# Patient Record
Sex: Female | Born: 1970 | Race: Black or African American | Hispanic: No | Marital: Single | Smoking: Never smoker
Health system: Southern US, Community
[De-identification: ages and names within clinical notes are randomized; demographics above are authoritative.]

## PROBLEM LIST (undated history)

## (undated) DIAGNOSIS — N1831 Chronic kidney disease, stage 3a: Secondary | ICD-10-CM

## (undated) DIAGNOSIS — Z973 Presence of spectacles and contact lenses: Secondary | ICD-10-CM

## (undated) DIAGNOSIS — I499 Cardiac arrhythmia, unspecified: Secondary | ICD-10-CM

## (undated) DIAGNOSIS — D573 Sickle-cell trait: Secondary | ICD-10-CM

## (undated) DIAGNOSIS — Z8489 Family history of other specified conditions: Secondary | ICD-10-CM

## (undated) DIAGNOSIS — R7303 Prediabetes: Secondary | ICD-10-CM

## (undated) DIAGNOSIS — E782 Mixed hyperlipidemia: Secondary | ICD-10-CM

## (undated) DIAGNOSIS — E669 Obesity, unspecified: Secondary | ICD-10-CM

## (undated) DIAGNOSIS — D649 Anemia, unspecified: Secondary | ICD-10-CM

## (undated) DIAGNOSIS — G4730 Sleep apnea, unspecified: Secondary | ICD-10-CM

## (undated) DIAGNOSIS — M7989 Other specified soft tissue disorders: Secondary | ICD-10-CM

## (undated) DIAGNOSIS — E559 Vitamin D deficiency, unspecified: Secondary | ICD-10-CM

## (undated) DIAGNOSIS — J309 Allergic rhinitis, unspecified: Secondary | ICD-10-CM

## (undated) DIAGNOSIS — M2550 Pain in unspecified joint: Secondary | ICD-10-CM

## (undated) DIAGNOSIS — L719 Rosacea, unspecified: Secondary | ICD-10-CM

## (undated) DIAGNOSIS — Z91018 Allergy to other foods: Secondary | ICD-10-CM

## (undated) DIAGNOSIS — L501 Idiopathic urticaria: Secondary | ICD-10-CM

## (undated) DIAGNOSIS — T7840XA Allergy, unspecified, initial encounter: Secondary | ICD-10-CM

## (undated) DIAGNOSIS — K760 Fatty (change of) liver, not elsewhere classified: Secondary | ICD-10-CM

## (undated) DIAGNOSIS — I4892 Unspecified atrial flutter: Secondary | ICD-10-CM

## (undated) DIAGNOSIS — B20 Human immunodeficiency virus [HIV] disease: Secondary | ICD-10-CM

## (undated) DIAGNOSIS — D571 Sickle-cell disease without crisis: Secondary | ICD-10-CM

## (undated) DIAGNOSIS — I471 Supraventricular tachycardia, unspecified: Secondary | ICD-10-CM

## (undated) DIAGNOSIS — M899 Disorder of bone, unspecified: Secondary | ICD-10-CM

## (undated) HISTORY — DX: Prediabetes: R73.03

## (undated) HISTORY — DX: Disorder of bone, unspecified: M89.9

## (undated) HISTORY — DX: Other specified soft tissue disorders: M79.89

## (undated) HISTORY — DX: Supraventricular tachycardia, unspecified: I47.10

## (undated) HISTORY — DX: Rosacea, unspecified: L71.9

## (undated) HISTORY — DX: Pain in unspecified joint: M25.50

## (undated) HISTORY — DX: Obesity, unspecified: E66.9

## (undated) HISTORY — DX: Allergy, unspecified, initial encounter: T78.40XA

## (undated) HISTORY — DX: Allergic rhinitis, unspecified: J30.9

## (undated) HISTORY — DX: Allergy to other foods: Z91.018

## (undated) HISTORY — DX: Sickle-cell disease without crisis: D57.1

## (undated) HISTORY — DX: Anemia, unspecified: D64.9

## (undated) HISTORY — DX: Vitamin D deficiency, unspecified: E55.9

## (undated) HISTORY — DX: Idiopathic urticaria: L50.1

## (undated) HISTORY — DX: Human immunodeficiency virus (HIV) disease: B20

## (undated) HISTORY — PX: APPENDECTOMY: SHX54

---

## 1999-06-29 ENCOUNTER — Other Ambulatory Visit: Admission: RE | Admit: 1999-06-29 | Discharge: 1999-06-29 | Payer: Self-pay | Admitting: *Deleted

## 2000-09-18 ENCOUNTER — Other Ambulatory Visit: Admission: RE | Admit: 2000-09-18 | Discharge: 2000-09-18 | Payer: Self-pay | Admitting: *Deleted

## 2002-03-09 ENCOUNTER — Other Ambulatory Visit: Admission: RE | Admit: 2002-03-09 | Discharge: 2002-03-09 | Payer: Self-pay | Admitting: *Deleted

## 2003-10-13 ENCOUNTER — Other Ambulatory Visit: Admission: RE | Admit: 2003-10-13 | Discharge: 2003-10-13 | Payer: Self-pay | Admitting: *Deleted

## 2004-08-08 ENCOUNTER — Other Ambulatory Visit: Admission: RE | Admit: 2004-08-08 | Discharge: 2004-08-08 | Payer: Self-pay | Admitting: Family Medicine

## 2004-08-14 ENCOUNTER — Encounter: Admission: RE | Admit: 2004-08-14 | Discharge: 2004-08-14 | Payer: Self-pay | Admitting: Family Medicine

## 2004-08-14 ENCOUNTER — Encounter (INDEPENDENT_AMBULATORY_CARE_PROVIDER_SITE_OTHER): Payer: Self-pay | Admitting: *Deleted

## 2004-08-14 LAB — CONVERTED CEMR LAB: CD4 T Cell Abs: 19

## 2004-08-23 ENCOUNTER — Encounter: Admission: RE | Admit: 2004-08-23 | Discharge: 2004-08-23 | Payer: Self-pay | Admitting: Internal Medicine

## 2004-08-31 ENCOUNTER — Ambulatory Visit: Payer: Self-pay | Admitting: Internal Medicine

## 2004-10-03 ENCOUNTER — Ambulatory Visit: Payer: Self-pay | Admitting: Internal Medicine

## 2004-10-16 ENCOUNTER — Ambulatory Visit: Payer: Self-pay | Admitting: Internal Medicine

## 2004-10-24 ENCOUNTER — Ambulatory Visit: Payer: Self-pay | Admitting: Internal Medicine

## 2004-10-30 ENCOUNTER — Ambulatory Visit: Payer: Self-pay | Admitting: Internal Medicine

## 2004-11-13 ENCOUNTER — Ambulatory Visit: Payer: Self-pay | Admitting: Internal Medicine

## 2004-12-05 ENCOUNTER — Ambulatory Visit: Payer: Self-pay | Admitting: Internal Medicine

## 2004-12-11 ENCOUNTER — Ambulatory Visit: Payer: Self-pay | Admitting: Internal Medicine

## 2004-12-31 DIAGNOSIS — B20 Human immunodeficiency virus [HIV] disease: Secondary | ICD-10-CM

## 2004-12-31 DIAGNOSIS — Z21 Asymptomatic human immunodeficiency virus [HIV] infection status: Secondary | ICD-10-CM

## 2004-12-31 HISTORY — DX: Human immunodeficiency virus (HIV) disease: B20

## 2004-12-31 HISTORY — DX: Asymptomatic human immunodeficiency virus (hiv) infection status: Z21

## 2005-02-05 ENCOUNTER — Ambulatory Visit: Payer: Self-pay | Admitting: Internal Medicine

## 2005-04-02 ENCOUNTER — Ambulatory Visit: Payer: Self-pay | Admitting: Internal Medicine

## 2005-05-08 ENCOUNTER — Ambulatory Visit: Payer: Self-pay | Admitting: Internal Medicine

## 2005-06-11 ENCOUNTER — Ambulatory Visit: Payer: Self-pay | Admitting: Internal Medicine

## 2005-09-11 ENCOUNTER — Ambulatory Visit: Payer: Self-pay | Admitting: Internal Medicine

## 2005-11-13 ENCOUNTER — Ambulatory Visit: Payer: Self-pay | Admitting: Internal Medicine

## 2005-11-21 ENCOUNTER — Other Ambulatory Visit: Admission: RE | Admit: 2005-11-21 | Discharge: 2005-11-21 | Payer: Self-pay | Admitting: Family Medicine

## 2005-12-10 ENCOUNTER — Ambulatory Visit: Payer: Self-pay | Admitting: Infectious Diseases

## 2006-03-04 ENCOUNTER — Ambulatory Visit: Payer: Self-pay | Admitting: Internal Medicine

## 2006-06-04 ENCOUNTER — Ambulatory Visit: Payer: Self-pay | Admitting: Internal Medicine

## 2006-07-08 ENCOUNTER — Ambulatory Visit: Payer: Self-pay | Admitting: Internal Medicine

## 2006-08-15 ENCOUNTER — Ambulatory Visit: Payer: Self-pay | Admitting: Internal Medicine

## 2006-08-15 ENCOUNTER — Encounter (INDEPENDENT_AMBULATORY_CARE_PROVIDER_SITE_OTHER): Payer: Self-pay | Admitting: *Deleted

## 2006-11-11 ENCOUNTER — Encounter (INDEPENDENT_AMBULATORY_CARE_PROVIDER_SITE_OTHER): Payer: Self-pay | Admitting: *Deleted

## 2006-11-11 ENCOUNTER — Ambulatory Visit: Payer: Self-pay | Admitting: Internal Medicine

## 2006-11-11 LAB — CONVERTED CEMR LAB
AST: 14 units/L (ref 0–37)
Alkaline Phosphatase: 95 units/L (ref 39–117)
Bilirubin, Direct: <0.1 mg/dL (ref 0.0–0.3)
Calcium: 8.7 mg/dL (ref 8.4–10.5)
Chloride: 108 meq/L (ref 96–112)
Creatinine, Ser: 1.01 mg/dL (ref 0.40–1.20)
HIV 1 RNA Quant: 49 copies/mL
LDL Cholesterol: 109 mg/dL — ABNORMAL HIGH (ref 0–99)
Potassium: 4.6 meq/L (ref 3.5–5.3)
Total Bilirubin: 0.3 mg/dL (ref 0.3–1.2)
Total CHOL/HDL Ratio: 5.6
Triglycerides: 235 mg/dL — ABNORMAL HIGH (ref <150)

## 2007-01-24 ENCOUNTER — Encounter (INDEPENDENT_AMBULATORY_CARE_PROVIDER_SITE_OTHER): Payer: Self-pay | Admitting: *Deleted

## 2007-01-24 ENCOUNTER — Ambulatory Visit: Payer: Self-pay | Admitting: Internal Medicine

## 2007-01-24 DIAGNOSIS — B20 Human immunodeficiency virus [HIV] disease: Secondary | ICD-10-CM | POA: Insufficient documentation

## 2007-01-24 DIAGNOSIS — B370 Candidal stomatitis: Secondary | ICD-10-CM | POA: Insufficient documentation

## 2007-01-24 LAB — CONVERTED CEMR LAB
ALT: 10 units/L (ref 0–35)
AST: 14 units/L (ref 0–37)
Bilirubin, Direct: 0.1 mg/dL (ref 0.0–0.3)
CD4 Count: 342 microliters
Chloride: 104 meq/L (ref 96–112)
Hemoglobin, Urine: NEGATIVE
Indirect Bilirubin: 0.3 mg/dL (ref 0.0–0.9)
Ketones, ur: NEGATIVE mg/dL
Leukocytes, UA: NEGATIVE
Nitrite: NEGATIVE
Phosphorus: 2.9 mg/dL (ref 2.3–4.6)
Sodium: 137 meq/L (ref 135–145)
Specific Gravity, Urine: 1.021 (ref 1.005–1.03)
Total CHOL/HDL Ratio: 5.1
Urine Glucose: NEGATIVE mg/dL
Urobilinogen, UA: 0.2 (ref 0.0–1.0)

## 2007-02-11 ENCOUNTER — Encounter: Payer: Self-pay | Admitting: Internal Medicine

## 2007-02-24 ENCOUNTER — Encounter (INDEPENDENT_AMBULATORY_CARE_PROVIDER_SITE_OTHER): Payer: Self-pay | Admitting: *Deleted

## 2007-02-24 LAB — CONVERTED CEMR LAB

## 2007-03-06 ENCOUNTER — Ambulatory Visit: Payer: Self-pay | Admitting: Internal Medicine

## 2007-03-06 DIAGNOSIS — L501 Idiopathic urticaria: Secondary | ICD-10-CM | POA: Insufficient documentation

## 2007-03-09 ENCOUNTER — Encounter (INDEPENDENT_AMBULATORY_CARE_PROVIDER_SITE_OTHER): Payer: Self-pay | Admitting: *Deleted

## 2007-04-28 ENCOUNTER — Ambulatory Visit: Payer: Self-pay | Admitting: Internal Medicine

## 2007-05-07 ENCOUNTER — Other Ambulatory Visit: Admission: RE | Admit: 2007-05-07 | Discharge: 2007-05-07 | Payer: Self-pay | Admitting: Family Medicine

## 2007-06-11 ENCOUNTER — Encounter: Payer: Self-pay | Admitting: Internal Medicine

## 2007-07-15 ENCOUNTER — Ambulatory Visit: Payer: Self-pay | Admitting: Internal Medicine

## 2007-09-29 ENCOUNTER — Telehealth (INDEPENDENT_AMBULATORY_CARE_PROVIDER_SITE_OTHER): Payer: Self-pay | Admitting: *Deleted

## 2007-10-02 ENCOUNTER — Ambulatory Visit: Payer: Self-pay | Admitting: Internal Medicine

## 2007-10-02 LAB — CONVERTED CEMR LAB

## 2007-10-03 ENCOUNTER — Encounter: Payer: Self-pay | Admitting: Internal Medicine

## 2007-10-15 ENCOUNTER — Encounter: Payer: Self-pay | Admitting: Internal Medicine

## 2007-10-15 LAB — CONVERTED CEMR LAB
CD4 Count: 380 microliters
HIV 1 RNA Quant: 49 copies/mL

## 2007-11-06 ENCOUNTER — Encounter (INDEPENDENT_AMBULATORY_CARE_PROVIDER_SITE_OTHER): Payer: Self-pay | Admitting: *Deleted

## 2007-11-06 ENCOUNTER — Ambulatory Visit: Payer: Self-pay | Admitting: Internal Medicine

## 2007-11-06 DIAGNOSIS — Z6841 Body Mass Index (BMI) 40.0 and over, adult: Secondary | ICD-10-CM | POA: Insufficient documentation

## 2007-11-06 DIAGNOSIS — E7849 Other hyperlipidemia: Secondary | ICD-10-CM | POA: Insufficient documentation

## 2007-11-06 DIAGNOSIS — E669 Obesity, unspecified: Secondary | ICD-10-CM

## 2007-11-06 DIAGNOSIS — E785 Hyperlipidemia, unspecified: Secondary | ICD-10-CM

## 2007-12-30 ENCOUNTER — Ambulatory Visit: Payer: Self-pay | Admitting: Internal Medicine

## 2007-12-30 ENCOUNTER — Encounter (INDEPENDENT_AMBULATORY_CARE_PROVIDER_SITE_OTHER): Payer: Self-pay | Admitting: *Deleted

## 2008-03-30 ENCOUNTER — Ambulatory Visit: Payer: Self-pay | Admitting: Internal Medicine

## 2008-05-04 ENCOUNTER — Ambulatory Visit: Payer: Self-pay | Admitting: Internal Medicine

## 2008-05-04 ENCOUNTER — Ambulatory Visit (HOSPITAL_COMMUNITY): Admission: RE | Admit: 2008-05-04 | Discharge: 2008-05-04 | Payer: Self-pay | Admitting: Internal Medicine

## 2008-05-04 DIAGNOSIS — M25559 Pain in unspecified hip: Secondary | ICD-10-CM | POA: Insufficient documentation

## 2008-05-10 ENCOUNTER — Telehealth: Payer: Self-pay | Admitting: Internal Medicine

## 2008-05-25 ENCOUNTER — Encounter: Payer: Self-pay | Admitting: Internal Medicine

## 2008-05-27 ENCOUNTER — Ambulatory Visit: Payer: Self-pay | Admitting: Internal Medicine

## 2008-05-31 ENCOUNTER — Ambulatory Visit: Payer: Self-pay | Admitting: Internal Medicine

## 2008-05-31 ENCOUNTER — Encounter: Payer: Self-pay | Admitting: Internal Medicine

## 2008-05-31 LAB — CONVERTED CEMR LAB: Pap Smear: NORMAL

## 2008-06-07 ENCOUNTER — Encounter: Payer: Self-pay | Admitting: Internal Medicine

## 2008-06-22 ENCOUNTER — Ambulatory Visit: Payer: Self-pay | Admitting: Internal Medicine

## 2008-09-08 ENCOUNTER — Ambulatory Visit: Payer: Self-pay | Admitting: Internal Medicine

## 2008-09-09 ENCOUNTER — Encounter: Payer: Self-pay | Admitting: Internal Medicine

## 2008-09-10 ENCOUNTER — Encounter: Payer: Self-pay | Admitting: Internal Medicine

## 2008-09-22 ENCOUNTER — Ambulatory Visit: Payer: Self-pay | Admitting: Internal Medicine

## 2008-09-22 LAB — CONVERTED CEMR LAB

## 2008-10-19 ENCOUNTER — Ambulatory Visit: Payer: Self-pay | Admitting: Internal Medicine

## 2008-12-16 ENCOUNTER — Telehealth: Payer: Self-pay | Admitting: Internal Medicine

## 2008-12-16 ENCOUNTER — Encounter: Payer: Self-pay | Admitting: Internal Medicine

## 2009-01-17 ENCOUNTER — Ambulatory Visit: Payer: Self-pay | Admitting: Internal Medicine

## 2009-01-17 LAB — CONVERTED CEMR LAB
Albumin: 4.2 g/dL (ref 3.5–5.2)
Alkaline Phosphatase: 141 units/L — ABNORMAL HIGH (ref 39–117)
BUN: 9 mg/dL (ref 6–23)
Basophils Absolute: 0 10*3/uL (ref 0.0–0.1)
Calcium: 8.8 mg/dL (ref 8.4–10.5)
Cholesterol: 171 mg/dL (ref 0–200)
Eosinophils Absolute: 0.1 10*3/uL (ref 0.0–0.7)
Eosinophils Relative: 1 % (ref 0–5)
HCT: 44.9 % (ref 36.0–46.0)
HDL: 45 mg/dL (ref >39)
Lymphocytes Relative: 29 % (ref 12–46)
Lymphs Abs: 2 10*3/uL (ref 0.7–4.0)
Neutro Abs: 4.3 10*3/uL (ref 1.7–7.7)
Sodium: 140 meq/L (ref 135–145)
Total Bilirubin: 0.2 mg/dL — ABNORMAL LOW (ref 0.3–1.2)
Total CHOL/HDL Ratio: 3.8
Total Protein: 7 g/dL (ref 6.0–8.3)

## 2009-04-12 ENCOUNTER — Ambulatory Visit: Payer: Self-pay | Admitting: Internal Medicine

## 2009-04-12 LAB — CONVERTED CEMR LAB
ALT: 33 units/L (ref 0–35)
BUN: 10 mg/dL (ref 6–23)
Cholesterol: 169 mg/dL (ref 0–200)
Creatinine, Ser: 0.97 mg/dL (ref 0.40–1.20)
Eosinophils Absolute: 0.1 10*3/uL (ref 0.0–0.7)
HCT: 39.4 % (ref 36.0–46.0)
HIV 1 RNA Quant: <48 copies/mL (ref <48)
HIV-1 RNA Quant, Log: <1.68 (ref <1.68)
Hemoglobin: 12.8 g/dL (ref 12.0–15.0)
Monocytes Relative: 11 % (ref 3–12)
Neutrophils Relative %: 63 % (ref 43–77)
Platelets: 279 10*3/uL (ref 150–400)
RBC: 4.16 M/uL (ref 3.87–5.11)
RDW: 13.5 % (ref 11.5–15.5)
Sodium: 139 meq/L (ref 135–145)
Total CHOL/HDL Ratio: 4.2
Triglycerides: 149 mg/dL (ref <150)

## 2009-06-20 ENCOUNTER — Ambulatory Visit: Payer: Self-pay | Admitting: Internal Medicine

## 2009-07-18 ENCOUNTER — Encounter: Payer: Self-pay | Admitting: Internal Medicine

## 2009-07-18 ENCOUNTER — Ambulatory Visit: Payer: Self-pay | Admitting: Infectious Diseases

## 2009-07-28 ENCOUNTER — Encounter: Payer: Self-pay | Admitting: Internal Medicine

## 2009-08-04 ENCOUNTER — Encounter (INDEPENDENT_AMBULATORY_CARE_PROVIDER_SITE_OTHER): Payer: Self-pay | Admitting: *Deleted

## 2009-08-04 ENCOUNTER — Ambulatory Visit: Payer: Self-pay | Admitting: Internal Medicine

## 2009-08-15 ENCOUNTER — Encounter: Payer: Self-pay | Admitting: Internal Medicine

## 2009-10-11 ENCOUNTER — Ambulatory Visit: Payer: Self-pay | Admitting: Internal Medicine

## 2009-10-17 ENCOUNTER — Telehealth: Payer: Self-pay | Admitting: Internal Medicine

## 2009-12-06 ENCOUNTER — Ambulatory Visit: Payer: Self-pay | Admitting: Internal Medicine

## 2009-12-06 LAB — CONVERTED CEMR LAB
ALT: 24 units/L (ref 0–35)
Basophils Absolute: 0 10*3/uL (ref 0.0–0.1)
Calcium: 8.5 mg/dL (ref 8.4–10.5)
Cholesterol: 183 mg/dL (ref 0–200)
Creatinine, Ser: 0.77 mg/dL (ref 0.40–1.20)
Eosinophils Relative: 1 % (ref 0–5)
Lymphs Abs: 1.7 10*3/uL (ref 0.7–4.0)
MCHC: 32.8 g/dL (ref 30.0–36.0)
MCV: 96.2 fL (ref >=78.0)
Monocytes Relative: 9 % (ref 3–12)
Neutrophils Relative %: 65 % (ref 43–77)
Platelets: 306 10*3/uL (ref 150–400)
RDW: 13.8 % (ref 11.5–15.5)
Total CHOL/HDL Ratio: 4.6
Total Protein: 6.9 g/dL (ref 6.0–8.3)
WBC: 6.8 10*3/uL (ref 4.0–10.5)

## 2010-01-06 ENCOUNTER — Encounter: Payer: Self-pay | Admitting: Internal Medicine

## 2010-03-01 ENCOUNTER — Encounter: Payer: Self-pay | Admitting: Internal Medicine

## 2010-03-01 ENCOUNTER — Ambulatory Visit: Payer: Self-pay | Admitting: Internal Medicine

## 2010-03-01 LAB — CONVERTED CEMR LAB
ALT: 21 units/L (ref 0–35)
AST: 27 units/L (ref 0–37)
Albumin: 3.8 g/dL (ref 3.5–5.2)
BUN: 7 mg/dL (ref 6–23)
CD4 Count: 406 microliters
CO2: 24 meq/L (ref 19–32)
Calcium: 8.4 mg/dL (ref 8.4–10.5)
Creatinine, Urine: 230.2 mg/dL
Glucose, Bld: 92 mg/dL (ref 70–99)
HDL: 43 mg/dL (ref >39)
HIV 1 RNA Quant: 39 copies/mL
Hep B Core Total Ab: NEGATIVE
Hepatitis B Surface Ag: NEGATIVE
Sodium: 137 meq/L (ref 135–145)
Total Bilirubin: 0.3 mg/dL (ref 0.3–1.2)
VLDL: 25 mg/dL (ref 0–40)

## 2010-04-10 ENCOUNTER — Ambulatory Visit: Payer: Self-pay | Admitting: Internal Medicine

## 2010-07-24 ENCOUNTER — Ambulatory Visit: Payer: Self-pay | Admitting: Internal Medicine

## 2010-07-24 LAB — CONVERTED CEMR LAB
BUN: 9 mg/dL (ref 6–23)
CD4 Count: 468 microliters
Calcium: 8.7 mg/dL (ref 8.4–10.5)
Cholesterol: 177 mg/dL (ref 0–200)
HDL: 54 mg/dL (ref >39)
HIV 1 RNA Quant: 39 copies/mL
Total Bilirubin: 0.2 mg/dL — ABNORMAL LOW (ref 0.3–1.2)
Total CHOL/HDL Ratio: 3.3
Triglycerides: 100 mg/dL (ref <150)
VLDL: 20 mg/dL (ref 0–40)

## 2010-10-24 ENCOUNTER — Ambulatory Visit: Payer: Self-pay | Admitting: Internal Medicine

## 2010-10-24 DIAGNOSIS — N3941 Urge incontinence: Secondary | ICD-10-CM | POA: Insufficient documentation

## 2010-11-03 ENCOUNTER — Ambulatory Visit: Payer: Self-pay | Admitting: Internal Medicine

## 2010-11-03 LAB — CONVERTED CEMR LAB: Pap Smear: NEGATIVE

## 2010-11-10 ENCOUNTER — Encounter: Payer: Self-pay | Admitting: Internal Medicine

## 2010-12-21 ENCOUNTER — Encounter: Payer: Self-pay | Admitting: Internal Medicine

## 2010-12-21 ENCOUNTER — Ambulatory Visit: Payer: Self-pay | Admitting: Internal Medicine

## 2010-12-21 LAB — CONVERTED CEMR LAB
AST: 25 units/L (ref 0–37)
BUN: 7 mg/dL (ref 6–23)
Calcium: 8.2 mg/dL — ABNORMAL LOW (ref 8.4–10.5)
Chloride: 106 meq/L (ref 96–112)
Creatinine, Ser: 0.69 mg/dL (ref 0.40–1.20)
Eosinophils Relative: 1 % (ref 0–5)
HCT: 38 % (ref 36.0–46.0)
Hemoglobin: 12.2 g/dL (ref 12.0–15.0)
Lymphocytes Relative: 28 % (ref 12–46)
Lymphs Abs: 1.5 10*3/uL (ref 0.7–4.0)
Monocytes Absolute: 0.6 10*3/uL (ref 0.1–1.0)
RDW: 14.2 % (ref 11.5–15.5)
Total CHOL/HDL Ratio: 3.4
VLDL: 14 mg/dL (ref 0–40)
WBC: 5.5 10*3/uL (ref 4.0–10.5)

## 2011-01-28 LAB — CONVERTED CEMR LAB
ALT: 9 units/L (ref 0–35)
ALT: 9 units/L (ref 0–35)
AST: 13 units/L (ref 0–37)
AST: 13 units/L (ref 0–37)
AST: 16 units/L (ref 0–37)
Albumin: 4 g/dL (ref 3.5–5.2)
Albumin: 4 g/dL (ref 3.5–5.2)
Albumin: 4.1 g/dL (ref 3.5–5.2)
Alkaline Phosphatase: 152 units/L — ABNORMAL HIGH (ref 39–117)
Alkaline Phosphatase: 93 units/L (ref 39–117)
BUN: 7 mg/dL (ref 6–23)
BUN: 7 mg/dL (ref 6–23)
BUN: 7 mg/dL (ref 6–23)
Bilirubin Urine: NEGATIVE
Bilirubin, Direct: 0.1 mg/dL (ref 0.0–0.3)
Bilirubin, Direct: 0.1 mg/dL (ref 0.0–0.3)
Bilirubin, Direct: <0.1 mg/dL (ref 0.0–0.3)
CD4 Count: 335 microliters
CD4 Count: 346 microliters
CD4 Count: 370 microliters
CD4 Count: 435 microliters
CO2: 21 meq/L (ref 19–32)
CO2: 21 meq/L (ref 19–32)
CO2: 22 meq/L (ref 19–32)
Calcium: 8.8 mg/dL (ref 8.4–10.5)
Calcium: 8.8 mg/dL (ref 8.4–10.5)
Calcium: 8.9 mg/dL (ref 8.4–10.5)
Calcium: 9 mg/dL (ref 8.4–10.5)
Chloride: 106 meq/L (ref 96–112)
Chloride: 106 meq/L (ref 96–112)
Chloride: 107 meq/L (ref 96–112)
Chloride: 108 meq/L (ref 96–112)
Cholesterol: 167 mg/dL (ref 0–200)
Cholesterol: 194 mg/dL (ref 0–200)
Cholesterol: 196 mg/dL (ref 0–200)
Cholesterol: 226 mg/dL — ABNORMAL HIGH (ref 0–200)
Creatinine, Ser: 0.89 mg/dL (ref 0.40–1.20)
Creatinine, Ser: 0.89 mg/dL (ref 0.40–1.20)
Creatinine, Urine: 151 mg/dL
GFR calc Af Amer: >60 mL/min (ref >60)
Glucose, Bld: 103 mg/dL — ABNORMAL HIGH (ref 70–99)
Glucose, Bld: 89 mg/dL (ref 70–99)
Glucose, Bld: 93 mg/dL (ref 70–99)
Glucose, Bld: 95 mg/dL (ref 70–99)
Glucose, Bld: 97 mg/dL (ref 70–99)
HDL: 31 mg/dL — ABNORMAL LOW (ref >39)
HDL: 35 mg/dL — ABNORMAL LOW (ref >39)
HDL: 37 mg/dL — ABNORMAL LOW (ref >39)
HDL: 38 mg/dL — ABNORMAL LOW (ref >39)
HDL: 40 mg/dL (ref >39)
HDL: 51 mg/dL (ref >39)
HIV 1 RNA Quant: 49 copies/mL
HIV 1 RNA Quant: 49 copies/mL
HIV 1 RNA Quant: 49 copies/mL
HIV 1 RNA Quant: 49 copies/mL
Hep A Total Ab: NEGATIVE
Hep B Core Total Ab: NEGATIVE
Indirect Bilirubin: 0.2 mg/dL (ref 0.0–0.9)
Indirect Bilirubin: 0.3 mg/dL (ref 0.0–0.9)
Ketones, ur: NEGATIVE mg/dL
Ketones, ur: NEGATIVE mg/dL
LDL Cholesterol: 114 mg/dL — ABNORMAL HIGH (ref 0–99)
LDL Cholesterol: 140 mg/dL — ABNORMAL HIGH (ref 0–99)
LDL Cholesterol: 142 mg/dL — ABNORMAL HIGH (ref 0–99)
Leukocytes, UA: NEGATIVE
Leukocytes, UA: NEGATIVE
Nitrite: NEGATIVE
Nitrite: NEGATIVE
Potassium: 4.2 meq/L (ref 3.5–5.3)
Potassium: 4.2 meq/L (ref 3.5–5.3)
Potassium: 4.6 meq/L (ref 3.5–5.3)
Protein, ur: 30 mg/dL — AB
Protein, ur: NEGATIVE mg/dL
Protein, ur: NEGATIVE mg/dL
RBC / HPF: NONE SEEN (ref <3)
Sodium: 139 meq/L (ref 135–145)
Specific Gravity, Urine: 1.025 (ref 1.005–1.03)
Total Bilirubin: 0.2 mg/dL — ABNORMAL LOW (ref 0.3–1.2)
Total Bilirubin: 0.3 mg/dL (ref 0.3–1.2)
Total Bilirubin: 0.3 mg/dL (ref 0.3–1.2)
Total Bilirubin: 0.3 mg/dL (ref 0.3–1.2)
Total Bilirubin: 0.4 mg/dL (ref 0.3–1.2)
Total Bilirubin: 0.4 mg/dL (ref 0.3–1.2)
Total CHOL/HDL Ratio: 5.2
Total CHOL/HDL Ratio: 6.1
Total Protein, Urine: 9
Total Protein: 6.4 g/dL (ref 6.0–8.3)
Total Protein: 6.8 g/dL (ref 6.0–8.3)
Total Protein: 7.1 g/dL (ref 6.0–8.3)
Triglycerides: 275 mg/dL — ABNORMAL HIGH (ref <150)
Triglycerides: 327 mg/dL — ABNORMAL HIGH (ref <150)
Urine Glucose: NEGATIVE mg/dL
Urine Glucose: NEGATIVE mg/dL
Urobilinogen, UA: 0.2 (ref 0.0–1.0)
VLDL: 24 mg/dL (ref 0–40)
VLDL: 31 mg/dL (ref 0–40)
pH: 6 (ref 5.0–8.0)

## 2011-02-01 NOTE — Miscellaneous (Signed)
Summary: Appointment Canceled  Appointment status changed to canceled by [employer] on 04/03/2010 8:57 AM.  Cancellation Comments --------------------- F/U APPT/VS  Appointment Information ----------------------- Appt Type:  ID OFFICE VISIT      Date:  Tuesday, April 04, 2010      Time:  3:15 PM for 15 min   Urgency:  Routine   Made By:  Hoy Finlay Scheduler  To Visit:  Margaret Porter    Reason:  F/U APPT/VS  Appt Comments ------------- -- 04/03/10 8:57: ([facility]) CANCELED -- F/U APPT/VS -- 03/24/10 10:58: ([facility]) BOOKED -- Routine ID OFFICE VISIT at 04/04/2010 3:15 PM for 15 min F/U APPT/VS

## 2011-02-01 NOTE — Letter (Signed)
Summary: Results Follow-up Letter  [facility]  [address]   [place], [place] [address]   Phone: [phone]  Fax: [phone]          November 10, 2010  [address] [place], [place]  [address]  Dear Margaret Porter,   The following are the results of your recent test(s):  Test     Result     Pap Smear    Normal_XXX___  Not Normal_____  Comments:  Everything was normal.  I will see you in one year for your next PAP smear.  Thank you for coming to the Center for your care.  Happy Holidays!  Sincerely,    Jennet Maduro [facility]

## 2011-02-01 NOTE — Miscellaneous (Signed)
Summary: HIV-1 RNA, CD4 (RESEARCH)  Clinical Lists Changes  Observations: Added new observation of CD4 COUNT: 406 microliters (03/01/2010 14:18) Added new observation of HIV1RNA QA: 39 copies/mL (03/01/2010 14:18)

## 2011-02-01 NOTE — Assessment & Plan Note (Signed)
   Process Orders Check Orders Results:     [facility]: ABN not required for this insurance Tests Sent for requisitioning (March 01, 2010 9:35 AM):     03/01/2010: [facility] -- T-RPR (Syphilis) [phone] (signed)

## 2011-02-01 NOTE — Miscellaneous (Signed)
Summary: HIV-1 RNA, CD4 (RESEARCH)  Clinical Lists Changes  Observations: Added new observation of CD4 COUNT: 468 microliters (07/24/2010 15:37) Added new observation of HIV1RNA QA: 39 copies/mL (07/24/2010 15:37)

## 2011-02-01 NOTE — Assessment & Plan Note (Signed)
Summary: STUDY APPT/ LH    Current Allergies: ! BACTRIM DS (SULFAMETHOXAZOLE-TRIMETHOPRIM) ! AMOXICILLIN (AMOXICILLIN) ! PEPPERMINT (PEPPERMINT OIL) ! * HONEY ! * GRASS ! * DUST Vital Signs:  Patient profile:   41 year old female Menstrual status:  regular Height:      63.5 inches (161.29 cm) Weight:      228.3 pounds (103.77 kg) BMI:     39.95 Temp:     96.5 degrees F oral Pulse rate:   74 / minute BP sitting:   126 / 82  (right arm) Is Patient Diabetic? No Pain Assessment Patient in pain? no      Nutritional Status BMI of > 30 = obese  Does patient need assistance? Functional Status Self care Ambulation Normal   Patient here for week 288 ALLRT study visit. She is getting over a "cold" that started a week ago, but otherwise hasn't had any other problems. She is to schedule an appt with Dr. Orvan Falconer for next month.Margaret Evener RN  March 01, 2010 9:35 AM    Other Orders: Est. Patient Research Study [id number]) T-Comprehensive Metabolic Panel [id number]) T-Lipid Profile [id number]) T-Urine Protein [id number]) T-Urine Creatinine [id number]) T-Hepatitis A Antibody ([id number]) T-Hepatitis C Antibody ([id number]) T-Hepatitis B Core Antibody ([id number]) T-Hepatitis B Surface Antigen ([id number]) Process Orders Check Orders Results:     [facility]: ABN not required for this insurance Tests Sent for requisitioning (March 01, 2010 9:32 AM):     03/01/2010: [facility] -- T-Comprehensive Metabolic Panel [[id number]] (signed)     03/01/2010: [facility] -- T-Lipid Profile [phone] (signed)     03/01/2010: [facility] -- T-Urine Protein [phone] (signed)     03/01/2010: [facility] -- T-Urine Creatinine [[id number]] (signed)     03/01/2010: [facility] -- T-Hepatitis A Antibody [[id number]] (signed)     03/01/2010: [facility] -- T-Hepatitis  C Antibody [[id number]] (signed)     03/01/2010: [facility] -- T-Hepatitis B Core Antibody [[id number]] (signed)     03/01/2010: [facility] -- T-Hepatitis B Surface Antigen [[id number]] (signed)

## 2011-02-01 NOTE — Miscellaneous (Signed)
Summary:  CD4 (RESEARCH)  Clinical Lists Changes  Observations: Added new observation of CD4 COUNT: 695 microliters (01/06/2010 10:12)

## 2011-02-01 NOTE — Assessment & Plan Note (Signed)
Summary: F/U APPT/VS   CC:  follow-up visit.  History of Present Illness: Margaret Porter is in for routine visit.  She has not missed any doses of her Atripla  hand is feeling well without any new problems or concerns.  Preventive Screening-Counseling & Management  Alcohol-Tobacco     Alcohol drinks/day: <1     Alcohol type: all     Smoking Status: never     Passive Smoke Exposure: no  Caffeine-Diet-Exercise     Caffeine use/day: n     Does Patient Exercise: yes     Type of exercise: walking     Exercise (avg: min/session): >60     Times/week: 5  Hep-HIV-STD-Contraception     HIV Risk: no risk noted  Safety-Violence-Falls     Seat Belt Use: yes  Comments: given condoms      Sexual History:  currently monogamous.        Drug Use:  never.     Prior Medication List:  ATRIPLA 600-200-300 MG TABS (EFAVIRENZ-EMTRICITAB-TENOFOVIR) Take 1 tablet by mouth once a day ZYRTEC  TABS (CETIRIZINE HCL TABS) Take 1 tablet by mouth once a day as needed for itching   Current Allergies: ! BACTRIM DS (SULFAMETHOXAZOLE-TRIMETHOPRIM) ! AMOXICILLIN (AMOXICILLIN) ! PEPPERMINT (PEPPERMINT OIL) ! * HONEY ! * GRASS ! * DUST Vital Signs:  Patient profile:   41 year old female Menstrual status:  regular LMP:     04/09/2010 Height:      63.5 inches (161.29 cm) Weight:      230.5 pounds (104.77 kg) BMI:     40.34 Temp:     97.7 degrees F (36.50 degrees C) oral Pulse rate:   78 / minute BP sitting:   127 / 89  (left arm) Cuff size:   large  Vitals Entered By: Jennet Maduro RN (April 10, 2010 3:25 PM) CC: follow-up visit Is Patient Diabetic? No Pain Assessment Patient in pain? 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 Functional Status Self care Ambulation Normal Comments no missed doses of rxes LMP (date): 04/09/2010 LMP - Character: normal     Enter LMP: 04/09/2010 Last PAP Result NEGATIVE FOR INTRAEPITHELIAL LESIONS OR MALIGNANCY.   Physical Exam  General:  alert and overweight-appearing.   Mouth:  good dentition, pharynx pink and moist, no erythema, and no exudates.   Lungs:  normal breath sounds.  no crackles and no wheezes.   Heart:  normal rate, regular rhythm, and no murmur.   Psych:  normally interactive, good eye contact, not anxious appearing, and not depressed appearing.          Medication Adherence: 04/10/2010   Adherence to medications reviewed with patient. Counseling to provide adequate adherence provided   Prevention For Positives: 04/10/2010   Safe sex practices discussed with patient. Condoms offered.                             Impression & Recommendations:  Problem # 1:  HIV DISEASE (ICD-042) Her HIV infection remains under excellent control due to her adherence.  I will not make any changes today. Diagnostics Reviewed:  HIV: CDC-defined AIDS (10/19/2008)   CD4:  406 (03/01/2010)   WBC: 6.8 (12/06/2009)   Hgb: 13.3 (12/06/2009)   HCT: 40.5 (12/06/2009)   Platelets: 306 (12/06/2009) HIV-1 RNA: 39 (03/01/2010)   HBSAg: NEG (03/01/2010)  Other Orders: Est. Patient Level III (04540) Future Orders: T-CD4SP ([facility]) (CD4SP) ... 10/07/2010 T-HIV Viral Load [id number]) ... 10/07/2010  Patient Instructions: 1)  Please schedule a follow-up appointment in 6 months.         Medication Adherence: 04/10/2010   Adherence to medications reviewed with patient. Counseling to provide adequate adherence provided    Prevention For Positives: 04/10/2010   Safe sex practices discussed with patient. Condoms offered.

## 2011-02-01 NOTE — Assessment & Plan Note (Signed)
Summary: STUDY APPT/ LH    Current Allergies: ! BACTRIM DS (SULFAMETHOXAZOLE-TRIMETHOPRIM) ! AMOXICILLIN (AMOXICILLIN) ! PEPPERMINT (PEPPERMINT OIL) ! * HONEY ! * GRASS ! * DUST Vital Signs:  Patient profile:   41 year old female Menstrual status:  regular Weight:      234.75 pounds (106.70 kg) BMI:     41.08 Temp:     98.5 degrees F oral Pulse rate:   81 / minute BP sitting:   116 / 79  (left arm) Is Patient Diabetic? No Pain Assessment Patient in pain? no      Nutritional Status BMI of > 30 = obese  Does patient need assistance? Functional Status Self care Ambulation Normal   Patient here for week 304 ALLRT study visit. Denies any new problems or complaints. Continues to do well with adherence. Will return for study in December.Deirdre Evener RN  July 24, 2010 10:45 AM    Other Orders: Est. Patient Research Study [id number]) T-Comprehensive Metabolic Panel [id number]) T-Lipid Profile ([id number])  Process Orders Check Orders Results:     [facility]: ABN not required for this insurance Order queued for requisitioning for [facility]: July 24, 2010 10:20 AM  Tests Sent for requisitioning (July 24, 2010 10:20 AM):     07/24/2010: [facility] -- T-Comprehensive Metabolic Panel [[id number]] (signed)     07/24/2010: [facility] -- T-Lipid Profile [phone] (signed)

## 2011-02-01 NOTE — Assessment & Plan Note (Signed)
Summary: STUDY APPT/ LH    Current Allergies: ! BACTRIM DS (SULFAMETHOXAZOLE-TRIMETHOPRIM) ! AMOXICILLIN (AMOXICILLIN) ! PEPPERMINT (PEPPERMINT OIL) ! * HONEY ! * GRASS ! * DUST Vital Signs:  Patient profile:   41 year old female Menstrual status:  regular Weight:      230.5 pounds (104.77 kg) BMI:     40.34 Temp:     98.5 degrees F oral Pulse rate:   63 / minute BP sitting:   120 / 75  (right arm) Is Patient Diabetic? No Pain Assessment Patient in pain? no      Nutritional Status BMI of > 30 = obese  Does patient need assistance? Functional Status Self care Ambulation Normal   Patient here for week 320 ALLRT study visit. She denies any new problems or concerns. She enrolled in the A5276 substudy looking at residual viremia. She qualified for the study because she has been consistently suppressed on therapy ever since she started meds and had a VL <1 at week 192. She will return for the next visit in April.Margaret Evener RN  December 21, 2010 10:38 AM    Other Orders: Est. Patient Research Study [id number]) T-CBC w/Diff [id number]) T-Lipid Profile [id number]) T-Comprehensive Metabolic Panel [id number])

## 2011-02-01 NOTE — Assessment & Plan Note (Signed)
Summary: PAP SMEAR visit   Vital Signs:  Patient profile:   41 year old female Menstrual status:  regular LMP:     10/28/2010  Vitals Entered By: Jennet Maduro RN (November 03, 2010 3:55 PM) CC: PAP smear visit.  Pt. given condoms.  Pt. given educational materials re:  HIV and women, diet , exercise, nutrition, self-esteem, BSE and mammograms and Depression.  Pt. reminded to call for appt. for Mammogram.  Strong family hx of Breast CA.   LMP (date): 10/28/2010 LMP - Character: normal     Menstrual Status regular Enter LMP: 10/28/2010 Last PAP Result NEGATIVE FOR INTRAEPITHELIAL LESIONS OR MALIGNANCY.   Preventive Screening-Counseling & Management  Alcohol-Tobacco     Alcohol drinks/day: <1     Alcohol type: all     Smoking Status: never     Passive Smoke Exposure: no Passive cigarette smoke exposure: no  Behavioral Health Assessment Frequency: <1  Evaluation and Follow-Up  Prevention For Positives: 11/03/2010   Safe sex practices discussed with patient. Condoms offered. Prior Medications: ATRIPLA 600-200-300 MG TABS (EFAVIRENZ-EMTRICITAB-TENOFOVIR) Take 1 tablet by mouth once a day ZYRTEC  TABS (CETIRIZINE HCL TABS) Take 1 tablet by mouth once a day as needed for itching Current Allergies: ! BACTRIM DS (SULFAMETHOXAZOLE-TRIMETHOPRIM) ! AMOXICILLIN (AMOXICILLIN) ! PEPPERMINT (PEPPERMINT OIL) ! * HONEY ! * GRASS ! * DUST Orders Added: 1)  T-PAP [facility]) [88142] 2)  Est. Patient Level I [40981]           Prevention For Positives: 11/03/2010   Safe sex practices discussed with patient. Condoms offered.        11/03/2010   Patient was screened for substance abuse and depression. Referal was made as indicated.                       Depression History:      The patient denies a depressed mood most of the day and a diminished interest in her usual daily activities.

## 2011-02-01 NOTE — Assessment & Plan Note (Signed)
Summary: F/U [MKJ]   CC:  follow-up visit.  History of Present Illness: Margaret Porter  is in for her routine visit. She recalls missing only one dose of her Atripla since her last visit.  This occurred when she was one day late getting to the pharmacy for refills.  Her only recent problem has been urinary urgency and occasional incontinence.  She will notice this approximately once daily.  There is no particular time of day.  She does not have any dysuria or nocturia she does not describe any stress urinary incontinence.  This has been going on for several months.  Recently she has tried to go to the bathroom more frequently and to avoid problems with incontinence.  She is sexual active with one monogamous female partner always uses condoms.  Preventive Screening-Counseling & Management  Alcohol-Tobacco     Alcohol drinks/day: <1     Alcohol type: all     Smoking Status: never     Passive Smoke Exposure: no  Caffeine-Diet-Exercise     Caffeine use/day: no     Does Patient Exercise: yes     Type of exercise: walking     Exercise (avg: min/session): >60     Times/week: 5  Hep-HIV-STD-Contraception     HIV Risk: no risk noted  Safety-Violence-Falls     Seat Belt Use: yes  Comments: condoms decined      Sexual History:  n/a.        Drug Use:  never.     Prior Medication List:  ATRIPLA 600-200-300 MG TABS (EFAVIRENZ-EMTRICITAB-TENOFOVIR) Take 1 tablet by mouth once a day ZYRTEC  TABS (CETIRIZINE HCL TABS) Take 1 tablet by mouth once a day as needed for itching   Current Allergies (reviewed today): ! BACTRIM DS (SULFAMETHOXAZOLE-TRIMETHOPRIM) ! AMOXICILLIN (AMOXICILLIN) ! PEPPERMINT (PEPPERMINT OIL) ! * HONEY ! * GRASS ! * DUST Social History: Sexual History:  n/a  Vital Signs:  Patient profile:   41 year old female Menstrual status:  regular LMP:     09/25/2010 Height:      63.5 inches (161.29 cm) Weight:      233.8 pounds (106.27 kg) BMI:     40.91 Temp:      97.8 degrees F (36.56 degrees C) oral Pulse rate:   76 / minute BP sitting:   127 / 85  (left arm) Cuff size:   large  Vitals Entered By: Jennet Maduro RN (October 24, 2010 9:38 AM) CC: follow-up visit Is Patient Diabetic? No Pain Assessment Patient in pain? no      Nutritional Status BMI of > 30 = obese Nutritional Status Detail appetite "good"  Have you ever been in a relationship where you felt threatened, hurt or afraid?No   Does patient need assistance? Functional Status Self care Ambulation Normal Comments no missed doses LMP (date): 09/25/2010 LMP - Character: normal     Menstrual Status regular Enter LMP: 09/25/2010 Last PAP Result NEGATIVE FOR INTRAEPITHELIAL LESIONS OR MALIGNANCY.   Physical Exam  General:  alert and overweight-appearing.   Mouth:  good dentition, pharynx pink and moist, no erythema, and no exudates.   Lungs:  normal breath sounds.  no crackles and no wheezes.   Heart:  normal rate, regular rhythm, and no murmur.   Abdomen:  soft, non-tender, and normal bowel sounds.  no suprapubic fullness or tenderness.    Impression & Recommendations:  Problem # 1:  HIV DISEASE (ICD-042) Her HIV infection remains under excellent control.  I will not  make any changes today. Diagnostics Reviewed:  HIV: CDC-defined AIDS (10/19/2008)   CD4: 468 (07/24/2010)   WBC: 6.8 (12/06/2009)   Hgb: 13.3 (12/06/2009)   HCT: 40.5 (12/06/2009)   Platelets: 306 (12/06/2009) HIV-1 RNA: 39 (07/24/2010)   HBSAg: NEG (03/01/2010)  Problem # 2:  URINARY INCONTINENCE, URGE (ICD-788.31) I will obtain a urine specimen for urinalysis today and consider urology referral. Orders: Est. Patient Level IV (91478) T-Urinalysis Dipstick only ([id number])  Other Orders: Future Orders: T-CD4SP ([facility]) (CD4SP) ... 04/22/2011 T-HIV Viral Load [id number]) ... 04/22/2011 T-Comprehensive Metabolic Panel [id number]) ... 04/22/2011 T-CBC w/Diff ([id number]) ... 04/22/2011 T-RPR  (Syphilis) [id number]) ... 04/22/2011 T-Lipid Profile [id number]) ... 04/22/2011  Patient Instructions: 1)  Please schedule a follow-up appointment in 6 months. 2)  Please schedule a visit for the Pap smear clinic.  Appended Document: flu vaccine   Influenza Vaccine    Vaccine Type: Fluvax Non-MCR    Site: left deltoid    Mfr: novartis    Dose: 0.5 ml    Route: IM    Given by: Jennet Maduro RN    Exp. Date: 04/01/2011    Lot #: [id number]  Flu Vaccine Consent Questions    Do you have a history of severe allergic reactions to this vaccine? no    Any prior history of allergic reactions to egg and/or gelatin? no    Do you have a sensitivity to the preservative Thimersol? no    Do you have a past history of Guillan-Barre Syndrome? no    Do you currently have an acute febrile illness? no    Have you ever had a severe reaction to latex? no    Vaccine information given and explained to patient? yes    Are you currently pregnant? no   Appended Document: F/U [MKJ]  Laboratory Results   Urine Tests  Date/Time Received: Mariea Clonts  October 24, 2010 10:48 AM  Date/Time Reported: Mariea Clonts  October 24, 2010 10:48 AM   Routine Urinalysis   Color: yellow Appearance: Hazy Glucose: negative   (Normal Range: Negative) Bilirubin: negative   (Normal Range: Negative) Ketone: negative   (Normal Range: Negative) Spec. Gravity: >=1.030   (Normal Range: 1.003-1.035) Blood: negative   (Normal Range: Negative) pH: 5.5   (Normal Range: 5.0-8.0) Protein: negative   (Normal Range: Negative) Urobilinogen: 0.2   (Normal Range: 0-1) Nitrite: negative   (Normal Range: Negative) Leukocyte Esterace: negative   (Normal Range: Negative)

## 2011-04-09 ENCOUNTER — Ambulatory Visit (INDEPENDENT_AMBULATORY_CARE_PROVIDER_SITE_OTHER): Payer: Self-pay | Admitting: *Deleted

## 2011-04-09 ENCOUNTER — Ambulatory Visit: Payer: Self-pay | Admitting: Internal Medicine

## 2011-04-09 VITALS — BP 124/87 | HR 79 | Temp 98.5°F | Wt 236.5 lb

## 2011-04-09 DIAGNOSIS — B20 Human immunodeficiency virus [HIV] disease: Secondary | ICD-10-CM

## 2011-04-09 LAB — COMPREHENSIVE METABOLIC PANEL
ALT: 24 U/L (ref 0–35)
AST: 31 U/L (ref 0–37)
Albumin: 4 g/dL (ref 3.5–5.2)
Alkaline Phosphatase: 186 U/L — ABNORMAL HIGH (ref 39–117)
Calcium: 8.8 mg/dL (ref 8.4–10.5)
Chloride: 105 mEq/L (ref 96–112)
Potassium: 5.2 mEq/L (ref 3.5–5.3)
Sodium: 137 mEq/L (ref 135–145)
Total Protein: 6.7 g/dL (ref 6.0–8.3)

## 2011-04-09 LAB — LIPID PANEL
Cholesterol: 161 mg/dL (ref 0–200)
LDL Cholesterol: 91 mg/dL (ref 0–99)
VLDL: 13 mg/dL (ref 0–40)

## 2011-04-09 NOTE — Progress Notes (Signed)
04/09/2011 @ 1000: Pt here for ACTG Study A5001/Week 336: Pt denies any new findings. Assessment unchanged since last study visit. Stated she has had a PAP and Mammogram since last study visit. Pt to bring in copy of Mammogram visit. Labs were drawn with no problems. Pt received $20 gift card for study visit. Next appointment scheduled for Wednesday, June 27, 2011 at 9:00 am. Tacey Heap RN

## 2011-04-24 ENCOUNTER — Ambulatory Visit (INDEPENDENT_AMBULATORY_CARE_PROVIDER_SITE_OTHER): Payer: BC Managed Care – PPO | Admitting: Internal Medicine

## 2011-04-24 ENCOUNTER — Encounter: Payer: Self-pay | Admitting: Internal Medicine

## 2011-04-24 ENCOUNTER — Ambulatory Visit
Admission: RE | Admit: 2011-04-24 | Discharge: 2011-04-24 | Disposition: A | Discharge status: Home / Self Care | Payer: BC Managed Care – PPO | Source: Ambulatory Visit | Attending: Internal Medicine | Admitting: Internal Medicine

## 2011-04-24 VITALS — BP 155/104 | HR 94 | Temp 97.4°F | Ht 63.5 in | Wt 233.0 lb

## 2011-04-24 DIAGNOSIS — J209 Acute bronchitis, unspecified: Secondary | ICD-10-CM | POA: Insufficient documentation

## 2011-04-24 DIAGNOSIS — J189 Pneumonia, unspecified organism: Secondary | ICD-10-CM

## 2011-04-24 DIAGNOSIS — B20 Human immunodeficiency virus [HIV] disease: Secondary | ICD-10-CM

## 2011-04-24 DIAGNOSIS — E785 Hyperlipidemia, unspecified: Secondary | ICD-10-CM

## 2011-04-24 NOTE — Assessment & Plan Note (Signed)
Margaret Porter is infection remains under excellent control. Her latest CD4 count is 404 and her viral load is undetectable at less than 40.

## 2011-04-24 NOTE — Assessment & Plan Note (Addendum)
I am concerned that she may have community-acquired pneumonia. I will check a PA and lateral chest x-ray and then make a decision about whether she needs antibiotic therapy.  Addendum: her CXR was clear and she is feeling better today (4/25). She will continue sx therapy.

## 2011-04-24 NOTE — Assessment & Plan Note (Signed)
Her lipid panel was normal today.

## 2011-04-24 NOTE — Progress Notes (Signed)
  Subjective:    Patient ID: Margaret Porter, female    DOB: [birth year], 41 y.o.   MRN: [id number]  HPI Margaret Porter is in for her routine visit. She's only missed one dose of her Atripla since her last visit and that occurred when the pharmacy was late getting her refill. 5 days ago she began to develop some cough and mild shortness of breath. She does not think she's had any fever but she has had some mild morning sweats and had 1 chills 3 days ago. Her cough was initially productive of white sputum but now it has turned slightly red. She's not had any sinus congestion or sore throat.   Review of Systems     Objective:   Physical Exam  Constitutional: She appears well-developed.  HENT:  Mouth/Throat: Oropharynx is clear and moist. No oropharyngeal exudate.  Eyes: Conjunctivae are normal.  Cardiovascular: Normal rate, regular rhythm and normal heart sounds.   No murmur heard. Pulmonary/Chest: Effort normal. No respiratory distress.       She has some crackles in the right lower lobe posteriorly.  Skin: No rash noted. She is not diaphoretic.  Psychiatric: She has a normal mood and affect.          Assessment & Plan:

## 2011-04-25 ENCOUNTER — Encounter: Payer: Self-pay | Admitting: Internal Medicine

## 2011-04-25 LAB — CD4/CD8 (T-HELPER/T-SUPPRESSOR CELL)
CD4%: 26.9
CD8 % Suppressor T Cell: 47.2
CD8: 708

## 2011-05-30 ENCOUNTER — Encounter: Payer: Self-pay | Admitting: Internal Medicine

## 2011-06-29 ENCOUNTER — Ambulatory Visit (INDEPENDENT_AMBULATORY_CARE_PROVIDER_SITE_OTHER): Payer: BC Managed Care – PPO | Admitting: *Deleted

## 2011-06-29 VITALS — BP 118/79 | HR 76 | Temp 98.1°F | Wt 234.0 lb

## 2011-06-29 DIAGNOSIS — B20 Human immunodeficiency virus [HIV] disease: Secondary | ICD-10-CM

## 2011-06-29 LAB — LIPID PANEL
Total CHOL/HDL Ratio: 3.7 Ratio
VLDL: 26 mg/dL (ref 0–40)

## 2011-06-29 LAB — COMPREHENSIVE METABOLIC PANEL
ALT: 19 U/L (ref 0–35)
AST: 25 U/L (ref 0–37)
Chloride: 107 mEq/L (ref 96–112)
Creat: 0.76 mg/dL (ref 0.50–1.10)
Sodium: 136 mEq/L (ref 135–145)
Total Bilirubin: 0.2 mg/dL — ABNORMAL LOW (ref 0.3–1.2)
Total Protein: 6.8 g/dL (ref 6.0–8.3)

## 2011-06-29 NOTE — Progress Notes (Signed)
Patient here for week 352 ALLRT study visit. She has noticed a red rash under her breasts past few weeks that she has used cortisone cream on and it seems to be clearing. She denies any other problems Has been very adherent and her viral load is always suppressed. She will return in October for the next study visit.

## 2011-08-07 ENCOUNTER — Encounter: Payer: Self-pay | Admitting: Internal Medicine

## 2011-08-07 LAB — CD4/CD8 (T-HELPER/T-SUPPRESSOR CELL)
CD4%: 30
CD4: 480
CD8: 803

## 2011-11-21 ENCOUNTER — Ambulatory Visit (INDEPENDENT_AMBULATORY_CARE_PROVIDER_SITE_OTHER): Payer: BC Managed Care – PPO | Admitting: *Deleted

## 2011-11-21 VITALS — BP 126/86 | HR 91 | Temp 97.7°F | Wt 241.5 lb

## 2011-11-21 DIAGNOSIS — B20 Human immunodeficiency virus [HIV] disease: Secondary | ICD-10-CM

## 2011-11-21 DIAGNOSIS — Z23 Encounter for immunization: Secondary | ICD-10-CM

## 2011-11-21 DIAGNOSIS — Z0000 Encounter for general adult medical examination without abnormal findings: Secondary | ICD-10-CM

## 2011-11-21 LAB — LIPID PANEL
LDL Cholesterol: 103 mg/dL — ABNORMAL HIGH (ref 0–99)
Total CHOL/HDL Ratio: 3.5 Ratio
VLDL: 14 mg/dL (ref 0–40)

## 2011-11-21 LAB — CBC WITH DIFFERENTIAL/PLATELET
Basophils Relative: 0 % (ref 0–1)
Eosinophils Absolute: 0 10*3/uL (ref 0.0–0.7)
Hemoglobin: 12 g/dL (ref 12.0–15.0)
MCH: 30.3 pg (ref 26.0–34.0)
MCHC: 32.5 g/dL (ref 30.0–36.0)
Monocytes Relative: 9 % (ref 3–12)
Neutrophils Relative %: 66 % (ref 43–77)
Platelets: 302 10*3/uL (ref 150–400)
RDW: 13.7 % (ref 11.5–15.5)

## 2011-11-21 LAB — COMPREHENSIVE METABOLIC PANEL
ALT: 19 U/L (ref 0–35)
AST: 24 U/L (ref 0–37)
Albumin: 3.7 g/dL (ref 3.5–5.2)
Alkaline Phosphatase: 200 U/L — ABNORMAL HIGH (ref 39–117)
Potassium: 4.5 mEq/L (ref 3.5–5.3)
Sodium: 138 mEq/L (ref 135–145)
Total Protein: 6.1 g/dL (ref 6.0–8.3)

## 2011-11-21 NOTE — Progress Notes (Signed)
Patient here for her week 368 ALLRT study visit. She reports not feeling like herself this past month or so. She has been irritable, not wanting to work, groggy feeling, tired. She has also been having an increase in vivid dreams and wonders if her Atripla is causing her to have more side effects. Last week she had an episode of nausea and vomiting she thinks may have been related to something she ate. She has also noticed increase in allergic skin reactions, breaking out now and then. She plans to see Dr. Orvan Falconer in January after her next study appt, but will come in sooner if sx persist.

## 2012-01-21 ENCOUNTER — Encounter: Payer: Self-pay | Admitting: *Deleted

## 2012-01-21 ENCOUNTER — Ambulatory Visit (INDEPENDENT_AMBULATORY_CARE_PROVIDER_SITE_OTHER): Payer: Self-pay | Admitting: *Deleted

## 2012-01-21 VITALS — BP 126/84 | HR 72 | Temp 97.8°F | Ht 64.0 in | Wt 236.2 lb

## 2012-01-21 DIAGNOSIS — B20 Human immunodeficiency virus [HIV] disease: Secondary | ICD-10-CM

## 2012-01-21 LAB — COMPREHENSIVE METABOLIC PANEL
AST: 31 U/L (ref 0–37)
Albumin: 4.2 g/dL (ref 3.5–5.2)
BUN: 10 mg/dL (ref 6–23)
Calcium: 8.9 mg/dL (ref 8.4–10.5)
Chloride: 100 mEq/L (ref 96–112)
Glucose, Bld: 94 mg/dL (ref 70–99)
Potassium: 4.6 mEq/L (ref 3.5–5.3)

## 2012-01-21 LAB — CD4/CD8 (T-HELPER/T-SUPPRESSOR CELL): CD4%: 30.3

## 2012-01-21 LAB — CREATININE, URINE, RANDOM: Creatinine, Urine: 169.1 mg/dL

## 2012-01-21 LAB — HEPATITIS B SURFACE ANTIGEN: Hepatitis B Surface Ag: NEGATIVE

## 2012-01-21 LAB — HEPATITIS B CORE ANTIBODY, TOTAL: Hep B Core Total Ab: NEGATIVE

## 2012-01-21 LAB — LIPID PANEL: HDL: 48 mg/dL (ref >39)

## 2012-01-21 NOTE — Progress Notes (Signed)
Patient here for week 384 ALLRT study visit. She denies any new problems or concerns. She has scheduled an appt. with Dr. Orvan Falconer in February for followup and will probably need to schedule a PAP and mamogram at that time. She will have 2 more visits on this study before it closes in September, but we will probably be able to get her on another observational study for the decay of long term reservoirs.

## 2012-01-30 ENCOUNTER — Telehealth: Payer: Self-pay | Admitting: *Deleted

## 2012-01-30 NOTE — Telephone Encounter (Signed)
Message left to call [facility] for PAP smear appt. 

## 2012-02-07 ENCOUNTER — Other Ambulatory Visit: Payer: Self-pay | Admitting: *Deleted

## 2012-02-07 DIAGNOSIS — B20 Human immunodeficiency virus [HIV] disease: Secondary | ICD-10-CM

## 2012-02-07 MED ORDER — EFAVIRENZ-EMTRICITAB-TENOFOVIR 600-200-300 MG PO TABS: 1.0000 | ORAL_TABLET | Freq: Every day | ORAL | Status: DC

## 2012-02-07 NOTE — Telephone Encounter (Signed)
Patient called and said she was out of refills for her atripla and needed it filled at the [facility] on [address].

## 2012-02-08 ENCOUNTER — Encounter: Payer: Self-pay | Admitting: Internal Medicine

## 2012-02-11 ENCOUNTER — Encounter: Payer: Self-pay | Admitting: Internal Medicine

## 2012-02-11 ENCOUNTER — Ambulatory Visit (INDEPENDENT_AMBULATORY_CARE_PROVIDER_SITE_OTHER): Payer: BC Managed Care – PPO | Admitting: Internal Medicine

## 2012-02-11 NOTE — Progress Notes (Signed)
Patient ID: Margaret Porter, female   DOB: February 09, 1970, 42 y.o.   MRN: [id number]  INFECTIOUS DISEASE PROGRESS NOTE    Subjective: Margaret Porter is in for her routine visit. She recalls missing only one dose of her Atripla since her last visit. This occurred when she was in [place] overnight with her medication. She is doing well with the exception of having developed a cold with sinus congestion and scratchy throat 3 days ago. She has not had any fever or shortness of breath.  She used to run and get regular exercise but has gotten out of the habit. She does not follow any particular diet and describes herself as a picky eater.  Objective: Temp: 98 F (36.7 C) (02/11 1624) Temp src: Oral (02/11 1624) BP: 139/90 mmHg (02/11 1624) Pulse Rate: 89  (02/11 1624)  General: Her body mass index is 43 Oral: She has some pharyngeal erythema without exudates Skin: No rash Lungs: Clear Cor: Distant heart sounds with a regular S1 and S2 and no murmurs Abdomen: Obese, soft and nontender   Lab Results HIV 1 RNA Quant (copies/mL)  Date Value  07/24/2010 39   03/01/2010 39   08/04/2009 39      HIV-1 RNA Viral Load (no units)  Date Value  01/21/2012 <40   06/29/2011 <40   04/09/2011 <40      CD4 T Cell Abs (cmm)  Date Value  04/12/2009 390*  08/14/2004 19      Assessment: Her HIV infection remains under excellent control. I will continue Atripla.  Her #1 medical problem is her morbid obesity. I talked to her at length today about lifestyle modification. Will make a referral for the lifestyle modification class. I've encouraged her to try to resume several types of regular exercise.  She has a mild upper respiratory infection probably viral in nature. She can continue treatment with over-the-counter therapies.  Her blood pressure is slightly elevated today but that is probably related to her weight and her cold medications. Her LDL cholesterol is also just slightly elevated. I've recommended that  she address both of these to lifestyle modification.  Plan: 1. Continue Atripla 2. Lifestyle modification 3. Return to clinic in 6 months   Cliffton Asters, MD [facility] [phone] pager   [phone] cell 02/11/2012, 4:59 PM

## 2012-02-13 ENCOUNTER — Telehealth: Payer: Self-pay | Admitting: Dietician

## 2012-02-15 NOTE — Telephone Encounter (Signed)
Left 2nd message for patient with numbers to call to schedule an appointment for Medical Nutrition Therapy.[name] Kitchen

## 2012-03-20 ENCOUNTER — Encounter: Payer: Self-pay | Admitting: Dietician

## 2012-03-28 ENCOUNTER — Ambulatory Visit: Payer: BC Managed Care – PPO

## 2012-04-18 ENCOUNTER — Telehealth: Payer: Self-pay | Admitting: *Deleted

## 2012-04-18 NOTE — Telephone Encounter (Signed)
Patient called and said her rosacea has flared up on her face. Her face is red all over. She was treated for it back in 2006 and given metrogel and clindamax lotion for it by Dr. Yetta Barre. She is not able to get into see him until sometime in May and is asking what she should do. I told her I would check with Dr. Orvan Falconer on Monday when he is here in the clinic about a prescription for the metronidazole gel.  She will call back on Monday.

## 2012-04-21 ENCOUNTER — Telehealth: Payer: Self-pay | Admitting: *Deleted

## 2012-04-21 DIAGNOSIS — L719 Rosacea, unspecified: Secondary | ICD-10-CM

## 2012-04-21 NOTE — Telephone Encounter (Signed)
Patient had called last week and wanted something for her rosacea that has flared up recently. She had it once before in 2006 and Dr. Yetta Barre had prescribed metrogel BID and clindamax lotion. She is not able to get another dermatology appt until the end of May and wants to see if Dr. Orvan Falconer can prescribe the medication. I let Dr. Orvan Falconer know about here reuest today.

## 2012-04-22 DIAGNOSIS — L719 Rosacea, unspecified: Secondary | ICD-10-CM | POA: Insufficient documentation

## 2012-04-22 MED ORDER — METRONIDAZOLE 1 % EX GEL: Freq: Every day | CUTANEOUS | Status: AC

## 2012-04-22 MED ORDER — CLINDAMYCIN PHOSPHATE 1 % EX SOLN: CUTANEOUS | Status: AC

## 2012-04-22 NOTE — Telephone Encounter (Signed)
Addended by: Cliffton Asters on: 04/22/2012 12:23 PM   Modules accepted: Orders

## 2012-04-29 ENCOUNTER — Ambulatory Visit (INDEPENDENT_AMBULATORY_CARE_PROVIDER_SITE_OTHER): Payer: BC Managed Care – PPO | Admitting: *Deleted

## 2012-04-29 VITALS — BP 119/86 | HR 81 | Temp 98.7°F | Wt 243.8 lb

## 2012-04-29 DIAGNOSIS — B20 Human immunodeficiency virus [HIV] disease: Secondary | ICD-10-CM

## 2012-04-29 LAB — COMPREHENSIVE METABOLIC PANEL
AST: 21 U/L (ref 0–37)
Albumin: 3.9 g/dL (ref 3.5–5.2)
Alkaline Phosphatase: 185 U/L — ABNORMAL HIGH (ref 39–117)
BUN: 10 mg/dL (ref 6–23)
Creat: 0.7 mg/dL (ref 0.50–1.10)
Glucose, Bld: 100 mg/dL — ABNORMAL HIGH (ref 70–99)

## 2012-04-29 LAB — LIPID PANEL
HDL: 41 mg/dL (ref >39)
LDL Cholesterol: 106 mg/dL — ABNORMAL HIGH (ref 0–99)
Total CHOL/HDL Ratio: 4 Ratio
Triglycerides: 93 mg/dL (ref <150)

## 2012-04-29 NOTE — Progress Notes (Signed)
Patient here for week 400 study visit. Her face doesn't look red anymore. She says it cleared up using the metrogel. She will have one more visit on the ALLRT study and then be enrolled in 2 other observational studies in the Fall that she is eligible for.

## 2012-05-16 ENCOUNTER — Encounter: Payer: Self-pay | Admitting: Internal Medicine

## 2012-05-16 LAB — HIV-1 RNA QUANT-NO REFLEX-BLD: HIV-1 RNA Viral Load: <40

## 2012-05-16 LAB — CD4/CD8 (T-HELPER/T-SUPPRESSOR CELL)
CD4: 427
CD8 % Suppressor T Cell: 44.9

## 2012-05-23 ENCOUNTER — Telehealth: Payer: Self-pay | Admitting: *Deleted

## 2012-05-23 NOTE — Telephone Encounter (Signed)
Message left to call Center for PAP smear appt.

## 2012-06-03 ENCOUNTER — Encounter: Payer: Self-pay | Admitting: Internal Medicine

## 2012-07-17 ENCOUNTER — Encounter: Payer: Self-pay | Admitting: *Deleted

## 2012-08-14 ENCOUNTER — Ambulatory Visit (INDEPENDENT_AMBULATORY_CARE_PROVIDER_SITE_OTHER): Payer: BC Managed Care – PPO | Admitting: *Deleted

## 2012-08-14 ENCOUNTER — Encounter: Payer: Self-pay | Admitting: *Deleted

## 2012-08-14 ENCOUNTER — Other Ambulatory Visit: Payer: Self-pay | Admitting: Internal Medicine

## 2012-08-14 VITALS — BP 121/85 | HR 74 | Temp 98.0°F | Wt 246.5 lb

## 2012-08-14 DIAGNOSIS — Z113 Encounter for screening for infections with a predominantly sexual mode of transmission: Secondary | ICD-10-CM

## 2012-08-14 DIAGNOSIS — B20 Human immunodeficiency virus [HIV] disease: Secondary | ICD-10-CM

## 2012-08-14 DIAGNOSIS — Z124 Encounter for screening for malignant neoplasm of cervix: Secondary | ICD-10-CM | POA: Diagnosis not present

## 2012-08-14 LAB — LIPID PANEL
Cholesterol: 185 mg/dL (ref 0–200)
LDL Cholesterol: 114 mg/dL — ABNORMAL HIGH (ref 0–99)
Total CHOL/HDL Ratio: 3.9 Ratio
VLDL: 24 mg/dL (ref 0–40)

## 2012-08-14 LAB — COMPREHENSIVE METABOLIC PANEL
ALT: 22 U/L (ref 0–35)
AST: 25 U/L (ref 0–37)
Albumin: 3.8 g/dL (ref 3.5–5.2)
BUN: 9 mg/dL (ref 6–23)
CO2: 22 mEq/L (ref 19–32)
Calcium: 8.4 mg/dL (ref 8.4–10.5)
Chloride: 108 mEq/L (ref 96–112)
Creat: 0.68 mg/dL (ref 0.50–1.10)
Potassium: 4.4 mEq/L (ref 3.5–5.3)

## 2012-08-14 LAB — CREATININE, URINE, RANDOM: Creatinine, Urine: 137.7 mg/dL

## 2012-08-14 NOTE — Progress Notes (Signed)
  Subjective:     Margaret Porter is a 42 y.o. woman who comes in today for a  pap smear only. Previous abnormal Pap smears: no. Contraception: condoms  Objective:    LMP 07/22/2012 Pelvic Exam:Pap smear obtained.   Assessment:    Screening pap smear.   Plan:    Follow up in one year, or as indicated by Pap results.  Pt given educational materials re: HIV and women, BSE, PAP smears,, heart disease and HIV, self-esteem and partner safety. Pt given condoms.

## 2012-08-14 NOTE — Patient Instructions (Addendum)
Your results will be ready in about a week.  I will mail them to you.  Thank you for coming to the Center for your Care.  Rene Sizelove, RN 

## 2012-08-14 NOTE — Progress Notes (Signed)
Margaret Porter is here for her last ALLRT study visit. She is eligible for 2 other observational studies which will start later this Fall. She denies any new problems or concerns. She plans on making an appt to see Dr. Orvan Falconer soon.

## 2012-08-20 ENCOUNTER — Encounter: Payer: Self-pay | Admitting: *Deleted

## 2012-09-02 ENCOUNTER — Ambulatory Visit: Payer: BC Managed Care – PPO | Admitting: Internal Medicine

## 2012-10-14 ENCOUNTER — Ambulatory Visit (INDEPENDENT_AMBULATORY_CARE_PROVIDER_SITE_OTHER): Payer: BC Managed Care – PPO | Admitting: Internal Medicine

## 2012-10-14 ENCOUNTER — Telehealth: Payer: Self-pay | Admitting: *Deleted

## 2012-10-14 ENCOUNTER — Encounter: Payer: Self-pay | Admitting: Internal Medicine

## 2012-10-14 VITALS — BP 127/84 | HR 71 | Temp 97.7°F | Ht 63.0 in | Wt 236.2 lb

## 2012-10-14 DIAGNOSIS — J069 Acute upper respiratory infection, unspecified: Secondary | ICD-10-CM

## 2012-10-14 DIAGNOSIS — Z23 Encounter for immunization: Secondary | ICD-10-CM

## 2012-10-14 NOTE — Telephone Encounter (Signed)
Able to schedule pt for this afternoon w/ Dr. Orvan Falconer.

## 2012-10-14 NOTE — Progress Notes (Signed)
Patient ID: Margaret Porter, female   DOB: Oct 29, 1970, 42 y.o.   MRN: [id number]     [facility] Disease  Patient Active Problem List  Diagnosis  . HIV DISEASE  . HYPERLIPIDEMIA  . OBESITY  . URTICARIA, IDIOPATHIC  . HIP PAIN, LEFT  . URINARY INCONTINENCE, URGE  . Rosacea    Patient's Medications  New Prescriptions   No medications on file  Previous Medications   CETIRIZINE HCL (ZYRTEC PO)    Take 1 tablet by mouth once a day as needed for itching.    CLINDAMYCIN (CLEOCIN-T) 1 % EXTERNAL SOLUTION    Apply to affected area 2 times daily   EFAVIRENZ-EMTRICTABINE-TENOFOVIR (ATRIPLA) 600-200-300 MG PER TABLET    Take 1 tablet by mouth daily.   IBUPROFEN (ADVIL,MOTRIN) 200 MG TABLET    Take 200 mg by mouth every 6 (six) hours as needed.   METRONIDAZOLE (METROGEL) 1 % GEL    Apply topically daily.  Modified Medications   No medications on file  Discontinued Medications   No medications on file    Subjective: Margaret Porter is seeing a work in basis. She has not been feeling well for the past week. She began having some intermittent abdominal cramps last week. Initially she felt it was do to her menstrual cycle, she has had this before. However it continued and she also had some mild intermittent nausea. She has been quite exhausted and has found that several times she would come on from work and go right to sleep. As a result she has not taken her Atripla before bedtime several times in the past week. When this occurred she would take it before going to work in the morning. She feels like when she takes Atripla in the morning it may contribute to the nausea. The abdominal cramps have improved recently but today she has a sore throat. She has not had any fever, chills or sweats.she has not had any vomiting or diarrhea. She has not had any headache, rhinorrhea, nasal congestion, cough or shortness of breath. She has not had any dysuria.  Objective: Temp: 97.7 F (36.5 C)  (10/15 1521) Temp src: Oral (10/15 1521) BP: 127/84 mmHg (10/15 1521) Pulse Rate: 71  (10/15 1521)  General: she looks tired and sounds congested Skin: slightly pale Lungs: clear Cor: regular S1 and S2 no murmur Abdomen: obese, soft and nontender  Lab Results HIV 1 RNA Quant (copies/mL)  Date Value  07/24/2010 39   03/01/2010 39   08/04/2009 39      HIV-1 RNA Viral Load (no units)  Date Value  04/29/2012 <40   01/21/2012 <40   06/29/2011 <40      CD4 T Cell Abs (cmm)  Date Value  04/12/2009 390*  08/14/2004 19      Assessment: I suspect that her recent illness is multifactorial. She may have had some mild gastroenteritis causing the abdominal cramps and nausea. The onset of sore throat today suggests that she could also have a mild upper respiratory infection. However, I think that some of her fatigue over the past week is probably related to switching to taking her Atripla in the morning before going to work. I've asked her to go back to taking it before bedtime. I told her she can continue to take ibuprofen if it helps with the sore throat. She does not want to take any time off of work because there is no one to replace her.  Plan: 1. Take Atripla only  before bedtime 2. Ibuprofen as needed   Cliffton Asters, MD [facility] for Infectious Disease [facility] [phone] pager   [phone] cell 10/14/2012, 3:56 PM

## 2012-10-23 ENCOUNTER — Encounter: Payer: Self-pay | Admitting: Infectious Diseases

## 2012-12-12 ENCOUNTER — Encounter: Payer: Self-pay | Admitting: *Deleted

## 2012-12-12 ENCOUNTER — Ambulatory Visit (INDEPENDENT_AMBULATORY_CARE_PROVIDER_SITE_OTHER): Payer: BC Managed Care – PPO | Admitting: *Deleted

## 2012-12-12 VITALS — BP 114/80 | HR 72 | Temp 98.1°F | Ht 63.5 in | Wt 238.0 lb

## 2012-12-12 DIAGNOSIS — B20 Human immunodeficiency virus [HIV] disease: Secondary | ICD-10-CM

## 2012-12-12 LAB — HIV-1 RNA QUANT-NO REFLEX-BLD: HIV-1 RNA Viral Load: <40

## 2012-12-12 NOTE — Progress Notes (Signed)
  Subjective:    Patient ID: Margaret Porter, female    DOB: 1970-07-15, 42 y.o.   MRN: [id number]  HPI    Review of Systems  Constitutional: Negative.   HENT: Negative.   Eyes: Negative.   Respiratory: Negative.   Cardiovascular: Negative.   Gastrointestinal: Negative.   Genitourinary: Positive for urgency.  Musculoskeletal: Positive for arthralgias.  Skin: Negative.   Neurological: Negative.   Hematological: Negative.   Psychiatric/Behavioral: Negative.        Objective:   Physical Exam  Constitutional: She is oriented to person, place, and time. She appears well-developed.  HENT:  Head: Normocephalic.  Mouth/Throat: Posterior oropharyngeal erythema present.  Eyes: Lids are normal.  Neck: Normal range of motion. No mass present.  Cardiovascular: Normal rate, regular rhythm, intact distal pulses and normal pulses.   Pulmonary/Chest: Breath sounds normal. No respiratory distress.  Abdominal: Soft. Normal appearance and bowel sounds are normal. There is no hepatomegaly.  Musculoskeletal: Normal range of motion.  Lymphadenopathy:       Head (right side): No submental, no submandibular, no preauricular, no posterior auricular and no occipital adenopathy present.       Head (left side): No submental, no submandibular, no preauricular, no posterior auricular and no occipital adenopathy present.    She has no cervical adenopathy.       Right: No supraclavicular adenopathy present.       Left: No supraclavicular adenopathy present.  Neurological: She is alert and oriented to person, place, and time.  Skin: Skin is warm, dry and intact.  Psychiatric: She has a normal mood and affect. Her behavior is normal.          Assessment & Plan:

## 2012-12-12 NOTE — Progress Notes (Signed)
Patient screened today for the [facility] study, looking at viral reservoirs. Informed consent was obtained using SOP guidelines. She is also eligible for the [facility] study and will enter that study in a few months. She will return in a few weeks for the entry visit. She has problems with aching feet and a sore big toe on the left foot. She has several allergies to meds and foods, but has not had any flareups lately.

## 2012-12-16 ENCOUNTER — Encounter: Payer: Self-pay | Admitting: Infectious Disease

## 2013-01-05 ENCOUNTER — Encounter: Payer: Self-pay | Admitting: Internal Medicine

## 2013-02-24 ENCOUNTER — Other Ambulatory Visit: Payer: Self-pay | Admitting: Internal Medicine

## 2013-02-26 ENCOUNTER — Ambulatory Visit (INDEPENDENT_AMBULATORY_CARE_PROVIDER_SITE_OTHER): Payer: BC Managed Care – PPO | Admitting: *Deleted

## 2013-02-26 VITALS — BP 120/85 | HR 72 | Temp 98.4°F | Resp 16 | Wt 245.5 lb

## 2013-02-26 DIAGNOSIS — Z21 Asymptomatic human immunodeficiency virus [HIV] infection status: Secondary | ICD-10-CM

## 2013-02-26 DIAGNOSIS — B20 Human immunodeficiency virus [HIV] disease: Secondary | ICD-10-CM

## 2013-02-26 LAB — COMPREHENSIVE METABOLIC PANEL
Albumin: 3.7 g/dL (ref 3.5–5.2)
BUN: 10 mg/dL (ref 6–23)
CO2: 22 mEq/L (ref 19–32)
Calcium: 8.5 mg/dL (ref 8.4–10.5)
Glucose, Bld: 97 mg/dL (ref 70–99)
Potassium: 4.1 mEq/L (ref 3.5–5.3)
Sodium: 138 mEq/L (ref 135–145)
Total Protein: 6.3 g/dL (ref 6.0–8.3)

## 2013-02-26 NOTE — Progress Notes (Signed)
Patient here today to enroll in the Viral reservoirs Study. She signed the consent at the last visit in December. She will enroll in the [facility] study looking at HIV and Aging in April and will see Dr. Orvan Falconer again that month. She denies any new problems except for a lesion/bump that keeps coming up on her mid back. It is a small scarred area now that looks like there had been a large pimple. She says it has been coming and going and does not drain anything, but when it is present it bothers her. I told her the next time it came back to have her medical provider look at it.

## 2013-03-25 ENCOUNTER — Encounter: Payer: Self-pay | Admitting: Internal Medicine

## 2013-03-25 LAB — CD4/CD8 (T-HELPER/T-SUPPRESSOR CELL)
CD4%: 32.9
CD4: 494

## 2013-04-13 ENCOUNTER — Other Ambulatory Visit: Payer: Self-pay | Admitting: *Deleted

## 2013-04-13 DIAGNOSIS — B20 Human immunodeficiency virus [HIV] disease: Secondary | ICD-10-CM

## 2013-04-16 ENCOUNTER — Other Ambulatory Visit: Payer: BC Managed Care – PPO

## 2013-05-22 ENCOUNTER — Ambulatory Visit (INDEPENDENT_AMBULATORY_CARE_PROVIDER_SITE_OTHER): Payer: BC Managed Care – PPO | Admitting: *Deleted

## 2013-05-22 ENCOUNTER — Encounter: Payer: Self-pay | Admitting: *Deleted

## 2013-05-22 VITALS — BP 112/78 | HR 67 | Temp 97.6°F | Resp 16 | Ht 63.5 in | Wt 246.5 lb

## 2013-05-22 DIAGNOSIS — B20 Human immunodeficiency virus [HIV] disease: Secondary | ICD-10-CM

## 2013-05-22 DIAGNOSIS — Z21 Asymptomatic human immunodeficiency virus [HIV] infection status: Secondary | ICD-10-CM

## 2013-05-22 LAB — LIPID PANEL
Cholesterol: 166 mg/dL (ref 0–200)
HDL: 41 mg/dL (ref >39)
Total CHOL/HDL Ratio: 4 Ratio
Triglycerides: 102 mg/dL (ref <150)
VLDL: 20 mg/dL (ref 0–40)

## 2013-05-22 LAB — CBC WITH DIFFERENTIAL/PLATELET
Basophils Relative: 0 % (ref 0–1)
Eosinophils Absolute: 0.1 10*3/uL (ref 0.0–0.7)
Hemoglobin: 11.1 g/dL — ABNORMAL LOW (ref 12.0–15.0)
MCH: 29.2 pg (ref 26.0–34.0)
MCHC: 33 g/dL (ref 30.0–36.0)
Monocytes Relative: 14 % — ABNORMAL HIGH (ref 3–12)
Neutrophils Relative %: 58 % (ref 43–77)
RDW: 15.8 % — ABNORMAL HIGH (ref 11.5–15.5)

## 2013-05-22 LAB — COMPREHENSIVE METABOLIC PANEL
BUN: 7 mg/dL (ref 6–23)
CO2: 23 mEq/L (ref 19–32)
Creat: 0.71 mg/dL (ref 0.50–1.10)
Glucose, Bld: 93 mg/dL (ref 70–99)
Total Bilirubin: 0.3 mg/dL (ref 0.3–1.2)
Total Protein: 6.2 g/dL (ref 6.0–8.3)

## 2013-05-22 LAB — HEPATITIS B SURFACE ANTIGEN: Hepatitis B Surface Ag: NEGATIVE

## 2013-05-22 LAB — HEMOGLOBIN A1C: Hgb A1c MFr Bld: 5.5 % (ref <5.7)

## 2013-05-22 NOTE — Progress Notes (Signed)
Patient here for entry into the [facility] study about HIV and Aging. Informed consent obtained per SOP guidelines. She is currently having allergy sx of sneezing and nasal drainage. There is a eraser size scaly lesion on her midback that has been there for awhile that she says is tender. She says she will get it assessed by a dermatologist. She has an appt with Dr. Orvan Falconer in June.

## 2013-06-17 ENCOUNTER — Encounter: Payer: Self-pay | Admitting: Internal Medicine

## 2013-06-24 ENCOUNTER — Encounter: Payer: Self-pay | Admitting: Internal Medicine

## 2013-06-24 ENCOUNTER — Ambulatory Visit (INDEPENDENT_AMBULATORY_CARE_PROVIDER_SITE_OTHER): Payer: BC Managed Care – PPO | Admitting: Internal Medicine

## 2013-06-24 VITALS — BP 135/83 | HR 74 | Temp 98.1°F | Wt 246.0 lb

## 2013-06-24 DIAGNOSIS — B20 Human immunodeficiency virus [HIV] disease: Secondary | ICD-10-CM

## 2013-06-24 NOTE — Progress Notes (Signed)
Patient ID: Margaret Porter, female   DOB: Feb 14, 1970, 43 y.o.   MRN: [id number]          [facility] for Infectious Disease  Patient Active Problem List   Diagnosis Date Noted  . HIV DISEASE 01/24/2007    Priority: High  . Acute URI 10/14/2012  . Rosacea 04/22/2012  . URINARY INCONTINENCE, URGE 10/24/2010  . HIP PAIN, LEFT 05/04/2008  . HYPERLIPIDEMIA 11/06/2007  . OBESITY 11/06/2007  . URTICARIA, IDIOPATHIC 03/06/2007    Patient's Medications  New Prescriptions   No medications on file  Previous Medications   ATRIPLA 600-200-300 MG PER TABLET    TAKE 1 TABLET BY MOUTH ONCE DAILY   CETIRIZINE HCL (ZYRTEC PO)    Take 1 tablet by mouth once a day as needed for itching.    IBUPROFEN (ADVIL,MOTRIN) 200 MG TABLET    Take 200 mg by mouth every 6 (six) hours as needed.  Modified Medications   No medications on file  Discontinued Medications   No medications on file    Subjective: Margaret Porter is in for her routine visit. She is not as fatigued as she was at the time of her last visit but she still notes that she will often fall asleep on the couch at night before taking her Atripla. She estimates his missing about 2 doses each month.  Review of Systems: Pertinent items are noted in HPI.  No past medical history on file.  History  Substance Use Topics  . Smoking status: Never Smoker   . Smokeless tobacco: Never Used  . Alcohol Use: No     Comment: occassional    No family history on file.  Allergies  Allergen Reactions  . Amoxicillin     REACTION: rash  . Sulfamethoxazole W-Trimethoprim     REACTION: rash  . Aspirin Anxiety    Objective: Temp: 98.1 F (36.7 C) (06/25 1504) Temp src: Oral (06/25 1504) BP: 135/83 mmHg (06/25 1504) Pulse Rate: 74 (06/25 1504)  General: She is in good spirits. Her BMI remains quite high at 42 Oral: No oropharyngeal lesions Skin: No rash Lungs: Clear Cor: Regular S1 and S2 no murmurs Abdomen: Soft nontender Joints and  extremities: Normal Neuro: Alert and oriented with normal speech Mood and affect: Normal  Lab Results HIV 1 RNA Quant (copies/mL)  Date Value  07/24/2010 39   03/01/2010 39   08/04/2009 39      HIV-1 RNA Viral Load (no units)  Date Value  02/26/2013 <40   12/12/2012 <40   08/14/2012 <40      CD4 T Cell Abs (cmm)  Date Value  04/12/2009 390*  08/14/2004 19      Assessment: Her HIV infection remains suppressed as of her most recent blood work 4 months ago. I encouraged her to set her cell phone alarm and try not to miss a single dose of Atripla. I also talked to her about other treatment options that could be taken during the day the might be easier for her to remember.  Plan: 1. Continue Atripla for now 2. Set her cell phone alarm as a reminder to take her medication 3. I asked her to call me if she continues to miss doses between now and her next visit in 6 months   Cliffton Asters, MD [facility] for Infectious Disease [facility] [phone] pager   [phone] cell 06/24/2013, 3:22 PM

## 2013-07-31 ENCOUNTER — Ambulatory Visit: Payer: BC Managed Care – PPO | Admitting: *Deleted

## 2013-07-31 ENCOUNTER — Ambulatory Visit (INDEPENDENT_AMBULATORY_CARE_PROVIDER_SITE_OTHER): Payer: BC Managed Care – PPO | Admitting: *Deleted

## 2013-07-31 VITALS — BP 111/82 | HR 78 | Temp 98.1°F | Wt 252.2 lb

## 2013-07-31 DIAGNOSIS — Z21 Asymptomatic human immunodeficiency virus [HIV] infection status: Secondary | ICD-10-CM

## 2013-07-31 DIAGNOSIS — B20 Human immunodeficiency virus [HIV] disease: Secondary | ICD-10-CM

## 2013-07-31 DIAGNOSIS — Z124 Encounter for screening for malignant neoplasm of cervix: Secondary | ICD-10-CM

## 2013-07-31 LAB — COMPREHENSIVE METABOLIC PANEL
ALT: 20 U/L (ref 0–35)
Albumin: 3.8 g/dL (ref 3.5–5.2)
CO2: 24 mEq/L (ref 19–32)
Glucose, Bld: 110 mg/dL — ABNORMAL HIGH (ref 70–99)
Potassium: 4.2 mEq/L (ref 3.5–5.3)
Sodium: 137 mEq/L (ref 135–145)
Total Protein: 6.2 g/dL (ref 6.0–8.3)

## 2013-07-31 NOTE — Patient Instructions (Signed)
  Your results will be ready in about a week.  You may look them up on MyChart.  Thank you for coming to the Center for your care.  Denise, RN 

## 2013-07-31 NOTE — Progress Notes (Signed)
Patient here for week 25 A5321 study visit. She denies any new problems or concerns. She will be back in October for the next study visit.

## 2013-07-31 NOTE — Progress Notes (Signed)
  Subjective:     Margaret Porter is a 43 y.o. woman who comes in today for a  pap smear only.  Previous abnormal Pap smears: no. Contraception: condoms  Objective:    There were no vitals taken for this visit. Pelvic Exam: Pap smear obtained.   Assessment:    Screening pap smear.   Plan:    Follow up in one year, or as indicated by Pap results.  Pt given educational materials re: HIV and diet, nutrition, exercise, BSE, health promotion, self-esteem, partner protection and PAP smears.   Declined condoms.  Requested appointment with Nutritionist.  Made for 08/03/13.

## 2013-08-03 ENCOUNTER — Ambulatory Visit: Payer: BC Managed Care – PPO

## 2013-08-03 VITALS — Ht 63.5 in | Wt 251.5 lb

## 2013-08-03 DIAGNOSIS — E785 Hyperlipidemia, unspecified: Secondary | ICD-10-CM

## 2013-08-03 DIAGNOSIS — E669 Obesity, unspecified: Secondary | ICD-10-CM

## 2013-08-03 NOTE — Progress Notes (Signed)
Medical Nutrition Therapy:  Appt start time: 1500 end time:  1600.  Assessment:  Primary concerns today: obesity. LDL105, Alk Phos 174. Pt has some significant FHx for DM and CAD, CHF.   MEDICATIONS: see list.   DIETARY INTAKE: Everyday foods include fruit punch, chips.  Avoided foods include many vegetables.    24-hr recall: Pt claims she has a very inconsistent eating pattern, and often eats only 1 or 2 meals each day. She says she will simply forget to eat when she is busy and then relies on snack food like chips and high kcal foods like fast food, burgers, fries, chix tenders at home.  B ( AM): rarely eats.  When she does, it would be a chix biscuit and tater tots with OJ Snk ( AM): sometimes will eat a snack- usually this will be something like chips  L ( PM): steak and cheese sandwich, burger, chix tenders, fries if she is cooking at home with fruit punch. If eating out, fast food like a burger with fries and a soda, or chix nuggets or chix sandwich.  Snk ( PM): chips, may drink punch or water D ( PM): will cook meals to eat for about 2-3 days in a row. Spaghetti (red sauce) with salad (if she has salad greens) and/or bread (if she has some). Salmon with green beans and corn (eating less often), cauliflower, broccoli, may have potatoes. Meatloaf (cheapest ground round available) made with some onion and red sauce, sides of potatoes or mac and cheese, green beans. Fruit punch. Snk ( PM): will have cake if she has baked. Sometimes ice cream. Rarely candy or chocolate.  Beverages: OJ, fruit punch, water, soda, about 1 EtOH drink per week (usually a wine cooler) usually only during summer  Usual physical activity: used to be avid runner, but has had problems with her feet and has since quit. 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 None otherwise.  Progress Towards Goal(s):  In progress.   Nutritional Diagnosis:  [place]-3.3 Overweight/obesity As related to erratic eating  pattern which contributes to desire for sweet drinks, very salty and fatty foods.  As evidenced by pt diet recall containing frequent sweet drink consumption, large quantity of fast food, chips, etc.    Intervention:  Nutrition counseling provided to reduce kcal intake by limiting high fat, high sugar foods, eating a more regular meal pattern (at least 3 times daily within 7 hours of last meal) including a high protein and high fiber food at each meal. 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 Switching to diet or light versions of sweetened drinks and bringing a water bottle to drink during the day were the primary emphasis for kcal reduction. Also, choosing leaner proteins, more vegetables and whole grains was discussed. Quick and enjoyable options for breakfast foods were discussed, as well as microwaveable options for quick cooking dinners. RD also recommended the pt explore any of a variety of exercise options that have low impact- elliptical, cycling, swimming, water aerobics, calisthenics- for the pt to do, with minimum 150 minutes per week in addition to work as the goal.   Handouts given during visit include:  Best Protein, Fat, and CHO choices for heart health  High Fat, Sugar, Protein, and Fiber  foods  Home Workout  Monitoring/Evaluation:  Dietary intake, exercise, portion control, and body weight prn.

## 2013-08-04 ENCOUNTER — Encounter: Payer: Self-pay | Admitting: *Deleted

## 2013-08-13 ENCOUNTER — Encounter: Payer: Self-pay | Admitting: Internal Medicine

## 2013-08-20 ENCOUNTER — Encounter: Payer: Self-pay | Admitting: Internal Medicine

## 2013-08-20 LAB — CD4/CD8 (T-HELPER/T-SUPPRESSOR CELL)
CD4%: 33.2
CD4: 531

## 2013-09-04 ENCOUNTER — Encounter: Payer: Self-pay | Admitting: Internal Medicine

## 2013-10-20 ENCOUNTER — Ambulatory Visit (INDEPENDENT_AMBULATORY_CARE_PROVIDER_SITE_OTHER): Payer: Self-pay | Admitting: *Deleted

## 2013-10-20 ENCOUNTER — Ambulatory Visit (INDEPENDENT_AMBULATORY_CARE_PROVIDER_SITE_OTHER): Payer: BC Managed Care – PPO | Admitting: *Deleted

## 2013-10-20 VITALS — BP 117/79 | HR 76 | Temp 98.0°F | Resp 16 | Ht 63.0 in | Wt 243.5 lb

## 2013-10-20 DIAGNOSIS — B20 Human immunodeficiency virus [HIV] disease: Secondary | ICD-10-CM

## 2013-10-20 DIAGNOSIS — Z0000 Encounter for general adult medical examination without abnormal findings: Secondary | ICD-10-CM

## 2013-10-20 DIAGNOSIS — Z21 Asymptomatic human immunodeficiency virus [HIV] infection status: Secondary | ICD-10-CM

## 2013-10-20 LAB — RPR

## 2013-10-20 NOTE — Progress Notes (Signed)
Patient here for week 24 A5322 study. She recently dropped a can on her left foot and it has been bruised and swollen. She had it checked out and there were no broken bones, but she had to wear a boot for awhile because it was so swollen. She received a flushot today as well as had an RPR checked. She is to see Dr. Orvan Falconer in December.

## 2013-12-21 ENCOUNTER — Ambulatory Visit (INDEPENDENT_AMBULATORY_CARE_PROVIDER_SITE_OTHER): Payer: Self-pay | Admitting: *Deleted

## 2013-12-21 VITALS — BP 121/84 | HR 79 | Temp 98.3°F | Wt 240.5 lb

## 2013-12-21 DIAGNOSIS — Z21 Asymptomatic human immunodeficiency virus [HIV] infection status: Secondary | ICD-10-CM

## 2013-12-21 DIAGNOSIS — B20 Human immunodeficiency virus [HIV] disease: Secondary | ICD-10-CM

## 2013-12-21 LAB — COMPREHENSIVE METABOLIC PANEL
Alkaline Phosphatase: 173 U/L — ABNORMAL HIGH (ref 39–117)
BUN: 9 mg/dL (ref 6–23)
CO2: 21 mEq/L (ref 19–32)
Calcium: 8.5 mg/dL (ref 8.4–10.5)
Glucose, Bld: 93 mg/dL (ref 70–99)
Sodium: 138 mEq/L (ref 135–145)
Total Bilirubin: 0.3 mg/dL (ref 0.3–1.2)
Total Protein: 6.1 g/dL (ref 6.0–8.3)

## 2013-12-21 LAB — HEPATITIS C ANTIBODY: HCV Ab: NEGATIVE

## 2013-12-21 NOTE — Progress Notes (Signed)
Patient here for week 48 A5322 study visit. Says she has been having occassional left hip pain and stiffness, particularly when she first gets up. No other problems noted. She is to see Dr. Orvan Falconer in a few weeks.

## 2013-12-29 ENCOUNTER — Ambulatory Visit: Payer: BC Managed Care – PPO | Admitting: Internal Medicine

## 2014-01-06 ENCOUNTER — Ambulatory Visit (INDEPENDENT_AMBULATORY_CARE_PROVIDER_SITE_OTHER): Payer: BC Managed Care – PPO | Admitting: Internal Medicine

## 2014-01-06 ENCOUNTER — Encounter: Payer: Self-pay | Admitting: Internal Medicine

## 2014-01-06 VITALS — BP 138/89 | HR 84 | Temp 97.7°F | Ht 63.0 in | Wt 240.0 lb

## 2014-01-06 DIAGNOSIS — M25559 Pain in unspecified hip: Secondary | ICD-10-CM

## 2014-01-06 DIAGNOSIS — F518 Other sleep disorders not due to a substance or known physiological condition: Secondary | ICD-10-CM

## 2014-01-06 DIAGNOSIS — IMO0002 Reserved for concepts with insufficient information to code with codable children: Secondary | ICD-10-CM

## 2014-01-06 DIAGNOSIS — M25552 Pain in left hip: Secondary | ICD-10-CM

## 2014-01-06 NOTE — Progress Notes (Signed)
Patient ID: Margaret Porter, female   DOB: February 14, 1970, 44 y.o.   MRN: [id number]          [facility] for Infectious Disease  Patient Active Problem List   Diagnosis Date Noted  . HIV DISEASE 01/24/2007    Priority: High  . Abnormal dreams 01/06/2014    Priority: Medium  . HIP PAIN, LEFT 05/04/2008    Priority: Medium  . Acute URI 10/14/2012  . Rosacea 04/22/2012  . URINARY INCONTINENCE, URGE 10/24/2010  . HYPERLIPIDEMIA 11/06/2007  . OBESITY 11/06/2007  . URTICARIA, IDIOPATHIC 03/06/2007    Patient's Medications  New Prescriptions   No medications on file  Previous Medications   ATRIPLA 600-200-300 MG PER TABLET    TAKE 1 TABLET BY MOUTH ONCE DAILY   CETIRIZINE HCL (ZYRTEC PO)    Take 1 tablet by mouth once a day as needed for itching.    IBUPROFEN (ADVIL,MOTRIN) 200 MG TABLET    Take 200 mg by mouth every 6 (six) hours as needed.  Modified Medications   No medications on file  Discontinued Medications   No medications on file    Subjective: Margaret Porter is in for her routine visit. She continues to take Atripla and still estimates that she misses about 2 doses of months when she falls asleep before taking it. 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 She has not set herself on alarm to remind her to take it. She also notes that she is still having very vivid dreams. These are more common on weekends when she tends to stay up later. For the last month she has been noting pain in her left hip. She initially thought it might be related to sleeping on her left hip. The pain is most noticeable when she is walking, standing and climbing stairs. She rates the pain as 4/10. She does not recall any injury prior to onset of pain. Review of Systems: Constitutional: positive for malaise, negative for anorexia, chills, fevers, sweats and weight loss Eyes: negative Ears, nose, mouth, throat, and face:  negative Respiratory: negative Cardiovascular: negative Gastrointestinal: negative Genitourinary:negative Musculoskeletal:positive for recent left hip pain  Past Medical History  Diagnosis Date  . HIV infection     History  Substance Use Topics  . Smoking status: Never Smoker   . Smokeless tobacco: Never Used  . Alcohol Use: No     Comment: occassional    Family History  Problem Relation Age of Onset  . Diabetes Mother   . Diabetes Brother   . Heart disease Paternal Uncle   . Heart disease Maternal Grandmother   . Cancer Maternal Grandmother   . Heart disease Paternal Grandmother     Allergies  Allergen Reactions  . Amoxicillin     REACTION: rash  . Sulfamethoxazole-Trimethoprim     REACTION: rash  . Aspirin Anxiety    Objective: Temp: 97.7 F (36.5 C) (01/07 1640) Temp src: Oral (01/07 1640) BP: 138/89 mmHg (01/07 1640) Pulse Rate: 84 (01/07 1640)  General: Her weight is 240 pounds Oral: No oropharyngeal lesions Skin: No rash Lungs: Clear Cor: Regular S1-S2 no murmurs Abdomen: Obese, soft and nontender Joints and extremities: Good range of motion of her left hip with no obvious crepitus or other signs of acute inflammation Neuro: Alert with normal speech and conversation Mood and affect: Normal  Lab Results Lab Results  Component Value Date   WBC 3.8*  05/22/2013   HGB 11.1* 05/22/2013   HCT 33.6* 05/22/2013   MCV 88.4 05/22/2013   PLT 255 05/22/2013    Lab Results  Component Value Date   CREATININE 0.70 12/21/2013   BUN 9 12/21/2013   NA 138 12/21/2013   K 4.1 12/21/2013   CL 108 12/21/2013   CO2 21 12/21/2013    Lab Results  Component Value Date   ALT 21 12/21/2013   AST 26 12/21/2013   ALKPHOS 173* 12/21/2013   BILITOT 0.3 12/21/2013    Lab Results  Component Value Date   CHOL 166 05/22/2013   HDL 41 05/22/2013   LDLCALC 105* 05/22/2013   TRIG 102 05/22/2013   CHOLHDL 4.0 05/22/2013    Lab Results CD4 count 531 and HIV viral load  less than 40 August 2014   Assessment: Her HIV infection remains under very good control. However she is still missing occasional doses. She is still having vivid dreams that are sometimes bothersome for her related to the efavarenz component of Atripla. I talked her about switching to another one pill a day regimen that can be taken in the morning but she is reluctant to make a change. I asked her to set herself on alarm to help them on her to take her evening dose. I also asked her to call me if she would like to make a change.  She has new left hip pain but I note that she had a period of pain back in 2009 of unknown cause.  I will check a plain x-ray today to look for any evidence of degenerative disease or avascular necrosis.  Talk to her about the importance of weight control and weight loss.  Plan: 1.  Continue Atripla for now 2. adherence counseling provided 3. Check left hip x-ray 4.  Lifestyle modification counseling provided for weight loss 5. Followup in 6 months sooner if x-ray is abnormal   Michel Bickers, MD [facility] for [place] [phone] pager   [phone] cell 01/06/2014, 5:10 PM

## 2014-01-08 ENCOUNTER — Encounter: Payer: Self-pay | Admitting: Internal Medicine

## 2014-01-08 LAB — HIV-1 RNA QUANT-NO REFLEX-BLD: HIV-1 RNA Viral Load: <40

## 2014-01-13 ENCOUNTER — Encounter: Payer: Self-pay | Admitting: Internal Medicine

## 2014-01-13 LAB — CD4/CD8 (T-HELPER/T-SUPPRESSOR CELL)
CD4%: 33.9
CD4: 475
CD8 % Suppressor T Cell: 41.1
CD8: 575

## 2014-01-13 LAB — HIV-1 RNA QUANT-NO REFLEX-BLD: HIV-1 RNA Viral Load: <40

## 2014-03-27 ENCOUNTER — Other Ambulatory Visit: Payer: Self-pay | Admitting: Internal Medicine

## 2014-04-06 ENCOUNTER — Encounter: Payer: Self-pay | Admitting: *Deleted

## 2014-04-06 ENCOUNTER — Ambulatory Visit (INDEPENDENT_AMBULATORY_CARE_PROVIDER_SITE_OTHER): Payer: Self-pay | Admitting: *Deleted

## 2014-04-06 ENCOUNTER — Other Ambulatory Visit: Payer: Self-pay | Admitting: Infectious Disease

## 2014-04-06 VITALS — BP 127/85 | HR 80 | Temp 98.1°F | Resp 16 | Wt 242.5 lb

## 2014-04-06 DIAGNOSIS — Z21 Asymptomatic human immunodeficiency virus [HIV] infection status: Secondary | ICD-10-CM

## 2014-04-06 DIAGNOSIS — B20 Human immunodeficiency virus [HIV] disease: Secondary | ICD-10-CM

## 2014-04-06 LAB — COMPREHENSIVE METABOLIC PANEL
ALT: 21 U/L (ref 0–35)
AST: 26 U/L (ref 0–37)
Albumin: 3.9 g/dL (ref 3.5–5.2)
Alkaline Phosphatase: 206 U/L — ABNORMAL HIGH (ref 39–117)
BUN: 7 mg/dL (ref 6–23)
CALCIUM: 8.6 mg/dL (ref 8.4–10.5)
CHLORIDE: 105 meq/L (ref 96–112)
CO2: 25 mEq/L (ref 19–32)
Creat: 0.73 mg/dL (ref 0.50–1.10)
Glucose, Bld: 96 mg/dL (ref 70–99)
POTASSIUM: 4.3 meq/L (ref 3.5–5.3)
Sodium: 136 mEq/L (ref 135–145)
TOTAL PROTEIN: 6.5 g/dL (ref 6.0–8.3)
Total Bilirubin: 0.3 mg/dL (ref 0.2–1.2)

## 2014-04-06 LAB — CBC WITH DIFFERENTIAL/PLATELET
Basophils Absolute: 0 10*3/uL (ref 0.0–0.1)
Basophils Relative: 0 % (ref 0–1)
Eosinophils Absolute: 0.1 10*3/uL (ref 0.0–0.7)
Eosinophils Relative: 1 % (ref 0–5)
HEMATOCRIT: 35.9 % — AB (ref 36.0–46.0)
Hemoglobin: 11.8 g/dL — ABNORMAL LOW (ref 12.0–15.0)
Lymphocytes Relative: 27 % (ref 12–46)
Lymphs Abs: 1.4 10*3/uL (ref 0.7–4.0)
MCH: 27.6 pg (ref 26.0–34.0)
MCHC: 32.9 g/dL (ref 30.0–36.0)
MCV: 83.9 fL (ref 78.0–100.0)
MONO ABS: 0.5 10*3/uL (ref 0.1–1.0)
Monocytes Relative: 10 % (ref 3–12)
Neutro Abs: 3.2 10*3/uL (ref 1.7–7.7)
Neutrophils Relative %: 62 % (ref 43–77)
Platelets: 302 10*3/uL (ref 150–400)
RBC: 4.28 MIL/uL (ref 3.87–5.11)
RDW: 16.4 % — AB (ref 11.5–15.5)
WBC: 5.1 10*3/uL (ref 4.0–10.5)

## 2014-04-06 LAB — LIPID PANEL
Cholesterol: 183 mg/dL (ref 0–200)
HDL: 51 mg/dL (ref >39)
LDL Cholesterol: 104 mg/dL — ABNORMAL HIGH (ref 0–99)
Total CHOL/HDL Ratio: 3.6 Ratio
Triglycerides: 142 mg/dL (ref <150)
VLDL: 28 mg/dL (ref 0–40)

## 2014-04-06 NOTE — Progress Notes (Signed)
Margaret Porter is here for A5322, week 47. I reviewed and discussed amended protocol and revised ICF. I answered her questions and she verbalized understanding and signed consent. A copy was made of signed ICF and given to her. She states that her left hip pain has resolved and she did not get image done because it was feeling better. She states she has occasional stiffness in hands bilat when she is doing things like cutting paper. This has been going on for a couple years. Fasting labs obtained, questionnaires completed, and vital signs are stable. She received $50 gift card for study visit. Next appointment scheduled for Tuesday, July 13, 2014 @ 9am. Eliezer Champagne RN

## 2014-04-07 LAB — PROTEIN, URINE, RANDOM: TOTAL PROTEIN, URINE: 14 mg/dL

## 2014-04-07 LAB — HEMOGLOBIN A1C
Hgb A1c MFr Bld: 5.1 % (ref <5.7)
MEAN PLASMA GLUCOSE: 100 mg/dL (ref <117)

## 2014-04-07 LAB — CREATININE, URINE, RANDOM: CREATININE, URINE: 240.2 mg/dL

## 2014-04-07 NOTE — Addendum Note (Signed)
Addended by: Araceli Bouche on: 04/07/2014 10:49 AM   Modules accepted: Orders

## 2014-07-13 ENCOUNTER — Ambulatory Visit (INDEPENDENT_AMBULATORY_CARE_PROVIDER_SITE_OTHER): Payer: Self-pay | Admitting: *Deleted

## 2014-07-13 VITALS — BP 120/78 | HR 72 | Temp 97.9°F | Resp 16 | Wt 246.5 lb

## 2014-07-13 DIAGNOSIS — Z006 Encounter for examination for normal comparison and control in clinical research program: Secondary | ICD-10-CM

## 2014-07-13 DIAGNOSIS — B20 Human immunodeficiency virus [HIV] disease: Secondary | ICD-10-CM

## 2014-07-13 LAB — COMPREHENSIVE METABOLIC PANEL
ALK PHOS: 182 U/L — AB (ref 39–117)
ALT: 24 U/L (ref 0–35)
AST: 27 U/L (ref 0–37)
Albumin: 3.8 g/dL (ref 3.5–5.2)
BUN: 10 mg/dL (ref 6–23)
CO2: 24 mEq/L (ref 19–32)
Calcium: 8.3 mg/dL — ABNORMAL LOW (ref 8.4–10.5)
Chloride: 108 mEq/L (ref 96–112)
Creat: 0.71 mg/dL (ref 0.50–1.10)
GLUCOSE: 93 mg/dL (ref 70–99)
Potassium: 4.2 mEq/L (ref 3.5–5.3)
SODIUM: 141 meq/L (ref 135–145)
TOTAL PROTEIN: 6.2 g/dL (ref 6.0–8.3)
Total Bilirubin: 0.3 mg/dL (ref 0.2–1.2)

## 2014-07-13 NOTE — Progress Notes (Signed)
Margaret Porter is here for A50 [facility] study, week 66. We reviewed vs2.2 informed consent together. I fully explained the consent, risks, benefits, responsibilities, and answered questions. Participant verbalized understanding and signed the consent witnessed by me. I gave a copy of the signed consent to participant. She denies any new problems or symptoms but mentioned her left hip felt better. Questionnaire completed; hair sample obtained; Fasting labs obtained and vital signs stable. She had mentioned discussing ARV changes with Dr. Megan Salon in August. She received $50 gift card and we will call her in [place] when she knows more about what her work schedule will look like. Eliezer Champagne RN

## 2014-08-16 ENCOUNTER — Encounter: Payer: Self-pay | Admitting: Internal Medicine

## 2014-08-16 ENCOUNTER — Ambulatory Visit (INDEPENDENT_AMBULATORY_CARE_PROVIDER_SITE_OTHER): Payer: BC Managed Care – PPO | Admitting: Internal Medicine

## 2014-08-16 VITALS — BP 118/81 | HR 88 | Temp 97.2°F | Wt 246.0 lb

## 2014-08-16 DIAGNOSIS — Z23 Encounter for immunization: Secondary | ICD-10-CM

## 2014-08-16 DIAGNOSIS — B20 Human immunodeficiency virus [HIV] disease: Secondary | ICD-10-CM

## 2014-08-16 MED ORDER — ELVITEG-COBIC-EMTRICIT-TENOFDF 150-150-200-300 MG PO TABS: 1.0000 | ORAL_TABLET | Freq: Every day | ORAL | Status: DC

## 2014-08-16 NOTE — Progress Notes (Signed)
Patient ID: Margaret Porter, female   DOB: 03/09/1970, 44 y.o.   MRN: [id number]          Patient Active Problem List   Diagnosis Date Noted  . HIV DISEASE 01/24/2007    Priority: High  . Abnormal dreams 01/06/2014    Priority: Medium  . HIP PAIN, LEFT 05/04/2008    Priority: Medium  . Acute URI 10/14/2012  . Rosacea 04/22/2012  . URINARY INCONTINENCE, URGE 10/24/2010  . HYPERLIPIDEMIA 11/06/2007  . OBESITY 11/06/2007  . 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  Previous Medications   CETIRIZINE HCL (ZYRTEC PO)    Take 1 tablet by mouth once a day as needed for itching.    IBUPROFEN (ADVIL,MOTRIN) 200 MG TABLET    Take 200 mg by mouth every 6 (six) hours as needed.  Modified Medications   No medications on file  Discontinued Medications   ATRIPLA 600-200-300 MG PER TABLET    TAKE 1 TABLET BY MOUTH ONCE DAILY    Subjective: Margaret Porter is in for her routine visit. She has had problems falling asleep and staying asleep recently and wonders if it may be related to Atripla. She wants to try an alternative regimen. She has not missed any doses. She has tried taking Benadryl and other over-the-counter sleep aids without much success. She does not drink any caffeinated drinks. She denies feeling stressed, anxious or depressed. She is not getting regular exercise. She also continues to have very vivid and frequent dreams. She no longer has a primary care provider.  Review of Systems: Pertinent items are noted in HPI.  Past Medical History  Diagnosis Date  . HIV infection     History  Substance Use Topics  . Smoking status: Never Smoker   . Smokeless tobacco: Never Used  . Alcohol Use: No     Comment: occassional    Family History  Problem Relation Age of Onset  . Diabetes Mother   . Diabetes  Brother   . Heart disease Paternal Uncle   . Heart disease Maternal Grandmother   . Cancer Maternal Grandmother   . Heart disease Paternal Grandmother     Allergies  Allergen Reactions  . Amoxicillin     REACTION: rash  . Sulfamethoxazole-Trimethoprim     REACTION: rash  . Aspirin Anxiety    Objective: Temp: 97.2 F (36.2 C) (08/17 1612) Temp src: Oral (08/17 1612) BP: 118/81 mmHg (08/17 1612) Pulse Rate: 88 (08/17 1612) Body mass index is 43.59 kg/(m^2).  General: She is smiling and in good spirits Oral: No oropharyngeal lesions Skin: No rash Lungs: Clear Cor: Regular S1 and S2 with no murmurs  Lab Results Lab Results  Component Value Date   WBC 5.1 04/06/2014   HGB 11.8* 04/06/2014   HCT 35.9* 04/06/2014   MCV 83.9 04/06/2014   PLT 302 04/06/2014    Lab Results  Component Value Date   CREATININE 0.71 07/13/2014   BUN 10 07/13/2014   NA 141 07/13/2014   K 4.2 07/13/2014   CL 108 07/13/2014   CO2 24 07/13/2014    Lab Results  Component Value Date   ALT 24 07/13/2014   AST 27 07/13/2014   ALKPHOS 182* 07/13/2014   BILITOT 0.3 07/13/2014    Lab Results  Component Value Date   CHOL 183 04/06/2014   HDL 51 04/06/2014  LDLCALC 104* 04/06/2014   TRIG 142 04/06/2014   CHOLHDL 3.6 04/06/2014    Lab Results HIV 1 RNA Quant (copies/mL)  Date Value  07/24/2010 39   03/01/2010 39   08/04/2009 39      HIV-1 RNA Viral Load (no units)  Date Value  12/21/2013 <40   07/31/2013 <40   02/26/2013 <40      CD4 (no units)  Date Value  12/21/2013 475   07/31/2013 531   02/26/2013 494      CD4 T Cell Abs (cmm)  Date Value  04/12/2009 390*  08/14/2004 19      Assessment: It is possible that her sleep disturbance is due to Atripla. Certainly her vivid dreams are a likely side effect. I will switch her to Stribild.  Plan: 1. Change Atripla to Stribild 2. Followup in 6 months 3. She will reestablish primary care   Michel Bickers, MD [facility] for [place] [phone] pager   [phone] cell 08/16/2014, 5:01 PM

## 2014-08-19 ENCOUNTER — Encounter: Payer: Self-pay | Admitting: Infectious Disease

## 2014-08-19 LAB — HIV-1 RNA QUANT-NO REFLEX-BLD: HIV-1 RNA Viral Load: <40

## 2014-08-30 ENCOUNTER — Encounter: Payer: Self-pay | Admitting: Infectious Disease

## 2014-10-13 ENCOUNTER — Encounter: Payer: Self-pay | Admitting: Internal Medicine

## 2014-11-11 ENCOUNTER — Telehealth: Payer: Self-pay | Admitting: *Deleted

## 2014-11-11 NOTE — Telephone Encounter (Signed)
Name of PCP recommendation, Dr. Rita Ohara, telephone number given to pt.  Started having headaches after starting Stribild.  Requesting appt to discuss.  Pt scheduled for appt w/ Dr. Megan Salon.

## 2014-11-15 ENCOUNTER — Ambulatory Visit (INDEPENDENT_AMBULATORY_CARE_PROVIDER_SITE_OTHER): Payer: BC Managed Care – PPO | Admitting: Medical

## 2014-11-15 ENCOUNTER — Encounter: Payer: Self-pay | Admitting: Medical

## 2014-11-15 VITALS — BP 118/78 | HR 68 | Temp 98.5°F | Resp 18 | Wt 245.0 lb

## 2014-11-15 DIAGNOSIS — R748 Abnormal levels of other serum enzymes: Secondary | ICD-10-CM

## 2014-11-15 DIAGNOSIS — B20 Human immunodeficiency virus [HIV] disease: Secondary | ICD-10-CM

## 2014-11-15 DIAGNOSIS — R519 Headache, unspecified: Secondary | ICD-10-CM

## 2014-11-15 DIAGNOSIS — E669 Obesity, unspecified: Secondary | ICD-10-CM

## 2014-11-15 DIAGNOSIS — M722 Plantar fascial fibromatosis: Secondary | ICD-10-CM

## 2014-11-15 DIAGNOSIS — R51 Headache: Secondary | ICD-10-CM

## 2014-11-15 DIAGNOSIS — M79671 Pain in right foot: Secondary | ICD-10-CM

## 2014-11-15 DIAGNOSIS — M546 Pain in thoracic spine: Secondary | ICD-10-CM

## 2014-11-15 DIAGNOSIS — M79672 Pain in left foot: Secondary | ICD-10-CM

## 2014-11-15 MED ORDER — CYCLOBENZAPRINE HCL 10 MG PO TABS: 10.0000 mg | ORAL_TABLET | Freq: Every evening | ORAL | Status: DC | PRN

## 2014-11-15 NOTE — Progress Notes (Signed)
Subjective: Here as a new patient today.  She has complaints of back pain, heel pain, and headache.  Her history is significant for HIV disease, sees Dr. Megan Salon with infectious disease.  Back pain-Started last week. She notes a history of intermittent back pain in the past.  She woke up with pain in the midthoracic region over week ago, couldn't get in a comfortable position, went on for 2 days then.   Movement makes it worse but can't get in a comfortable position. massage did help.  Used some ibuprofen over the counter.denies trauma, injury, fall. Denies radiation to arms or legs, no paresthesias weakness numbness of the arms or legs, no loss of bowel or bladder should. Denies fever.  Works as a Stage manager.  No prior xrays.    Heel pain-has had heel pain issues for 20 years, was diagnosed with heel spurs in the remote past, had shots in the heels. Recently worse pain last month, wakes up with pain takes 15 minutes to be able to walk due to pain. Mostly in the heels. movement improves the pain.no recent evaluation with podiatry orthopedics. No other treatment.  Headache-started 3 weeks ago, consistent for 2 weeks, still having headaches intermittent. She wakes up with headaches. Located over the front and temples, pressure feeling.  Thought she was getting a head cold at the same time but no history of chronic sinus problems.the headache pain worsened throughout the day, use ibuprofen over-the-counter for this.  She has been under more stress, her brother just moved in with her recently. She doesn't eat consistently, sometimes skips meals, sometimes thinks her sugar runs low.  His recent change to new medication August for HIV from Atripla to [place].  Had an episode of nausea and vomiting last Friday.  At one point used Ibuprofen for a whole week.    ROS as in subjective   Objective: Filed Vitals:   11/15/14 0844  BP: 118/78  Pulse: 68  Temp: 98.5 F (36.9 C)  Resp: 18     General appearance: alert, no distress, WD/WN  HEENT: normocephalic, sclerae anicteric, PERRLA, EOMi, nares patent, no discharge or erythema, pharynx normal Oral cavity: MMM, no lesions Neck: supple, no lymphadenopathy, no thyromegaly, no masses Heart: RRR, normal S1, S2, no murmurs Lungs: CTA bilaterally, no wheezes, rhonchi, or rales Abdomen: +bs, soft, non tender, non distended, no masses, no hepatomegaly, no splenomegaly Back: non tender, no scoliosis, normal range of motion without pain Musculoskeletal: arms and legs nontender, no swelling, no obvious deformity Extremities: no edema, no cyanosis, no clubbing Pulses: 2+ symmetric, upper and lower extremities, normal cap refill Neurological: bilateral ptosis, alert, oriented x 3, CN2-12 intact, strength normal upper extremities and lower extremities, sensation normal throughout, DTRs 2+ throughout, no cerebellar signs, gait normal Psychiatric: normal affect, behavior normal, pleasant    Assessment: Encounter Diagnoses  Name Primary?  . Left-sided thoracic back pain Yes  . Generalized headaches   . Heel pain, bilateral   . Plantar fasciitis, bilateral   . HIV disease   . Obesity   . Elevated alkaline phosphatase level      Plan: thoracic back pain - declines x-ray at this time, begin heat, massage, stretching, and start routine exercising. She can use short-term Flexeril when necessary at bedtime, discussed risk and benefits of medication.  If not much improved within the next 2 weeks then get x-ray  headaches-likely stress related, possibly side effect of medication, possibly related to decreased sleep. Discussed stress reduction, working on  sleep hygiene, and we'll see if the Flexeril helps at all with this  Heel pain, plantar fasciitis-discussed diagnosis, treatment, begin daily tennis ball massage and towel stretches first thing in the morning, begin foot splints at bedtime, prescription given for this. Recheck in a  month  HIV disease-recently changed to Stribild, reviewed last infectious disease office note and labs  obesity-may also be in part related to her back pain  elevated alkaline phosphatase - noted in prior records.   Return soon to further evaluate.  Follow-up in 2 weeks

## 2014-11-22 ENCOUNTER — Telehealth: Payer: Self-pay | Admitting: Medical

## 2014-11-22 NOTE — Telephone Encounter (Signed)
Patient is aware of Dorothea Ogle PAc message and She has an appointment for 11/29/14. CLS

## 2014-11-22 NOTE — Telephone Encounter (Signed)
Have her f/u in 2 wk as planned on issues we discussed and to further evaluate prior labs showing elevated alkaline phosphatase.   I reviewed her chart and spoke to Dr. Megan Salon about this.

## 2014-11-24 ENCOUNTER — Ambulatory Visit: Payer: BC Managed Care – PPO | Admitting: Internal Medicine

## 2014-11-29 ENCOUNTER — Encounter: Payer: Self-pay | Admitting: Medical

## 2014-11-29 ENCOUNTER — Ambulatory Visit (INDEPENDENT_AMBULATORY_CARE_PROVIDER_SITE_OTHER): Payer: BC Managed Care – PPO | Admitting: Medical

## 2014-11-29 VITALS — BP 110/70 | HR 76 | Temp 98.1°F | Resp 14 | Wt 247.0 lb

## 2014-11-29 DIAGNOSIS — R748 Abnormal levels of other serum enzymes: Secondary | ICD-10-CM

## 2014-11-29 DIAGNOSIS — M722 Plantar fascial fibromatosis: Secondary | ICD-10-CM

## 2014-11-29 DIAGNOSIS — R51 Headache: Secondary | ICD-10-CM

## 2014-11-29 DIAGNOSIS — Z23 Encounter for immunization: Secondary | ICD-10-CM

## 2014-11-29 DIAGNOSIS — J309 Allergic rhinitis, unspecified: Secondary | ICD-10-CM

## 2014-11-29 DIAGNOSIS — M546 Pain in thoracic spine: Secondary | ICD-10-CM

## 2014-11-29 DIAGNOSIS — Q159 Congenital malformation of eye, unspecified: Secondary | ICD-10-CM

## 2014-11-29 DIAGNOSIS — B20 Human immunodeficiency virus [HIV] disease: Secondary | ICD-10-CM

## 2014-11-29 DIAGNOSIS — R519 Headache, unspecified: Secondary | ICD-10-CM

## 2014-11-29 LAB — HEPATIC FUNCTION PANEL
ALT: 20 U/L (ref 0–35)
AST: 23 U/L (ref 0–37)
Albumin: 3.9 g/dL (ref 3.5–5.2)
Alkaline Phosphatase: 170 U/L — ABNORMAL HIGH (ref 39–117)
Bilirubin, Direct: <0.1 mg/dL (ref 0.0–0.3)
TOTAL PROTEIN: 6.6 g/dL (ref 6.0–8.3)
Total Bilirubin: 0.2 mg/dL (ref 0.2–1.2)

## 2014-11-29 LAB — CBC
HEMATOCRIT: 37.4 % (ref 36.0–46.0)
Hemoglobin: 11.6 g/dL — ABNORMAL LOW (ref 12.0–15.0)
MCH: 26.5 pg (ref 26.0–34.0)
MCHC: 31 g/dL (ref 30.0–36.0)
MCV: 85.6 fL (ref 78.0–100.0)
MPV: 10.2 fL (ref 9.4–12.4)
Platelets: 301 10*3/uL (ref 150–400)
RBC: 4.37 MIL/uL (ref 3.87–5.11)
RDW: 16.4 % — AB (ref 11.5–15.5)
WBC: 5.3 10*3/uL (ref 4.0–10.5)

## 2014-11-29 LAB — BASIC METABOLIC PANEL
BUN: 9 mg/dL (ref 6–23)
CHLORIDE: 105 meq/L (ref 96–112)
CO2: 25 meq/L (ref 19–32)
CREATININE: 0.8 mg/dL (ref 0.50–1.10)
Calcium: 8.8 mg/dL (ref 8.4–10.5)
GLUCOSE: 109 mg/dL — AB (ref 70–99)
Potassium: 4.7 mEq/L (ref 3.5–5.3)
Sodium: 138 mEq/L (ref 135–145)

## 2014-11-29 LAB — GAMMA GT: GGT: 27 U/L (ref 7–51)

## 2014-11-29 NOTE — Progress Notes (Signed)
Subjective: Here for f/u on multiple issues and some new issues.  Here to discuss elevated ALP lab that we noticed reviewing her chart.  Hasn't had evaluation for this. She denies abdominal pain, prior liver problems, no specific bone pain, no fever, night sweats, recent unexplained weight changes.   She wasn't aware of this lab being abnormal  She reports left eye with "cyst" x several months, causes irritation of the eye, eye watering and blurred vision.  One prior eye doctor tried to lance it, but she was scared of the needle and they weren't able to do anything with it.  No worse or better, the same as prior.    She gets allergies,  watery eyes, blurred vision, some sneezing.  Brother smokes cigars. She asked him to smoke outside.   Takes either Benadryl or Zyrtec OTC.  On rare occasion gets hives, but this hasn't happened in a while.  Headaches - discussed last visit at her new patient visit.  Headaches are mostly resolved.  Thinks it was getting use to new HIV medication.   Once she started taking the medication with food, headaches have improved.  Gets some headaches end of the day, worse if she hasn't eaten.    HIV disease - sees infectious disease tomorrow.  Plantar fascitis - waiting for splints to come in, she ordered these online.  Using stretches we discussed daily, getting some relief.  Seeing improvement.    Back pain - less severe than last visit. Now only gets it with certain positions in sleep.  No night sweats, no fever.     Objective: BP 110/70 mmHg  Pulse 76  Temp(Src) 98.1 F (36.7 C) (Oral)  Resp 14  Wt 247 lb (112.038 kg)  Gen: wd, wn, nad  HEENT: normocephalic, sclerae anicteric, PERRLA, EOMi, left eye with 69mm raised clear papule adjoining sclera and iris inferiorly, otherwise nares patent, no discharge or erythema, pharynx normal Oral cavity: MMM, no lesions Neck: supple, no lymphadenopathy, no thyromegaly, no masses Heart: RRR, normal S1, S2, no  murmurs Lungs: CTA bilaterally, no wheezes, rhonchi, or rales Abdomen: +bs, soft, non tender, non distended, no masses, no hepatomegaly, no splenomegaly Back: non tender, no scoliosis, normal range of motion without pain Musculoskeletal: arms and legs nontender, no swelling, no obvious deformity Extremities: no edema, no cyanosis, no clubbing Pulses: 2+ symmetric, upper and lower extremities, normal cap refill Neurological: bilateral ptosis, alert, oriented x 3, CN2-12 intact, strength normal upper extremities and lower extremities, sensation normal throughout, DTRs 2+ throughout, no cerebellar signs, gait normal Psychiatric: normal affect, behavior normal, pleasant     Assessment:  Encounter Diagnoses  Name Primary?  . Elevated alkaline phosphatase level Yes  . Flu vaccine need   . Pain in thoracic spine   . Generalized headaches   . Bilateral plantar fasciitis   . HIV disease   . Eye anomaly   . Allergic rhinitis, unspecified allergic rhinitis type     ALP  Elevated - discussed diagnosis, possible causes, additional lab work up today.   Consider US abdomen or bone scan going further pending labs.   Counseled on the influenza virus vaccine.  Vaccine information sheet given.  Influenza vaccine given after consent obtained.  Thoracic back pain - didn't tolerate flexeril at 10mg  dose.  Overall improved.     Headaches - mostly improved since taking her HIV medication with food instead of fasting.    discussed sleep hygiene, avoid skipping meals, will work on better allergy control.  bilat plantar fascitis - c/t daily stretches as discussed, use night time splints when they arrive from mail order  HIV disease - f/u with infectious disease as planned  Eye anomaly - see eye doctor soon  Allergic rhinitis - c/t Zyretc or other OTC antihistamine daily, f/u if not improving

## 2014-11-30 ENCOUNTER — Other Ambulatory Visit: Payer: Self-pay | Admitting: Medical

## 2014-11-30 ENCOUNTER — Ambulatory Visit: Payer: BC Managed Care – PPO | Admitting: Internal Medicine

## 2014-11-30 DIAGNOSIS — R748 Abnormal levels of other serum enzymes: Secondary | ICD-10-CM

## 2014-11-30 LAB — PTH, INTACT AND CALCIUM
CALCIUM: 8.8 mg/dL (ref 8.4–10.5)
PTH: 139 pg/mL — ABNORMAL HIGH (ref 14–64)

## 2014-11-30 LAB — VITAMIN D 25 HYDROXY (VIT D DEFICIENCY, FRACTURES): Vit D, 25-Hydroxy: 12 ng/mL — ABNORMAL LOW (ref 30–100)

## 2014-11-30 LAB — TSH: TSH: 1.985 u[IU]/mL (ref 0.350–4.500)

## 2014-11-30 MED ORDER — VITAMIN D (ERGOCALCIFEROL) 1.25 MG (50000 UNIT) PO CAPS: 50000.0000 [IU] | ORAL_CAPSULE | ORAL | Status: DC

## 2014-12-01 ENCOUNTER — Telehealth: Payer: Self-pay

## 2014-12-01 NOTE — Telephone Encounter (Signed)
Bone scan [facility] 12/07/14 @ 12 noon

## 2014-12-01 NOTE — Telephone Encounter (Signed)
Bone Scan Whole Body scheduled at [place] [facility] 12/07/14 @ 11:45 for 12 noon appointment, patient will receive injection and then need to return at 2:45. Pt aware, pre cert has not been done.

## 2014-12-07 ENCOUNTER — Ambulatory Visit (HOSPITAL_COMMUNITY)
Admission: RE | Admit: 2014-12-07 | Discharge: 2014-12-07 | Disposition: A | Discharge status: Home / Self Care | Payer: BC Managed Care – PPO | Source: Ambulatory Visit | Attending: Medical | Admitting: Medical

## 2014-12-07 ENCOUNTER — Encounter (HOSPITAL_COMMUNITY)
Admission: RE | Admit: 2014-12-07 | Discharge: 2014-12-07 | Disposition: A | Discharge status: Home / Self Care | Payer: BC Managed Care – PPO | Source: Ambulatory Visit | Attending: Medical | Admitting: Medical

## 2014-12-07 DIAGNOSIS — R748 Abnormal levels of other serum enzymes: Secondary | ICD-10-CM | POA: Diagnosis not present

## 2014-12-07 MED ORDER — TECHNETIUM TC 99M MEDRONATE IV KIT
25.0000 | PACK | Freq: Once | INTRAVENOUS | Status: AC | PRN
Start: 2014-12-07 — End: 2014-12-07
  Administered 2014-12-07: 25 via INTRAVENOUS

## 2015-01-20 ENCOUNTER — Ambulatory Visit (INDEPENDENT_AMBULATORY_CARE_PROVIDER_SITE_OTHER): Payer: BC Managed Care – PPO | Admitting: Family Medicine

## 2015-01-20 ENCOUNTER — Encounter: Payer: Self-pay | Admitting: Family Medicine

## 2015-01-20 VITALS — BP 120/64 | HR 72 | Temp 98.7°F | Ht 63.0 in | Wt 253.0 lb

## 2015-01-20 DIAGNOSIS — R05 Cough: Secondary | ICD-10-CM

## 2015-01-20 DIAGNOSIS — J069 Acute upper respiratory infection, unspecified: Secondary | ICD-10-CM

## 2015-01-20 DIAGNOSIS — J309 Allergic rhinitis, unspecified: Secondary | ICD-10-CM

## 2015-01-20 DIAGNOSIS — R059 Cough, unspecified: Secondary | ICD-10-CM

## 2015-01-20 MED ORDER — BENZONATATE 200 MG PO CAPS: 200.0000 mg | ORAL_CAPSULE | Freq: Three times a day (TID) | ORAL | Status: DC | PRN

## 2015-01-20 NOTE — Progress Notes (Signed)
Chief Complaint  Patient presents with  . Cough    since mid December. Chest and nasal congestion.   In mid-December she started with a cough. 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  Over the holiday, while on break, her cough got worse.  It finally improved, never completely resolved, but recently got worse again, since being back at school.  Last week started having runny nose, head congestion, sore throat, postnasal drainage and recurrent cough.  Nasal mucus is clear.  Sinus pain is only intermittent.  Cough sometimes is productive of phlegm--initially it was green, but now it is white.  She has some chest tightness, but no shortness of breath.  She feels a little chilled when she first gets home, then gets hot.  Coughs more when she gets hot. Hasn't checked her temperature.  She restarted taking Mucinex DM (4 hour kind)  this week (as well as some robitussin for cough and flu)--this helps control the cough.  She is very sensitive to the kids at school--their perfumes, lotions, etc causes her to have allergies.  Uses zyrtec or benadryl only prn, not daily.  Hasn't taken any today (since not at school today).  Used it three times last week, helps some.  Past Medical History  Diagnosis Date  . HIV infection    History reviewed. No pertinent past surgical history.  History   Social History  . Marital Status: Single    Spouse Name: N/A    Number of Children: N/A  . Years of Education: N/A   Occupational History  . Not on file.   Social History Main Topics  . Smoking status: Never Smoker   . Smokeless tobacco: Never Used  . Alcohol Use: 0.0 oz/week    0 Not specified per week     Comment: occassional (special occasions)  . Drug Use: No  . Sexual Activity: Yes     Comment: given condoms   Other Topics Concern  . 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   Brother is temporarily living with her.   Single, no children   Outpatient Encounter Prescriptions as of 01/20/2015  Medication Sig Note  . Cetirizine HCl (ZYRTEC PO) Take 1 tablet by mouth once a day as needed for itching.    [name] Kitchen dextromethorphan-guaiFENesin (MUCINEX DM) 30-600 MG per 12 hr tablet Take 1 tablet by mouth 2 (two) times daily.   . DiphenhydrAMINE HCl (BENADRYL ALLERGY PO) Take by mouth.   . elvitegravir-cobicistat-emtricitabine-tenofovir (STRIBILD) 150-150-200-300 MG TABS tablet Take 1 tablet by mouth daily with breakfast.   . Vitamin D, Ergocalciferol, (DRISDOL) 50000 UNITS CAPS capsule Take 1 capsule (50,000 Units total) by mouth every 7 (seven) days.   [name] Kitchen ibuprofen (ADVIL,MOTRIN) 200 MG tablet Take 200 mg by mouth every 6 (six) hours as needed. 11/29/2014: Prn    Allergies  Allergen Reactions  . Amoxicillin     REACTION: rash  . Sulfamethoxazole-Trimethoprim     REACTION: rash  . Aspirin Anxiety   ROS:  +chills, no known fevers.  Headaches related to scents/allergies. No dizziness.  No shortness of breath.  No exertional chest pain, palpitations, nausea (just heat-related, early in December), vomiting, diarrhea.  She has had some post-tussive emesis. Denies bleeding, bruising, rash.  PHYSICAL EXAM: BP 120/64 mmHg  Pulse 72  Temp(Src) 98.7 F (37.1 C) (Tympanic)  Ht 5\' 3"  (1.6 m)  Wt 253 lb (114.76  kg)  BMI 44.83 kg/m2  LMP 12/31/2014 Well-appearing female with occasional dry cough, in no distress.  Speaking easily in full sentences. HEENT: PERRL, EOMI, conjunctiva clear. TM's and EAC;'s normal.  Nasal mucosa is moderately edematous, pale, with clear/white and a little bit of light yellow mucus. No erythema.  Sinuses are nontender.  OP--there is a small petechial area on the right posterior soft palate.  Otherwise completely normal, no erythema or exudates, no cobblestoning. Neck: no lymphadenopathy or mass Heart: regular rate and rhythm without murmur Lungs: clear bilaterally.  Good air movement. No wheezes,  rales or ronchi  ASSESSMENT/PLAN:  Acute upper respiratory infection  Allergic rhinitis, unspecified allergic rhinitis type  Cough - Plan: benzonatate (TESSALON) 200 MG capsule   Likely you had a recent cold, with some ongoing allergy symptoms, with postnasal drainage contributing to your cough. Continue to drink plenty of fluids. Take the zyrtec once daily every day. Take guaifenesin (in Mucinex or robitussin--don't overlap medications, as you might duplicate ingredients).  This acts as an expectorant to keep the mucus thin, which help with the cough. You may use the DM version (containing dextromethorphan)--this is a cough suppressant, or get separate Delsym syrup, which has this ingredient by itself. Use the [employer] as needed for cough.  This is simply a cough medication; this doesn't usually cause drowsiness, so it okay to take during the day.  Return if you develop fever, pain with breathing, discolored mucus/phlegm, or any other new or worsening symptoms.

## 2015-01-20 NOTE — Patient Instructions (Signed)
  Likely you had a recent cold, with some ongoing allergy symptoms, with postnasal drainage contributing to your cough. Continue to drink plenty of fluids. Take the zyrtec once daily every day. Take guaifenesin (in Mucinex or robitussin--don't overlap medications, as you might duplicate ingredients).  This acts as an expectorant to keep the mucus thin, which help with the cough. You may use the DM version (containing dextromethorphan)--this is a cough suppressant, or get separate Delsym syrup, which has this ingredient by itself. Use the [employer] as needed for cough.  This is simply a cough medication; this doesn't usually cause drowsiness, so it okay to take during the day.  Return if you develop fever, pain with breathing, discolored mucus/phlegm, or any other new or worsening symptoms.

## 2015-02-08 ENCOUNTER — Ambulatory Visit (INDEPENDENT_AMBULATORY_CARE_PROVIDER_SITE_OTHER): Payer: BC Managed Care – PPO | Admitting: *Deleted

## 2015-02-08 VITALS — BP 116/78 | HR 75 | Temp 98.7°F | Resp 16 | Wt 255.8 lb

## 2015-02-08 DIAGNOSIS — Z006 Encounter for examination for normal comparison and control in clinical research program: Secondary | ICD-10-CM

## 2015-02-08 LAB — COMPREHENSIVE METABOLIC PANEL
ALBUMIN: 3.6 g/dL (ref 3.5–5.2)
ALK PHOS: 129 U/L — AB (ref 39–117)
ALT: 17 U/L (ref 0–35)
AST: 22 U/L (ref 0–37)
BUN: 9 mg/dL (ref 6–23)
CALCIUM: 8.8 mg/dL (ref 8.4–10.5)
CHLORIDE: 108 meq/L (ref 96–112)
CO2: 24 mEq/L (ref 19–32)
Creat: 0.78 mg/dL (ref 0.50–1.10)
Glucose, Bld: 103 mg/dL — ABNORMAL HIGH (ref 70–99)
POTASSIUM: 4.4 meq/L (ref 3.5–5.3)
SODIUM: 140 meq/L (ref 135–145)
TOTAL PROTEIN: 6.1 g/dL (ref 6.0–8.3)
Total Bilirubin: 0.3 mg/dL (ref 0.2–1.2)

## 2015-02-08 LAB — HEPATITIS C ANTIBODY: HCV Ab: NEGATIVE

## 2015-02-08 NOTE — Progress Notes (Signed)
Margaret Porter is here for her week 54 A5321 study visit. It has been awhile since she came in for a study visit, she had missed the last A5322 week 72  Visit. She has been seeing a primary care physician who has evaluated her for elevated alkaline phosphate, bone scan was normal, PTH level is elevated. She c/o bilateral foot/heel pain, chronic cough. She will return in March for the next visit for A5322 week 96.

## 2015-03-14 ENCOUNTER — Other Ambulatory Visit: Payer: Self-pay | Admitting: *Deleted

## 2015-03-14 ENCOUNTER — Ambulatory Visit (INDEPENDENT_AMBULATORY_CARE_PROVIDER_SITE_OTHER): Payer: BC Managed Care – PPO | Admitting: Family Medicine

## 2015-03-14 ENCOUNTER — Encounter: Payer: Self-pay | Admitting: Family Medicine

## 2015-03-14 VITALS — BP 130/80 | HR 77 | Wt 257.0 lb

## 2015-03-14 DIAGNOSIS — Z113 Encounter for screening for infections with a predominantly sexual mode of transmission: Secondary | ICD-10-CM

## 2015-03-14 DIAGNOSIS — R079 Chest pain, unspecified: Secondary | ICD-10-CM

## 2015-03-14 NOTE — Progress Notes (Signed)
   Subjective:    Patient ID: Margaret Porter, female    DOB: February 01, 1970, 45 y.o.   MRN: [id number]  HPI She is here for consult concerning the sudden onset of left upper chest pain with radiation into her back and slight sweating and shortness of breath. It lasted for only 15 minutes. Prior to this she was exposed to a strong perfume. She has a previous history of difficulty with perfumes causing coughing and occasional wheezing. She usually treats this with Benadryl and gets results within 15 minutes. She has been dealing with a chronic cough since late December that has been slowly getting better. No fever, chills, sore throat or productive cough.   Review of Systems     Objective:   Physical Exam Alert and in no distress. Cardiac exam shows regular rhythm without murmurs or gallops. Lungs are clear to auscultation. There is no chest wall tenderness.       Assessment & Plan:  Chest pain, unspecified chest pain type I reassured her that since the pain when away that quickly, it is very unlikely that this is cardiac in origin. Most likely related to exposure to a noxious odor causing her to cough and subsequently having some slight chest discomfort. She was comfortable with this. She is to return here if any symptoms change.

## 2015-03-16 ENCOUNTER — Telehealth: Payer: Self-pay | Admitting: Family Medicine

## 2015-03-16 NOTE — Telephone Encounter (Signed)
Call up and tell her she needs to be seen. She probably needs an x-ray

## 2015-03-16 NOTE — Telephone Encounter (Signed)
Patient is coming in the morning

## 2015-03-16 NOTE — Telephone Encounter (Signed)
Pt called and fever is now 102.2 and cough is worse.  Wants referral.  Pt ph [phone]

## 2015-03-17 ENCOUNTER — Ambulatory Visit (INDEPENDENT_AMBULATORY_CARE_PROVIDER_SITE_OTHER): Payer: BC Managed Care – PPO | Admitting: Family Medicine

## 2015-03-17 ENCOUNTER — Ambulatory Visit
Admission: RE | Admit: 2015-03-17 | Discharge: 2015-03-17 | Disposition: A | Discharge status: Home / Self Care | Payer: BC Managed Care – PPO | Source: Ambulatory Visit | Attending: Family Medicine | Admitting: Family Medicine

## 2015-03-17 ENCOUNTER — Encounter: Payer: Self-pay | Admitting: Family Medicine

## 2015-03-17 VITALS — BP 120/70 | HR 110 | Temp 101.5°F | Wt 255.0 lb

## 2015-03-17 DIAGNOSIS — J069 Acute upper respiratory infection, unspecified: Secondary | ICD-10-CM

## 2015-03-17 DIAGNOSIS — R05 Cough: Secondary | ICD-10-CM | POA: Diagnosis not present

## 2015-03-17 DIAGNOSIS — R059 Cough, unspecified: Secondary | ICD-10-CM

## 2015-03-17 LAB — CBC WITH DIFFERENTIAL/PLATELET
BASOS ABS: 0 10*3/uL (ref 0.0–0.1)
Basophils Relative: 0 % (ref 0–1)
EOS ABS: 0 10*3/uL (ref 0.0–0.7)
EOS PCT: 1 % (ref 0–5)
HEMATOCRIT: 37.4 % (ref 36.0–46.0)
Hemoglobin: 11.9 g/dL — ABNORMAL LOW (ref 12.0–15.0)
Lymphocytes Relative: 28 % (ref 12–46)
Lymphs Abs: 1 10*3/uL (ref 0.7–4.0)
MCH: 27.9 pg (ref 26.0–34.0)
MCHC: 31.8 g/dL (ref 30.0–36.0)
MCV: 87.6 fL (ref 78.0–100.0)
MONO ABS: 0.7 10*3/uL (ref 0.1–1.0)
MPV: 10.9 fL (ref 8.6–12.4)
Monocytes Relative: 19 % — ABNORMAL HIGH (ref 3–12)
NEUTROS PCT: 52 % (ref 43–77)
Neutro Abs: 1.9 10*3/uL (ref 1.7–7.7)
Platelets: 245 10*3/uL (ref 150–400)
RBC: 4.27 MIL/uL (ref 3.87–5.11)
RDW: 16.3 % — ABNORMAL HIGH (ref 11.5–15.5)
WBC: 3.6 10*3/uL — ABNORMAL LOW (ref 4.0–10.5)

## 2015-03-17 MED ORDER — CLARITHROMYCIN 500 MG PO TABS: 500.0000 mg | ORAL_TABLET | Freq: Two times a day (BID) | ORAL | Status: DC

## 2015-03-17 NOTE — Progress Notes (Signed)
Gave patient Neb. Treatment with one Albuterol Sulfate inh.sol.0.083% 2.5mg /21ml NDC: 2811-8867-73 LOT; [id number]  EXP; DEC 2016

## 2015-03-17 NOTE — Progress Notes (Signed)
   Subjective:    Patient ID: Margaret Porter, female    DOB: 10/26/1970, 45 y.o.   MRN: [id number]  HPI She is here for a recheck. She has a previous history of difficulty with URI/allergy symptoms started in December. She was seen in late January for this. She apparently has done fairly well until recently when she had the onset of chest discomfort and was seen 2 days ago. One day after being seen she developed shortness of breath, fever, coughing and fatigue. This has continued. No sore throat, earache   Review of Systems     Objective:   Physical Exam Alert and in no distress. Tympanic membranes and canals are normal. Pharyngeal area is normal. Neck is supple without adenopathy or thyromegaly. Cardiac exam shows a regular sinus rhythm without murmurs or gallops. Lungs Are clear on inspiration however expiration causes her to cough. The chest x-ray shows no acute changes but some hypoventilation and atelectatic basis.     Assessment & Plan:  Acute upper respiratory infection - Plan: CBC with Differential/Platelet, DG Chest 2 View, PR INHAL RX, AIRWAY OBST/DX SPUTUM INDUCT, PR ALBUTEROL IPRATROP NON-COMP, PR ALBUTEROL IPRATROP NON-COMP, clarithromycin (BIAXIN) 500 MG tablet  Cough - Plan: CBC with Differential/Platelet, DG Chest 2 View, clarithromycin (BIAXIN) 500 MG tablet  I will call her after the blood work. Recheck here in 10 days if no improvement.

## 2015-03-22 ENCOUNTER — Encounter: Payer: Self-pay | Admitting: Internal Medicine

## 2015-03-22 LAB — CD4/CD8 (T-HELPER/T-SUPPRESSOR CELL)
CD4%: 36.9
CD4: 443
CD8 % Suppressor T Cell: 42.3
CD8: 508

## 2015-03-22 LAB — HIV-1 RNA QUANT-NO REFLEX-BLD: HIV-1 RNA Viral Load: <40

## 2015-03-28 ENCOUNTER — Ambulatory Visit (INDEPENDENT_AMBULATORY_CARE_PROVIDER_SITE_OTHER): Payer: BC Managed Care – PPO | Admitting: *Deleted

## 2015-03-28 VITALS — BP 115/82 | HR 77 | Temp 98.6°F | Resp 18 | Ht 63.5 in | Wt 254.2 lb

## 2015-03-28 DIAGNOSIS — Z006 Encounter for examination for normal comparison and control in clinical research program: Secondary | ICD-10-CM

## 2015-03-28 LAB — CBC WITH DIFFERENTIAL/PLATELET
Basophils Absolute: 0 10*3/uL (ref 0.0–0.1)
Basophils Relative: 0 % (ref 0–1)
EOS PCT: 0 % (ref 0–5)
Eosinophils Absolute: 0 10*3/uL (ref 0.0–0.7)
HCT: 38.7 % (ref 36.0–46.0)
HEMOGLOBIN: 12.1 g/dL (ref 12.0–15.0)
LYMPHS ABS: 1.8 10*3/uL (ref 0.7–4.0)
Lymphocytes Relative: 24 % (ref 12–46)
MCH: 27.3 pg (ref 26.0–34.0)
MCHC: 31.3 g/dL (ref 30.0–36.0)
MCV: 87.2 fL (ref 78.0–100.0)
MONO ABS: 0.7 10*3/uL (ref 0.1–1.0)
MONOS PCT: 10 % (ref 3–12)
MPV: 10.1 fL (ref 8.6–12.4)
Neutro Abs: 4.8 10*3/uL (ref 1.7–7.7)
Neutrophils Relative %: 66 % (ref 43–77)
Platelets: 314 10*3/uL (ref 150–400)
RBC: 4.44 MIL/uL (ref 3.87–5.11)
RDW: 16.1 % — ABNORMAL HIGH (ref 11.5–15.5)
WBC: 7.3 10*3/uL (ref 4.0–10.5)

## 2015-03-28 LAB — LIPID PANEL
CHOLESTEROL: 142 mg/dL (ref 0–200)
HDL: 39 mg/dL — ABNORMAL LOW (ref >=46)
LDL Cholesterol: 80 mg/dL (ref 0–99)
TRIGLYCERIDES: 113 mg/dL (ref <150)
Total CHOL/HDL Ratio: 3.6 Ratio
VLDL: 23 mg/dL (ref 0–40)

## 2015-03-28 LAB — COMPREHENSIVE METABOLIC PANEL
ALBUMIN: 3.8 g/dL (ref 3.5–5.2)
ALT: 15 U/L (ref 0–35)
AST: 18 U/L (ref 0–37)
Alkaline Phosphatase: 121 U/L — ABNORMAL HIGH (ref 39–117)
BUN: 10 mg/dL (ref 6–23)
CALCIUM: 8.9 mg/dL (ref 8.4–10.5)
CHLORIDE: 105 meq/L (ref 96–112)
CO2: 24 mEq/L (ref 19–32)
CREATININE: 0.93 mg/dL (ref 0.50–1.10)
Glucose, Bld: 97 mg/dL (ref 70–99)
POTASSIUM: 4.4 meq/L (ref 3.5–5.3)
Sodium: 137 mEq/L (ref 135–145)
Total Bilirubin: 0.3 mg/dL (ref 0.2–1.2)
Total Protein: 6.3 g/dL (ref 6.0–8.3)

## 2015-03-28 NOTE — Progress Notes (Signed)
Margaret Porter is here for her week 86 a5322 study visit. She continues to have prod. Cough and she says that heat makes her feel like she can't get here breath. She also has a rt heel spur that is causing her quite a bit of discomfort now. She is overdue for a PAP and knows she needs to schedule an appt with Langley Gauss. She will return in June for the next study visit.

## 2015-03-29 LAB — HEPATITIS C ANTIBODY: HCV Ab: NEGATIVE

## 2015-03-29 LAB — CREATININE, URINE, RANDOM: Creatinine, Urine: 197 mg/dL

## 2015-03-29 LAB — HEMOGLOBIN A1C
Hgb A1c MFr Bld: 5.8 % — ABNORMAL HIGH (ref <5.7)
MEAN PLASMA GLUCOSE: 120 mg/dL — AB (ref <117)

## 2015-03-29 LAB — HEPATITIS B SURFACE ANTIGEN: Hepatitis B Surface Ag: NEGATIVE

## 2015-03-29 LAB — HEPATITIS B SURFACE ANTIBODY,QUALITATIVE: HEP B S AB: NEGATIVE

## 2015-03-29 LAB — PROTEIN, URINE, RANDOM: Total Protein, Urine: 36 mg/dL — ABNORMAL HIGH (ref 5–24)

## 2015-04-14 ENCOUNTER — Ambulatory Visit (INDEPENDENT_AMBULATORY_CARE_PROVIDER_SITE_OTHER): Payer: BC Managed Care – PPO | Admitting: Internal Medicine

## 2015-04-14 ENCOUNTER — Encounter: Payer: Self-pay | Admitting: Internal Medicine

## 2015-04-14 DIAGNOSIS — B20 Human immunodeficiency virus [HIV] disease: Secondary | ICD-10-CM

## 2015-04-14 MED ORDER — ELVITEG-COBIC-EMTRICIT-TENOFAF 150-150-200-10 MG PO TABS: 1.0000 | ORAL_TABLET | Freq: Every day | ORAL | Status: DC

## 2015-04-14 NOTE — Progress Notes (Signed)
Patient ID: Margaret Porter, female   DOB: Jun 11, 1970, 45 y.o.   MRN: [id number]          Patient Active Problem List   Diagnosis Date Noted  . Human immunodeficiency virus (HIV) disease 01/24/2007    Priority: High  . Abnormal dreams 01/06/2014    Priority: Medium  . HIP PAIN, LEFT 05/04/2008    Priority: Medium  . Rosacea 04/22/2012  . URINARY INCONTINENCE, URGE 10/24/2010  . HYPERLIPIDEMIA 11/06/2007  . OBESITY 11/06/2007  . URTICARIA, IDIOPATHIC 03/06/2007    Patient's Medications  New Prescriptions   ELVITEGRAVIR-COBICISTAT-EMTRICITABINE-TENOFOVIR (GENVOYA) 150-150-200-10 MG TABS TABLET    Take 1 tablet by mouth daily with breakfast.  Previous Medications   BENZONATATE (TESSALON) 200 MG CAPSULE    Take 1 capsule (200 mg total) by mouth 3 (three) times daily as needed.   CETIRIZINE HCL (ZYRTEC PO)    Take 1 tablet by mouth once a day as needed for itching.    CLARITHROMYCIN (BIAXIN) 500 MG TABLET    Take 1 tablet (500 mg total) by mouth 2 (two) times daily.   DEXTROMETHORPHAN-GUAIFENESIN (MUCINEX DM) 30-600 MG PER 12 HR TABLET    Take 1 tablet by mouth 2 (two) times daily.   DIPHENHYDRAMINE HCL (BENADRYL ALLERGY PO)    Take by mouth.   IBUPROFEN (ADVIL,MOTRIN) 200 MG TABLET    Take 200 mg by mouth every 6 (six) hours as needed.   VITAMIN D, ERGOCALCIFEROL, (DRISDOL) 50000 UNITS CAPS CAPSULE    Take 1 capsule (50,000 Units total) by mouth every 7 (seven) days.  Modified Medications   No medications on file  Discontinued Medications   ELVITEGRAVIR-COBICISTAT-EMTRICITABINE-TENOFOVIR (STRIBILD) 150-150-200-300 MG TABS TABLET    Take 1 tablet by mouth daily with breakfast.    Subjective: Margaret Porter is in for her routine HIV follow-up visit. 6 months ago I switched her from Atripla to [place] to see if her crazy dreams and insomnia would improve. Her dreams resolved promptly and she is sleeping better. She is also made some other changes such as turning the lights off in her  bedroom and turning the TV off. She takes the Stribild each day with lunch and has not missed any doses. She is tolerating it well. Review of Systems: Pertinent items are noted in HPI.  Past Medical History  Diagnosis Date  . HIV infection     History  Substance Use Topics  . Smoking status: Never Smoker   . Smokeless tobacco: Never Used  . Alcohol Use: 0.0 oz/week    0 Standard drinks or equivalent per week     Comment: occassional (special occasions)    Family History  Problem Relation Age of Onset  . Diabetes Mother   . Diabetes Brother   . Heart disease Paternal Uncle   . Heart disease Maternal Grandmother   . Cancer Maternal Grandmother   . Heart disease Paternal Grandmother     Allergies  Allergen Reactions  . Amoxicillin     REACTION: rash  . Sulfamethoxazole-Trimethoprim     REACTION: rash  . Aspirin Anxiety    Objective: Temp: 97.8 F (36.6 C) (04/14 0847) Temp Source: Oral (04/14 0847) BP: 131/91 mmHg (04/14 0847) Pulse Rate: 80 (04/14 0847) Body mass index is 44.98 kg/(m^2).  General: She is in good spirits Lungs: Clear Cor: Reg S1 and S2  Lab Results Lab Results  Component Value Date   WBC 7.3 03/28/2015   HGB 12.1 03/28/2015   HCT 38.7 03/28/2015  MCV 87.2 03/28/2015   PLT 314 03/28/2015    Lab Results  Component Value Date   CREATININE 0.93 03/28/2015   BUN 10 03/28/2015   NA 137 03/28/2015   K 4.4 03/28/2015   CL 105 03/28/2015   CO2 24 03/28/2015    Lab Results  Component Value Date   ALT 15 03/28/2015   AST 18 03/28/2015   ALKPHOS 121* 03/28/2015   BILITOT 0.3 03/28/2015    Lab Results  Component Value Date   CHOL 142 03/28/2015   HDL 39* 03/28/2015   LDLCALC 80 03/28/2015   TRIG 113 03/28/2015   CHOLHDL 3.6 03/28/2015    Lab Results HIV 1 RNA QUANT (copies/mL)  Date Value  07/24/2010 39  03/01/2010 39  08/04/2009 39   HIV-1 RNA VIRAL LOAD (no units)  Date Value  02/08/2015 <40  07/13/2014 <40  12/21/2013  <40   CD4 (no units)  Date Value  02/08/2015 443  12/21/2013 475  07/31/2013 531   CD4 T CELL ABS  Date Value  04/12/2009 390 cmm*  08/14/2004 19     Assessment: Her HIV infection remains under excellent control. I will change her the new preparation of Stribild called Genvoya.  Plan: 1. Start Genvoya 2. Follow-up in 6 months   Michel Bickers, MD [facility] [place] [phone] pager   [phone] cell 04/14/2015, 8:57 AM

## 2015-04-15 ENCOUNTER — Encounter: Payer: Self-pay | Admitting: Internal Medicine

## 2015-04-28 ENCOUNTER — Other Ambulatory Visit: Payer: Self-pay | Admitting: Medical

## 2015-06-14 ENCOUNTER — Encounter (HOSPITAL_COMMUNITY): Payer: Self-pay | Admitting: *Deleted

## 2015-06-16 ENCOUNTER — Telehealth: Payer: Self-pay | Admitting: Internal Medicine

## 2015-06-16 ENCOUNTER — Other Ambulatory Visit: Payer: Self-pay | Admitting: Obstetrics and Gynecology

## 2015-06-16 ENCOUNTER — Encounter: Payer: Self-pay | Admitting: Internal Medicine

## 2015-06-16 NOTE — Telephone Encounter (Signed)
Margaret Porter called and said that she is going to have a hysteroscopy for a "polyp" on the 27th and wanted to make sure you knew about it. Dr. Garwin Brothers will be doing the procedure.

## 2015-06-26 NOTE — Anesthesia Preprocedure Evaluation (Addendum)
Anesthesia Evaluation  Patient identified by MRN, date of birth, ID band Patient awake    Reviewed: Allergy & Precautions, NPO status , Patient's Chart, lab work & pertinent test results  History of Anesthesia Complications Negative for: history of anesthetic complications  Airway Mallampati: III  TM Distance: >3 FB Neck ROM: Full    Dental no notable dental hx. (+) Dental Advisory Given   Pulmonary neg pulmonary ROS,  breath sounds clear to auscultation  Pulmonary exam normal       Cardiovascular negative cardio ROS Normal cardiovascular examRhythm:Regular Rate:Normal     Neuro/Psych negative neurological ROS  negative psych ROS   GI/Hepatic negative GI ROS, Neg liver ROS,   Endo/Other  Morbid obesity  Renal/GU negative Renal ROS  negative genitourinary   Musculoskeletal negative musculoskeletal ROS (+)   Abdominal (+) + obese,   Peds negative pediatric ROS (+)  Hematology  (+) HIV, Family is unaware of HIV status   Anesthesia Other Findings   Reproductive/Obstetrics negative OB ROS Endometrial mass                            Anesthesia Physical Anesthesia Plan  ASA: III  Anesthesia Plan: General   Post-op Pain Management:    Induction: Intravenous  Airway Management Planned: LMA  Additional Equipment:   Intra-op Plan:   Post-operative Plan: Extubation in OR  Informed Consent: I have reviewed the patients History and Physical, chart, labs and discussed the procedure including the risks, benefits and alternatives for the proposed anesthesia with the patient or authorized representative who has indicated his/her understanding and acceptance.   Dental advisory given  Plan Discussed with: CRNA, Anesthesiologist and Surgeon  Anesthesia Plan Comments:        Anesthesia Quick Evaluation

## 2015-06-27 ENCOUNTER — Ambulatory Visit (HOSPITAL_COMMUNITY)
Admission: RE | Admit: 2015-06-27 | Discharge: 2015-06-27 | Disposition: A | Discharge status: Home / Self Care | Payer: BC Managed Care – PPO | Source: Ambulatory Visit | Attending: Obstetrics and Gynecology | Admitting: Obstetrics and Gynecology

## 2015-06-27 ENCOUNTER — Encounter (HOSPITAL_COMMUNITY)
Admission: RE | Disposition: A | Discharge status: Home / Self Care | Payer: Self-pay | Source: Ambulatory Visit | Attending: Obstetrics and Gynecology

## 2015-06-27 ENCOUNTER — Ambulatory Visit (HOSPITAL_COMMUNITY): Payer: BC Managed Care – PPO | Admitting: Anesthesiology

## 2015-06-27 ENCOUNTER — Encounter (HOSPITAL_COMMUNITY): Payer: Self-pay | Admitting: *Deleted

## 2015-06-27 DIAGNOSIS — Z6841 Body Mass Index (BMI) 40.0 and over, adult: Secondary | ICD-10-CM | POA: Diagnosis not present

## 2015-06-27 DIAGNOSIS — Z21 Asymptomatic human immunodeficiency virus [HIV] infection status: Secondary | ICD-10-CM | POA: Insufficient documentation

## 2015-06-27 DIAGNOSIS — N858 Other specified noninflammatory disorders of uterus: Secondary | ICD-10-CM | POA: Diagnosis present

## 2015-06-27 DIAGNOSIS — B20 Human immunodeficiency virus [HIV] disease: Secondary | ICD-10-CM

## 2015-06-27 DIAGNOSIS — N840 Polyp of corpus uteri: Secondary | ICD-10-CM | POA: Diagnosis not present

## 2015-06-27 HISTORY — PX: DILATATION & CURRETTAGE/HYSTEROSCOPY WITH RESECTOCOPE: SHX5572

## 2015-06-27 LAB — CBC
HCT: 36 % (ref 36.0–46.0)
Hemoglobin: 11.6 g/dL — ABNORMAL LOW (ref 12.0–15.0)
MCH: 28.6 pg (ref 26.0–34.0)
MCHC: 32.2 g/dL (ref 30.0–36.0)
MCV: 88.9 fL (ref 78.0–100.0)
Platelets: 247 10*3/uL (ref 150–400)
RBC: 4.05 MIL/uL (ref 3.87–5.11)
RDW: 15.4 % (ref 11.5–15.5)
WBC: 9.1 10*3/uL (ref 4.0–10.5)

## 2015-06-27 SURGERY — DILATATION & CURETTAGE/HYSTEROSCOPY WITH RESECTOCOPE
Anesthesia: General | Site: Vagina

## 2015-06-27 MED ORDER — GLYCINE 1.5 % IR SOLN
Status: DC | PRN
  Administered 2015-06-27: 3000 mL

## 2015-06-27 MED ORDER — ONDANSETRON HCL 4 MG/2ML IJ SOLN
INTRAMUSCULAR | Status: DC | PRN
  Administered 2015-06-27: 4 mg via INTRAVENOUS

## 2015-06-27 MED ORDER — IBUPROFEN 800 MG PO TABS: 400.0000 mg | ORAL_TABLET | Freq: Three times a day (TID) | ORAL | Status: DC | PRN

## 2015-06-27 MED ORDER — SCOPOLAMINE 1 MG/3DAYS TD PT72
MEDICATED_PATCH | TRANSDERMAL | Status: AC
  Administered 2015-06-27: 1.5 mg via TRANSDERMAL
  Filled 2015-06-27: qty 1

## 2015-06-27 MED ORDER — LIDOCAINE HCL (CARDIAC) 20 MG/ML IV SOLN
INTRAVENOUS | Status: AC
  Filled 2015-06-27: qty 5

## 2015-06-27 MED ORDER — FENTANYL CITRATE (PF) 100 MCG/2ML IJ SOLN
25.0000 ug | INTRAMUSCULAR | Status: DC | PRN
  Administered 2015-06-27 (×2): 50 ug via INTRAVENOUS

## 2015-06-27 MED ORDER — ACETAMINOPHEN 10 MG/ML IV SOLN
1000.0000 mg | Freq: Once | INTRAVENOUS | Status: AC
  Administered 2015-06-27: 1000 mg via INTRAVENOUS
  Filled 2015-06-27: qty 100

## 2015-06-27 MED ORDER — STERILE WATER FOR IRRIGATION IR SOLN
Status: DC | PRN
  Administered 2015-06-27: 1000 mL

## 2015-06-27 MED ORDER — ONDANSETRON HCL 4 MG/2ML IJ SOLN: 4.0000 mg | Freq: Once | INTRAMUSCULAR | Status: DC | PRN

## 2015-06-27 MED ORDER — FENTANYL CITRATE (PF) 100 MCG/2ML IJ SOLN
INTRAMUSCULAR | Status: AC
  Administered 2015-06-27: 50 ug via INTRAVENOUS
  Filled 2015-06-27: qty 2

## 2015-06-27 MED ORDER — DEXAMETHASONE SODIUM PHOSPHATE 10 MG/ML IJ SOLN
INTRAMUSCULAR | Status: AC
  Filled 2015-06-27: qty 1

## 2015-06-27 MED ORDER — CHLOROPROCAINE HCL 1 % IJ SOLN
INTRAMUSCULAR | Status: AC
  Filled 2015-06-27: qty 30

## 2015-06-27 MED ORDER — FENTANYL CITRATE (PF) 100 MCG/2ML IJ SOLN
INTRAMUSCULAR | Status: AC
  Filled 2015-06-27: qty 2

## 2015-06-27 MED ORDER — ONDANSETRON HCL 4 MG/2ML IJ SOLN
INTRAMUSCULAR | Status: AC
  Filled 2015-06-27: qty 2

## 2015-06-27 MED ORDER — MIDAZOLAM HCL 2 MG/2ML IJ SOLN
INTRAMUSCULAR | Status: AC
  Filled 2015-06-27: qty 2

## 2015-06-27 MED ORDER — FENTANYL CITRATE (PF) 100 MCG/2ML IJ SOLN
100.0000 ug | INTRAMUSCULAR | Status: DC | PRN
  Administered 2015-06-27: 50 ug via INTRAVENOUS

## 2015-06-27 MED ORDER — GLYCOPYRROLATE 0.2 MG/ML IJ SOLN
INTRAMUSCULAR | Status: DC | PRN
  Administered 2015-06-27: 0.2 mg via INTRAVENOUS

## 2015-06-27 MED ORDER — KETOROLAC TROMETHAMINE 30 MG/ML IJ SOLN
INTRAMUSCULAR | Status: DC | PRN
  Administered 2015-06-27: 30 mg via INTRAMUSCULAR
  Administered 2015-06-27: 30 mg via INTRAVENOUS

## 2015-06-27 MED ORDER — PROPOFOL 10 MG/ML IV BOLUS
INTRAVENOUS | Status: DC | PRN
  Administered 2015-06-27: 200 mg via INTRAVENOUS

## 2015-06-27 MED ORDER — PROPOFOL 10 MG/ML IV BOLUS
INTRAVENOUS | Status: AC
  Filled 2015-06-27: qty 20

## 2015-06-27 MED ORDER — GLYCOPYRROLATE 0.2 MG/ML IJ SOLN
INTRAMUSCULAR | Status: AC
  Filled 2015-06-27: qty 1

## 2015-06-27 MED ORDER — KETOROLAC TROMETHAMINE 30 MG/ML IJ SOLN
INTRAMUSCULAR | Status: AC
  Filled 2015-06-27: qty 1

## 2015-06-27 MED ORDER — LACTATED RINGERS IV SOLN
INTRAVENOUS | Status: DC
  Administered 2015-06-27 (×2): via INTRAVENOUS

## 2015-06-27 MED ORDER — LIDOCAINE HCL (CARDIAC) 20 MG/ML IV SOLN
INTRAVENOUS | Status: DC | PRN
  Administered 2015-06-27: 80 mg via INTRAVENOUS

## 2015-06-27 MED ORDER — SCOPOLAMINE 1 MG/3DAYS TD PT72
1.0000 | MEDICATED_PATCH | Freq: Once | TRANSDERMAL | Status: DC
Start: 2015-06-27 — End: 2015-06-27
  Administered 2015-06-27: 1.5 mg via TRANSDERMAL

## 2015-06-27 SURGICAL SUPPLY — 17 items
CANISTER SUCT 3000ML (MISCELLANEOUS) ×2 IMPLANT
CATH ROBINSON RED A/P 16FR (CATHETERS) ×2 IMPLANT
CLOTH BEACON ORANGE TIMEOUT ST (SAFETY) ×2 IMPLANT
CONTAINER PREFILL 10% NBF 60ML (FORM) ×4 IMPLANT
ELECT REM PT RETURN 9FT ADLT (ELECTROSURGICAL) ×2
ELECTRODE REM PT RTRN 9FT ADLT (ELECTROSURGICAL) ×1 IMPLANT
GLOVE BIOGEL PI IND STRL 7.0 (GLOVE) ×2 IMPLANT
GLOVE BIOGEL PI INDICATOR 7.0 (GLOVE) ×2
GLOVE ECLIPSE 6.5 STRL STRAW (GLOVE) ×2 IMPLANT
GOWN STRL REUS W/TWL LRG LVL3 (GOWN DISPOSABLE) ×4 IMPLANT
LOOP ANGLED CUTTING 22FR (CUTTING LOOP) ×2 IMPLANT
PACK VAGINAL MINOR WOMEN LF (CUSTOM PROCEDURE TRAY) ×2 IMPLANT
PAD OB MATERNITY 4.3X12.25 (PERSONAL CARE ITEMS) ×2 IMPLANT
TOWEL OR 17X24 6PK STRL BLUE (TOWEL DISPOSABLE) ×4 IMPLANT
TUBING AQUILEX INFLOW (TUBING) ×2 IMPLANT
TUBING AQUILEX OUTFLOW (TUBING) ×2 IMPLANT
WATER STERILE IRR 1000ML POUR (IV SOLUTION) ×2 IMPLANT

## 2015-06-27 NOTE — Brief Op Note (Signed)
06/27/2015  10:43 AM  PATIENT:  Margaret Porter  45 y.o. female  PRE-OPERATIVE DIAGNOSIS:  Endometrial Mass  POST-OPERATIVE DIAGNOSIS:  Endometrial polyps, ? Arcuate uterus  PROCEDURE:  Diagnostic hysteroscopy, hysteroscopic resection of endometrial polyps, D&C  SURGEON:  Surgeon(s) and Role:    * Servando Salina, MD - Primary  PHYSICIAN ASSISTANT:   ASSISTANTS: none   ANESTHESIA:   general FINDINGS: MULTIPLE UTERINE POLYPS, ? ARCUATE UTERUS NL ENDOCERVICAL CANAL EBL:  Total I/O In: -  Out: 75 [Urine:75]  BLOOD ADMINISTERED:none  DRAINS: none   LOCAL MEDICATIONS USED:  NONE  SPECIMEN:  Source of Specimen:  emc with polyps  DISPOSITION OF SPECIMEN:  PATHOLOGY  COUNTS:  YES  TOURNIQUET:  * No tourniquets in log *  DICTATION: .Other Dictation: Dictation Number [id number]  PLAN OF CARE: Discharge to home after PACU  PATIENT DISPOSITION:  PACU - hemodynamically stable.   Delay start of Pharmacological VTE agent (>24hrs) due to surgical blood loss or risk of bleeding: no

## 2015-06-27 NOTE — Transfer of Care (Signed)
Immediate Anesthesia Transfer of Care Note  Patient: Margaret Porter  Procedure(s) Performed: Procedure(s): DILATATION & CURETTAGE/HYSTEROSCOPY WITH RESECTOCOPE (N/A)  Patient Location: PACU  Anesthesia Type:General  Level of Consciousness: awake, alert , oriented and sedated  Airway & Oxygen Therapy: Patient Spontanous Breathing and Patient connected to nasal cannula oxygen  Post-op Assessment: Report given to RN, Post -op Vital signs reviewed and stable and Patient moving all extremities X 4  Post vital signs: Reviewed and stable  Last Vitals:  Filed Vitals:   06/27/15 0926  BP:   Pulse: 61  Resp: 18    Complications: No apparent anesthesia complications

## 2015-06-27 NOTE — Anesthesia Procedure Notes (Signed)
Procedure Name: LMA Insertion Date/Time: 06/27/2015 9:46 AM Performed by: Mayer Camel, Harleigh Civello A Pre-anesthesia Checklist: Patient identified, Emergency Drugs available, Suction available, Patient being monitored and Timeout performed Patient Re-evaluated:Patient Re-evaluated prior to inductionOxygen Delivery Method: Circle system utilized Preoxygenation: Pre-oxygenation with 100% oxygen Intubation Type: IV induction Ventilation: Mask ventilation without difficulty LMA: LMA inserted and LMA with gastric port inserted LMA Size: 4.0 Number of attempts: 1 Tube secured with: Tape Dental Injury: Teeth and Oropharynx as per pre-operative assessment

## 2015-06-27 NOTE — H&P (Signed)
Margaret Porter is an 45 y.o. female. G1P0010  Margaret Porter presents for surgical management of endometrial mass noted on sonohysterogram done for endometrial thickening on sonogram. Hx notable for (+) HIV since 2003 with low viral count. Pt is followed by Dr Megan Salon  Pertinent Gynecological History: Menses: regular Bleeding: intermenstrual bleeding Contraception: condoms DES exposure: denies Blood transfusions: none Sexually transmitted diseases: HIV Previous GYN Procedures: DNC  Last mammogram: normal Date: 06/2014 Last pap: normal Date: 05/18/2015 OB History: G1, P0010   Menstrual History: Menarche age: n/a LMP 06/02/2015    Past Medical History  Diagnosis Date  . HIV infection    OB: TAB D&E  Family History  Problem Relation Age of Onset  . Diabetes Mother   . Diabetes Brother   . Heart disease Paternal Uncle   . Heart disease Maternal Grandmother   . Cancer Maternal Grandmother   . Heart disease Paternal Grandmother   breast cancer: MGM, PGM Negative fhx: colon/ovarian cancer  Social History:  reports that she has never smoked. She has never used smokeless tobacco. She reports that she drinks alcohol. 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 Amoxicillin Rash  . Aspirin Anxiety  . Sulfamethoxazole-Trimethoprim Rash    Meds: Genvoya one po daily Vit D 50,000o units po weekly  Review of Systems  All other systems reviewed and are negative.   There were no vitals taken for this visit. Physical Exam  Constitutional: She is oriented to person, place, and time. She appears well-developed and well-nourished.  HENT:  Head: Atraumatic.  Eyes: EOM are normal.  Neck: Normal range of motion. Neck supple.  Cardiovascular: Normal rate and regular rhythm.   Respiratory: Breath sounds normal.  GI: Soft.  Genitourinary: Vagina normal and uterus normal.  Musculoskeletal: She exhibits no edema.  Neurological: She  is alert and oriented to person, place, and time.  Skin: Skin is warm and dry.  Psychiatric: She has a normal mood and affect.  cervix nl Uterus AV nl adnexa nonpalp  No results found for this or any previous visit (from the past 24 hour(s)).  Assessment/Plan: Endometrial mass HIV positive P) dx hysteroscopy, resection of endometrial mass, D&C. Procedure explained. Risk of surgery reviewed including infection, bleeding, uterine perforation( 12/998), and its risk, fluid overload, thermal injury  Taino Maertens A 06/27/2015, 5:48 AM

## 2015-06-27 NOTE — Anesthesia Postprocedure Evaluation (Signed)
  Anesthesia Post-op Note  Patient: Margaret Porter  Procedure(s) Performed: Procedure(s) (LRB): DILATATION & CURETTAGE/HYSTEROSCOPY WITH RESECTOCOPE (N/A)  Patient Location: PACU  Anesthesia Type: General  Level of Consciousness: awake and alert   Airway and Oxygen Therapy: Patient Spontanous Breathing  Post-op Pain: mild  Post-op Assessment: Post-op Vital signs reviewed, Patient's Cardiovascular Status Stable, Respiratory Function Stable, Patent Airway and No signs of Nausea or vomiting  Last Vitals:  Filed Vitals:   06/27/15 1145  BP: 131/69  Pulse:   Temp:   Resp:     Post-op Vital Signs: stable   Complications: No apparent anesthesia complications

## 2015-06-28 ENCOUNTER — Encounter (HOSPITAL_COMMUNITY): Payer: Self-pay | Admitting: Obstetrics and Gynecology

## 2015-06-28 NOTE — Op Note (Signed)
NAME:  Margaret Porter, Margaret Porter            ACCOUNT NO.:  [id number]  MEDICAL RECORD NO.:  [id number]  LOCATION:  [facility]                          FACILITY:  [facility]  PHYSICIAN:  Servando Salina, M.D.DATE OF BIRTH:  06/17/1970  DATE OF PROCEDURE:  06/27/2015 DATE OF DISCHARGE:  06/27/2015                              OPERATIVE REPORT   PREOPERATIVE DIAGNOSIS:  Endometrial mass.  PROCEDURE:  Diagnostic hysteroscopy, hysteroscopic resection, endometrial polyps, and dilation and curettage.  POSTOPERATIVE DIAGNOSES:  Endometrial polyps ,question of arcuate uterus.  ANESTHESIA:  General.  SURGEON:  Servando Salina, M.D.  ASSISTANT:  None.  DESCRIPTION OF PROCEDURE:  Under adequate general anesthesia, the patient was placed in dorsal lithotomy position.  She was sterilely prepped and draped in usual fashion.  The bladder was catheterized with moderate amount of urine.  Examination under anesthesia revealed an anteflexed uterus, irregular, no adnexal masses could be appreciated.  A bivalve speculum was placed in vagina.  A single-tooth tenaculum was placed on anterior lip of the cervix.  The cervix was easily dilated up to a #29 Pratt dilator.  A resectoscope with a single loop was inserted into the uterine cavity.  At that point, it was noted that there were multiple polypoid lesions, one of which had infarcted.  After using the resectoscope and resecting those products, the resectoscope was then removed and cavity was curetted for a moderate amount of tissue.  The resectoscope was then inserted.  In the right horn of the uterus, there were polypoid lesions which were also carefully resected.  When all polypoid lesions were resected, then endocervical canal was inspected, no lesions noted.  The resectoscope was removed.  The cavity was then curetted.  All instruments were then subsequently removed from the vagina.  SPECIMEN:  Labeled endometrial curetting with polyps was sent  to Pathology.  ESTIMATED BLOOD LOSS:  15 mL.  FLUID DEFICIT:  360 ml.  COMPLICATION:  None.  The patient tolerated the procedure well and was transferred to recovery in stable condition.     Servando Salina, M.D.     [place]/MEDQ  D:  06/27/2015  T:  06/28/2015  Job:  [id number]

## 2015-07-21 ENCOUNTER — Encounter (INDEPENDENT_AMBULATORY_CARE_PROVIDER_SITE_OTHER): Payer: BC Managed Care – PPO | Admitting: *Deleted

## 2015-07-21 VITALS — BP 126/77 | HR 67 | Temp 98.2°F | Resp 16 | Wt 266.5 lb

## 2015-07-21 DIAGNOSIS — Z006 Encounter for examination for normal comparison and control in clinical research program: Secondary | ICD-10-CM

## 2015-07-21 LAB — COMPREHENSIVE METABOLIC PANEL WITH GFR
ALT: 19 U/L (ref 0–35)
AST: 22 U/L (ref 0–37)
Albumin: 3.5 g/dL (ref 3.5–5.2)
Alkaline Phosphatase: 107 U/L (ref 39–117)
BUN: 9 mg/dL (ref 6–23)
CO2: 21 meq/L (ref 19–32)
Calcium: 8.7 mg/dL (ref 8.4–10.5)
Chloride: 107 meq/L (ref 96–112)
Creat: 0.8 mg/dL (ref 0.50–1.10)
Glucose, Bld: 94 mg/dL (ref 70–99)
Potassium: 4.1 meq/L (ref 3.5–5.3)
Sodium: 140 meq/L (ref 135–145)
Total Bilirubin: 0.3 mg/dL (ref 0.2–1.2)
Total Protein: 6 g/dL (ref 6.0–8.3)

## 2015-07-21 LAB — HIV-1 RNA QUANT-NO REFLEX-BLD

## 2015-07-21 NOTE — Progress Notes (Signed)
Margaret Porter is here for A5321, week 120. We reviewed LOA #3 and letter to participants of data from study. I fully explained this information and answered questions. Participant verbalized understanding and signed the LOA and initialed letter that was witnessed by me. I gave a copy of both to participant. Since her last visit her ARV regimen was switched from [facility] to [facility]. She is having no problems with her current regimen and has been adherent to this. She has also had some endometrial polyps removed which were set for patho and results were benign. BP is with in normal limits. She is continuing to gain weight. I asked her about her eating habits and activities. 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 She has also been going to bed around 1:30am and snacking up until that time. She states she has cut out the snacking late at night and also wants to cut her 1/day soda. I agreed that the choices she is making should help to bring her weight back down as well as some low-intensity walking. She agreed and shows motivation. She denied feeling tired or a change in her appetite. Fastin labs were drawn with no problems. She received $50 gift card for visit. Next appointment scheduled for September 2nd at 9am. This will be for A5322, week 120. Eliezer Champagne RN

## 2015-08-15 ENCOUNTER — Encounter: Payer: Self-pay | Admitting: Internal Medicine

## 2015-09-02 ENCOUNTER — Encounter (INDEPENDENT_AMBULATORY_CARE_PROVIDER_SITE_OTHER): Payer: BC Managed Care – PPO | Admitting: *Deleted

## 2015-09-02 VITALS — BP 124/82 | HR 69 | Temp 98.2°F | Resp 16 | Wt 267.5 lb

## 2015-09-02 DIAGNOSIS — Z006 Encounter for examination for normal comparison and control in clinical research program: Secondary | ICD-10-CM

## 2015-09-02 NOTE — Progress Notes (Signed)
Margaret Porter is here for her week 120 visit for The HAILO Study: A Long Term follow-up of Older HIV-Infected Adults in the ACTG, an observational study addressing the issues of aging, HIV infection and Inflammation. She just had a viral load drawn in July so we are not repeating it at this visit. She is due back for the other study in October where we will get clinic labs and then she is to see Dr. Megan Salon a few weeks later. She denies any new problems or concerns. She had endometrial polyps removed in June.

## 2015-11-08 ENCOUNTER — Encounter (INDEPENDENT_AMBULATORY_CARE_PROVIDER_SITE_OTHER): Payer: BC Managed Care – PPO | Admitting: *Deleted

## 2015-11-08 VITALS — BP 129/85 | HR 65 | Temp 98.3°F | Resp 16 | Wt 261.5 lb

## 2015-11-08 DIAGNOSIS — Z006 Encounter for examination for normal comparison and control in clinical research program: Secondary | ICD-10-CM

## 2015-11-08 DIAGNOSIS — Z299 Encounter for prophylactic measures, unspecified: Secondary | ICD-10-CM

## 2015-11-08 LAB — COMPREHENSIVE METABOLIC PANEL
ALBUMIN: 4 g/dL (ref 3.6–5.1)
ALK PHOS: 129 U/L — AB (ref 33–115)
ALT: 15 U/L (ref 6–29)
AST: 17 U/L (ref 10–35)
BILIRUBIN TOTAL: 0.4 mg/dL (ref 0.2–1.2)
BUN: 13 mg/dL (ref 7–25)
CALCIUM: 9.3 mg/dL (ref 8.6–10.2)
CO2: 24 mmol/L (ref 20–31)
Chloride: 105 mmol/L (ref 98–110)
Creat: 0.81 mg/dL (ref 0.50–1.10)
Glucose, Bld: 94 mg/dL (ref 65–99)
Potassium: 4.5 mmol/L (ref 3.5–5.3)
Sodium: 137 mmol/L (ref 135–146)
Total Protein: 6.3 g/dL (ref 6.1–8.1)

## 2015-11-08 LAB — CD4/CD8 (T-HELPER/T-SUPPRESSOR CELL)
CD4%: 34.4
CD4: 550
CD8 T CELL SUPPRESSOR: 44.3
CD8: 709

## 2015-11-08 LAB — HIV-1 RNA QUANT-NO REFLEX-BLD

## 2015-11-08 NOTE — Progress Notes (Signed)
Margaret Porter is here for A5321: Decay of HIV-1 Reservoirs in Patients on Long-Term Antiretroviral Therapy: the ACTG HIV Reserviors Cohort [facility]) study wk 144 visit. No new complaints verbalized. Has an appointment for follow up with Dr Megan Salon scheduled for 11/23/15 at 1:45pm. [name], [name]

## 2015-11-09 LAB — RPR

## 2015-11-09 LAB — HEPATITIS C ANTIBODY: HCV AB: NEGATIVE

## 2015-11-22 ENCOUNTER — Encounter: Payer: Self-pay | Admitting: Internal Medicine

## 2015-11-22 ENCOUNTER — Ambulatory Visit: Payer: BC Managed Care – PPO | Admitting: Internal Medicine

## 2015-11-23 ENCOUNTER — Encounter: Payer: Self-pay | Admitting: Internal Medicine

## 2015-11-23 ENCOUNTER — Ambulatory Visit (INDEPENDENT_AMBULATORY_CARE_PROVIDER_SITE_OTHER): Payer: BC Managed Care – PPO | Admitting: Internal Medicine

## 2015-11-23 VITALS — BP 124/84 | HR 73 | Temp 98.0°F | Wt 263.0 lb

## 2015-11-23 DIAGNOSIS — E669 Obesity, unspecified: Secondary | ICD-10-CM | POA: Diagnosis not present

## 2015-11-23 DIAGNOSIS — Z23 Encounter for immunization: Secondary | ICD-10-CM

## 2015-11-23 DIAGNOSIS — M25562 Pain in left knee: Secondary | ICD-10-CM

## 2015-11-23 DIAGNOSIS — B20 Human immunodeficiency virus [HIV] disease: Secondary | ICD-10-CM

## 2015-11-23 NOTE — Assessment & Plan Note (Signed)
I suspect that she may be developing osteoarthritis. I instructed her to try acetaminophen as needed. If it continues to bother her instructed her to follow-up with her primary care provider.

## 2015-11-23 NOTE — Progress Notes (Signed)
Patient Active Problem List   Diagnosis Date Noted  . Human immunodeficiency virus (HIV) disease ([place]) 01/24/2007    Priority: High  . Left knee pain 11/23/2015    Priority: Medium  . Abnormal dreams 01/06/2014    Priority: Medium  . HIP PAIN, LEFT 05/04/2008    Priority: Medium  . OBESITY 11/06/2007    Priority: Medium  . Rosacea 04/22/2012  . URINARY INCONTINENCE, URGE 10/24/2010  . HYPERLIPIDEMIA 11/06/2007  . URTICARIA, IDIOPATHIC 03/06/2007    Patient's Medications  New Prescriptions   No medications on file  Previous Medications   CETIRIZINE (ZYRTEC) 10 MG TABLET    Take 10 mg by mouth daily as needed for allergies (For itching.).    DIPHENHYDRAMINE (BENADRYL) 25 MG TABLET    Take 25 mg by mouth every 6 (six) hours as needed for itching or allergies.   ELVITEGRAVIR-COBICISTAT-EMTRICITABINE-TENOFOVIR (GENVOYA) 150-150-200-10 MG TABS TABLET    Take 1 tablet by mouth daily with breakfast.   ERGOCALCIFEROL (VITAMIN D2) 50000 UNITS CAPSULE    Take 50,000 Units by mouth once a week.   IBUPROFEN (ADVIL,MOTRIN) 800 MG TABLET    Take 0.5-1 tablets (400-800 mg total) by mouth every 8 (eight) hours as needed for mild pain, moderate pain or cramping.  Modified Medications   No medications on file  Discontinued Medications   No medications on file    Subjective: Margaret Porter is in for her routine HIV follow-up visit. She has not missed any doses of her Genvoya since her last visit but often forgets to take it during the day with meals and then takes it before bedtime on an empty stomach. She has no side effects. Her only current complaint is increasing left knee pain. This has been gradual in onset. She does not recall any injury. It bothers her when she is standing or walking and sometimes when she rolls over in bed at night.   Review of Systems: Review of Systems  Constitutional: Negative for fever, chills, weight loss, malaise/fatigue and diaphoresis.  HENT: Negative  for sore throat.   Respiratory: Negative for cough, sputum production and shortness of breath.   Cardiovascular: Negative for chest pain.  Gastrointestinal: Negative for nausea, vomiting and diarrhea.  Genitourinary: Negative for dysuria and frequency.  Musculoskeletal: Positive for joint pain. Negative for myalgias.  Skin: Negative for rash.  Neurological: Negative for focal weakness.  Psychiatric/Behavioral: Negative for depression and substance abuse. The patient is not nervous/anxious.     Past Medical History  Diagnosis Date  . HIV infection [facility])     Social History  Substance Use Topics  . Smoking status: Never Smoker   . Smokeless tobacco: Never Used  . Alcohol Use: 0.0 oz/week    0 Standard drinks or equivalent per week     Comment: occassional (special occasions)    Family History  Problem Relation Age of Onset  . Diabetes Mother   . Diabetes Brother   . Heart disease Paternal Uncle   . Heart disease Maternal Grandmother   . Cancer Maternal Grandmother   . Heart disease Paternal Grandmother     Allergies  Allergen Reactions  . Amoxicillin Rash  . Aspirin Anxiety  . Sulfamethoxazole-Trimethoprim Rash    Objective:  Filed Vitals:   11/23/15 1344  BP: 124/84  Pulse: 73  Temp: 98 F (36.7 C)  TempSrc: Oral  Weight: 263 lb (119.296 kg)   Body mass index is 46.6 kg/(m^2).  Physical Exam  Constitutional: She is oriented to person, place, and time. No distress.  Eyes: Conjunctivae are normal.  Cardiovascular: Normal rate and regular rhythm.   No murmur heard. Pulmonary/Chest: Breath sounds normal.  Abdominal: Soft. She exhibits no mass. There is no tenderness.  Musculoskeletal: Normal range of motion.  There is no swelling, redness or warmth in her left knee. However there is audible crepitus with full extension.  Neurological: She is alert and oriented to person, place, and time.  Skin: No rash noted.  Psychiatric: Mood and affect normal.    Lab  Results Lab Results  Component Value Date   WBC 9.1 06/27/2015   HGB 11.6* 06/27/2015   HCT 36.0 06/27/2015   MCV 88.9 06/27/2015   PLT 247 06/27/2015    Lab Results  Component Value Date   CREATININE 0.81 11/08/2015   BUN 13 11/08/2015   NA 137 11/08/2015   K 4.5 11/08/2015   CL 105 11/08/2015   CO2 24 11/08/2015    Lab Results  Component Value Date   ALT 15 11/08/2015   AST 17 11/08/2015   ALKPHOS 129* 11/08/2015   BILITOT 0.4 11/08/2015    Lab Results  Component Value Date   CHOL 142 03/28/2015   HDL 39* 03/28/2015   LDLCALC 80 03/28/2015   TRIG 113 03/28/2015   CHOLHDL 3.6 03/28/2015    Lab Results HIV 1 RNA QUANT (copies/mL)  Date Value  07/24/2010 39  03/01/2010 39  08/04/2009 39   HIV-1 RNA VIRAL LOAD (no units)  Date Value  07/21/2015 <40  02/08/2015 <40  07/13/2014 <40   CD4 (no units)  Date Value  02/08/2015 443  12/21/2013 475  07/31/2013 531   CD4 T CELL ABS  Date Value  04/12/2009 390 cmm*  08/14/2004 19      Problem List Items Addressed This Visit      High   Human immunodeficiency virus (HIV) disease ([place])    Her HIV infection remains under excellent control. I informed her again that Genvoya needs to be taken with a meal. She states that she will start taking it to work and have it with her lunch. I asked her to call me if she continues to forget. I will check an HLA B5701 today. If that is negative he may be worth switching her over to Triumeq which she can take any time of day on an empty stomach. She will follow-up after lab work in 6 months.      Relevant Orders   HLA B*5701   T-helper cell (CD4)- ([facility] clinic only)   HIV 1 RNA quant-no reflex-bld   CBC   Comprehensive metabolic panel   Lipid panel   RPR     Medium   Left knee pain - Primary    I suspect that she may be developing osteoarthritis. I instructed her to try acetaminophen as needed. If it continues to bother her instructed her to follow-up with her primary  care provider.      OBESITY    She has morbid obesity and continues to gain weight. This is probably contributing to her left knee pain but conversely her knee pain makes it more difficult for her to exercise and lose weight. I talked to her again about sustainable lifestyle modification.           Michel Bickers, MD [facility] [phone] pager   [phone] cell 11/23/2015, 2:15 PM

## 2015-11-23 NOTE — Assessment & Plan Note (Signed)
She has morbid obesity and continues to gain weight. This is probably contributing to her left knee pain but conversely her knee pain makes it more difficult for her to exercise and lose weight. I talked to her again about sustainable lifestyle modification.

## 2015-11-23 NOTE — Assessment & Plan Note (Signed)
Her HIV infection remains under excellent control. I informed her again that Genvoya needs to be taken with a meal. She states that she will start taking it to work and have it with her lunch. I asked her to call me if she continues to forget. I will check an HLA B5701 today. If that is negative he may be worth switching her over to Triumeq which she can take any time of day on an empty stomach. She will follow-up after lab work in 6 months.

## 2015-11-27 ENCOUNTER — Telehealth: Payer: Self-pay | Admitting: Medical

## 2015-11-27 NOTE — Telephone Encounter (Signed)
pls have them come in for routine physical

## 2015-11-30 ENCOUNTER — Encounter: Payer: Self-pay | Admitting: Infectious Disease

## 2015-11-30 LAB — HLA B*5701: HLA-B*5701 w/rflx HLA-B High: NEGATIVE

## 2015-12-09 ENCOUNTER — Telehealth: Payer: Self-pay | Admitting: Medical

## 2015-12-09 NOTE — Telephone Encounter (Signed)
Left message for pt to call. Needs a CPE appt per shane.

## 2015-12-09 NOTE — Telephone Encounter (Signed)
Left message for pt to call.

## 2015-12-20 ENCOUNTER — Encounter: Payer: Self-pay | Admitting: Internal Medicine

## 2015-12-22 ENCOUNTER — Ambulatory Visit (INDEPENDENT_AMBULATORY_CARE_PROVIDER_SITE_OTHER): Payer: BC Managed Care – PPO | Admitting: Medical

## 2015-12-22 ENCOUNTER — Encounter: Payer: Self-pay | Admitting: Medical

## 2015-12-22 VITALS — BP 140/100 | HR 91 | Ht 64.0 in | Wt 263.0 lb

## 2015-12-22 DIAGNOSIS — F518 Other sleep disorders not due to a substance or known physiological condition: Secondary | ICD-10-CM

## 2015-12-22 DIAGNOSIS — Z0000 Encounter for general adult medical examination without abnormal findings: Secondary | ICD-10-CM

## 2015-12-22 DIAGNOSIS — L501 Idiopathic urticaria: Secondary | ICD-10-CM | POA: Diagnosis not present

## 2015-12-22 DIAGNOSIS — Z23 Encounter for immunization: Secondary | ICD-10-CM | POA: Diagnosis not present

## 2015-12-22 DIAGNOSIS — E785 Hyperlipidemia, unspecified: Secondary | ICD-10-CM | POA: Diagnosis not present

## 2015-12-22 DIAGNOSIS — R748 Abnormal levels of other serum enzymes: Secondary | ICD-10-CM

## 2015-12-22 DIAGNOSIS — E669 Obesity, unspecified: Secondary | ICD-10-CM

## 2015-12-22 DIAGNOSIS — E559 Vitamin D deficiency, unspecified: Secondary | ICD-10-CM | POA: Diagnosis not present

## 2015-12-22 DIAGNOSIS — B20 Human immunodeficiency virus [HIV] disease: Secondary | ICD-10-CM

## 2015-12-22 DIAGNOSIS — M25562 Pain in left knee: Secondary | ICD-10-CM

## 2015-12-22 DIAGNOSIS — N3941 Urge incontinence: Secondary | ICD-10-CM | POA: Diagnosis not present

## 2015-12-22 DIAGNOSIS — L719 Rosacea, unspecified: Secondary | ICD-10-CM

## 2015-12-22 DIAGNOSIS — R030 Elevated blood-pressure reading, without diagnosis of hypertension: Secondary | ICD-10-CM

## 2015-12-22 LAB — CBC
HEMATOCRIT: 39.7 % (ref 36.0–46.0)
HEMOGLOBIN: 12.7 g/dL (ref 12.0–15.0)
MCH: 28.5 pg (ref 26.0–34.0)
MCHC: 32 g/dL (ref 30.0–36.0)
MCV: 89.2 fL (ref 78.0–100.0)
MPV: 10.5 fL (ref 8.6–12.4)
Platelets: 288 10*3/uL (ref 150–400)
RBC: 4.45 MIL/uL (ref 3.87–5.11)
RDW: 15.7 % — ABNORMAL HIGH (ref 11.5–15.5)
WBC: 6.3 10*3/uL (ref 4.0–10.5)

## 2015-12-22 LAB — HEPATIC FUNCTION PANEL
ALT: 17 U/L (ref 6–29)
AST: 21 U/L (ref 10–35)
Albumin: 4 g/dL (ref 3.6–5.1)
Alkaline Phosphatase: 110 U/L (ref 33–115)
BILIRUBIN INDIRECT: 0.3 mg/dL (ref 0.2–1.2)
Bilirubin, Direct: 0.1 mg/dL (ref <=0.2)
Total Bilirubin: 0.4 mg/dL (ref 0.2–1.2)
Total Protein: 6.3 g/dL (ref 6.1–8.1)

## 2015-12-22 LAB — LIPID PANEL
Cholesterol: 161 mg/dL (ref 125–200)
HDL: 37 mg/dL — ABNORMAL LOW (ref >=46)
LDL Cholesterol: 94 mg/dL (ref <130)
Total CHOL/HDL Ratio: 4.4 Ratio (ref <=5.0)
Triglycerides: 151 mg/dL — ABNORMAL HIGH (ref <150)
VLDL: 30 mg/dL (ref <30)

## 2015-12-22 NOTE — Progress Notes (Signed)
Subjective:   HPI  Margaret Porter is a 45 y.o. female who presents for a complete physical.  Chief Complaint  Patient presents with  . Annual Exam    fasting. vision done. will give urine at end. dr Bridget Hartshorn, hiv. dr cousins, gyn. has been having lt knee problems.   Medical care team includes:  Dr. Michel Bickers with infectious disease  Dr. Garwin Brothers, gyn [place], [facility], PA-C here for primary care   Concerns: Intermittent knee pain,  Lately has not been giving her trouble  Reviewed their medical, surgical, family, social, medication, and allergy history and updated chart as appropriate.  Past Medical History  Diagnosis Date  . Allergic rhinitis   . HIV infection ([place]) 2006  . Rosacea   . Idiopathic urticaria     Past Surgical History  Procedure Laterality Date  . Dilatation & currettage/hysteroscopy with resectocope N/A 06/27/2015    Procedure: DILATATION & CURETTAGE/HYSTEROSCOPY WITH RESECTOCOPE;  Surgeon: Servando Salina, MD;  Location: [facility];  Service: Gynecology;  Laterality: N/A;    Social History   Social History  . Marital Status: Single    Spouse Name: N/A  . Number of Children: N/A  . Years of Education: N/A   Occupational History  . Not on file.   Social History Main Topics  . Smoking status: Never Smoker   . Smokeless tobacco: Never Used  . Alcohol Use: 0.0 oz/week    0 Standard drinks or equivalent per week     Comment: occassional (special occasions)  . Drug Use: No  . Sexual Activity: Yes     Comment: given condoms   Other Topics Concern  . 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   Brother is temporarily living with her.  Single, no children    Family History  Problem Relation Age of Onset  . Diabetes Mother   . Diabetes Brother   . Heart disease Paternal Uncle   . Heart disease Maternal Grandmother   . Cancer Maternal Grandmother   . Heart disease Paternal  Grandmother      Current outpatient prescriptions:  .  elvitegravir-cobicistat-emtricitabine-tenofovir (GENVOYA) 150-150-200-10 MG TABS tablet, Take 1 tablet by mouth daily with breakfast., Disp: 30 tablet, Rfl: 11 .  cetirizine (ZYRTEC) 10 MG tablet, Take 10 mg by mouth daily as needed for allergies (For itching.). Reported on 12/22/2015, Disp: , Rfl:  .  diphenhydrAMINE (BENADRYL) 25 MG tablet, Take 25 mg by mouth every 6 (six) hours as needed for itching or allergies. Reported on 12/22/2015, Disp: , Rfl:  .  ergocalciferol (VITAMIN D2) 50000 UNITS capsule, Take 50,000 Units by mouth once a week. Reported on 12/22/2015, Disp: , Rfl:  .  ibuprofen (ADVIL,MOTRIN) 800 MG tablet, Take 0.5-1 tablets (400-800 mg total) by mouth every 8 (eight) hours as needed for mild pain, moderate pain or cramping. (Patient not taking: Reported on 12/22/2015), Disp: 30 tablet, Rfl: 0  Allergies  Allergen Reactions  . Amoxicillin Rash  . Aspirin Anxiety  . Sulfamethoxazole-Trimethoprim Rash    Review of Systems Constitutional: -fever, -chills, -sweats, -unexpected weight change, -decreased appetite, -fatigue Allergy: -sneezing, -itching, -congestion Dermatology: -changing moles, --rash, -lumps ENT: -runny nose, -ear pain, -sore throat, -hoarseness, -sinus pain, -teeth pain, - ringing in ears, -hearing loss, -nosebleeds Cardiology: -chest pain, -palpitations, -swelling, -difficulty breathing when lying flat, -waking up short of breath Respiratory: -cough, -shortness of breath, -difficulty breathing with exercise or exertion, -wheezing, -coughing  up blood Gastroenterology: -abdominal pain, -nausea, -vomiting, -diarrhea, -constipation, -blood in stool, -changes in bowel movement, -difficulty swallowing or eating Hematology: -bleeding, -bruising  Musculoskeletal: -joint aches, -muscle aches, -joint swelling, -back pain, -neck pain, -cramping, -changes in gait Ophthalmology: denies vision changes, eye redness,  itching, discharge Urology: -burning with urination, -difficulty urinating, -blood in urine, -urinary frequency, -urgency, -incontinence Neurology: -headache, -weakness, -tingling, -numbness, -memory loss, -falls, -dizziness Psychology: -depressed mood, -agitation, -sleep problems     Objective:   Physical Exam  BP 140/100 mmHg  Pulse 91  Ht 5\' 4"  (1.626 m)  Wt 263 lb (119.296 kg)  BMI 45.12 kg/m2  LMP 11/28/2015  General appearance: alert, no distress, WD/WN, AA female Skin: upper mid back with 37mm raised dense papular lesion, few other scattered macules,no other worrisome lesions HEENT: normocephalic, conjunctiva/corneas normal, sclerae anicteric, PERRLA, EOMi, nares patent, no discharge or erythema, pharynx normal Oral cavity: MMM, tongue normal, teeth - missing several upper molars.  Otherwise in good repair Neck: supple, no lymphadenopathy, no thyromegaly, no masses, normal ROM, on bruits Chest: non tender, normal shape and expansion Heart: RRR, normal S1, S2, no murmurs Lungs: CTA bilaterally, no wheezes, rhonchi, or rales Abdomen: +bs, soft, non tender, non distended, no masses, no hepatomegaly, no splenomegaly, no bruits Back: non tender, normal ROM, no scoliosis Musculoskeletal: upper extremities non tender, no obvious deformity, normal ROM throughout, lower extremities non tender, no obvious deformity, normal ROM throughout Extremities: no edema, no cyanosis, no clubbing Pulses: 2+ symmetric, upper and lower extremities, normal cap refill Neurological: alert, oriented x 3, CN2-12 intact, strength normal upper extremities and lower extremities, sensation normal throughout, DTRs 2+ throughout, no cerebellar signs, gait normal Psychiatric: normal affect, behavior normal, pleasant  Breast/gyn/rectal - deferred to gyn   Assessment and Plan :    Encounter Diagnoses  Name Primary?  . Encounter for health maintenance examination in adult Yes  . Human immunodeficiency virus  (HIV) disease ([place])   . OBESITY   . URTICARIA, IDIOPATHIC   . Rosacea   . Abnormal dreams   . Left knee pain   . URINARY INCONTINENCE, URGE   . Hyperlipidemia   . Vitamin D deficiency   . Need for Tdap vaccination   . Alkaline phosphatase elevation   . Elevated blood pressure reading without diagnosis of hypertension     Physical exam - discussed healthy lifestyle, diet, exercise, preventative care, vaccinations, and addressed their concerns.  Handout given. See your eye doctor yearly for routine vision care. See your dentist yearly for routine dental care including hygiene visits twice yearly. See your gynecologist yearly for routine gynecological care. Can return for biopsy of upper back skin lesion Get yearly flu shot If knee c/t to bother her, lets get xray.  She will let me know Eat healthy diet, exercise regularly, work on losing weight C/t routine f/u with infectious disease Advised the need for compliance with vit D. Elevated BP - c/t to monitor BP.   She has a home cuff.  Most of her recent chart records show normal BP. Counseled on the Tdap (tetanus, diptheria, and acellular pertussis) vaccine.  Vaccine information sheet given. Tdap vaccine given after consent obtained. Follow-up pending labs.

## 2015-12-23 ENCOUNTER — Other Ambulatory Visit: Payer: Self-pay | Admitting: Medical

## 2015-12-23 LAB — HEMOGLOBIN A1C
Hgb A1c MFr Bld: 5.6 % (ref <5.7)
MEAN PLASMA GLUCOSE: 114 mg/dL (ref <117)

## 2015-12-23 LAB — VITAMIN D 25 HYDROXY (VIT D DEFICIENCY, FRACTURES): VIT D 25 HYDROXY: 12 ng/mL — AB (ref 30–100)

## 2015-12-23 MED ORDER — VITAMIN D3 50 MCG (2000 UT) PO CAPS: 2000.0000 [IU] | ORAL_CAPSULE | Freq: Every day | ORAL | Status: DC

## 2016-01-03 ENCOUNTER — Other Ambulatory Visit: Payer: Self-pay | Admitting: *Deleted

## 2016-01-03 DIAGNOSIS — B20 Human immunodeficiency virus [HIV] disease: Secondary | ICD-10-CM

## 2016-01-03 MED ORDER — ELVITEG-COBIC-EMTRICIT-TENOFAF 150-150-200-10 MG PO TABS: 1.0000 | ORAL_TABLET | Freq: Every day | ORAL | Status: DC

## 2016-02-13 ENCOUNTER — Encounter (INDEPENDENT_AMBULATORY_CARE_PROVIDER_SITE_OTHER): Payer: BC Managed Care – PPO | Admitting: *Deleted

## 2016-02-13 VITALS — BP 124/84 | HR 76 | Temp 98.1°F | Resp 16 | Ht 63.0 in | Wt 265.2 lb

## 2016-02-13 DIAGNOSIS — Z006 Encounter for examination for normal comparison and control in clinical research program: Secondary | ICD-10-CM

## 2016-02-13 LAB — LIPID PANEL
Cholesterol: 181 mg/dL (ref 125–200)
HDL: 36 mg/dL — ABNORMAL LOW (ref >=46)
LDL Cholesterol: 110 mg/dL (ref <130)
Total CHOL/HDL Ratio: 5 Ratio (ref <=5.0)
Triglycerides: 176 mg/dL — ABNORMAL HIGH (ref <150)
VLDL: 35 mg/dL — ABNORMAL HIGH (ref <30)

## 2016-02-13 LAB — COMPREHENSIVE METABOLIC PANEL
ALT: 17 U/L (ref 6–29)
AST: 19 U/L (ref 10–35)
Albumin: 3.9 g/dL (ref 3.6–5.1)
Alkaline Phosphatase: 117 U/L — ABNORMAL HIGH (ref 33–115)
BUN: 10 mg/dL (ref 7–25)
CO2: 29 mmol/L (ref 20–31)
Calcium: 9 mg/dL (ref 8.6–10.2)
Chloride: 103 mmol/L (ref 98–110)
Creat: 0.92 mg/dL (ref 0.50–1.10)
GLUCOSE: 98 mg/dL (ref 65–99)
Potassium: 4.5 mmol/L (ref 3.5–5.3)
Sodium: 139 mmol/L (ref 135–146)
Total Bilirubin: 0.3 mg/dL (ref 0.2–1.2)
Total Protein: 6.3 g/dL (ref 6.1–8.1)

## 2016-02-13 LAB — CBC WITH DIFFERENTIAL/PLATELET
Basophils Absolute: 0 10*3/uL (ref 0.0–0.1)
Basophils Relative: 0 % (ref 0–1)
EOS ABS: 0.1 10*3/uL (ref 0.0–0.7)
EOS PCT: 1 % (ref 0–5)
HEMATOCRIT: 41.4 % (ref 36.0–46.0)
Hemoglobin: 13.4 g/dL (ref 12.0–15.0)
LYMPHS ABS: 1.4 10*3/uL (ref 0.7–4.0)
LYMPHS PCT: 23 % (ref 12–46)
MCH: 29.6 pg (ref 26.0–34.0)
MCHC: 32.4 g/dL (ref 30.0–36.0)
MCV: 91.6 fL (ref 78.0–100.0)
MPV: 10.3 fL (ref 8.6–12.4)
Monocytes Absolute: 0.5 10*3/uL (ref 0.1–1.0)
Monocytes Relative: 8 % (ref 3–12)
NEUTROS PCT: 68 % (ref 43–77)
Neutro Abs: 4.2 10*3/uL (ref 1.7–7.7)
Platelets: 273 10*3/uL (ref 150–400)
RBC: 4.52 MIL/uL (ref 3.87–5.11)
RDW: 15.4 % (ref 11.5–15.5)
WBC: 6.2 10*3/uL (ref 4.0–10.5)

## 2016-02-13 LAB — HEMOGLOBIN A1C
Hgb A1c MFr Bld: 5.7 % — ABNORMAL HIGH (ref <5.7)
Mean Plasma Glucose: 117 mg/dL — ABNORMAL HIGH (ref <117)

## 2016-02-13 LAB — CREATININE, URINE, RANDOM: CREATININE, URINE: 265 mg/dL (ref 20–320)

## 2016-02-13 LAB — PROTEIN, URINE, RANDOM: Total Protein, Urine: 24 mg/dL (ref 5–24)

## 2016-02-13 NOTE — Progress Notes (Signed)
Margaret Porter is here today for a study visit for The HAILO Study: A Long Term follow-up of Older HIV-Infected Adults in the ACTG, an observational study addressing the issues of aging, HIV infection and Inflammation. She said she is now [place] Vitamin D everyday 2000 units per her PCP recommendation for low levels. She continues to have foot pain, RT > LT.[name] Kitchen She has also noticed a stiff elbow recently on the right side. She doesn't remember injuring it.  She also got vaccinations for TDAP back in December. She had a PAP last year with Dr. Garwin Brothers but the report is not on file. She had a curretage after she saw her for heavy bleeding in July. She is to return for A5321 in June.

## 2016-02-29 ENCOUNTER — Ambulatory Visit (INDEPENDENT_AMBULATORY_CARE_PROVIDER_SITE_OTHER): Payer: BC Managed Care – PPO | Admitting: Medical

## 2016-02-29 ENCOUNTER — Encounter: Payer: Self-pay | Admitting: Medical

## 2016-02-29 VITALS — BP 120/80 | HR 88 | Temp 98.6°F | Wt 266.0 lb

## 2016-02-29 DIAGNOSIS — J029 Acute pharyngitis, unspecified: Secondary | ICD-10-CM

## 2016-02-29 DIAGNOSIS — J3489 Other specified disorders of nose and nasal sinuses: Secondary | ICD-10-CM

## 2016-02-29 MED ORDER — MUPIROCIN 2 % EX OINT: 1.0000 "application " | TOPICAL_OINTMENT | Freq: Three times a day (TID) | CUTANEOUS | Status: DC

## 2016-02-29 NOTE — Progress Notes (Signed)
  Subjective: Margaret Porter is a 46 y.o. female who presents for evaluation of sore throat.  Has had few day hx/o sore throat, some runny nose, slight cough, some nausea, but no fever, no SOB, no vomiting, no diarrhea, no sinus pressure, no productive cough, no ear pain.   Has had several sick contacts at school.  Also feels sore in left nares.  No other aggravating or relieving factors. No other complaint.  The following portions of the patient's history were reviewed and updated as appropriate: allergies, current medications, past medical history, past social history, past surgical history and problem list.  Past Medical History  Diagnosis Date  . Allergic rhinitis   . HIV infection ([place]) 2006  . Rosacea   . Idiopathic urticaria     ROS as in subjective    Objective: BP 120/80 mmHg  Pulse 88  Temp(Src) 98.6 F (37 C) (Tympanic)  Wt 266 lb (120.657 kg)  LMP 02/28/2016  General appearance: no distress, WD/WN HEENT: normocephalic, conjunctiva/corneas normal, sclerae anicteric, nares with erythema, no obvious lesion or bleeding, no discharge or erythema, pharynx with mild erythema, Oral cavity: MMM, no lesions  Neck: supple, no lymphadenopathy, no thyromegaly Lungs: CTA bilaterally, no wheezes, rhonchi, or rales   Laboratory Strep test done. Results:negative.     Assessment: Encounter Diagnoses  Name Primary?  . Sore throat Yes  . Sore in nose   . Viral pharyngitis      Plan: Discussed symptoms, exam, and etiology appears to be viral pharyngis.   discussed supportive care, can use Mupirocin ointment in left nare TID, discussed time frame for symptoms to resolve.  If worse or not improving, call or return.

## 2016-03-07 LAB — POCT RAPID STREP A (OFFICE): RAPID STREP A SCREEN: NEGATIVE

## 2016-03-07 NOTE — Addendum Note (Signed)
Addended by: Billie Lade on: 03/07/2016 09:26 AM   Modules accepted: Orders, SmartSet

## 2016-04-09 ENCOUNTER — Encounter (INDEPENDENT_AMBULATORY_CARE_PROVIDER_SITE_OTHER): Payer: BC Managed Care – PPO | Admitting: *Deleted

## 2016-04-09 VITALS — BP 122/84 | HR 70 | Temp 98.1°F | Resp 16 | Wt 265.5 lb

## 2016-04-09 DIAGNOSIS — Z006 Encounter for examination for normal comparison and control in clinical research program: Secondary | ICD-10-CM

## 2016-04-09 LAB — COMPREHENSIVE METABOLIC PANEL
ALBUMIN: 4.1 g/dL (ref 3.6–5.1)
ALT: 17 U/L (ref 6–29)
AST: 20 U/L (ref 10–35)
Alkaline Phosphatase: 117 U/L — ABNORMAL HIGH (ref 33–115)
BILIRUBIN TOTAL: 0.3 mg/dL (ref 0.2–1.2)
BUN: 10 mg/dL (ref 7–25)
CALCIUM: 9.1 mg/dL (ref 8.6–10.2)
CO2: 24 mmol/L (ref 20–31)
Chloride: 107 mmol/L (ref 98–110)
Creat: 0.82 mg/dL (ref 0.50–1.10)
Glucose, Bld: 113 mg/dL — ABNORMAL HIGH (ref 65–99)
POTASSIUM: 4.2 mmol/L (ref 3.5–5.3)
Sodium: 140 mmol/L (ref 135–146)
Total Protein: 6.3 g/dL (ref 6.1–8.1)

## 2016-04-09 LAB — HIV-1 RNA QUANT-NO REFLEX-BLD

## 2016-04-09 NOTE — Progress Notes (Signed)
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 She denies any new problems or concerns. She will return in August for the next study visit.

## 2016-05-04 ENCOUNTER — Encounter: Payer: Self-pay | Admitting: Internal Medicine

## 2016-06-19 ENCOUNTER — Encounter (INDEPENDENT_AMBULATORY_CARE_PROVIDER_SITE_OTHER): Payer: Self-pay | Admitting: *Deleted

## 2016-06-19 VITALS — BP 118/84 | HR 74 | Temp 98.6°F | Resp 16 | Wt 261.5 lb

## 2016-06-19 DIAGNOSIS — Z006 Encounter for examination for normal comparison and control in clinical research program: Secondary | ICD-10-CM

## 2016-06-19 NOTE — Progress Notes (Signed)
Margaret Porter is here for her week 66 visit for The HAILO Study: A Long Term follow-up of Older HIV-Infected Adults in the ACTG, an observational study addressing the issues of aging, HIV infection and Inflammation.. She denies any current problems or concerns. She is going to come back Oct. 30th for the visit for A5321 and then see Dr. Megan Salon late in November.

## 2016-10-29 ENCOUNTER — Encounter (INDEPENDENT_AMBULATORY_CARE_PROVIDER_SITE_OTHER): Payer: BC Managed Care – PPO | Admitting: *Deleted

## 2016-10-29 VITALS — BP 133/85 | HR 64 | Temp 98.0°F | Wt 266.5 lb

## 2016-10-29 DIAGNOSIS — Z006 Encounter for examination for normal comparison and control in clinical research program: Secondary | ICD-10-CM

## 2016-10-29 DIAGNOSIS — Z23 Encounter for immunization: Secondary | ICD-10-CM | POA: Diagnosis not present

## 2016-10-29 LAB — CD4/CD8 (T-HELPER/T-SUPPRESSOR CELL)
CD4 absolute: 528
CD4%: 37.7
CD8 % Suppressor T Cell: 588
CD8 T Cell Abs: 42

## 2016-10-29 LAB — COMPREHENSIVE METABOLIC PANEL
ALT: 14 U/L (ref 6–29)
AST: 17 U/L (ref 10–35)
Albumin: 3.9 g/dL (ref 3.6–5.1)
Alkaline Phosphatase: 86 U/L (ref 33–115)
BUN: 11 mg/dL (ref 7–25)
CALCIUM: 8.8 mg/dL (ref 8.6–10.2)
CHLORIDE: 104 mmol/L (ref 98–110)
CO2: 24 mmol/L (ref 20–31)
Creat: 1.01 mg/dL (ref 0.50–1.10)
GLUCOSE: 104 mg/dL — AB (ref 65–99)
POTASSIUM: 4.3 mmol/L (ref 3.5–5.3)
Sodium: 138 mmol/L (ref 135–146)
Total Bilirubin: 0.3 mg/dL (ref 0.2–1.2)
Total Protein: 5.9 g/dL — ABNORMAL LOW (ref 6.1–8.1)

## 2016-10-29 NOTE — Progress Notes (Signed)
Shanaia here for week 192 visit A5321, Decay of HIV-1 Reservoirs in Patients on Long-Term Antiretroviral Therapy: the ACTG HIV [facility]) study.  No new complaints or concerns verbalized. Influenza vaccine given (R) deltoid. Site unremarkable. Next study visit for A5322 scheduled for January.

## 2016-10-30 LAB — HEPATITIS C ANTIBODY: HCV Ab: NEGATIVE

## 2016-11-07 LAB — HIV-1 RNA QUANT-NO REFLEX-BLD: HIV-1 RNA Viral Load: <40

## 2016-11-14 ENCOUNTER — Encounter: Payer: Self-pay | Admitting: *Deleted

## 2016-11-27 ENCOUNTER — Ambulatory Visit: Payer: BC Managed Care – PPO | Admitting: Internal Medicine

## 2016-11-28 ENCOUNTER — Telehealth: Payer: Self-pay | Admitting: Medical

## 2016-11-28 NOTE — Telephone Encounter (Signed)
Mammogram normal/no worrisome findings.  Get her in for physical in 11/2016

## 2016-11-29 NOTE — Telephone Encounter (Signed)
Called patient gave the results and info. appt her appt.

## 2016-12-04 ENCOUNTER — Encounter: Payer: Self-pay | Admitting: Internal Medicine

## 2016-12-05 ENCOUNTER — Ambulatory Visit (INDEPENDENT_AMBULATORY_CARE_PROVIDER_SITE_OTHER): Payer: BC Managed Care – PPO | Admitting: Internal Medicine

## 2016-12-05 ENCOUNTER — Encounter: Payer: Self-pay | Admitting: Internal Medicine

## 2016-12-05 DIAGNOSIS — M25562 Pain in left knee: Secondary | ICD-10-CM

## 2016-12-05 DIAGNOSIS — G8929 Other chronic pain: Secondary | ICD-10-CM

## 2016-12-05 DIAGNOSIS — B20 Human immunodeficiency virus [HIV] disease: Secondary | ICD-10-CM | POA: Diagnosis not present

## 2016-12-05 DIAGNOSIS — R739 Hyperglycemia, unspecified: Secondary | ICD-10-CM

## 2016-12-05 DIAGNOSIS — E6609 Other obesity due to excess calories: Secondary | ICD-10-CM | POA: Diagnosis not present

## 2016-12-05 NOTE — Assessment & Plan Note (Signed)
She has mild hyperglycemia. With her morbid obesity and strong family history of diabetes that certainly puts her at risk for developing diabetes. I reemphasize lifestyle modification.

## 2016-12-05 NOTE — Assessment & Plan Note (Signed)
I talked to her again at length about lifestyle modification. I suggested that she try to plan advance to prepare her own meals so she does not have to eat and fast food restaurants as frequently. I also suggested that she work on portion control. She is in the habit of eating 1 large meal before bedtime. I suggested she try eating 2-3 smaller meals throughout the day and increasing her activity level. She has a friend who is interested in helping her lose weight. She describes her friend as a "health nut".

## 2016-12-05 NOTE — Assessment & Plan Note (Signed)
Her infection remains under excellent long-term control. She will continue Genvoya in follow-up in one year. I told her that I do not believe the Genvoya is contributing to her weight gain. She isn't agreeable with continuing it.

## 2016-12-05 NOTE — Progress Notes (Signed)
Patient Active Problem List   Diagnosis Date Noted  . Human immunodeficiency virus (HIV) disease ([place]) 01/24/2007    Priority: High  . Left knee pain 11/23/2015    Priority: Medium  . Abnormal dreams 01/06/2014    Priority: Medium  . Obesity 11/06/2007    Priority: Medium  . Hyperglycemia 12/05/2016  . Encounter for health maintenance examination in adult 12/22/2015  . Vitamin D deficiency 12/22/2015  . Need for Tdap vaccination 12/22/2015  . Alkaline phosphatase elevation 12/22/2015  . Elevated blood pressure reading without diagnosis of hypertension 12/22/2015  . Rosacea 04/22/2012  . URINARY INCONTINENCE, URGE 10/24/2010  . Hyperlipidemia 11/06/2007  . URTICARIA, IDIOPATHIC 03/06/2007    Patient's Medications  New Prescriptions   No medications on file  Previous Medications   CETIRIZINE (ZYRTEC) 10 MG TABLET    Take 10 mg by mouth daily as needed for allergies (For itching.). Reported on 02/29/2016   CHOLECALCIFEROL (VITAMIN D3) 2000 UNITS CAPSULE    Take 1 capsule (2,000 Units total) by mouth daily.   DIPHENHYDRAMINE (BENADRYL) 25 MG TABLET    Take 25 mg by mouth every 6 (six) hours as needed for itching or allergies. Reported on 02/29/2016   ELVITEGRAVIR-COBICISTAT-EMTRICITABINE-TENOFOVIR (GENVOYA) 150-150-200-10 MG TABS TABLET    Take 1 tablet by mouth daily with breakfast.   ERGOCALCIFEROL (VITAMIN D2) 50000 UNITS CAPSULE    Take 50,000 Units by mouth once a week. Reported on 02/29/2016   IBUPROFEN (ADVIL,MOTRIN) 800 MG TABLET    Take 0.5-1 tablets (400-800 mg total) by mouth every 8 (eight) hours as needed for mild pain, moderate pain or cramping.   MUPIROCIN OINTMENT (BACTROBAN) 2 %    Place 1 application into the nose 3 (three) times daily.  Modified Medications   No medications on file  Discontinued Medications   No medications on file    Subjective: Jaclynne is in for her first visit in one year. She is followed in her research unit and has been getting  regular evaluation therapy. She recalls missing only one dose of her Genvoya in the past year. That occurred when she fell asleep before taking it. She asked me if Genvoya would be contributing to her weight gain. She has been gaining weight steadily over the past 4 years. Last year she had problem with left knee pain and this did affect her activity level. She also admits that she eats out in restaurants and fast food places frequently. She says that she is a very picky eater and does not like many vegetables. She has a strong family history of diabetes and heart disease.   Review of Systems: Review of Systems  Constitutional: Negative for chills, diaphoresis, fever, malaise/fatigue and weight loss.  HENT: Negative for sore throat.   Respiratory: Negative for cough, sputum production and shortness of breath.   Cardiovascular: Negative for chest pain.  Gastrointestinal: Negative for abdominal pain, diarrhea, heartburn, nausea and vomiting.  Genitourinary: Negative for dysuria and frequency.  Musculoskeletal: Positive for joint pain. Negative for myalgias.       Intermittent left knee pain.  Skin: Negative for rash.  Neurological: Negative for dizziness and headaches.  Psychiatric/Behavioral: Negative for depression and substance abuse. The patient is not nervous/anxious.     Past Medical History:  Diagnosis Date  . Allergic rhinitis   . HIV infection ([place]) 2006  . Idiopathic urticaria   . Rosacea     Social History  Substance Use Topics  .  Smoking status: Never Smoker  . Smokeless tobacco: Never Used  . Alcohol use 0.0 oz/week     Comment: occassional (special occasions)    Family History  Problem Relation Age of Onset  . Diabetes Mother   . Diabetes Brother   . Heart disease Paternal Uncle   . Heart disease Maternal Grandmother   . Cancer Maternal Grandmother   . Heart disease Paternal Grandmother     Allergies  Allergen Reactions  . Amoxicillin Rash  . Aspirin Anxiety    . Sulfamethoxazole-Trimethoprim Rash    Objective:  Vitals:   12/05/16 1630  BP: 130/82  Pulse: 79  Temp: 98.6 F (37 C)  TempSrc: Oral  Weight: 269 lb (122 kg)   Body mass index is 47.65 kg/m.  Physical Exam  Constitutional: She is oriented to person, place, and time.  She is in good spirits. She has been gaining about 10 pounds annually for the past several years.  HENT:  Mouth/Throat: No oropharyngeal exudate.  Eyes: Conjunctivae are normal.  Cardiovascular: Normal rate and regular rhythm.   No murmur heard. Pulmonary/Chest: Effort normal and breath sounds normal. She has no wheezes. She has no rales.  Abdominal: Soft. She exhibits no mass. There is no tenderness.  Musculoskeletal: Normal range of motion. She exhibits no edema or tenderness.  Neurological: She is alert and oriented to person, place, and time.  Skin: No rash noted.  Psychiatric: Mood and affect normal.    Lab Results Lab Results  Component Value Date   WBC 6.2 02/13/2016   HGB 13.4 02/13/2016   HCT 41.4 02/13/2016   MCV 91.6 02/13/2016   PLT 273 02/13/2016    Lab Results  Component Value Date   CREATININE 1.01 10/29/2016   BUN 11 10/29/2016   NA 138 10/29/2016   K 4.3 10/29/2016   CL 104 10/29/2016   CO2 24 10/29/2016    Lab Results  Component Value Date   ALT 14 10/29/2016   AST 17 10/29/2016   ALKPHOS 86 10/29/2016   BILITOT 0.3 10/29/2016    Lab Results  Component Value Date   CHOL 181 02/13/2016   HDL 36 (L) 02/13/2016   LDLCALC 110 02/13/2016   TRIG 176 (H) 02/13/2016   CHOLHDL 5.0 02/13/2016   HIV 1 RNA Quant (copies/mL)  Date Value  07/24/2010 39  03/01/2010 39  08/04/2009 39   HIV-1 RNA Viral Load (no units)  Date Value  10/29/2016 <40  04/09/2016 <40  11/08/2015 <40   CD4 (no units)  Date Value  11/08/2015 550  02/08/2015 443  12/21/2013 475     Problem List Items Addressed This Visit      High   Human immunodeficiency virus (HIV) disease ([place])     Her infection remains under excellent long-term control. She will continue Genvoya in follow-up in one year. I told her that I do not believe the Genvoya is contributing to her weight gain. She isn't agreeable with continuing it.      Relevant Orders   T-helper cell (CD4)- ([facility] only)   HIV 1 RNA quant-no reflex-bld   CBC   Comprehensive metabolic panel   RPR   Lipid panel     Medium   Left knee pain    She probably has some degenerative arthritis exacerbated by her weight gain.      Obesity    I talked to her again at length about lifestyle modification. I suggested that she try to plan advance to  prepare her own meals so she does not have to eat and fast food restaurants as frequently. I also suggested that she work on portion control. She is in the habit of eating 1 large meal before bedtime. I suggested she try eating 2-3 smaller meals throughout the day and increasing her activity level. She has a friend who is interested in helping her lose weight. She describes her friend as a "health nut".        Unprioritized   Hyperglycemia    She has mild hyperglycemia. With her morbid obesity and strong family history of diabetes that certainly puts her at risk for developing diabetes. I reemphasize lifestyle modification.           Michel Bickers, MD [facility] [place] [facility] [phone] pager   [phone] cell 12/05/2016, 5:03 PM

## 2016-12-05 NOTE — Assessment & Plan Note (Signed)
She probably has some degenerative arthritis exacerbated by her weight gain.

## 2016-12-12 ENCOUNTER — Other Ambulatory Visit: Payer: Self-pay | Admitting: Internal Medicine

## 2016-12-12 DIAGNOSIS — B20 Human immunodeficiency virus [HIV] disease: Secondary | ICD-10-CM

## 2017-01-01 ENCOUNTER — Other Ambulatory Visit: Payer: Self-pay | Admitting: Internal Medicine

## 2017-01-01 DIAGNOSIS — B20 Human immunodeficiency virus [HIV] disease: Secondary | ICD-10-CM

## 2017-02-01 ENCOUNTER — Ambulatory Visit
Admission: RE | Admit: 2017-02-01 | Discharge: 2017-02-01 | Disposition: A | Discharge status: Home / Self Care | Payer: BC Managed Care – PPO | Source: Ambulatory Visit | Attending: Medical | Admitting: Medical

## 2017-02-01 ENCOUNTER — Ambulatory Visit (INDEPENDENT_AMBULATORY_CARE_PROVIDER_SITE_OTHER): Payer: BC Managed Care – PPO | Admitting: Medical

## 2017-02-01 ENCOUNTER — Encounter: Payer: Self-pay | Admitting: Medical

## 2017-02-01 VITALS — BP 130/80 | HR 72 | Temp 98.3°F | Wt 267.4 lb

## 2017-02-01 DIAGNOSIS — R05 Cough: Secondary | ICD-10-CM

## 2017-02-01 DIAGNOSIS — R059 Cough, unspecified: Secondary | ICD-10-CM

## 2017-02-01 DIAGNOSIS — R509 Fever, unspecified: Secondary | ICD-10-CM

## 2017-02-01 DIAGNOSIS — R142 Eructation: Secondary | ICD-10-CM | POA: Diagnosis not present

## 2017-02-01 DIAGNOSIS — B20 Human immunodeficiency virus [HIV] disease: Secondary | ICD-10-CM

## 2017-02-01 DIAGNOSIS — K30 Functional dyspepsia: Secondary | ICD-10-CM | POA: Diagnosis not present

## 2017-02-01 MED ORDER — BENZONATATE 200 MG PO CAPS: 200.0000 mg | ORAL_CAPSULE | Freq: Three times a day (TID) | ORAL | 0 refills | Status: DC | PRN

## 2017-02-01 MED ORDER — OMEPRAZOLE 40 MG PO CPDR: 40.0000 mg | DELAYED_RELEASE_CAPSULE | Freq: Every day | ORAL | 0 refills | Status: DC

## 2017-02-01 NOTE — Progress Notes (Signed)
Subjective: Chief Complaint  Patient presents with  . coughing    coughing x2 months   Here for cough.  Had a "bug" in December.  She reports cough, having intermittent fevers, indigestion, sometimes feels like she can't get something out of her chest.   Has had some sore throat.  This started with fever, productive cough, sore throat, chills.   Has had some nausea and vomiting.  But in recent weeks mainly fever and cough.   Currently no sneezing or head congestion.  Has had some recent headaches.   Feels tired.  Doesn't feel particularly sick.   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   Using some mucinex, Theraflu at night.  No hx/o asthma, no major problems with GERD in the past.  Having a lot of belching.  No other aggravating or relieving factors. No other complaint.  Past Medical History:  Diagnosis Date  . Allergic rhinitis   . HIV infection ([place]) 2006  . Idiopathic urticaria   . Rosacea    Current Outpatient Prescriptions on File Prior to Visit  Medication Sig Dispense Refill  . diphenhydrAMINE (BENADRYL) 25 MG tablet Take 25 mg by mouth every 6 (six) hours as needed for itching or allergies. Reported on 02/29/2016    . GENVOYA 150-150-200-10 MG TABS tablet TAKE ONE TABLET BY MOUTH ONCE DAILY WITH BREAKFAST. 30 tablet 8  . GENVOYA 150-150-200-10 MG TABS tablet TAKE ONE TABLET BY MOUTH ONCE DAILY WITH BREAKFAST. 30 tablet 5  . ibuprofen (ADVIL,MOTRIN) 800 MG tablet Take 0.5-1 tablets (400-800 mg total) by mouth every 8 (eight) hours as needed for mild pain, moderate pain or cramping. 30 tablet 0  . mupirocin ointment (BACTROBAN) 2 % Place 1 application into the nose 3 (three) times daily. 22 g 0  . Cholecalciferol (VITAMIN D3) 2000 UNITS capsule Take 1 capsule (2,000 Units total) by mouth daily. (Patient not taking: Reported on 02/01/2017) 90 capsule 3   No current facility-administered medications on file prior to visit.    ROS as in  subjective   Objective: BP 130/80   Pulse 72   Temp 98.3 F (36.8 C)   Wt 267 lb 6.4 oz (121.3 kg)   SpO2 96%   BMI 47.37 kg/m   Wt Readings from Last 3 Encounters:  02/01/17 267 lb 6.4 oz (121.3 kg)  12/05/16 269 lb (122 kg)  10/29/16 266 lb 8 oz (120.9 kg)   General appearance: alert, no distress, WD/WN,  HEENT: normocephalic, sclerae anicteric, PERRLA, EOMi, nares patent, no discharge or erythema, pharynx normal Oral cavity: MMM, no lesions Neck: supple, no lymphadenopathy, no thyromegaly, no masses, no JVD Heart: RRR, normal S1, S2, no murmurs Lungs: CTA bilaterally, no wheezes, rhonchi, or rales Abdomen: +bs, soft, non tender, non distended, no masses, no hepatomegaly, no splenomegaly Extremities: no edema, no cyanosis, no clubbing Pulses: 2+ symmetric, upper and lower extremities, normal cap refill Neurological: nonfocal   Assessment: Encounter Diagnoses  Name Primary?  . Cough Yes  . Fever, unspecified fever cause   . Human immunodeficiency virus (HIV) disease ([place])   . Belching   . Indigestion      Plan: Discussed possible causes of cough.  The fevers she mentions are questionable, but given her underlying hx/o, will get chest xray.   Begin medications bellow.   Advised she avoid GERD triggers, sleep inclined the next week, NPO 2 hours before bedtime.  F/u pending CXR.  Dnya was seen today for  coughing.  Diagnoses and all orders for this visit:  Cough -     DG Chest 2 View; Future  Fever, unspecified fever cause -     DG Chest 2 View; Future  Human immunodeficiency virus (HIV) disease ([place]) -     DG Chest 2 View; Future  Belching -     DG Chest 2 View; Future  Indigestion -     DG Chest 2 View; Future  Other orders -     omeprazole (PRILOSEC) 40 MG capsule; Take 1 capsule (40 mg total) by mouth daily. -     benzonatate (TESSALON) 200 MG capsule; Take 1 capsule (200 mg total) by mouth 3 (three) times daily as needed for cough.

## 2017-02-28 ENCOUNTER — Other Ambulatory Visit: Payer: Self-pay | Admitting: Medical

## 2017-03-12 ENCOUNTER — Ambulatory Visit (INDEPENDENT_AMBULATORY_CARE_PROVIDER_SITE_OTHER): Payer: BC Managed Care – PPO | Admitting: Medical

## 2017-03-12 ENCOUNTER — Encounter: Payer: Self-pay | Admitting: Medical

## 2017-03-12 VITALS — BP 142/84 | HR 68 | Temp 97.7°F | Wt 268.4 lb

## 2017-03-12 DIAGNOSIS — J209 Acute bronchitis, unspecified: Secondary | ICD-10-CM

## 2017-03-12 MED ORDER — AZITHROMYCIN 250 MG PO TABS: ORAL_TABLET | ORAL | 0 refills | Status: DC

## 2017-03-12 MED ORDER — HYDROCOD POLST-CPM POLST ER 10-8 MG/5ML PO SUER: 5.0000 mL | Freq: Two times a day (BID) | ORAL | 0 refills | Status: DC | PRN

## 2017-03-12 NOTE — Progress Notes (Signed)
Subjective:  Margaret Porter is a 47 y.o. female who presents for illness.  She notes 8-9 days of illness.  Has sore throat, nasal congestion, head congestion, fever.   Some symptoms improved, but currently still has cough, nasal congestion, sinus congestion.   No teeth pain.  No NVD, cough is productive though.   Some sick contacts at school.   Using mucinex.  Past history is significant for HIV disease.   Patient is not a smoker.  No other aggravating or relieving factors.  I saw her 1.5 months ago for similar. Has had some body aches and chills about a week ago but those symptoms resolved.   Had gotten better, but this seems to be new illness.  No other c/o.  The following portions of the patient's history were reviewed and updated as appropriate: allergies, current medications, past family history, past medical history, past social history, past surgical history and problem list.  ROS as in subjective  Past Medical History:  Diagnosis Date  . Allergic rhinitis   . HIV infection ([place]) 2006  . Idiopathic urticaria   . Rosacea      Objective: BP (!) 142/84   Pulse 68   Temp 97.7 F (36.5 C)   Wt 268 lb 6.4 oz (121.7 kg)   SpO2 98%   BMI 47.54 kg/m   General appearance: Alert, WD/WN, no distress,somewhat ill appearing                             Skin: warm, no rash, no diaphoresis                           Head: no sinus tenderness                            Eyes: conjunctiva normal, corneas clear, PERRLA                            Ears: pearly TMs, external ear canals normal                          Nose: septum midline, turbinates swollen, with erythema and clear discharge             Mouth/throat: MMM, tongue normal, mild pharyngeal erythema                           Neck: supple, no adenopathy, no thyromegaly, nontender                          Heart: RRR, normal S1, S2, no murmurs                         Lungs: +bronchial breath sounds, +scattered rhonchi, no wheezes, no  rales                Extremities: no edema, nontender      Assessment: Encounter Diagnosis  Name Primary?  . Acute bronchitis, unspecified organism Yes     Plan:  Discussed diagnosis and treatment.  Suggested symptomatic OTC remedies for cough and congestion.  Medications below.  Tylenol or Ibuprofen OTC for fever and malaise.  Call/return in 2-3 days if symptoms  are worse or not improving.  Advised that cough may linger even after the infection is improved.     Margaret Porter was seen today for coughing, congestion.  Diagnoses and all orders for this visit:  Acute bronchitis, unspecified organism  Other orders -     azithromycin (ZITHROMAX) 250 MG tablet; 2 tablets day 1, then 1 tablet days 2-4 -     chlorpheniramine-HYDROcodone (TUSSIONEX PENNKINETIC ER) 10-8 MG/5ML SUER; Take 5 mLs by mouth every 12 (twelve) hours as needed for cough.

## 2017-03-21 ENCOUNTER — Encounter: Payer: Self-pay | Admitting: Medical

## 2017-04-04 ENCOUNTER — Encounter (INDEPENDENT_AMBULATORY_CARE_PROVIDER_SITE_OTHER): Payer: Self-pay | Admitting: *Deleted

## 2017-04-04 VITALS — BP 122/83 | HR 61 | Temp 97.4°F | Wt 264.5 lb

## 2017-04-04 DIAGNOSIS — Z006 Encounter for examination for normal comparison and control in clinical research program: Secondary | ICD-10-CM

## 2017-04-04 LAB — COMPREHENSIVE METABOLIC PANEL
ALT: 15 U/L (ref 6–29)
AST: 17 U/L (ref 10–35)
Albumin: 3.9 g/dL (ref 3.6–5.1)
Alkaline Phosphatase: 104 U/L (ref 33–115)
BILIRUBIN TOTAL: 0.3 mg/dL (ref 0.2–1.2)
BUN: 9 mg/dL (ref 7–25)
CHLORIDE: 106 mmol/L (ref 98–110)
CO2: 24 mmol/L (ref 20–31)
CREATININE: 0.89 mg/dL (ref 0.50–1.10)
Calcium: 8.5 mg/dL — ABNORMAL LOW (ref 8.6–10.2)
GLUCOSE: 101 mg/dL — AB (ref 65–99)
Potassium: 4.2 mmol/L (ref 3.5–5.3)
SODIUM: 139 mmol/L (ref 135–146)
Total Protein: 6.1 g/dL (ref 6.1–8.1)

## 2017-04-04 LAB — LIPID PANEL
Cholesterol: 165 mg/dL (ref <200)
HDL: 35 mg/dL — ABNORMAL LOW (ref >50)
LDL Cholesterol: 106 mg/dL — ABNORMAL HIGH (ref <100)
TRIGLYCERIDES: 119 mg/dL (ref <150)
Total CHOL/HDL Ratio: 4.7 Ratio (ref <5.0)
VLDL: 24 mg/dL (ref <30)

## 2017-04-04 NOTE — Progress Notes (Signed)
Margaret Porter is here for week 192 visit for A5322, HAILO Study: A Long Term follow-up of Older HIV-Infected Adults in the ACTG, an observational study addressing the issues of aging, HIV infection and Inflammation. No new complaints or concerns verbalized. Next study visit for A5321 scheduled for May.

## 2017-04-05 LAB — HEPATITIS B SURFACE ANTIBODY,QUALITATIVE: HEP B S AB: NEGATIVE

## 2017-04-05 LAB — HEPATITIS B SURFACE ANTIGEN: Hepatitis B Surface Ag: NEGATIVE

## 2017-04-05 LAB — HEPATITIS C ANTIBODY: HCV Ab: NEGATIVE

## 2017-04-05 LAB — HEMOGLOBIN A1C
HEMOGLOBIN A1C: 5.1 % (ref <5.7)
MEAN PLASMA GLUCOSE: 100 mg/dL

## 2017-04-26 ENCOUNTER — Encounter: Payer: Self-pay | Admitting: *Deleted

## 2017-04-26 LAB — HIV-1 RNA QUANT-NO REFLEX-BLD: HIV-1 RNA Viral Load: <40

## 2017-05-22 ENCOUNTER — Encounter (INDEPENDENT_AMBULATORY_CARE_PROVIDER_SITE_OTHER): Payer: BC Managed Care – PPO | Admitting: *Deleted

## 2017-05-22 VITALS — BP 126/82 | HR 69 | Temp 98.1°F | Wt 268.8 lb

## 2017-05-22 DIAGNOSIS — Z006 Encounter for examination for normal comparison and control in clinical research program: Secondary | ICD-10-CM

## 2017-05-22 LAB — COMPREHENSIVE METABOLIC PANEL
ALBUMIN: 4 g/dL (ref 3.6–5.1)
ALT: 18 U/L (ref 6–29)
AST: 19 U/L (ref 10–35)
Alkaline Phosphatase: 99 U/L (ref 33–115)
BILIRUBIN TOTAL: 0.3 mg/dL (ref 0.2–1.2)
BUN: 12 mg/dL (ref 7–25)
CO2: 24 mmol/L (ref 20–31)
CREATININE: 0.83 mg/dL (ref 0.50–1.10)
Calcium: 8.6 mg/dL (ref 8.6–10.2)
Chloride: 106 mmol/L (ref 98–110)
Glucose, Bld: 102 mg/dL — ABNORMAL HIGH (ref 65–99)
Potassium: 4.2 mmol/L (ref 3.5–5.3)
SODIUM: 138 mmol/L (ref 135–146)
TOTAL PROTEIN: 5.9 g/dL — AB (ref 6.1–8.1)

## 2017-05-22 LAB — HIV-1 RNA QUANT-NO REFLEX-BLD

## 2017-05-22 NOTE — Progress Notes (Signed)
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  She denies any new problems or concerns. She will be coming back in August and will schedule an appt. For Dr. Megan Salon then.

## 2017-06-12 ENCOUNTER — Encounter: Payer: Self-pay | Admitting: *Deleted

## 2017-08-06 ENCOUNTER — Encounter (INDEPENDENT_AMBULATORY_CARE_PROVIDER_SITE_OTHER): Payer: Self-pay | Admitting: *Deleted

## 2017-08-06 VITALS — BP 118/85 | HR 66 | Temp 97.9°F | Wt 263.5 lb

## 2017-08-06 DIAGNOSIS — Z006 Encounter for examination for normal comparison and control in clinical research program: Secondary | ICD-10-CM

## 2017-08-06 NOTE — Progress Notes (Signed)
Margaret Porter is here for week 36 for The HAILO Study: A Long Term follow-up of Older HIV-Infected Adults in the ACTG, an observational study addressing the issues of aging, HIV infection and Inflammation. She denies any new problems or medications, but is concerned about a red spot on her left cheek that will not heal. I did tell her she needed to get a dermatologist to check it out. She will be returning for the next study visit in November.

## 2017-09-07 ENCOUNTER — Emergency Department (HOSPITAL_COMMUNITY): Payer: BC Managed Care – PPO | Admitting: Anesthesiology

## 2017-09-07 ENCOUNTER — Encounter (HOSPITAL_COMMUNITY): Admission: EM | Disposition: A | Discharge status: Home / Self Care | Payer: Self-pay | Source: Home / Self Care

## 2017-09-07 ENCOUNTER — Encounter (HOSPITAL_COMMUNITY): Payer: Self-pay

## 2017-09-07 ENCOUNTER — Emergency Department (HOSPITAL_COMMUNITY): Payer: BC Managed Care – PPO

## 2017-09-07 ENCOUNTER — Ambulatory Visit (HOSPITAL_COMMUNITY)
Admission: EM | Admit: 2017-09-07 | Discharge: 2017-09-07 | Disposition: A | Discharge status: Home / Self Care | Payer: BC Managed Care – PPO | Attending: Family Medicine | Admitting: Family Medicine

## 2017-09-07 ENCOUNTER — Inpatient Hospital Stay (HOSPITAL_COMMUNITY)
Admission: EM | Admit: 2017-09-07 | Discharge: 2017-09-11 | DRG: 339 | Disposition: A | Discharge status: Home / Self Care | Payer: BC Managed Care – PPO | Attending: Surgery | Admitting: Surgery

## 2017-09-07 ENCOUNTER — Encounter (HOSPITAL_COMMUNITY): Payer: Self-pay | Admitting: Emergency Medicine

## 2017-09-07 ENCOUNTER — Ambulatory Visit: Payer: Self-pay | Admitting: Surgery

## 2017-09-07 DIAGNOSIS — K3532 Acute appendicitis with perforation and localized peritonitis, without abscess: Secondary | ICD-10-CM | POA: Diagnosis present

## 2017-09-07 DIAGNOSIS — I96 Gangrene, not elsewhere classified: Secondary | ICD-10-CM | POA: Diagnosis present

## 2017-09-07 DIAGNOSIS — R509 Fever, unspecified: Secondary | ICD-10-CM

## 2017-09-07 DIAGNOSIS — K219 Gastro-esophageal reflux disease without esophagitis: Secondary | ICD-10-CM | POA: Diagnosis present

## 2017-09-07 DIAGNOSIS — R112 Nausea with vomiting, unspecified: Secondary | ICD-10-CM

## 2017-09-07 DIAGNOSIS — K5641 Fecal impaction: Secondary | ICD-10-CM | POA: Diagnosis present

## 2017-09-07 DIAGNOSIS — K353 Acute appendicitis with localized peritonitis: Principal | ICD-10-CM | POA: Diagnosis present

## 2017-09-07 DIAGNOSIS — Z21 Asymptomatic human immunodeficiency virus [HIV] infection status: Secondary | ICD-10-CM | POA: Diagnosis present

## 2017-09-07 DIAGNOSIS — R1031 Right lower quadrant pain: Secondary | ICD-10-CM

## 2017-09-07 DIAGNOSIS — Z833 Family history of diabetes mellitus: Secondary | ICD-10-CM

## 2017-09-07 DIAGNOSIS — Z6841 Body Mass Index (BMI) 40.0 and over, adult: Secondary | ICD-10-CM

## 2017-09-07 DIAGNOSIS — K352 Acute appendicitis with generalized peritonitis: Secondary | ICD-10-CM | POA: Diagnosis not present

## 2017-09-07 DIAGNOSIS — Z8249 Family history of ischemic heart disease and other diseases of the circulatory system: Secondary | ICD-10-CM

## 2017-09-07 HISTORY — PX: LAPAROSCOPIC APPENDECTOMY: SHX408

## 2017-09-07 LAB — COMPREHENSIVE METABOLIC PANEL
ALK PHOS: 102 U/L (ref 38–126)
ALT: 16 U/L (ref 14–54)
AST: 26 U/L (ref 15–41)
Albumin: 3.4 g/dL — ABNORMAL LOW (ref 3.5–5.0)
Anion gap: 9 (ref 5–15)
BILIRUBIN TOTAL: 1.1 mg/dL (ref 0.3–1.2)
BUN: 6 mg/dL (ref 6–20)
CO2: 21 mmol/L — ABNORMAL LOW (ref 22–32)
CREATININE: 0.99 mg/dL (ref 0.44–1.00)
Calcium: 8.6 mg/dL — ABNORMAL LOW (ref 8.9–10.3)
Chloride: 102 mmol/L (ref 101–111)
GFR calc Af Amer: >60 mL/min (ref >60)
Glucose, Bld: 111 mg/dL — ABNORMAL HIGH (ref 65–99)
Potassium: 4 mmol/L (ref 3.5–5.1)
Sodium: 132 mmol/L — ABNORMAL LOW (ref 135–145)
TOTAL PROTEIN: 6.7 g/dL (ref 6.5–8.1)

## 2017-09-07 LAB — CBC
HCT: 41.3 % (ref 36.0–46.0)
Hemoglobin: 13.7 g/dL (ref 12.0–15.0)
MCH: 31.5 pg (ref 26.0–34.0)
MCHC: 33.2 g/dL (ref 30.0–36.0)
MCV: 94.9 fL (ref 78.0–100.0)
PLATELETS: 243 10*3/uL (ref 150–400)
RBC: 4.35 MIL/uL (ref 3.87–5.11)
RDW: 13.9 % (ref 11.5–15.5)
WBC: 16.8 10*3/uL — ABNORMAL HIGH (ref 4.0–10.5)

## 2017-09-07 LAB — URINALYSIS, ROUTINE W REFLEX MICROSCOPIC
Bilirubin Urine: NEGATIVE
GLUCOSE, UA: NEGATIVE mg/dL
KETONES UR: 20 mg/dL — AB
Leukocytes, UA: NEGATIVE
Nitrite: NEGATIVE
PH: 5 (ref 5.0–8.0)
Protein, ur: 100 mg/dL — AB
SPECIFIC GRAVITY, URINE: 1.028 (ref 1.005–1.030)

## 2017-09-07 LAB — LIPASE, BLOOD: LIPASE: 23 U/L (ref 11–51)

## 2017-09-07 LAB — PREGNANCY, URINE: Preg Test, Ur: NEGATIVE

## 2017-09-07 SURGERY — APPENDECTOMY, LAPAROSCOPIC
Anesthesia: General | Site: Abdomen

## 2017-09-07 MED ORDER — LACTATED RINGERS IV SOLN
INTRAVENOUS | Status: DC | PRN
  Administered 2017-09-07 – 2017-09-08 (×2): via INTRAVENOUS

## 2017-09-07 MED ORDER — SUFENTANIL CITRATE 50 MCG/ML IV SOLN
INTRAVENOUS | Status: AC
  Filled 2017-09-07: qty 1

## 2017-09-07 MED ORDER — METRONIDAZOLE IN NACL 5-0.79 MG/ML-% IV SOLN
500.0000 mg | Freq: Once | INTRAVENOUS | Status: AC
  Administered 2017-09-07: 500 mg via INTRAVENOUS
  Filled 2017-09-07: qty 100

## 2017-09-07 MED ORDER — CIPROFLOXACIN IN D5W 400 MG/200ML IV SOLN
400.0000 mg | Freq: Once | INTRAVENOUS | Status: AC
  Administered 2017-09-07: 400 mg via INTRAVENOUS
  Filled 2017-09-07: qty 200

## 2017-09-07 MED ORDER — BUPIVACAINE-EPINEPHRINE 0.25% -1:200000 IJ SOLN
INTRAMUSCULAR | Status: AC
  Filled 2017-09-07: qty 1

## 2017-09-07 MED ORDER — SODIUM CHLORIDE 0.9 % IV BOLUS (SEPSIS)
1000.0000 mL | Freq: Once | INTRAVENOUS | Status: AC
  Administered 2017-09-07: 1000 mL via INTRAVENOUS

## 2017-09-07 MED ORDER — IOPAMIDOL (ISOVUE-300) INJECTION 61%
INTRAVENOUS | Status: AC
  Administered 2017-09-07: 100 mL via INTRAVENOUS
  Filled 2017-09-07: qty 100

## 2017-09-07 MED ORDER — MORPHINE SULFATE (PF) 4 MG/ML IV SOLN
4.0000 mg | Freq: Once | INTRAVENOUS | Status: AC
  Administered 2017-09-07: 4 mg via INTRAVENOUS
  Filled 2017-09-07: qty 1

## 2017-09-07 MED ORDER — PROPOFOL 10 MG/ML IV BOLUS
INTRAVENOUS | Status: AC
  Filled 2017-09-07: qty 20

## 2017-09-07 MED ORDER — MIDAZOLAM HCL 2 MG/2ML IJ SOLN
INTRAMUSCULAR | Status: AC
  Filled 2017-09-07: qty 2

## 2017-09-07 MED ORDER — MORPHINE SULFATE (PF) 4 MG/ML IV SOLN
4.0000 mg | Freq: Once | INTRAVENOUS | Status: AC
  Administered 2017-09-07: 4 mg via INTRAVENOUS

## 2017-09-07 MED ORDER — MORPHINE SULFATE (PF) 4 MG/ML IV SOLN
INTRAVENOUS | Status: AC
  Filled 2017-09-07: qty 1

## 2017-09-07 MED ORDER — ONDANSETRON HCL 4 MG/2ML IJ SOLN
4.0000 mg | Freq: Once | INTRAMUSCULAR | Status: AC
  Administered 2017-09-07: 4 mg via INTRAVENOUS
  Filled 2017-09-07: qty 2

## 2017-09-07 SURGICAL SUPPLY — 44 items
APPLIER CLIP 5 13 M/L LIGAMAX5 (MISCELLANEOUS)
BLADE CLIPPER SURG (BLADE) IMPLANT
BLADE SURG 10 STRL SS (BLADE) ×2 IMPLANT
CANISTER SUCT 3000ML PPV (MISCELLANEOUS) ×2 IMPLANT
CHLORAPREP W/TINT 26ML (MISCELLANEOUS) ×2 IMPLANT
CLIP APPLIE 5 13 M/L LIGAMAX5 (MISCELLANEOUS) IMPLANT
COVER SURGICAL LIGHT HANDLE (MISCELLANEOUS) ×2 IMPLANT
CUTTER ENDO LINEAR 45M (STAPLE) ×2 IMPLANT
DERMABOND ADVANCED (GAUZE/BANDAGES/DRESSINGS) ×1
DERMABOND ADVANCED .7 DNX12 (GAUZE/BANDAGES/DRESSINGS) ×1 IMPLANT
DEVICE PMI PUNCTURE CLOSURE (MISCELLANEOUS) ×2 IMPLANT
DRAIN CHANNEL 19F RND (DRAIN) ×2 IMPLANT
ELECT CAUTERY BLADE 6.4 (BLADE) ×2 IMPLANT
ELECT REM PT RETURN 9FT ADLT (ELECTROSURGICAL) ×2
ELECTRODE REM PT RTRN 9FT ADLT (ELECTROSURGICAL) ×1 IMPLANT
EVACUATOR SILICONE 100CC (DRAIN) ×2 IMPLANT
GLOVE BIO SURGEON STRL SZ 6 (GLOVE) ×2 IMPLANT
GLOVE BIOGEL PI IND STRL 6.5 (GLOVE) ×1 IMPLANT
GLOVE BIOGEL PI INDICATOR 6.5 (GLOVE) ×1
GOWN STRL REUS W/ TWL LRG LVL3 (GOWN DISPOSABLE) ×3 IMPLANT
GOWN STRL REUS W/TWL LRG LVL3 (GOWN DISPOSABLE) ×3
KIT BASIN OR (CUSTOM PROCEDURE TRAY) ×2 IMPLANT
KIT ROOM TURNOVER OR (KITS) ×2 IMPLANT
NEEDLE INSUFFLATION 14GA 120MM (NEEDLE) ×2 IMPLANT
NS IRRIG 1000ML POUR BTL (IV SOLUTION) ×6 IMPLANT
PAD ARMBOARD 7.5X6 YLW CONV (MISCELLANEOUS) ×4 IMPLANT
POUCH SPECIMEN RETRIEVAL 10MM (ENDOMECHANICALS) ×2 IMPLANT
RELOAD 45 VASCULAR/THIN (ENDOMECHANICALS) ×2 IMPLANT
RELOAD STAPLE TA45 3.5 REG BLU (ENDOMECHANICALS) IMPLANT
SCISSORS ENDO CVD 5DCS (MISCELLANEOUS) ×2 IMPLANT
SET IRRIG TUBING LAPAROSCOPIC (IRRIGATION / IRRIGATOR) ×2 IMPLANT
SHEARS HARMONIC ACE PLUS 36CM (ENDOMECHANICALS) IMPLANT
SLEEVE ENDOPATH XCEL 5M (ENDOMECHANICALS) ×2 IMPLANT
SPECIMEN JAR SMALL (MISCELLANEOUS) ×2 IMPLANT
SUT ETHILON 2 0 FS 18 (SUTURE) ×2 IMPLANT
SUT MNCRL AB 4-0 PS2 18 (SUTURE) ×2 IMPLANT
TOWEL OR 17X24 6PK STRL BLUE (TOWEL DISPOSABLE) ×2 IMPLANT
TRAY FOLEY CATH SILVER 16FR (SET/KITS/TRAYS/PACK) ×2 IMPLANT
TRAY LAPAROSCOPIC MC (CUSTOM PROCEDURE TRAY) ×2 IMPLANT
TROCAR XCEL 12X100 BLDLESS (ENDOMECHANICALS) ×2 IMPLANT
TROCAR XCEL NON-BLD 5MMX100MML (ENDOMECHANICALS) ×2 IMPLANT
TUBE CONNECTING 12X1/4 (SUCTIONS) ×2 IMPLANT
TUBING INSUFFLATION (TUBING) ×2 IMPLANT
YANKAUER SUCT BULB TIP NO VENT (SUCTIONS) ×2 IMPLANT

## 2017-09-07 NOTE — ED Triage Notes (Addendum)
Pt c/o RLQ abd pain and emesis onset Thursday night. Pt reports she has continued to vomit but now it is just yellowish bile. Pt states fever was 102 F at the highest it was 100 F earlier today. Has not taken anything due to fear of vomiting.

## 2017-09-07 NOTE — ED Triage Notes (Signed)
Per Pt, Pt is coming from home with complaints of RLQ abdominal pain that started on Thursday with N/V. Pt denies any diarrhea, vaginal discharge, or urinary symptoms.

## 2017-09-07 NOTE — ED Notes (Signed)
Surgeon at the bedside.

## 2017-09-07 NOTE — Discharge Instructions (Signed)
Please proceed to the Emergency Department for further evaluation of your abdominal pain.

## 2017-09-07 NOTE — ED Notes (Signed)
Patient transported to CT 

## 2017-09-07 NOTE — H&P (Signed)
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 The pain was initially diffuse but localized to the right lower quadrant. The fevers and vomiting started the same day as the pain. No diarrhea. She has not had prior abdominal surgery.    Allergies  Allergen Reactions  . Amoxicillin Rash  . Aspirin Anxiety  . Sulfamethoxazole-Trimethoprim Rash    Past Medical History:  Diagnosis Date  . Allergic rhinitis   . HIV infection ([place]) 2006  . Idiopathic urticaria   . Rosacea     Past Surgical History:  Procedure Laterality Date  . DILATATION & CURRETTAGE/HYSTEROSCOPY WITH RESECTOCOPE N/A 06/27/2015   Procedure: DILATATION & CURETTAGE/HYSTEROSCOPY WITH RESECTOCOPE;  Surgeon: Servando Salina, MD;  Location: [facility];  Service: Gynecology;  Laterality: N/A;    Family History  Problem Relation Age of Onset  . Diabetes Mother   . Diabetes Brother   . Heart disease Paternal Uncle   . Heart disease Maternal Grandmother   . Cancer Maternal Grandmother   . Heart disease Paternal Grandmother     Social History   Social History  . Marital status: Single    Spouse name: N/A  . Number of children: N/A  . Years of education: N/A   Social History Main Topics  . Smoking status: Never Smoker  . Smokeless tobacco: Never Used  . Alcohol use 0.0 oz/week     Comment: occassional (special occasions)  . Drug use: No  . Sexual activity: Yes     Comment: given condoms   Other Topics Concern  . None   Social History Arboriculturist at [employer]   Brother is temporarily living with her.  Single, no children    No current facility-administered medications on file prior to encounter.    Current Outpatient Prescriptions on File Prior to Encounter  Medication Sig Dispense Refill  . azithromycin (ZITHROMAX) 250 MG tablet 2 tablets day 1,  then 1 tablet days 2-4 6 tablet 0  . chlorpheniramine-HYDROcodone (TUSSIONEX PENNKINETIC ER) 10-8 MG/5ML SUER Take 5 mLs by mouth every 12 (twelve) hours as needed for cough. 115 mL 0  . diphenhydrAMINE (BENADRYL) 25 MG tablet Take 25 mg by mouth every 6 (six) hours as needed for itching or allergies. Reported on 02/29/2016    . GENVOYA 150-150-200-10 MG TABS tablet TAKE ONE TABLET BY MOUTH ONCE DAILY WITH BREAKFAST. 30 tablet 8  . ibuprofen (ADVIL,MOTRIN) 800 MG tablet Take 0.5-1 tablets (400-800 mg total) by mouth every 8 (eight) hours as needed for mild pain, moderate pain or cramping. 30 tablet 0  . mupirocin ointment (BACTROBAN) 2 % Place 1 application into the nose 3 (three) times daily. 22 g 0    Review of Systems: a complete, 10pt review of systems was completed with pertinent positives and negatives as documented in the HPI  Physical Exam: Vitals:   09/07/17 2200 09/07/17 2220  BP: (!) 97/46 (!) 100/49  Pulse: 100 96  Resp: 20 (!) 22  Temp:    SpO2: 93% 96%   Gen: A&Ox3, no distress  Head: normocephalic, atraumatic, EOMI, anicteric.  Neck: supple without mass or thyromegaly Chest: unlabored respirations, symmetrical air entry   Cardiovascular: RRR with palpable distal pulses, no pedal edema Abdomen: obese, soft, nondistended, focally tender in right lower quadrant with localized guarding. No mass or organomegaly.  Extremities:  warm, without edema, no deformities Neuro: grossly intact, follows commands Psych: appropriate mood and affect, normal insight Skin: warm and dry   CBC Latest Ref Rng & Units 09/07/2017 02/13/2016 12/22/2015  WBC 4.0 - 10.5 K/uL 16.8(H) 6.2 6.3  Hemoglobin 12.0 - 15.0 g/dL 13.7 13.4 12.7  Hematocrit 36.0 - 46.0 % 41.3 41.4 39.7  Platelets 150 - 400 K/uL 243 273 288    CMP Latest Ref Rng & Units 09/07/2017 05/22/2017 04/04/2017  Glucose 65 - 99 mg/dL 111(H) 102(H) 101(H)  BUN 6 - 20 mg/dL 6 12 9   Creatinine 0.44 - 1.00 mg/dL 0.99 0.83 0.89  Sodium 135 -  145 mmol/L 132(L) 138 139  Potassium 3.5 - 5.1 mmol/L 4.0 4.2 4.2  Chloride 101 - 111 mmol/L 102 106 106  CO2 22 - 32 mmol/L 21(L) 24 24  Calcium 8.9 - 10.3 mg/dL 8.6(L) 8.6 8.5(L)  Total Protein 6.5 - 8.1 g/dL 6.7 5.9(L) 6.1  Total Bilirubin 0.3 - 1.2 mg/dL 1.1 0.3 0.3  Alkaline Phos 38 - 126 U/L 102 99 104  AST 15 - 41 U/L 26 19 17   ALT 14 - 54 U/L 16 18 15     No results found for: INR, PROTIME  Imaging: Ct Abdomen Pelvis W Contrast  Result Date: 09/07/2017 CLINICAL DATA:  Fever chills abdominal pain, right lower quadrant EXAM: CT ABDOMEN AND PELVIS WITH CONTRAST TECHNIQUE: Multidetector CT imaging of the abdomen and pelvis was performed using the standard protocol following bolus administration of intravenous contrast. CONTRAST:  160mL ISOVUE-300 IOPAMIDOL (ISOVUE-300) INJECTION 61% COMPARISON:  None. FINDINGS: Lower chest: Lung bases demonstrate streaky atelectasis posteriorly. Possible left anterior lung base subpleural nodule measuring 9 mm. Heart size within normal limits. Hepatobiliary: Probable focal fat infiltration near the falciform ligament. No calcified gallstones. No biliary dilatation Pancreas: Unremarkable. No pancreatic ductal dilatation or surrounding inflammatory changes. Spleen: Normal in size without focal abnormality. Adrenals/Urinary Tract: Adrenal glands are within normal limits. Kidneys demonstrate subcentimeter cortical hypodensities too small to further characterize. No hydronephrosis. Increased density in the left renal pelvis may reflect early excretion of contrast as opposed to a stone. There are however punctate stones present within both kidneys. The bladder is unremarkable Stomach/Bowel: The stomach is nonenlarged. No dilated small bowel. Enlarged appendix, measuring up to 14 mm and contains increased intraluminal density at the tip and orifice which may reflect small stones. Small gas collection paralleling the proximal lumen may reflect small contained perforation.  Moderate fluid and surrounding inflammatory reaction in the right lower quadrant. Vascular/Lymphatic: No significant vascular findings are present. No enlarged abdominal or pelvic lymph nodes. Reproductive: Uterus and bilateral adnexa are unremarkable. Other: Negative for free air.  Tiny fat in the umbilicus. Musculoskeletal: Multiple foci of sclerosis within the spine and pelvic bones. IMPRESSION: 1. Enlarged appendix with probable small stones in the proximal lumen and the tip. Suspected small extraluminal gas collection adjacent to the proximal portion of the appendix with moderate to marked inflammatory response in the right lower quadrant with small fluid. Findings are suspicious for acute appendicitis with contained perforation. No abscess. 2. Possible left anterior lung base subpleural nodule. Recommend nonemergent CT evaluation. 3. Multiple foci of sclerosis within the spine and pelvic bones, the pattern is somewhat suspicious for metastatic disease. Clinical correlation is recommended. Bone scan correlation as indicated. Electronically Signed   By: Donavan Foil M.D.   On: 09/07/2017 21:52    A/P: 47yo woman with acute appendicitis, suspected contained perforation and marked inflammatory response in surrounding  tissue. HIV+ -NPO, IVF, IV abx -To OR tonight for laparoscopic appendectomy. I have discussed the surgery with her including the risk of bleeding, infection, pain, scarring, intraabdominal injury, conversion to open surgery, staple line leak, and development of abscess. She expresses understanding and wishes to proceed -Admit to observation post-op   Romana Juniper, MD [facility], [place] Pager [phone]

## 2017-09-07 NOTE — Anesthesia Preprocedure Evaluation (Addendum)
Anesthesia Evaluation  Patient identified by MRN, date of birth, ID band Patient awake    Reviewed: Allergy & Precautions, NPO status , Patient's Chart, lab work & pertinent test results  History of Anesthesia Complications Negative for: history of anesthetic complications  Airway Mallampati: III  TM Distance: >3 FB Neck ROM: Full    Dental no notable dental hx. (+) Dental Advisory Given   Pulmonary neg pulmonary ROS,    Pulmonary exam normal breath sounds clear to auscultation       Cardiovascular negative cardio ROS Normal cardiovascular exam Rhythm:Regular Rate:Normal     Neuro/Psych negative neurological ROS  negative psych ROS   GI/Hepatic negative GI ROS, Neg liver ROS,   Endo/Other  Morbid obesity  Renal/GU negative Renal ROS     Musculoskeletal negative musculoskeletal ROS (+)   Abdominal (+) + obese,   Peds  Hematology  (+) HIV, Family is unaware of HIV status   Anesthesia Other Findings   Reproductive/Obstetrics hcg negative                             Anesthesia Physical  Anesthesia Plan  ASA: III and emergent  Anesthesia Plan: General   Post-op Pain Management:    Induction: Intravenous and Rapid sequence  PONV Risk Score and Plan: 3 and Ondansetron, Dexamethasone and Midazolam  Airway Management Planned: Oral ETT  Additional Equipment:   Intra-op Plan:   Post-operative Plan: Extubation in OR  Informed Consent: I have reviewed the patients History and Physical, chart, labs and discussed the procedure including the risks, benefits and alternatives for the proposed anesthesia with the patient or authorized representative who has indicated his/her understanding and acceptance.   Dental advisory given  Plan Discussed with: CRNA  Anesthesia Plan Comments:         Anesthesia Quick Evaluation

## 2017-09-07 NOTE — Anesthesia Procedure Notes (Signed)
Procedure Name: Intubation Date/Time: 09/07/2017 11:52 PM Performed by: Claris Che Pre-anesthesia Checklist: Patient identified, Emergency Drugs available, Suction available, Patient being monitored and Timeout performed Patient Re-evaluated:Patient Re-evaluated prior to induction Oxygen Delivery Method: Circle system utilized Preoxygenation: Pre-oxygenation with 100% oxygen Induction Type: IV induction, Rapid sequence and Cricoid Pressure applied Ventilation: Mask ventilation without difficulty Laryngoscope Size: Mac and 3 Grade View: Grade II Tube type: Oral Tube size: 7.5 mm Number of attempts: 1 Airway Equipment and Method: Stylet Placement Confirmation: ETT inserted through vocal cords under direct vision,  positive ETCO2 and breath sounds checked- equal and bilateral Secured at: 22 cm Tube secured with: Tape Dental Injury: Teeth and Oropharynx as per pre-operative assessment

## 2017-09-07 NOTE — ED Provider Notes (Signed)
  [place]   [id number] 09/07/17 Arrival Time: 1238  ASSESSMENT & PLAN:  1. Fever and chills   2. Abdominal pain, right lower quadrant   3. Non-intractable vomiting with nausea, unspecified vomiting type    Patient sent to the Emergency Department for further evaluation. Stable at discharge.  After Visit Summary given.   SUBJECTIVE:  Margaret Porter is a 47 y.o. female who presents with complaint of abdominal pain. Onset between 36-48 hours ago. Described as sharp. Location: RLQ without radiation. Described symptoms are gradually worsening. Aggravating factors: movement and recumbency. Alleviating factors: none. Associated symptoms: chills, fever, nausea and vomiting. No urinary symptoms or flank pain. Overall decreased appetite and PO intake History of similar symptoms: No. Patient's last menstrual period was 08/22/2017.  ROS: As per HPI. All other systems negative.  OBJECTIVE:  Vitals:   09/07/17 1344  BP: 121/78  Pulse: 77  Resp: 20  Temp: 100.3 F (37.9 C)  TempSrc: Oral  SpO2: 100%    General appearance: alert; appears uncomfortable; seated on exam table Lungs: clear to auscultation bilaterally Heart: regular rate and rhythm Abdomen: soft; moderate tenderness over R lower quadrant with voluntary guarding; bowel sounds normal; no masses or organomegaly Back: no CVA tenderness Extremities: no cyanosis or edema; symmetrical with no gross deformities Skin: warm and dry Neurologic: normal gait  Psychological: alert and cooperative; normal mood and affect  Allergies  Allergen Reactions  . Amoxicillin Rash  . Aspirin Anxiety  . Sulfamethoxazole-Trimethoprim Rash                                               Past Medical History:  Diagnosis Date  . Allergic rhinitis   . HIV infection ([place]) 2006  . Idiopathic urticaria   . Rosacea    Social History   Social History  . Marital status: Single    Spouse name: N/A  . Number of children: N/A  .  Years of education: N/A   Occupational History  . Not on file.   Social History Main Topics  . Smoking status: Never Smoker  . Smokeless tobacco: Never Used  . Alcohol use 0.0 oz/week     Comment: occassional (special occasions)  . Drug use: No  . Sexual activity: Yes     Comment: given condoms   Other Topics Concern  . 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   Brother is temporarily living with her.  Single, no children   Family History  Problem Relation Age of Onset  . Diabetes Mother   . Diabetes Brother   . Heart disease Paternal Uncle   . Heart disease Maternal Grandmother   . Cancer Maternal Grandmother   . Heart disease Paternal Grandmother    Past Surgical History:  Procedure Laterality Date  . DILATATION & CURRETTAGE/HYSTEROSCOPY WITH RESECTOCOPE N/A 06/27/2015   Procedure: DILATATION & CURETTAGE/HYSTEROSCOPY WITH RESECTOCOPE;  Surgeon: Servando Salina, MD;  Location: [facility];  Service: Gynecology;  Laterality: N/AVanessa Kick, MD 09/07/17 1413

## 2017-09-07 NOTE — ED Notes (Signed)
IV attempted x 2 by 2 nurses. No success.

## 2017-09-07 NOTE — ED Notes (Signed)
Lab called; not enough blood collected for CMP and Lipase; RN notified

## 2017-09-07 NOTE — ED Provider Notes (Signed)
[facility] DEPT Provider Note   CSN: [id number] Arrival date & time: 09/07/17  1426     History   Chief Complaint Chief Complaint  Patient presents with  . Abdominal Pain    HPI PRESCIOUS HURLESS is a 47 y.o. female.  HPI Patient presents with right lower quadrant pain since Thursday. Associated with nausea and vomiting. States she's had no bowel movement since Thursday. She's had fever and chills at home. States she's had decreased urination but denies vaginal bleeding or discharge. No previous abdominal surgeries. Past Medical History:  Diagnosis Date  . Allergic rhinitis   . HIV infection ([place]) 2006  . Idiopathic urticaria   . Rosacea     Patient Active Problem List   Diagnosis Date Noted  . Hyperglycemia 12/05/2016  . Encounter for health maintenance examination in adult 12/22/2015  . Vitamin D deficiency 12/22/2015  . Need for Tdap vaccination 12/22/2015  . Alkaline phosphatase elevation 12/22/2015  . Elevated blood pressure reading without diagnosis of hypertension 12/22/2015  . Left knee pain 11/23/2015  . Abnormal dreams 01/06/2014  . Rosacea 04/22/2012  . Acute bronchitis 04/24/2011  . URINARY INCONTINENCE, URGE 10/24/2010  . Hyperlipidemia 11/06/2007  . Obesity 11/06/2007  . URTICARIA, IDIOPATHIC 03/06/2007  . Human immunodeficiency virus (HIV) disease ([place]) 01/24/2007    Past Surgical History:  Procedure Laterality Date  . DILATATION & CURRETTAGE/HYSTEROSCOPY WITH RESECTOCOPE N/A 06/27/2015   Procedure: DILATATION & CURETTAGE/HYSTEROSCOPY WITH RESECTOCOPE;  Surgeon: Servando Salina, MD;  Location: [facility];  Service: Gynecology;  Laterality: N/A;    OB History    No data available       Home Medications    Prior to Admission medications   Medication Sig Start Date End Date Taking? Authorizing Provider  azithromycin (ZITHROMAX) 250 MG tablet 2 tablets day 1, then 1 tablet days 2-4 03/12/17   Tysinger, Camelia Eng, PA-C    chlorpheniramine-HYDROcodone (TUSSIONEX PENNKINETIC ER) 10-8 MG/5ML SUER Take 5 mLs by mouth every 12 (twelve) hours as needed for cough. 03/12/17   Tysinger, Camelia Eng, PA-C  diphenhydrAMINE (BENADRYL) 25 MG tablet Take 25 mg by mouth every 6 (six) hours as needed for itching or allergies. Reported on 02/29/2016    [provider]  GENVOYA 150-150-200-10 MG TABS tablet TAKE ONE TABLET BY MOUTH ONCE DAILY WITH BREAKFAST. 12/12/16   Michel Bickers, MD  ibuprofen (ADVIL,MOTRIN) 800 MG tablet Take 0.5-1 tablets (400-800 mg total) by mouth every 8 (eight) hours as needed for mild pain, moderate pain or cramping. 06/27/15   Servando Salina, MD  mupirocin ointment (BACTROBAN) 2 % Place 1 application into the nose 3 (three) times daily. 02/29/16   Tysinger, Camelia Eng, PA-C    Family History Family History  Problem Relation Age of Onset  . Diabetes Mother   . Diabetes Brother   . Heart disease Paternal Uncle   . Heart disease Maternal Grandmother   . Cancer Maternal Grandmother   . Heart disease Paternal Grandmother     Social History Social History  Substance Use Topics  . Smoking status: Never Smoker  . Smokeless tobacco: Never Used  . Alcohol use 0.0 oz/week     Comment: occassional (special occasions)     Allergies   Amoxicillin; Aspirin; and Sulfamethoxazole-trimethoprim   Review of Systems Review of Systems  Constitutional: Positive for appetite change, chills, fatigue and fever.  Eyes: Negative for visual disturbance.  Respiratory: Positive for cough. Negative for shortness of breath.   Cardiovascular: Negative for chest pain, palpitations  and leg swelling.  Gastrointestinal: Positive for abdominal pain, nausea and vomiting. Negative for constipation and diarrhea.  Genitourinary: Negative for dysuria, flank pain, frequency, hematuria, pelvic pain, vaginal bleeding and vaginal discharge.  Musculoskeletal: Positive for back pain. Negative for neck pain and neck stiffness.   Skin: Negative for rash and wound.  Neurological: Negative for dizziness, weakness, light-headedness, numbness and headaches.  All other systems reviewed and are negative.    Physical Exam Updated Vital Signs BP (!) 100/49   Pulse 96   Temp (!) 101 F (38.3 C) (Oral)   Resp (!) 22   Ht 5\' 3"  (1.6 m)   Wt 115.7 kg (255 lb)   LMP 08/22/2017   SpO2 96%   BMI 45.17 kg/m   Physical Exam  Constitutional: She is oriented to person, place, and time. She appears well-developed and well-nourished.  HENT:  Head: Normocephalic and atraumatic.  Mouth/Throat: Oropharynx is clear and moist. No oropharyngeal exudate.  Eyes: Pupils are equal, round, and reactive to light. EOM are normal.  Neck: Normal range of motion. Neck supple.  Cardiovascular: Normal rate and regular rhythm.  Exam reveals no gallop and no friction rub.   No murmur heard. Pulmonary/Chest: Effort normal and breath sounds normal. No respiratory distress. She has no wheezes. She has no rales. She exhibits no tenderness.  Abdominal: Soft. Bowel sounds are normal. There is tenderness. There is no rebound and no guarding.  Right upper and right lower quadrant tenderness to palpation. No definite rebound or guarding.  Musculoskeletal: Normal range of motion. She exhibits no edema or tenderness.  No CVA tenderness bilaterally. No midline thoracic or lumbar tenderness.  Neurological: She is alert and oriented to person, place, and time.  Moves all extremities without deficit. Sensation fully intact.  Skin: Skin is warm and dry. Capillary refill takes less than 2 seconds. No rash noted. No erythema.  Psychiatric: She has a normal mood and affect. Her behavior is normal.  Nursing note and vitals reviewed.    ED Treatments / Results  Labs (all labs ordered are listed, but only abnormal results are displayed) Labs Reviewed  CBC - Abnormal; Notable for the following:       Result Value   WBC 16.8 (*)    All other components  within normal limits  URINALYSIS, ROUTINE W REFLEX MICROSCOPIC - Abnormal; Notable for the following:    Color, Urine AMBER (*)    APPearance CLOUDY (*)    Hgb urine dipstick SMALL (*)    Ketones, ur 20 (*)    Protein, ur 100 (*)    Bacteria, UA FEW (*)    Squamous Epithelial / LPF TOO NUMEROUS TO COUNT (*)    All other components within normal limits  COMPREHENSIVE METABOLIC PANEL - Abnormal; Notable for the following:    Sodium 132 (*)    CO2 21 (*)    Glucose, Bld 111 (*)    Calcium 8.6 (*)    Albumin 3.4 (*)    All other components within normal limits  LIPASE, BLOOD  PREGNANCY, URINE    EKG  EKG Interpretation None       Radiology Ct Abdomen Pelvis W Contrast  Result Date: 09/07/2017 CLINICAL DATA:  Fever chills abdominal pain, right lower quadrant EXAM: CT ABDOMEN AND PELVIS WITH CONTRAST TECHNIQUE: Multidetector CT imaging of the abdomen and pelvis was performed using the standard protocol following bolus administration of intravenous contrast. CONTRAST:  122mL ISOVUE-300 IOPAMIDOL (ISOVUE-300) INJECTION 61% COMPARISON:  None. FINDINGS:  Lower chest: Lung bases demonstrate streaky atelectasis posteriorly. Possible left anterior lung base subpleural nodule measuring 9 mm. Heart size within normal limits. Hepatobiliary: Probable focal fat infiltration near the falciform ligament. No calcified gallstones. No biliary dilatation Pancreas: Unremarkable. No pancreatic ductal dilatation or surrounding inflammatory changes. Spleen: Normal in size without focal abnormality. Adrenals/Urinary Tract: Adrenal glands are within normal limits. Kidneys demonstrate subcentimeter cortical hypodensities too small to further characterize. No hydronephrosis. Increased density in the left renal pelvis may reflect early excretion of contrast as opposed to a stone. There are however punctate stones present within both kidneys. The bladder is unremarkable Stomach/Bowel: The stomach is nonenlarged. No  dilated small bowel. Enlarged appendix, measuring up to 14 mm and contains increased intraluminal density at the tip and orifice which may reflect small stones. Small gas collection paralleling the proximal lumen may reflect small contained perforation. Moderate fluid and surrounding inflammatory reaction in the right lower quadrant. Vascular/Lymphatic: No significant vascular findings are present. No enlarged abdominal or pelvic lymph nodes. Reproductive: Uterus and bilateral adnexa are unremarkable. Other: Negative for free air.  Tiny fat in the umbilicus. Musculoskeletal: Multiple foci of sclerosis within the spine and pelvic bones. IMPRESSION: 1. Enlarged appendix with probable small stones in the proximal lumen and the tip. Suspected small extraluminal gas collection adjacent to the proximal portion of the appendix with moderate to marked inflammatory response in the right lower quadrant with small fluid. Findings are suspicious for acute appendicitis with contained perforation. No abscess. 2. Possible left anterior lung base subpleural nodule. Recommend nonemergent CT evaluation. 3. Multiple foci of sclerosis within the spine and pelvic bones, the pattern is somewhat suspicious for metastatic disease. Clinical correlation is recommended. Bone scan correlation as indicated. Electronically Signed   By: Donavan Foil M.D.   On: 09/07/2017 21:52    Procedures Procedures (including critical care time)  Medications Ordered in ED Medications  morphine 4 MG/ML injection (  Canceled Entry 09/07/17 2150)  ciprofloxacin (CIPRO) IVPB 400 mg (400 mg Intravenous New Bag/Given 09/07/17 2215)  metroNIDAZOLE (FLAGYL) IVPB 500 mg (500 mg Intravenous New Bag/Given 09/07/17 2216)  sodium chloride 0.9 % bolus 1,000 mL (not administered)  morphine 4 MG/ML injection 4 mg (4 mg Intravenous Given 09/07/17 1921)  ondansetron (ZOFRAN) injection 4 mg (4 mg Intravenous Given 09/07/17 1921)  sodium chloride 0.9 % bolus 1,000 mL (0 mLs  Intravenous Stopped 09/07/17 2129)  iopamidol (ISOVUE-300) 61 % injection (100 mLs Intravenous Contrast Given 09/07/17 2101)  morphine 4 MG/ML injection 4 mg (4 mg Intravenous Given 09/07/17 2135)     Initial Impression / Assessment and Plan / ED Course  I have reviewed the triage vital signs and the nursing notes.  Pertinent labs & imaging results that were available during my care of the patient were reviewed by me and considered in my medical decision making (see chart for details).    Patient has history of HIV with last viral load undetectable. CT with evidence of appendicitis and contained perforation. No abscess appreciated. Initiated IV fluids and given IV Cipro and Flagyl. Discussed with Dr. Windle Guard. We'll see patient in the emergency department.  Final Clinical Impressions(s) / ED Diagnoses   Final diagnoses:  Appendicitis with perforation    New Prescriptions New Prescriptions   No medications on file     Julianne Rice, MD 09/07/17 2228

## 2017-09-08 ENCOUNTER — Encounter (HOSPITAL_COMMUNITY): Payer: Self-pay | Admitting: Surgery

## 2017-09-08 DIAGNOSIS — I96 Gangrene, not elsewhere classified: Secondary | ICD-10-CM | POA: Diagnosis present

## 2017-09-08 DIAGNOSIS — Z6841 Body Mass Index (BMI) 40.0 and over, adult: Secondary | ICD-10-CM | POA: Diagnosis not present

## 2017-09-08 DIAGNOSIS — K5641 Fecal impaction: Secondary | ICD-10-CM | POA: Diagnosis present

## 2017-09-08 DIAGNOSIS — K352 Acute appendicitis with generalized peritonitis: Secondary | ICD-10-CM | POA: Diagnosis present

## 2017-09-08 DIAGNOSIS — K353 Acute appendicitis with localized peritonitis: Secondary | ICD-10-CM | POA: Diagnosis present

## 2017-09-08 DIAGNOSIS — K219 Gastro-esophageal reflux disease without esophagitis: Secondary | ICD-10-CM | POA: Diagnosis present

## 2017-09-08 DIAGNOSIS — Z8249 Family history of ischemic heart disease and other diseases of the circulatory system: Secondary | ICD-10-CM | POA: Diagnosis not present

## 2017-09-08 DIAGNOSIS — Z21 Asymptomatic human immunodeficiency virus [HIV] infection status: Secondary | ICD-10-CM | POA: Diagnosis present

## 2017-09-08 DIAGNOSIS — Z833 Family history of diabetes mellitus: Secondary | ICD-10-CM | POA: Diagnosis not present

## 2017-09-08 LAB — BASIC METABOLIC PANEL
Anion gap: 9 (ref 5–15)
BUN: 5 mg/dL — AB (ref 6–20)
CALCIUM: 8.1 mg/dL — AB (ref 8.9–10.3)
CHLORIDE: 105 mmol/L (ref 101–111)
CO2: 20 mmol/L — ABNORMAL LOW (ref 22–32)
CREATININE: 0.95 mg/dL (ref 0.44–1.00)
GFR calc Af Amer: >60 mL/min (ref >60)
GFR calc non Af Amer: >60 mL/min (ref >60)
Glucose, Bld: 127 mg/dL — ABNORMAL HIGH (ref 65–99)
Potassium: 3.9 mmol/L (ref 3.5–5.1)
Sodium: 134 mmol/L — ABNORMAL LOW (ref 135–145)

## 2017-09-08 LAB — CBC
HCT: 39.1 % (ref 36.0–46.0)
Hemoglobin: 12.4 g/dL (ref 12.0–15.0)
MCH: 30.3 pg (ref 26.0–34.0)
MCHC: 31.7 g/dL (ref 30.0–36.0)
MCV: 95.6 fL (ref 78.0–100.0)
PLATELETS: 201 10*3/uL (ref 150–400)
RBC: 4.09 MIL/uL (ref 3.87–5.11)
RDW: 14.2 % (ref 11.5–15.5)
WBC: 9.3 10*3/uL (ref 4.0–10.5)

## 2017-09-08 LAB — GENTAMICIN LEVEL, RANDOM: Gentamicin Rm: 1.1 ug/mL

## 2017-09-08 MED ORDER — 0.9 % SODIUM CHLORIDE (POUR BTL) OPTIME
TOPICAL | Status: DC | PRN
  Administered 2017-09-08: 1000 mL

## 2017-09-08 MED ORDER — METHOCARBAMOL 500 MG PO TABS
500.0000 mg | ORAL_TABLET | Freq: Four times a day (QID) | ORAL | Status: DC | PRN
  Administered 2017-09-08 (×2): 500 mg via ORAL
  Filled 2017-09-08 (×2): qty 1

## 2017-09-08 MED ORDER — ELVITEG-COBIC-EMTRICIT-TENOFAF 150-150-200-10 MG PO TABS
1.0000 | ORAL_TABLET | Freq: Every day | ORAL | Status: DC
  Administered 2017-09-08 – 2017-09-11 (×4): 1 via ORAL
  Filled 2017-09-08 (×4): qty 1

## 2017-09-08 MED ORDER — DEXAMETHASONE SODIUM PHOSPHATE 10 MG/ML IJ SOLN
INTRAMUSCULAR | Status: AC
  Filled 2017-09-08: qty 1

## 2017-09-08 MED ORDER — ROCURONIUM BROMIDE 100 MG/10ML IV SOLN
INTRAVENOUS | Status: DC | PRN
  Administered 2017-09-08: 30 mg via INTRAVENOUS
  Administered 2017-09-08: 20 mg via INTRAVENOUS

## 2017-09-08 MED ORDER — SODIUM CHLORIDE 0.9 % IV SOLN
INTRAVENOUS | Status: DC
  Administered 2017-09-08: 1 mL via INTRAVENOUS
  Administered 2017-09-08: 03:00:00 via INTRAVENOUS
  Administered 2017-09-09: 1 mL via INTRAVENOUS
  Administered 2017-09-09 – 2017-09-10 (×2): via INTRAVENOUS

## 2017-09-08 MED ORDER — ENOXAPARIN SODIUM 40 MG/0.4ML ~~LOC~~ SOLN
40.0000 mg | SUBCUTANEOUS | Status: DC
  Administered 2017-09-08 – 2017-09-11 (×4): 40 mg via SUBCUTANEOUS
  Filled 2017-09-08 (×4): qty 0.4

## 2017-09-08 MED ORDER — METOPROLOL TARTRATE 5 MG/5ML IV SOLN: 5.0000 mg | Freq: Four times a day (QID) | INTRAVENOUS | Status: DC | PRN

## 2017-09-08 MED ORDER — ONDANSETRON HCL 4 MG/2ML IJ SOLN
4.0000 mg | Freq: Four times a day (QID) | INTRAMUSCULAR | Status: DC | PRN
  Administered 2017-09-09: 4 mg via INTRAVENOUS
  Filled 2017-09-08: qty 2

## 2017-09-08 MED ORDER — HYDRALAZINE HCL 20 MG/ML IJ SOLN: 10.0000 mg | INTRAMUSCULAR | Status: DC | PRN

## 2017-09-08 MED ORDER — SUCCINYLCHOLINE CHLORIDE 20 MG/ML IJ SOLN
INTRAMUSCULAR | Status: DC | PRN
  Administered 2017-09-07: 110 mg via INTRAVENOUS

## 2017-09-08 MED ORDER — ONDANSETRON HCL 4 MG/2ML IJ SOLN
INTRAMUSCULAR | Status: AC
  Filled 2017-09-08: qty 4

## 2017-09-08 MED ORDER — HYDROMORPHONE HCL 1 MG/ML IJ SOLN
INTRAMUSCULAR | Status: AC
  Administered 2017-09-08: 07:00:00
  Filled 2017-09-08: qty 1

## 2017-09-08 MED ORDER — LIDOCAINE 2% (20 MG/ML) 5 ML SYRINGE
INTRAMUSCULAR | Status: AC
  Filled 2017-09-08: qty 5

## 2017-09-08 MED ORDER — CIPROFLOXACIN IN D5W 400 MG/200ML IV SOLN
400.0000 mg | Freq: Two times a day (BID) | INTRAVENOUS | Status: DC
  Administered 2017-09-08 – 2017-09-11 (×7): 400 mg via INTRAVENOUS
  Filled 2017-09-08 (×8): qty 200

## 2017-09-08 MED ORDER — GLYCOPYRROLATE 0.2 MG/ML IJ SOLN
INTRAMUSCULAR | Status: DC | PRN
  Administered 2017-09-07: 0.2 mg via INTRAVENOUS

## 2017-09-08 MED ORDER — ROCURONIUM BROMIDE 10 MG/ML (PF) SYRINGE
PREFILLED_SYRINGE | INTRAVENOUS | Status: AC
  Filled 2017-09-08: qty 5

## 2017-09-08 MED ORDER — MIDAZOLAM HCL 5 MG/5ML IJ SOLN
INTRAMUSCULAR | Status: DC | PRN
  Administered 2017-09-07: 2 mg via INTRAVENOUS

## 2017-09-08 MED ORDER — SUGAMMADEX SODIUM 200 MG/2ML IV SOLN
INTRAVENOUS | Status: DC | PRN
  Administered 2017-09-08: 200 mg via INTRAVENOUS

## 2017-09-08 MED ORDER — DOCUSATE SODIUM 100 MG PO CAPS
100.0000 mg | ORAL_CAPSULE | Freq: Two times a day (BID) | ORAL | Status: DC
  Administered 2017-09-08 – 2017-09-11 (×8): 100 mg via ORAL
  Filled 2017-09-08 (×8): qty 1

## 2017-09-08 MED ORDER — OXYCODONE HCL 5 MG PO TABS: 5.0000 mg | ORAL_TABLET | Freq: Once | ORAL | Status: DC | PRN

## 2017-09-08 MED ORDER — SUCCINYLCHOLINE CHLORIDE 200 MG/10ML IV SOSY
PREFILLED_SYRINGE | INTRAVENOUS | Status: AC
  Filled 2017-09-08: qty 10

## 2017-09-08 MED ORDER — GENTAMICIN SULFATE 40 MG/ML IJ SOLN
5.0000 mg/kg | INTRAVENOUS | Status: DC
  Administered 2017-09-08 – 2017-09-09 (×2): 390 mg via INTRAVENOUS
  Filled 2017-09-08 (×2): qty 9.75

## 2017-09-08 MED ORDER — HYDROMORPHONE HCL 1 MG/ML IJ SOLN
0.5000 mg | INTRAMUSCULAR | Status: DC | PRN
Start: 2017-09-08 — End: 2017-09-10
  Administered 2017-09-09: 0.5 mg via INTRAVENOUS
  Filled 2017-09-08 (×2): qty 1

## 2017-09-08 MED ORDER — METRONIDAZOLE IN NACL 5-0.79 MG/ML-% IV SOLN
500.0000 mg | Freq: Three times a day (TID) | INTRAVENOUS | Status: DC
  Administered 2017-09-08 – 2017-09-11 (×11): 500 mg via INTRAVENOUS
  Filled 2017-09-08 (×12): qty 100

## 2017-09-08 MED ORDER — HYDROMORPHONE HCL 1 MG/ML IJ SOLN
0.2500 mg | INTRAMUSCULAR | Status: DC | PRN
  Administered 2017-09-08 (×2): 0.5 mg via INTRAVENOUS

## 2017-09-08 MED ORDER — DIPHENHYDRAMINE HCL 25 MG PO CAPS
25.0000 mg | ORAL_CAPSULE | Freq: Four times a day (QID) | ORAL | Status: DC | PRN
  Administered 2017-09-11 (×2): 25 mg via ORAL
  Filled 2017-09-08 (×2): qty 1

## 2017-09-08 MED ORDER — IBUPROFEN 400 MG PO TABS: 400.0000 mg | ORAL_TABLET | Freq: Three times a day (TID) | ORAL | Status: DC | PRN

## 2017-09-08 MED ORDER — SUFENTANIL CITRATE 50 MCG/ML IV SOLN
INTRAVENOUS | Status: DC | PRN
  Administered 2017-09-07: 15 ug via INTRAVENOUS
  Administered 2017-09-08 (×2): 5 ug via INTRAVENOUS
  Administered 2017-09-08: 10 ug via INTRAVENOUS

## 2017-09-08 MED ORDER — ACETAMINOPHEN 325 MG PO TABS
650.0000 mg | ORAL_TABLET | Freq: Four times a day (QID) | ORAL | Status: DC | PRN
  Administered 2017-09-08: 650 mg via ORAL
  Filled 2017-09-08: qty 2

## 2017-09-08 MED ORDER — GENTAMICIN SULFATE 40 MG/ML IJ SOLN
5.0000 mg/kg | INTRAVENOUS | Status: DC
  Filled 2017-09-08: qty 9.75

## 2017-09-08 MED ORDER — ACETAMINOPHEN 650 MG RE SUPP: 650.0000 mg | Freq: Four times a day (QID) | RECTAL | Status: DC | PRN

## 2017-09-08 MED ORDER — SODIUM CHLORIDE 0.9 % IR SOLN
Status: DC | PRN
  Administered 2017-09-08: 1000 mL

## 2017-09-08 MED ORDER — PROMETHAZINE HCL 25 MG/ML IJ SOLN: 6.2500 mg | INTRAMUSCULAR | Status: DC | PRN

## 2017-09-08 MED ORDER — BUPIVACAINE-EPINEPHRINE 0.25% -1:200000 IJ SOLN
INTRAMUSCULAR | Status: DC | PRN
  Administered 2017-09-08: 10 mL

## 2017-09-08 MED ORDER — ONDANSETRON HCL 4 MG/2ML IJ SOLN
INTRAMUSCULAR | Status: DC | PRN
  Administered 2017-09-08: 4 mg via INTRAVENOUS

## 2017-09-08 MED ORDER — PROPOFOL 10 MG/ML IV BOLUS
INTRAVENOUS | Status: DC | PRN
  Administered 2017-09-07: 160 mg via INTRAVENOUS
  Administered 2017-09-08: 40 mg via INTRAVENOUS

## 2017-09-08 MED ORDER — OXYCODONE HCL 5 MG PO TABS
5.0000 mg | ORAL_TABLET | ORAL | Status: DC | PRN
  Administered 2017-09-08 – 2017-09-10 (×8): 10 mg via ORAL
  Filled 2017-09-08 (×8): qty 2

## 2017-09-08 MED ORDER — PROPOFOL 10 MG/ML IV BOLUS
INTRAVENOUS | Status: AC
  Filled 2017-09-08: qty 20

## 2017-09-08 MED ORDER — DEXAMETHASONE SODIUM PHOSPHATE 10 MG/ML IJ SOLN
INTRAMUSCULAR | Status: DC | PRN
  Administered 2017-09-08: 10 mg via INTRAVENOUS

## 2017-09-08 MED ORDER — LIDOCAINE HCL (CARDIAC) 20 MG/ML IV SOLN
INTRAVENOUS | Status: DC | PRN
  Administered 2017-09-07: 100 mg via INTRATRACHEAL

## 2017-09-08 MED ORDER — OXYCODONE HCL 5 MG/5ML PO SOLN: 5.0000 mg | Freq: Once | ORAL | Status: DC | PRN

## 2017-09-08 MED ORDER — SUGAMMADEX SODIUM 200 MG/2ML IV SOLN
INTRAVENOUS | Status: AC
  Filled 2017-09-08: qty 2

## 2017-09-08 MED ORDER — SODIUM CHLORIDE 0.9 % IJ SOLN
INTRAMUSCULAR | Status: AC
  Filled 2017-09-08: qty 10

## 2017-09-08 MED ORDER — ONDANSETRON 4 MG PO TBDP
4.0000 mg | ORAL_TABLET | Freq: Four times a day (QID) | ORAL | Status: DC | PRN
Start: 2017-09-08 — End: 2017-09-11
  Filled 2017-09-08: qty 1

## 2017-09-08 NOTE — Transfer of Care (Signed)
Immediate Anesthesia Transfer of Care Note  Patient: Margaret Porter  Procedure(s) Performed: Procedure(s): APPENDECTOMY LAPAROSCOPIC (N/A)  Patient Location: PACU  Anesthesia Type:General  Level of Consciousness: sedated, drowsy, patient cooperative and responds to stimulation  Airway & Oxygen Therapy: Patient Spontanous Breathing and Patient connected to nasal cannula oxygen  Post-op Assessment: Report given to RN, Post -op Vital signs reviewed and stable and Patient moving all extremities X 4  Post vital signs: Reviewed and stable  Last Vitals:  Vitals:   09/07/17 2200 09/07/17 2220  BP: (!) 97/46 (!) 100/49  Pulse: 100 96  Resp: 20 (!) 22  Temp:    SpO2: 93% 96%    Last Pain:  Vitals:   09/07/17 2228  TempSrc:   PainSc: 3          Complications: No apparent anesthesia complications

## 2017-09-08 NOTE — Anesthesia Postprocedure Evaluation (Signed)
Anesthesia Post Note  Patient: Margaret Porter  Procedure(s) Performed: Procedure(s) (LRB): APPENDECTOMY LAPAROSCOPIC (N/A)     Patient location during evaluation: PACU Anesthesia Type: General Level of consciousness: awake Pain management: pain level controlled Vital Signs Assessment: post-procedure vital signs reviewed and stable Respiratory status: spontaneous breathing, nonlabored ventilation, respiratory function stable and patient connected to nasal cannula oxygen Cardiovascular status: blood pressure returned to baseline and stable Postop Assessment: no signs of nausea or vomiting Anesthetic complications: no    Last Vitals:  Vitals:   09/08/17 0323 09/08/17 0359  BP:  129/75  Pulse: 98 94  Resp: 14 15  Temp: 36.7 C 37.9 C  SpO2: 96% 95%    Last Pain:  Vitals:   09/08/17 0359  TempSrc: Oral  PainSc:                  Ryan P Ellender

## 2017-09-08 NOTE — Progress Notes (Addendum)
Pharmacy Antibiotic Note  Margaret Porter is a 47 y.o. female admitted on 09/07/2017 with abdominal pain suspicious for acute appendicitis. Patient is now s/p surgery for laparoscopic appendectomy. Per op notes, patient with ruputured appendix, peritoneal abscess, localized peritonitis, and gangrene. Pharmacy has been consulted for gentamicin dosing. Patient is also receiving ciprofloxacin and metronidazole per MD.   Tmax is 101 and WBC elevated at 16.8. SCr is 0.99 for estimated nCrCl ~ 75-80 mL/min.  Will dose gentamicin based on adjusted body weight.   Plan: Gentamicin 5mg /kg IV q24hr (extended interval dosing) Ciprofloxacin 400 mg IV q12hr per MD Metronidazole 500 mg IV q8hr per MD  Gentamicin levels as needed  Monitor renal function, clinical picture, and culture data F/u length of therapy   Height: 5\' 3"  (160 cm) Weight: 255 lb (115.7 kg) IBW/kg (Calculated) : 52.4  Temp (24hrs), Avg:100.2 F (37.9 C), Min:99.2 F (37.3 C), Max:101 F (38.3 C)   Recent Labs Lab 09/07/17 1440 09/07/17 1638  WBC 16.8*  --   CREATININE  --  0.99    Estimated Creatinine Clearance: 85.2 mL/min (by C-G formula based on SCr of 0.99 mg/dL).    Allergies  Allergen Reactions  . Amoxicillin Rash  . Aspirin Anxiety  . Sulfamethoxazole-Trimethoprim Rash    Antimicrobials this admission: 9/9 Gent >>  9/9 Cipro >>  9/9 Flagyl >>   Microbiology results: none at this time  Argie Ramming, PharmD Clinical Pharmacist 09/08/17 2:29 AM

## 2017-09-08 NOTE — Op Note (Addendum)
Operative Report  Margaret Porter 47 y.o. female  [id number]  [id number]  09/08/2017  Surgeon: Clovis Riley MD  Assistant: OR Staff  Procedure performed: Laparoscopic Appendectomy  Preop diagnosis: Acute appendicitis - with perforation   Post-op diagnosis/intraop findings: Acute appendicitis - with ruptured appendix - with peritoneal abscess - with localized peritonitis  - with gangrene and multiple fecaliths  Specimens: appendix  EBL: minimal  Complications: none  Description of procedure: After obtaining informed consent the patient was brought to the operating room. Antibiotics and subcutaneous heparin were administered. SCD's were applied. General endotracheal anesthesia was initiated and a formal time-out was performed. The abdomen was prepped and draped in the usual sterile fashion and the abdomen was entered using an infraumbilical Veress needle and insufflated to 15 mmHg. A 5 mm trocar and camera were then introduced, the abdomen was inspected and there is no evidence of injury from our entry. A suprapubic 5 mm trocar and a left lower quadrant 12 mm trocar were introduced under direct visualization following infiltration with local. The patient was then placed in Trendelenburg and rotated to the left and the small bowel was reflected cephalad. The terminal ileum was densely adherent to the abdominal wall obscuring the appendix and cecum. This was carefully dissected free with blunt and sharp dissection. The appendix was entirely retroperitoneal and encased in a thick inflammatory rind. Again blunt and sharp dissection as well as the harmonic where indicated were employed to define the appendix, which was noted to be perforated and disrupted with spillage of pus and multiple small fecaliths about 1.5cm from the base. The harmonic was used to divide the mesoappendix away from the remaining appendix and fortunately there was sufficient viable base of the appendix to take a  stapler. Great care was taken to ensure no injury to surrounding retroperitoneal structures, cecum or terminal ileum during the dissection.  A white load linear cutting stapler was then used to transect the appendix just as it joined the cecum. Hemostasis was ensured. The staple line appeared viable, intact and hemostatic. The appendix, which was in 2 parts, and all fecaliths were gathered in an Endo Catch bag and removed through our 12 mm trocar site. The right lower quadrant and abdomen were copiously irrigated and the effluent was clear. The omentum was brought down and situated over the staple line. A 24fr round blake drain was introduced through the 17mm trocar site and directed to lie near the staple line and the abscess cavity/ rind as well as down in the pelvis. The abdomen was desufflated and all trocars removed. The drain was secured at the skin with a 2-0 nylon and placed to bulb suction. The skin incisions were closed with running subcuticular monocryl benzoin and steri strips. The patient was awakened, extubated and transported to the recovery room in stable condition.   All counts were correct at the completion of the case.

## 2017-09-08 NOTE — Progress Notes (Signed)
Pharmacy Antibiotic Note  Margaret Porter is a 47 y.o. female admitted on 09/07/2017 with abdominal pain suspicious for acute appendicitis. Patient is now s/p surgery for laparoscopic appendectomy. Per op notes, patient with ruputured appendix, peritoneal abscess, localized peritonitis, and gangrene. Pharmacy has been consulted for gentamicin dosing. Patient is also receiving ciprofloxacin and metronidazole per MD.   Tmax 101 and WBC trending down from 16.8 to 9.3.   Gentamicin random level, drawn at 11 hours after dose given, is therapeutic at 1.1 mcg/mL per the [facility] Nomogram. SCr is stable.   Gentamicin is dosed based on adjusted body weight.   Plan: Continue gentamicin 5mg /kg IV q24hr (extended interval dosing) Ciprofloxacin 400 mg IV q12hr per MD Metronidazole 500 mg IV q8hr per MD  Gentamicin levels as needed  Monitor renal function, clinical picture, and culture data F/u length of therapy   Height: 5\' 3"  (160 cm) Weight: 253 lb 14.8 oz (115.2 kg) IBW/kg (Calculated) : 52.4  Temp (24hrs), Avg:98.8 F (37.1 C), Min:97.7 F (36.5 C), Max:100.2 F (37.9 C)   Recent Labs Lab 09/07/17 1440 09/07/17 1638 09/08/17 0449 09/08/17 1546  WBC 16.8*  --  9.3  --   CREATININE  --  0.99 0.95  --   GENTRANDOM  --   --   --  1.1    Estimated Creatinine Clearance: 88.6 mL/min (by C-G formula based on SCr of 0.95 mg/dL).    Allergies  Allergen Reactions  . Amoxicillin Rash  . Aspirin Anxiety  . Sulfamethoxazole-Trimethoprim Rash    Antimicrobials this admission: 9/9 Gent >>  9/9 Cipro >>  9/9 Flagyl >>   Microbiology results: none at this time  Argie Ramming, PharmD Clinical Pharmacist 09/08/17 9:55 PM

## 2017-09-08 NOTE — Progress Notes (Signed)
1 Day Post-Op   Subjective/Chief Complaint: Comfortable Denies nausea   Objective: Vital signs in last 24 hours: Temp:  [97.7 F (36.5 C)-101 F (38.3 C)] 100.2 F (37.9 C) (09/09 0359) Pulse Rate:  [77-149] 94 (09/09 0359) Resp:  [13-28] 15 (09/09 0359) BP: (97-133)/(45-82) 129/75 (09/09 0359) SpO2:  [90 %-100 %] 95 % (09/09 0359) Weight:  [115.2 kg (253 lb 14.8 oz)-115.7 kg (255 lb)] 115.2 kg (253 lb 14.8 oz) (09/09 0359)    Intake/Output from previous day: 09/08 0701 - 09/09 0700 In: 2850 [I.V.:1700; IV Piggyback:1000] Out: 6644 [Urine:1100; Drains:265; Blood:50] Intake/Output this shift: No intake/output data recorded.  Exam: Looks comfortable Abdomen soft, non distended, drain serosanguinous   Lab Results:   Recent Labs  09/07/17 1440 09/08/17 0449  WBC 16.8* 9.3  HGB 13.7 12.4  HCT 41.3 39.1  PLT 243 201   BMET  Recent Labs  09/07/17 1638 09/08/17 0449  NA 132* 134*  K 4.0 3.9  CL 102 105  CO2 21* 20*  GLUCOSE 111* 127*  BUN 6 5*  CREATININE 0.99 0.95  CALCIUM 8.6* 8.1*   PT/INR No results for input(s): LABPROT, INR in the last 72 hours. ABG No results for input(s): PHART, HCO3 in the last 72 hours.  Invalid input(s): PCO2, PO2  Studies/Results: Ct Abdomen Pelvis W Contrast  Result Date: 09/07/2017 CLINICAL DATA:  Fever chills abdominal pain, right lower quadrant EXAM: CT ABDOMEN AND PELVIS WITH CONTRAST TECHNIQUE: Multidetector CT imaging of the abdomen and pelvis was performed using the standard protocol following bolus administration of intravenous contrast. CONTRAST:  157mL ISOVUE-300 IOPAMIDOL (ISOVUE-300) INJECTION 61% COMPARISON:  None. FINDINGS: Lower chest: Lung bases demonstrate streaky atelectasis posteriorly. Possible left anterior lung base subpleural nodule measuring 9 mm. Heart size within normal limits. Hepatobiliary: Probable focal fat infiltration near the falciform ligament. No calcified gallstones. No biliary dilatation  Pancreas: Unremarkable. No pancreatic ductal dilatation or surrounding inflammatory changes. Spleen: Normal in size without focal abnormality. Adrenals/Urinary Tract: Adrenal glands are within normal limits. Kidneys demonstrate subcentimeter cortical hypodensities too small to further characterize. No hydronephrosis. Increased density in the left renal pelvis may reflect early excretion of contrast as opposed to a stone. There are however punctate stones present within both kidneys. The bladder is unremarkable Stomach/Bowel: The stomach is nonenlarged. No dilated small bowel. Enlarged appendix, measuring up to 14 mm and contains increased intraluminal density at the tip and orifice which may reflect small stones. Small gas collection paralleling the proximal lumen may reflect small contained perforation. Moderate fluid and surrounding inflammatory reaction in the right lower quadrant. Vascular/Lymphatic: No significant vascular findings are present. No enlarged abdominal or pelvic lymph nodes. Reproductive: Uterus and bilateral adnexa are unremarkable. Other: Negative for free air.  Tiny fat in the umbilicus. Musculoskeletal: Multiple foci of sclerosis within the spine and pelvic bones. IMPRESSION: 1. Enlarged appendix with probable small stones in the proximal lumen and the tip. Suspected small extraluminal gas collection adjacent to the proximal portion of the appendix with moderate to marked inflammatory response in the right lower quadrant with small fluid. Findings are suspicious for acute appendicitis with contained perforation. No abscess. 2. Possible left anterior lung base subpleural nodule. Recommend nonemergent CT evaluation. 3. Multiple foci of sclerosis within the spine and pelvic bones, the pattern is somewhat suspicious for metastatic disease. Clinical correlation is recommended. Bone scan correlation as indicated. Electronically Signed   By: Donavan Foil M.D.   On: 09/07/2017 21:52     Anti-infectives: Anti-infectives  Start     Dose/Rate Route Frequency Ordered Stop   09/08/17 1000  ciprofloxacin (CIPRO) IVPB 400 mg     400 mg 200 mL/hr over 60 Minutes Intravenous Every 12 hours 09/08/17 0213     09/08/17 0800  elvitegravir-cobicistat-emtricitabine-tenofovir (GENVOYA) 150-150-200-10 MG tablet 1 tablet     1 tablet Oral Daily with breakfast 09/08/17 0344     09/08/17 0600  metroNIDAZOLE (FLAGYL) IVPB 500 mg     500 mg 100 mL/hr over 60 Minutes Intravenous Every 8 hours 09/08/17 0213     09/08/17 0500  gentamicin (GARAMYCIN) 390 mg in dextrose 5 % 100 mL IVPB     5 mg/kg  77.7 kg (Adjusted) 109.8 mL/hr over 60 Minutes Intravenous Every 24 hours 09/08/17 0340     09/08/17 0400  gentamicin (GARAMYCIN) 390 mg in dextrose 5 % 100 mL IVPB  Status:  Discontinued     5 mg/kg  77.7 kg (Adjusted) 109.8 mL/hr over 60 Minutes Intravenous Every 24 hours 09/08/17 0246 09/08/17 0340   09/07/17 2215  ciprofloxacin (CIPRO) IVPB 400 mg     400 mg 200 mL/hr over 60 Minutes Intravenous  Once 09/07/17 2204 09/07/17 2315   09/07/17 2215  metroNIDAZOLE (FLAGYL) IVPB 500 mg     500 mg 100 mL/hr over 60 Minutes Intravenous  Once 09/07/17 2204 09/07/17 2316      Assessment/Plan: s/p Procedure(s): APPENDECTOMY LAPAROSCOPIC (N/A)  Continue drain, IV antibiotics.  WBC now normal Start clear liquids Decrease IVF  LOS: 0 days    Margaret Porter A 09/08/2017

## 2017-09-08 NOTE — Progress Notes (Signed)
Pt unable to void, complained of pain and pressure to bladder. Bladder scan shows 475ml. In and Out done. Urine output was 979ml. Will monitor patient.

## 2017-09-09 DIAGNOSIS — K352 Acute appendicitis with generalized peritonitis: Secondary | ICD-10-CM

## 2017-09-09 DIAGNOSIS — Z21 Asymptomatic human immunodeficiency virus [HIV] infection status: Secondary | ICD-10-CM

## 2017-09-09 MED ORDER — ACETAMINOPHEN 500 MG PO TABS
1000.0000 mg | ORAL_TABLET | Freq: Three times a day (TID) | ORAL | Status: DC
  Administered 2017-09-09 – 2017-09-11 (×8): 1000 mg via ORAL
  Filled 2017-09-09 (×8): qty 2

## 2017-09-09 MED ORDER — ALUM & MAG HYDROXIDE-SIMETH 200-200-20 MG/5ML PO SUSP: 30.0000 mL | Freq: Four times a day (QID) | ORAL | Status: DC | PRN

## 2017-09-09 MED ORDER — IBUPROFEN 600 MG PO TABS: 600.0000 mg | ORAL_TABLET | Freq: Four times a day (QID) | ORAL | Status: DC | PRN

## 2017-09-09 MED ORDER — METHOCARBAMOL 750 MG PO TABS
750.0000 mg | ORAL_TABLET | Freq: Three times a day (TID) | ORAL | Status: DC
  Administered 2017-09-09 – 2017-09-11 (×8): 750 mg via ORAL
  Filled 2017-09-09 (×8): qty 1

## 2017-09-09 MED ORDER — FAMOTIDINE 20 MG PO TABS
20.0000 mg | ORAL_TABLET | Freq: Two times a day (BID) | ORAL | Status: DC
  Administered 2017-09-09 – 2017-09-11 (×5): 20 mg via ORAL
  Filled 2017-09-09 (×5): qty 1

## 2017-09-09 NOTE — Consult Note (Signed)
[name]  Date of Admission:  09/07/2017  Date of Consult:  09/09/2017  Reason for Consult: HIV+, perforated appendicitis Referring Physician: Jennings/White  Impression/Recommendation HIV+ Perforated Appendicitis Morbid obesity  Would stop gent Continue cipro-flagyl.  Can transition to po soon.  Continue genvoya She states that no one knows that she is HIV+  Thank you so much for this interesting consult,   Bobby Rumpf (pager) [phone] [email or web address]  Margaret Porter is an 47 y.o. female.  HPI: 3 y F with hx of HIV+ (since 2003, on genvoya, followed in research unit and by Dr Megan Salon), comes to hospital on 9-8 with 2 days of abd pain. She was found to have perforated appendix. She was started on gent/cipro/flagyl.  She underwent laparoscopic appendectomy on 9-9 with peritoneal abscess and peritonitis also found.  She is up ambulating currently.   HIV 1 RNA Quant (copies/mL)  Date Value  07/24/2010 39  03/01/2010 39  08/04/2009 39   HIV-1 RNA Viral Load (no units)  Date Value  05/22/2017 <40  04/04/2017 <40  10/29/2016 <40   CD4 (no units)  Date Value  11/08/2015 550  02/08/2015 443  12/21/2013 475     Past Medical History:  Diagnosis Date  . Allergic rhinitis   . HIV infection ([place]) 2006  . Idiopathic urticaria   . Rosacea     Past Surgical History:  Procedure Laterality Date  . DILATATION & CURRETTAGE/HYSTEROSCOPY WITH RESECTOCOPE N/A 06/27/2015   Procedure: DILATATION & CURETTAGE/HYSTEROSCOPY WITH RESECTOCOPE;  Surgeon: Servando Salina, MD;  Location: [facility];  Service: Gynecology;  Laterality: N/A;  . LAPAROSCOPIC APPENDECTOMY N/A 09/07/2017   Procedure: APPENDECTOMY LAPAROSCOPIC;  Surgeon: Clovis Riley, MD;  Location: [facility];  Service: General;  Laterality: N/A;     Allergies  Allergen Reactions  . Amoxicillin Rash  . Aspirin Anxiety  . Sulfamethoxazole-Trimethoprim Rash     Medications:  Scheduled: . acetaminophen  1,000 mg Oral Q8H  . docusate sodium  100 mg Oral BID  . elvitegravir-cobicistat-emtricitabine-tenofovir  1 tablet Oral Q breakfast  . enoxaparin (LOVENOX) injection  40 mg Subcutaneous Q24H  . famotidine  20 mg Oral BID  . methocarbamol  750 mg Oral Q8H    Abtx:  Anti-infectives    Start     Dose/Rate Route Frequency Ordered Stop   09/08/17 1000  ciprofloxacin (CIPRO) IVPB 400 mg     400 mg 200 mL/hr over 60 Minutes Intravenous Every 12 hours 09/08/17 0213     09/08/17 0800  elvitegravir-cobicistat-emtricitabine-tenofovir (GENVOYA) 150-150-200-10 MG tablet 1 tablet     1 tablet Oral Daily with breakfast 09/08/17 0344     09/08/17 0600  metroNIDAZOLE (FLAGYL) IVPB 500 mg     500 mg 100 mL/hr over 60 Minutes Intravenous Every 8 hours 09/08/17 0213     09/08/17 0500  gentamicin (GARAMYCIN) 390 mg in dextrose 5 % 100 mL IVPB     5 mg/kg  77.7 kg (Adjusted) 109.8 mL/hr over 60 Minutes Intravenous Every 24 hours 09/08/17 0340     09/08/17 0400  gentamicin (GARAMYCIN) 390 mg in dextrose 5 % 100 mL IVPB  Status:  Discontinued     5 mg/kg  77.7 kg (Adjusted) 109.8 mL/hr over 60 Minutes Intravenous Every 24 hours 09/08/17 0246 09/08/17 0340   09/07/17 2215  ciprofloxacin (CIPRO) IVPB 400 mg     400 mg 200 mL/hr over 60 Minutes Intravenous  Once 09/07/17 2204 09/07/17 2315   09/07/17  2215  metroNIDAZOLE (FLAGYL) IVPB 500 mg     500 mg 100 mL/hr over 60 Minutes Intravenous  Once 09/07/17 2204 09/07/17 2316      Total days of antibiotics: 1 gent/cipro/flagyl          Social History:  reports that she has never smoked. She has never used smokeless tobacco. She reports that she drinks alcohol. She reports that she does not use drugs.  Family History  Problem Relation Age of Onset  . Diabetes Mother   . Diabetes Brother   . Heart disease Paternal Uncle   . Heart disease Maternal Grandmother   . Cancer Maternal Grandmother   . Heart  disease Paternal Grandmother     History obtained from chart review and the patient General ROS: + emesis, normal BM, normal urine (darker), no fever or chills.  Please see HPI. 12 point ROS o/w (-)  Blood pressure 122/75, pulse 86, temperature 97.8 F (36.6 C), temperature source Oral, resp. rate 18, height '5\' 3"'$  (1.6 m), weight 115.2 kg (253 lb 14.8 oz), last menstrual period 08/22/2017, SpO2 96 %. General appearance: alert, cooperative and no distress Eyes: negative findings: conjunctivae and sclerae normal and pupils equal, round, reactive to light and accomodation Throat: lips, mucosa, and tongue normal; teeth and gums normal Neck: no adenopathy and supple, symmetrical, trachea midline Lungs: clear to auscultation bilaterally Heart: regular rate and rhythm Abdomen: normal findings: bowel sounds normal and soft, non-tender and abnormal findings:  drain in LLQ.  Extremities: edema none   Results for orders placed or performed during the hospital encounter of 09/07/17 (from the past 48 hour(s))  Comprehensive metabolic panel     Status: Abnormal   Collection Time: 09/07/17  4:38 PM  Result Value Ref Range   Sodium 132 (L) 135 - 145 mmol/L   Potassium 4.0 3.5 - 5.1 mmol/L   Chloride 102 101 - 111 mmol/L   CO2 21 (L) 22 - 32 mmol/L   Glucose, Bld 111 (H) 65 - 99 mg/dL   BUN 6 6 - 20 mg/dL   Creatinine, Ser 0.99 0.44 - 1.00 mg/dL   Calcium 8.6 (L) 8.9 - 10.3 mg/dL   Total Protein 6.7 6.5 - 8.1 g/dL   Albumin 3.4 (L) 3.5 - 5.0 g/dL   AST 26 15 - 41 U/L   ALT 16 14 - 54 U/L   Alkaline Phosphatase 102 38 - 126 U/L   Total Bilirubin 1.1 0.3 - 1.2 mg/dL   GFR calc non Af Amer >60 >60 mL/min   GFR calc Af Amer >60 >60 mL/min    Comment: (NOTE) The eGFR has been calculated using the CKD EPI equation. This calculation has not been validated in all clinical situations. eGFR's persistently <60 mL/min signify possible Chronic Kidney Disease.    Anion gap 9 5 - 15  Lipase, blood      Status: None   Collection Time: 09/07/17  4:38 PM  Result Value Ref Range   Lipase 23 11 - 51 U/L  Basic metabolic panel     Status: Abnormal   Collection Time: 09/08/17  4:49 AM  Result Value Ref Range   Sodium 134 (L) 135 - 145 mmol/L   Potassium 3.9 3.5 - 5.1 mmol/L   Chloride 105 101 - 111 mmol/L   CO2 20 (L) 22 - 32 mmol/L   Glucose, Bld 127 (H) 65 - 99 mg/dL   BUN 5 (L) 6 - 20 mg/dL   Creatinine, Ser 0.95 0.44 -  1.00 mg/dL   Calcium 8.1 (L) 8.9 - 10.3 mg/dL   GFR calc non Af Amer >60 >60 mL/min   GFR calc Af Amer >60 >60 mL/min    Comment: (NOTE) The eGFR has been calculated using the CKD EPI equation. This calculation has not been validated in all clinical situations. eGFR's persistently <60 mL/min signify possible Chronic Kidney Disease.    Anion gap 9 5 - 15  CBC     Status: None   Collection Time: 09/08/17  4:49 AM  Result Value Ref Range   WBC 9.3 4.0 - 10.5 K/uL   RBC 4.09 3.87 - 5.11 MIL/uL   Hemoglobin 12.4 12.0 - 15.0 g/dL   HCT 39.1 36.0 - 46.0 %   MCV 95.6 78.0 - 100.0 fL   MCH 30.3 26.0 - 34.0 pg   MCHC 31.7 30.0 - 36.0 g/dL   RDW 14.2 11.5 - 15.5 %   Platelets 201 150 - 400 K/uL  Gentamicin level, random     Status: None   Collection Time: 09/08/17  3:46 PM  Result Value Ref Range   Gentamicin Rm 1.1 ug/mL    Comment:        Random Gentamicin therapeutic range is dependent on dosage and time of specimen collection. A peak range is 5.0-10.0 ug/mL A trough range is 0.5-2.0 ug/mL        Performed at [facility], [address], [place], [place] [address]    No results found for: SDES, SPECREQUEST, CULT, REPTSTATUS Ct Abdomen Pelvis W Contrast  Result Date: 09/07/2017 CLINICAL DATA:  Fever chills abdominal pain, right lower quadrant EXAM: CT ABDOMEN AND PELVIS WITH CONTRAST TECHNIQUE: Multidetector CT imaging of the abdomen and pelvis was performed using the standard protocol following bolus administration of intravenous contrast. CONTRAST:   168m ISOVUE-300 IOPAMIDOL (ISOVUE-300) INJECTION 61% COMPARISON:  None. FINDINGS: Lower chest: Lung bases demonstrate streaky atelectasis posteriorly. Possible left anterior lung base subpleural nodule measuring 9 mm. Heart size within normal limits. Hepatobiliary: Probable focal fat infiltration near the falciform ligament. No calcified gallstones. No biliary dilatation Pancreas: Unremarkable. No pancreatic ductal dilatation or surrounding inflammatory changes. Spleen: Normal in size without focal abnormality. Adrenals/Urinary Tract: Adrenal glands are within normal limits. Kidneys demonstrate subcentimeter cortical hypodensities too small to further characterize. No hydronephrosis. Increased density in the left renal pelvis may reflect early excretion of contrast as opposed to a stone. There are however punctate stones present within both kidneys. The bladder is unremarkable Stomach/Bowel: The stomach is nonenlarged. No dilated small bowel. Enlarged appendix, measuring up to 14 mm and contains increased intraluminal density at the tip and orifice which may reflect small stones. Small gas collection paralleling the proximal lumen may reflect small contained perforation. Moderate fluid and surrounding inflammatory reaction in the right lower quadrant. Vascular/Lymphatic: No significant vascular findings are present. No enlarged abdominal or pelvic lymph nodes. Reproductive: Uterus and bilateral adnexa are unremarkable. Other: Negative for free air.  Tiny fat in the umbilicus. Musculoskeletal: Multiple foci of sclerosis within the spine and pelvic bones. IMPRESSION: 1. Enlarged appendix with probable small stones in the proximal lumen and the tip. Suspected small extraluminal gas collection adjacent to the proximal portion of the appendix with moderate to marked inflammatory response in the right lower quadrant with small fluid. Findings are suspicious for acute appendicitis with contained perforation. No abscess.  2. Possible left anterior lung base subpleural nodule. Recommend nonemergent CT evaluation. 3. Multiple foci of sclerosis within the spine and pelvic bones,  the pattern is somewhat suspicious for metastatic disease. Clinical correlation is recommended. Bone scan correlation as indicated. Electronically Signed   By: Donavan Foil M.D.   On: 09/07/2017 21:52   No results found for this or any previous visit (from the past 240 hour(s)).    09/09/2017, 4:30 PM     LOS: 1 day    Records and images were personally reviewed where available.  Bobby Rumpf, MD [facility] [phone] 09/09/2017, 4:30 PM

## 2017-09-09 NOTE — Progress Notes (Signed)
2 Days Post-Op    CC:  Abdominal pain  Subjective: Patient in bed, she appears to be very uncomfortable. She hasn't been out of bed much at all. Just up to the bathroom 1. She is tolerating clear liquids. Genvoya was restarted yesterday.  Port sites all look good, drainage from the JP is still serous and cloudy.  Complaining of some GERD sx.  Objective: Vital signs in last 24 hours: Temp:  [98.5 F (36.9 C)-98.8 F (37.1 C)] 98.6 F (37 C) (09/10 0523) Pulse Rate:  [78-86] 86 (09/10 0523) Resp:  [15-18] 18 (09/10 0523) BP: (111-140)/(50-87) 122/75 (09/10 0523) SpO2:  [94 %-96 %] 96 % (09/10 0523) Last BM Date: 09/06/17 840 PO Urine 1200 Drain 30 Afebrile, VSS No labs this AM  Intake/Output from previous day: 09/09 0701 - 09/10 0700 In: 840 [P.O.:840] Out: 1230 [Urine:1200; Drains:30] Intake/Output this shift: No intake/output data recorded.  General appearance: alert, cooperative and no distress Resp: some rales in the bases. GI: tender and sore. Port sites look fine. JP drain is serous and cloudy. No BM.  Lab Results:   Recent Labs  09/07/17 1440 09/08/17 0449  WBC 16.8* 9.3  HGB 13.7 12.4  HCT 41.3 39.1  PLT 243 201    BMET  Recent Labs  09/07/17 1638 09/08/17 0449  NA 132* 134*  K 4.0 3.9  CL 102 105  CO2 21* 20*  GLUCOSE 111* 127*  BUN 6 5*  CREATININE 0.99 0.95  CALCIUM 8.6* 8.1*   PT/INR No results for input(s): LABPROT, INR in the last 72 hours.   Recent Labs Lab 09/07/17 1638  AST 26  ALT 16  ALKPHOS 102  BILITOT 1.1  PROT 6.7  ALBUMIN 3.4*     Lipase     Component Value Date/Time   LIPASE 23 09/07/2017 1638     Prior to Admission medications   Medication Sig Start Date End Date Taking? Authorizing Provider  diphenhydrAMINE (BENADRYL) 25 MG tablet Take 25 mg by mouth every 6 (six) hours as needed for itching or allergies. Reported on 02/29/2016   Yes [provider]  GENVOYA 150-150-200-10 MG TABS tablet TAKE ONE  TABLET BY MOUTH ONCE DAILY WITH BREAKFAST. 12/12/16  Yes Michel Bickers, MD  ibuprofen (ADVIL,MOTRIN) 800 MG tablet Take 0.5-1 tablets (400-800 mg total) by mouth every 8 (eight) hours as needed for mild pain, moderate pain or cramping. 06/27/15  Yes Servando Salina, MD   Medications: . docusate sodium  100 mg Oral BID  . elvitegravir-cobicistat-emtricitabine-tenofovir  1 tablet Oral Q breakfast  . enoxaparin (LOVENOX) injection  40 mg Subcutaneous Q24H   . sodium chloride 1 mL (09/09/17 0519)  . ciprofloxacin Stopped (09/08/17 2247)   And  . metronidazole 500 mg (09/09/17 0513)  . gentamicin 390 mg (09/09/17 0513)   Anti-infectives    Start     Dose/Rate Route Frequency Ordered Stop   09/08/17 1000  ciprofloxacin (CIPRO) IVPB 400 mg     400 mg 200 mL/hr over 60 Minutes Intravenous Every 12 hours 09/08/17 0213     09/08/17 0800  elvitegravir-cobicistat-emtricitabine-tenofovir (GENVOYA) 150-150-200-10 MG tablet 1 tablet     1 tablet Oral Daily with breakfast 09/08/17 0344     09/08/17 0600  metroNIDAZOLE (FLAGYL) IVPB 500 mg     500 mg 100 mL/hr over 60 Minutes Intravenous Every 8 hours 09/08/17 0213     09/08/17 0500  gentamicin (GARAMYCIN) 390 mg in dextrose 5 % 100 mL IVPB  5 mg/kg  77.7 kg (Adjusted) 109.8 mL/hr over 60 Minutes Intravenous Every 24 hours 09/08/17 0340     09/08/17 0400  gentamicin (GARAMYCIN) 390 mg in dextrose 5 % 100 mL IVPB  Status:  Discontinued     5 mg/kg  77.7 kg (Adjusted) 109.8 mL/hr over 60 Minutes Intravenous Every 24 hours 09/08/17 0246 09/08/17 0340   09/07/17 2215  ciprofloxacin (CIPRO) IVPB 400 mg     400 mg 200 mL/hr over 60 Minutes Intravenous  Once 09/07/17 2204 09/07/17 2315   09/07/17 2215  metroNIDAZOLE (FLAGYL) IVPB 500 mg     500 mg 100 mL/hr over 60 Minutes Intravenous  Once 09/07/17 2204 09/07/17 2316      Assessment/Plan Acute appendicitis with perforation, peritoneal abscess, localized peritonitis, gangrene and multiple  fecaliths. S/p laparoscopic appendectomy, 09/08/17, Dr. Romana Juniper HIV Hx of rosacea. Body mass index is 44.9 FEN:  IV fluids/NPO ID:  Cipro 9/8 =>> day 3  Flagyl 9/8 =>> day 3 Gentamycin 9/9 =>> day 2 Antiviral =>> elvitegravir-cobicistat-emtricitabine-tenofovir 9/9 =>> day 2 in hospital DVT:  Lovenox  Plan:  Ambulate her some, work on pain control.  Advance to fulls, recheck labs in AM.  I have ask Dr. Johnnye Sima ID to see and follow with [name].         LOS: 1 day    Chrishon Martino 09/09/2017 [phone]

## 2017-09-09 NOTE — Progress Notes (Signed)
Pt was complaining of chest pain, v/s stable, did EKG shows NSR and given dilaudid and zofran, latest pain less than 5, on call MD Dr. Serita Grammes aware, will continue to monitor.

## 2017-09-10 LAB — BASIC METABOLIC PANEL
ANION GAP: 6 (ref 5–15)
BUN: 9 mg/dL (ref 6–20)
CHLORIDE: 105 mmol/L (ref 101–111)
CO2: 24 mmol/L (ref 22–32)
Calcium: 8.4 mg/dL — ABNORMAL LOW (ref 8.9–10.3)
Creatinine, Ser: 0.9 mg/dL (ref 0.44–1.00)
GFR calc non Af Amer: >60 mL/min (ref >60)
GLUCOSE: 111 mg/dL — AB (ref 65–99)
Potassium: 4 mmol/L (ref 3.5–5.1)
Sodium: 135 mmol/L (ref 135–145)

## 2017-09-10 LAB — CBC
HCT: 31.9 % — ABNORMAL LOW (ref 36.0–46.0)
Hemoglobin: 10.1 g/dL — ABNORMAL LOW (ref 12.0–15.0)
MCH: 30.1 pg (ref 26.0–34.0)
MCHC: 31.7 g/dL (ref 30.0–36.0)
MCV: 94.9 fL (ref 78.0–100.0)
PLATELETS: 223 10*3/uL (ref 150–400)
RBC: 3.36 MIL/uL — ABNORMAL LOW (ref 3.87–5.11)
RDW: 14.5 % (ref 11.5–15.5)
WBC: 9.3 10*3/uL (ref 4.0–10.5)

## 2017-09-10 MED ORDER — SODIUM CHLORIDE 0.9% FLUSH
3.0000 mL | Freq: Three times a day (TID) | INTRAVENOUS | Status: DC
  Administered 2017-09-10 (×2): 3 mL via INTRAVENOUS

## 2017-09-10 MED ORDER — HYDROMORPHONE HCL 1 MG/ML IJ SOLN: 0.5000 mg | INTRAMUSCULAR | Status: DC | PRN

## 2017-09-10 NOTE — Progress Notes (Signed)
INFECTIOUS DISEASE PROGRESS NOTE  ID: Margaret Porter is a 47 y.o. female with  Active Problems:   Perforated appendicitis  Subjective: Eating well, has been up to chair, has not ambulated today.   Abtx:  Anti-infectives    Start     Dose/Rate Route Frequency Ordered Stop   09/08/17 1000  ciprofloxacin (CIPRO) IVPB 400 mg     400 mg 200 mL/hr over 60 Minutes Intravenous Every 12 hours 09/08/17 0213     09/08/17 0800  elvitegravir-cobicistat-emtricitabine-tenofovir (GENVOYA) 150-150-200-10 MG tablet 1 tablet     1 tablet Oral Daily with breakfast 09/08/17 0344     09/08/17 0600  metroNIDAZOLE (FLAGYL) IVPB 500 mg     500 mg 100 mL/hr over 60 Minutes Intravenous Every 8 hours 09/08/17 0213     09/08/17 0500  gentamicin (GARAMYCIN) 390 mg in dextrose 5 % 100 mL IVPB  Status:  Discontinued     5 mg/kg  77.7 kg (Adjusted) 109.8 mL/hr over 60 Minutes Intravenous Every 24 hours 09/08/17 0340 09/09/17 1644   09/08/17 0400  gentamicin (GARAMYCIN) 390 mg in dextrose 5 % 100 mL IVPB  Status:  Discontinued     5 mg/kg  77.7 kg (Adjusted) 109.8 mL/hr over 60 Minutes Intravenous Every 24 hours 09/08/17 0246 09/08/17 0340   09/07/17 2215  ciprofloxacin (CIPRO) IVPB 400 mg     400 mg 200 mL/hr over 60 Minutes Intravenous  Once 09/07/17 2204 09/07/17 2315   09/07/17 2215  metroNIDAZOLE (FLAGYL) IVPB 500 mg     500 mg 100 mL/hr over 60 Minutes Intravenous  Once 09/07/17 2204 09/07/17 2316      Medications:  Scheduled: . acetaminophen  1,000 mg Oral Q8H  . docusate sodium  100 mg Oral BID  . elvitegravir-cobicistat-emtricitabine-tenofovir  1 tablet Oral Q breakfast  . enoxaparin (LOVENOX) injection  40 mg Subcutaneous Q24H  . famotidine  20 mg Oral BID  . methocarbamol  750 mg Oral Q8H  . sodium chloride flush  3 mL Intravenous Q8H    Objective: Vital signs in last 24 hours: Temp:  [97.8 F (36.6 C)-98 F (36.7 C)] 97.8 F (36.6 C) (09/11 1350) Pulse Rate:  [61-81] 61 (09/11  1350) Resp:  [19-20] 20 (09/11 1350) BP: (116)/(64-70) 116/64 (09/11 1350) SpO2:  [98 %-99 %] 99 % (09/11 1350)   General appearance: alert, cooperative and no distress Resp: clear to auscultation bilaterally Cardio: regular rate and rhythm GI: normal findings: bowel sounds normal and soft, non-tender  Lab Results  Recent Labs  09/08/17 0449 09/10/17 0317  WBC 9.3 9.3  HGB 12.4 10.1*  HCT 39.1 31.9*  NA 134* 135  K 3.9 4.0  CL 105 105  CO2 20* 24  BUN 5* 9  CREATININE 0.95 0.90   Liver Panel No results for input(s): PROT, ALBUMIN, AST, ALT, ALKPHOS, BILITOT, BILIDIR, IBILI in the last 72 hours. Sedimentation Rate No results for input(s): ESRSEDRATE in the last 72 hours. C-Reactive Protein No results for input(s): CRP in the last 72 hours.  Microbiology: No results found for this or any previous visit (from the past 240 hour(s)).  Studies/Results: No results found.   Assessment/Plan: HIV+ Perforated appendix  Total days of antibiotics: 2 cipro/flagyl genvoya  Plan for home in AM- WBC normal, afebrile.  Home with oral cipro/flagyl po? Will defer to surgery.  My appreciation to surgery for their excellent care of her.  Pt can f/u in ID clinic with her MD there.  Available  as needed          Bobby Rumpf Infectious Diseases (pager) [phone] [email or web address] 09/10/2017, 5:51 PM  LOS: 2 days

## 2017-09-10 NOTE — Progress Notes (Signed)
[facility] Progress Note  3 Days Post-Op  Subjective: CC: perforated appendicitis Patient states abdominal pain is improving. Reports cramping related to menses. Passing flatus, no BM. Denies nausea or vomiting.  UOP good. VSS.   Objective: Vital signs in last 24 hours: Temp:  [97.8 F (36.6 C)-98.4 F (36.9 C)] 98 F (36.7 C) (09/10 1951) Pulse Rate:  [74-81] 81 (09/10 1951) Resp:  [18-19] 19 (09/10 1951) BP: (116-126)/(70-71) 116/70 (09/10 1951) SpO2:  [95 %-98 %] 98 % (09/10 1951) Last BM Date: 09/06/17  Intake/Output from previous day: 09/10 0701 - 09/11 0700 In: 4000 [P.O.:1030; I.V.:2270; IV Piggyback:700] Out: 475 [Urine:400; Drains:75] Intake/Output this shift: No intake/output data recorded.  PE: Gen:  Alert, NAD, pleasant Card:  Regular rate and rhythm Pulm:  Normal effort, clear to auscultation bilaterally Abd: Soft, non-tender, non-distended, bowel sounds present, no HSM, incisions C/D/I; JP in LLQ with serosanguinous drainage Skin: warm and dry, no rashes  Psych: A&Ox3   Lab Results:   Recent Labs  09/08/17 0449 09/10/17 0317  WBC 9.3 9.3  HGB 12.4 10.1*  HCT 39.1 31.9*  PLT 201 223   BMET  Recent Labs  09/08/17 0449 09/10/17 0317  NA 134* 135  K 3.9 4.0  CL 105 105  CO2 20* 24  GLUCOSE 127* 111*  BUN 5* 9  CREATININE 0.95 0.90  CALCIUM 8.1* 8.4*   PT/INR No results for input(s): LABPROT, INR in the last 72 hours. CMP     Component Value Date/Time   NA 135 09/10/2017 0317   K 4.0 09/10/2017 0317   CL 105 09/10/2017 0317   CO2 24 09/10/2017 0317   GLUCOSE 111 (H) 09/10/2017 0317   BUN 9 09/10/2017 0317   CREATININE 0.90 09/10/2017 0317   CREATININE 0.83 05/22/2017 0745   CALCIUM 8.4 (L) 09/10/2017 0317   PROT 6.7 09/07/2017 1638   ALBUMIN 3.4 (L) 09/07/2017 1638   AST 26 09/07/2017 1638   ALT 16 09/07/2017 1638   ALKPHOS 102 09/07/2017 1638   BILITOT 1.1 09/07/2017 1638   GFRNONAA >60 09/10/2017 0317   GFRAA  >60 09/10/2017 0317   Lipase     Component Value Date/Time   LIPASE 23 09/07/2017 1638       Studies/Results: No results found.  Anti-infectives: Anti-infectives    Start     Dose/Rate Route Frequency Ordered Stop   09/08/17 1000  ciprofloxacin (CIPRO) IVPB 400 mg     400 mg 200 mL/hr over 60 Minutes Intravenous Every 12 hours 09/08/17 0213     09/08/17 0800  elvitegravir-cobicistat-emtricitabine-tenofovir (GENVOYA) 150-150-200-10 MG tablet 1 tablet     1 tablet Oral Daily with breakfast 09/08/17 0344     09/08/17 0600  metroNIDAZOLE (FLAGYL) IVPB 500 mg     500 mg 100 mL/hr over 60 Minutes Intravenous Every 8 hours 09/08/17 0213     09/08/17 0500  gentamicin (GARAMYCIN) 390 mg in dextrose 5 % 100 mL IVPB  Status:  Discontinued     5 mg/kg  77.7 kg (Adjusted) 109.8 mL/hr over 60 Minutes Intravenous Every 24 hours 09/08/17 0340 09/09/17 1644   09/08/17 0400  gentamicin (GARAMYCIN) 390 mg in dextrose 5 % 100 mL IVPB  Status:  Discontinued     5 mg/kg  77.7 kg (Adjusted) 109.8 mL/hr over 60 Minutes Intravenous Every 24 hours 09/08/17 0246 09/08/17 0340   09/07/17 2215  ciprofloxacin (CIPRO) IVPB 400 mg     400 mg 200 mL/hr over 60 Minutes Intravenous  Once 09/07/17 2204 09/07/17 2315   09/07/17 2215  metroNIDAZOLE (FLAGYL) IVPB 500 mg     500 mg 100 mL/hr over 60 Minutes Intravenous  Once 09/07/17 2204 09/07/17 2316       Assessment/Plan Acute appendicitis with perforation, peritoneal abscess, localized peritonitis, gangrene and multiple fecaliths. S/p laparoscopic appendectomy, 09/08/17, Dr. Romana Juniper - WBC 9.3, afebrile - continue cipro/flagyl, will need PO at discharge - JP drain with 75 cc in 24 h, serosanguinous - continue for now, may be able to discontinue prior to discharge - advance diet as tolerated - encourage ambulation  HIV - ID consulted, appreciate input Hx of rosacea. Body mass index is 44.9  FEN: clears, advance as tolerated ID:  Cipro (9/8 >>)  day 3  Flagyl (9/8 >>) day 4, Gentamycin (9/9>9/10)  Antiviral =>> elvitegravir-cobicistat-emtricitabine-tenofovir 9/9 =>> day 3 in hospital DVT:  Lovenox Foley: none Follow up: [facility]  Plan: Advance diet. Continue abx. Likely ready for discharge home tomorrow.   LOS: 2 days    Brigid Re , [facility] 09/10/2017, 8:34 AM Pager: [phone] Consults: [phone] Mon-Fri 7:00 am-4:30 pm Sat-Sun 7:00 am-11:30 am

## 2017-09-10 NOTE — Discharge Instructions (Signed)

## 2017-09-11 MED ORDER — OXYCODONE HCL 5 MG PO TABS: 5.0000 mg | ORAL_TABLET | ORAL | 0 refills | Status: DC | PRN

## 2017-09-11 MED ORDER — METRONIDAZOLE 500 MG PO TABS: 500.0000 mg | ORAL_TABLET | Freq: Two times a day (BID) | ORAL | 0 refills | Status: DC

## 2017-09-11 MED ORDER — METHOCARBAMOL 750 MG PO TABS: 750.0000 mg | ORAL_TABLET | Freq: Three times a day (TID) | ORAL | 0 refills | Status: DC | PRN

## 2017-09-11 MED ORDER — CIPROFLOXACIN HCL 500 MG PO TABS: 500.0000 mg | ORAL_TABLET | Freq: Two times a day (BID) | ORAL | 0 refills | Status: DC

## 2017-09-11 NOTE — Discharge Summary (Signed)
[facility] Discharge Summary   Patient ID: Margaret Porter MRN: [id number] DOB/AGE: 49-May-1971 49 y.o.  Admit date: 09/07/2017 Discharge date: 09/11/2017  Admitting Diagnosis: Acute appendicitis  Discharge Diagnosis Acute ruptured appendicitis - s/p laparoscopic appendectomy  Consultants Infectious disease  Imaging: CT 9/8: Enlarged appendix with probable small stones in the proximal lumen and the tip. Suspected small extraluminal gas collection adjacent to the proximal portion of the appendix with moderate to marked inflammatory response in the right lower quadrant with small fluid. Findings are suspicious for acute appendicitis with contained perforation. No abscess.  Procedures Dr. Kae Heller (09/08/17) - Laparoscopic Appendectomy  Hospital Course:  Patient is a 47 y/o female who presented to [facility] with abdominal pain.  Workup showed acute appendicitis.  Patient was admitted and underwent procedure listed above.  Tolerated procedure well and was transferred to the floor.  Diet was advanced as tolerated.  On POD#3, the patient was voiding well, tolerating diet, ambulating well, pain well controlled, vital signs stable, incisions c/d/i and felt stable for discharge home. Drain discontinued prior to discharge. Patient will follow up in our office in 2 weeks and knows to call with questions or concerns. She will call to confirm appointment date/time.    Physical Exam: General:  Alert, NAD, pleasant, comfortable Abd:  Soft, ND, mild tenderness, incisions C/D/I, drain with minimal sanguinous drainage  I have personally looked this patient up in the [place] Controlled Substance Database and reviewed their medications.  Allergies as of 09/11/2017      Reactions   Amoxicillin Rash   Aspirin Anxiety   Sulfamethoxazole-trimethoprim Rash      Medication List    TAKE these medications   ciprofloxacin 500 MG tablet Commonly known as:  CIPRO Take 1 tablet (500 mg total) by mouth 2  (two) times daily.   diphenhydrAMINE 25 MG tablet Commonly known as:  BENADRYL Take 25 mg by mouth every 6 (six) hours as needed for itching or allergies. Reported on 02/29/2016   GENVOYA 150-150-200-10 MG Tabs tablet Generic drug:  elvitegravir-cobicistat-emtricitabine-tenofovir TAKE ONE TABLET BY MOUTH ONCE DAILY WITH BREAKFAST.   ibuprofen 800 MG tablet Commonly known as:  ADVIL,MOTRIN Take 0.5-1 tablets (400-800 mg total) by mouth every 8 (eight) hours as needed for mild pain, moderate pain or cramping.   methocarbamol 750 MG tablet Commonly known as:  ROBAXIN Take 1 tablet (750 mg total) by mouth every 8 (eight) hours as needed for muscle spasms.   metroNIDAZOLE 500 MG tablet Commonly known as:  FLAGYL Take 1 tablet (500 mg total) by mouth 2 (two) times daily with a meal. DO NOT CONSUME ALCOHOL WHILE TAKING THIS MEDICATION.   oxyCODONE 5 MG immediate release tablet Commonly known as:  Oxy IR/ROXICODONE Take 1 tablet (5 mg total) by mouth every 4 (four) hours as needed for moderate pain.            Discharge Care Instructions        Start     Ordered   09/11/17 0000  methocarbamol (ROBAXIN) 750 MG tablet  Every 8 hours PRN    Question:  Supervising Provider  Answer:  Erroll Luna   09/11/17 0916   09/11/17 0000  oxyCODONE (OXY IR/ROXICODONE) 5 MG immediate release tablet  Every 4 hours PRN    Question:  Supervising Provider  Answer:  Erroll Luna   09/11/17 0916   09/11/17 0000  ciprofloxacin (CIPRO) 500 MG tablet  2 times daily    Question:  Supervising Provider  AnswerErroll Luna   09/11/17 4098   09/11/17 0000  metroNIDAZOLE (FLAGYL) 500 MG tablet  2 times daily with meals     09/11/17 0916       Follow-up Information    [facility] [place]. Go on 09/24/2017.   Specialty:  General Surgery Why:  Your appointment is at 10:45 AM. Please arrive 30 min prior to appointment time. Bring photo ID and insurance information.  Contact  information: [address]  [place] [address] [phone]           Signed: Brigid Re , [facility] 09/11/2017, 9:18 AM Pager: [phone] Consults: [phone] Mon-Fri 7:00 am-4:30 pm Sat-Sun 7:00 am-11:30 am

## 2017-09-11 NOTE — Progress Notes (Signed)
Discharge home. Home discharge instruction given, no question verbalized. 

## 2017-09-16 ENCOUNTER — Other Ambulatory Visit: Payer: Self-pay | Admitting: Internal Medicine

## 2017-09-16 DIAGNOSIS — B20 Human immunodeficiency virus [HIV] disease: Secondary | ICD-10-CM

## 2017-09-18 ENCOUNTER — Encounter: Payer: Self-pay | Admitting: Medical

## 2017-09-18 ENCOUNTER — Ambulatory Visit (INDEPENDENT_AMBULATORY_CARE_PROVIDER_SITE_OTHER): Payer: BC Managed Care – PPO | Admitting: Medical

## 2017-09-18 VITALS — BP 116/70 | HR 98 | Temp 98.0°F | Wt 250.8 lb

## 2017-09-18 DIAGNOSIS — T7840XA Allergy, unspecified, initial encounter: Secondary | ICD-10-CM | POA: Diagnosis not present

## 2017-09-18 DIAGNOSIS — T887XXA Unspecified adverse effect of drug or medicament, initial encounter: Secondary | ICD-10-CM | POA: Diagnosis not present

## 2017-09-18 DIAGNOSIS — L989 Disorder of the skin and subcutaneous tissue, unspecified: Secondary | ICD-10-CM | POA: Diagnosis not present

## 2017-09-18 DIAGNOSIS — T50905A Adverse effect of unspecified drugs, medicaments and biological substances, initial encounter: Secondary | ICD-10-CM

## 2017-09-18 DIAGNOSIS — Z23 Encounter for immunization: Secondary | ICD-10-CM | POA: Diagnosis not present

## 2017-09-18 NOTE — Progress Notes (Signed)
Subjective: Chief Complaint  Patient presents with  . possible reaction to new meds    icthy throat , sweating, some coughing , some fever    Here for possible reaction to medication/allergic reaction.  She was just hospitalized recently for ruptured appendicitis.  Had surgery, was discharged on Cipro, Flagyl, robaxin and oxycodone.   While in the hospital and up until today has had itching in general, itchy throat, cough, sweats.   Thinks she is reacting to one of the medications.  Has felt feverish at times.  She denies rash, NVD, bleeding.     Of note, she has hx/o HIV, idiopathic urticaria.   She does have surgery f/u outpatient scheduled.  Hospitalized 09/07/17-09/11/17 for ruptured appendicitis, s/p laparoscopic appendectomy.  Chart record reviewed, discharge summary reviewed.  No other aggravating or relieving factors. No other complaint.   Past Medical History:  Diagnosis Date  . Allergic rhinitis   . HIV infection ([place]) 2006  . Idiopathic urticaria   . Rosacea    Current Outpatient Prescriptions on File Prior to Visit  Medication Sig Dispense Refill  . diphenhydrAMINE (BENADRYL) 25 MG tablet Take 25 mg by mouth every 6 (six) hours as needed for itching or allergies. Reported on 02/29/2016    . GENVOYA 150-150-200-10 MG TABS tablet TAKE ONE TABLET BY MOUTH ONCE DAILY WITH BREAKFAST. STORE IN ORIGINALCONTAINER AT ROOM TEMPERATURE. 30 tablet 2  . ibuprofen (ADVIL,MOTRIN) 800 MG tablet Take 0.5-1 tablets (400-800 mg total) by mouth every 8 (eight) hours as needed for mild pain, moderate pain or cramping. 30 tablet 0  . methocarbamol (ROBAXIN) 750 MG tablet Take 1 tablet (750 mg total) by mouth every 8 (eight) hours as needed for muscle spasms. (Patient not taking: Reported on 09/18/2017) 30 tablet 0  . metroNIDAZOLE (FLAGYL) 500 MG tablet Take 1 tablet (500 mg total) by mouth 2 (two) times daily with a meal. DO NOT CONSUME ALCOHOL WHILE TAKING THIS MEDICATION. (Patient not taking: Reported  on 09/18/2017) 14 tablet 0  . oxyCODONE (OXY IR/ROXICODONE) 5 MG immediate release tablet Take 1 tablet (5 mg total) by mouth every 4 (four) hours as needed for moderate pain. (Patient not taking: Reported on 09/18/2017) 15 tablet 0   No current facility-administered medications on file prior to visit.    ROS as in subjective    Objective: BP 116/70   Pulse 98   Temp 98 F (36.7 C)   Wt 250 lb 12.8 oz (113.8 kg)   LMP 08/22/2017   SpO2 97%   BMI 44.43 kg/m   General appearance: alert, no distress, WD/WN,  HEENT: normocephalic, sclerae anicteric, TMs pearly, nares patent, no discharge or erythema, pharynx normal Oral cavity: MMM, no lesions Neck: supple, no lymphadenopathy, no thyromegaly, no masses Heart: RRR, normal S1, S2, no murmurs Lungs: CTA bilaterally, no wheezes, rhonchi, or rales Abdomen: +bs, soft, non tender, non distended, no masses, no hepatomegaly, no splenomegaly Pulses: 2+ symmetric, upper and lower extremities, normal cap refill Ext: no edema Left face below left orbit with 4-5mm diameter slightly raised pink/red lesion with crusted scab center    Assessment: Encounter Diagnoses  Name Primary?  . Allergic reaction, initial encounter Yes  . Adverse effect of drug, initial encounter   . Skin lesion   . Need for influenza vaccination      Plan: I suspect her itching and symptom are related to adverse effect of oxycodone.   She has stopped all 4 new medications as of today.   Advised  she not restart them.  She can use Tylenol prn for pain, benadryl BID the next 3-5 days, rest, hydrate well.    If worse or not improving, recheck immediately.  She has f/u planned with surgery.  Skin lesion - refer to dermatology  Counseled on the influenza virus vaccine.  Vaccine information sheet given.  Influenza vaccine given after consent obtained.  Margaret Porter was seen today for possible reaction to new meds.  Diagnoses and all orders for this visit:  Allergic  reaction, initial encounter  Adverse effect of drug, initial encounter  Skin lesion  Need for influenza vaccination  Other orders -     Ambulatory referral to Dermatology

## 2017-09-18 NOTE — Addendum Note (Signed)
Addended by: Tyrone Apple on: 09/18/2017 04:20 PM   Modules accepted: Orders

## 2017-11-11 ENCOUNTER — Encounter (INDEPENDENT_AMBULATORY_CARE_PROVIDER_SITE_OTHER): Payer: BC Managed Care – PPO | Admitting: *Deleted

## 2017-11-11 ENCOUNTER — Encounter: Payer: Self-pay | Admitting: Internal Medicine

## 2017-11-11 VITALS — BP 137/85 | HR 76 | Temp 98.5°F | Wt 258.2 lb

## 2017-11-11 DIAGNOSIS — Z006 Encounter for examination for normal comparison and control in clinical research program: Secondary | ICD-10-CM

## 2017-11-11 LAB — CD4/CD8 (T-HELPER/T-SUPPRESSOR CELL)
CD4 COUNT: 716
CD4 T CELL HELPER: 35.8
CD8 % Suppressor T Cell: 46.8
CD8 T Cell Abs: 936

## 2017-11-11 NOTE — Progress Notes (Signed)
Margaret Porter is here for her week 240 visit for [facility] Study: Decay of HIV-1 Reservoirs in Subjects on long-term ARVs, an observational study. She was in the hospital in September for a perforated appendix and did well postop. She has completely recuperated from that. She denies any other problems since last visit. She will be returning in February for the next A5322 visit.

## 2017-11-12 LAB — COMPREHENSIVE METABOLIC PANEL
AG Ratio: 1.8 (calc) (ref 1.0–2.5)
ALBUMIN MSPROF: 4.1 g/dL (ref 3.6–5.1)
ALKALINE PHOSPHATASE (APISO): 104 U/L (ref 33–115)
ALT: 14 U/L (ref 6–29)
AST: 17 U/L (ref 10–35)
BUN: 15 mg/dL (ref 7–25)
CALCIUM: 8.9 mg/dL (ref 8.6–10.2)
CO2: 24 mmol/L (ref 20–32)
CREATININE: 1 mg/dL (ref 0.50–1.10)
Chloride: 106 mmol/L (ref 98–110)
GLOBULIN: 2.3 g/dL (ref 1.9–3.7)
GLUCOSE: 95 mg/dL (ref 65–99)
POTASSIUM: 4.4 mmol/L (ref 3.5–5.3)
Sodium: 139 mmol/L (ref 135–146)
Total Bilirubin: 0.3 mg/dL (ref 0.2–1.2)
Total Protein: 6.4 g/dL (ref 6.1–8.1)

## 2017-11-12 LAB — HEPATITIS C ANTIBODY
HEP C AB: NONREACTIVE
SIGNAL TO CUT-OFF: 0.01 (ref <1.00)

## 2017-11-25 ENCOUNTER — Encounter: Payer: Self-pay | Admitting: Internal Medicine

## 2017-11-27 ENCOUNTER — Encounter: Payer: Self-pay | Admitting: *Deleted

## 2017-11-27 LAB — HIV-1 RNA QUANT-NO REFLEX-BLD: HIV-1 RNA Viral Load: <20

## 2017-12-04 ENCOUNTER — Other Ambulatory Visit: Payer: Self-pay | Admitting: Internal Medicine

## 2017-12-04 DIAGNOSIS — B20 Human immunodeficiency virus [HIV] disease: Secondary | ICD-10-CM

## 2018-01-20 ENCOUNTER — Ambulatory Visit: Payer: BC Managed Care – PPO | Admitting: Internal Medicine

## 2018-01-20 ENCOUNTER — Other Ambulatory Visit: Payer: Self-pay

## 2018-01-20 ENCOUNTER — Encounter: Payer: Self-pay | Admitting: Internal Medicine

## 2018-01-20 DIAGNOSIS — B20 Human immunodeficiency virus [HIV] disease: Secondary | ICD-10-CM

## 2018-01-20 NOTE — Assessment & Plan Note (Signed)
Her infection remains under excellent, long-term control.  She will continue Genvoya and follow-up here after lab work in 1 year.

## 2018-01-20 NOTE — Progress Notes (Signed)
Patient Active Problem List   Diagnosis Date Noted  . Human immunodeficiency virus (HIV) disease ([place]) 01/24/2007    Priority: High  . Left knee pain 11/23/2015    Priority: Medium  . Abnormal dreams 01/06/2014    Priority: Medium  . Obesity 11/06/2007    Priority: Medium  . Perforated appendicitis 09/07/2017  . Hyperglycemia 12/05/2016  . Encounter for health maintenance examination in adult 12/22/2015  . Vitamin D deficiency 12/22/2015  . Need for Tdap vaccination 12/22/2015  . Alkaline phosphatase elevation 12/22/2015  . Elevated blood pressure reading without diagnosis of hypertension 12/22/2015  . Rosacea 04/22/2012  . Acute bronchitis 04/24/2011  . URINARY INCONTINENCE, URGE 10/24/2010  . Hyperlipidemia 11/06/2007  . URTICARIA, IDIOPATHIC 03/06/2007    Patient's Medications  New Prescriptions   No medications on file  Previous Medications   DIPHENHYDRAMINE (BENADRYL) 25 MG TABLET    Take 25 mg by mouth every 6 (six) hours as needed for itching or allergies. Reported on 02/29/2016   GENVOYA 150-150-200-10 MG TABS TABLET    TAKE ONE TABLET BY MOUTH ONCE DAILY WITH BREAKFAST. STORE IN ORIGINALCONTAINER AT ROOM TEMPERATURE.   IBUPROFEN (ADVIL,MOTRIN) 800 MG TABLET    Take 0.5-1 tablets (400-800 mg total) by mouth every 8 (eight) hours as needed for mild pain, moderate pain or cramping.  Modified Medications   No medications on file  Discontinued Medications   METHOCARBAMOL (ROBAXIN) 750 MG TABLET    Take 1 tablet (750 mg total) by mouth every 8 (eight) hours as needed for muscle spasms.   METRONIDAZOLE (FLAGYL) 500 MG TABLET    Take 1 tablet (500 mg total) by mouth 2 (two) times daily with a meal. DO NOT CONSUME ALCOHOL WHILE TAKING THIS MEDICATION.   OXYCODONE (OXY IR/ROXICODONE) 5 MG IMMEDIATE RELEASE TABLET    Take 1 tablet (5 mg total) by mouth every 4 (four) hours as needed for moderate pain.    Subjective: Margaret Porter is in for her routine HIV follow-up  visit.  She has had no problems obtaining, taking or tolerating Genvoya and does not recall missing doses.  She is not on any other medications.  She was hospitalized last September with acute appendicitis.  She underwent laparoscopic appendectomy and had an uneventful recovery.  She is feeling well now.  She wants to lose weight and is planning on joining [employer].  Review of Systems: Review of Systems  Constitutional: Negative for chills, diaphoresis, fever, malaise/fatigue and weight loss.  HENT: Negative for sore throat.   Respiratory: Negative for cough, sputum production and shortness of breath.   Cardiovascular: Negative for chest pain.  Gastrointestinal: Negative for abdominal pain, diarrhea, heartburn, nausea and vomiting.  Genitourinary: Negative for dysuria and frequency.  Musculoskeletal: Positive for joint pain. Negative for myalgias.  Skin: Negative for rash.  Neurological: Negative for dizziness and headaches.  Psychiatric/Behavioral: Negative for depression and substance abuse. The patient is not nervous/anxious.     Past Medical History:  Diagnosis Date  . Allergic rhinitis   . HIV infection ([place]) 2006  . Idiopathic urticaria   . Rosacea     Social History   Tobacco Use  . Smoking status: Never Smoker  . Smokeless tobacco: Never Used  Substance Use Topics  . Alcohol use: Yes    Alcohol/week: 0.0 oz    Comment: occassional (special occasions)  . Drug use: No    Family History  Problem Relation Age of Onset  .  Diabetes Mother   . Diabetes Brother   . Heart disease Paternal Uncle   . Heart disease Maternal Grandmother   . Cancer Maternal Grandmother   . Heart disease Paternal Grandmother     Allergies  Allergen Reactions  . Oxycodone     Questionable mouth itching, sweats, but was also simultaneously on Cipro, Flagyl, and Robaxin.  [name] Kitchen Red Dye   . Amoxicillin Rash  . Aspirin Anxiety  . Sulfamethoxazole-Trimethoprim Rash    Health Maintenance    Topic Date Due  . PAP SMEAR  07/31/2014  . TETANUS/TDAP  12/21/2025  . INFLUENZA VACCINE  Completed  . HIV Screening  Completed    Objective:  Vitals:   01/20/18 1515  BP: 134/85  Pulse: 68  Temp: 97.7 F (36.5 C)  TempSrc: Oral  Weight: 261 lb (118.4 kg)  Height: 5\' 3"  (1.6 m)   Body mass index is 46.23 kg/m.  Physical Exam  Constitutional: She is oriented to person, place, and time.  She is in good spirits as usual.  HENT:  Mouth/Throat: No oropharyngeal exudate.  Eyes: Conjunctivae are normal.  Cardiovascular: Normal rate and regular rhythm.  No murmur heard. Pulmonary/Chest: Effort normal and breath sounds normal.  Abdominal: Soft. She exhibits no mass. There is no tenderness.  Musculoskeletal: Normal range of motion.  Neurological: She is alert and oriented to person, place, and time.  Skin: No rash noted.  Psychiatric: Mood and affect normal.    Lab Results Lab Results  Component Value Date   WBC 9.3 09/10/2017   HGB 10.1 (L) 09/10/2017   HCT 31.9 (L) 09/10/2017   MCV 94.9 09/10/2017   PLT 223 09/10/2017    Lab Results  Component Value Date   CREATININE 1.00 11/11/2017   BUN 15 11/11/2017   NA 139 11/11/2017   K 4.4 11/11/2017   CL 106 11/11/2017   CO2 24 11/11/2017    Lab Results  Component Value Date   ALT 14 11/11/2017   AST 17 11/11/2017   ALKPHOS 102 09/07/2017   BILITOT 0.3 11/11/2017    Lab Results  Component Value Date   CHOL 165 04/04/2017   HDL 35 (L) 04/04/2017   LDLCALC 106 (H) 04/04/2017   TRIG 119 04/04/2017   CHOLHDL 4.7 04/04/2017   Lab Results  Component Value Date   LABRPR NON REAC 11/08/2015   HIV 1 RNA Quant (copies/mL)  Date Value  07/24/2010 39  03/01/2010 39  08/04/2009 39   HIV-1 RNA Viral Load (no units)  Date Value  11/11/2017 <20  05/22/2017 <40  04/04/2017 <40   CD4 (no units)  Date Value  11/08/2015 550  02/08/2015 443  12/21/2013 475     Problem List Items Addressed This Visit       High   Human immunodeficiency virus (HIV) disease ([place])    Her infection remains under excellent, long-term control.  She will continue Genvoya and follow-up here after lab work in 1 year.      Relevant Orders   CBC   T-helper cell (CD4)- ([facility] clinic only)   Comprehensive metabolic panel   Lipid panel   RPR   HIV 1 RNA quant-no reflex-bld        Michel Bickers, MD [facility] [place] [phone] pager   [phone] cell 01/20/2018, 3:28 PM

## 2018-02-14 ENCOUNTER — Encounter (INDEPENDENT_AMBULATORY_CARE_PROVIDER_SITE_OTHER): Payer: Self-pay | Admitting: *Deleted

## 2018-02-14 VITALS — BP 135/88 | HR 63 | Temp 97.9°F | Wt 261.6 lb

## 2018-02-14 DIAGNOSIS — Z006 Encounter for examination for normal comparison and control in clinical research program: Secondary | ICD-10-CM

## 2018-02-14 NOTE — Progress Notes (Signed)
Margaret Porter is here for week 240 visit A5322, [facility] Study: A Long Term follow-up of Older HIV-Infected Adults in the ACTG, an observational study addressing the issues of aging, HIV infection and Inflammation. No new complaints or concerns.  She will return in March for next study visit.

## 2018-02-15 LAB — COMPREHENSIVE METABOLIC PANEL
AG RATIO: 2.2 (calc) (ref 1.0–2.5)
ALT: 16 U/L (ref 6–29)
AST: 19 U/L (ref 10–35)
Albumin: 4 g/dL (ref 3.6–5.1)
Alkaline phosphatase (APISO): 90 U/L (ref 33–115)
BUN: 11 mg/dL (ref 7–25)
CHLORIDE: 108 mmol/L (ref 98–110)
CO2: 25 mmol/L (ref 20–32)
CREATININE: 0.89 mg/dL (ref 0.50–1.10)
Calcium: 9 mg/dL (ref 8.6–10.2)
GLOBULIN: 1.8 g/dL — AB (ref 1.9–3.7)
GLUCOSE: 91 mg/dL (ref 65–99)
Potassium: 4 mmol/L (ref 3.5–5.3)
SODIUM: 141 mmol/L (ref 135–146)
TOTAL PROTEIN: 5.8 g/dL — AB (ref 6.1–8.1)
Total Bilirubin: 0.3 mg/dL (ref 0.2–1.2)

## 2018-02-15 LAB — CBC WITH DIFFERENTIAL/PLATELET
BASOS ABS: 22 {cells}/uL (ref 0–200)
BASOS PCT: 0.5 %
EOS ABS: 22 {cells}/uL (ref 15–500)
Eosinophils Relative: 0.5 %
HCT: 36.9 % (ref 35.0–45.0)
Hemoglobin: 12.3 g/dL (ref 11.7–15.5)
Lymphs Abs: 1552 cells/uL (ref 850–3900)
MCH: 29.9 pg (ref 27.0–33.0)
MCHC: 33.3 g/dL (ref 32.0–36.0)
MCV: 89.8 fL (ref 80.0–100.0)
MONOS PCT: 9.8 %
MPV: 11.7 fL (ref 7.5–12.5)
Neutro Abs: 2283 cells/uL (ref 1500–7800)
Neutrophils Relative %: 53.1 %
Platelets: 239 10*3/uL (ref 140–400)
RBC: 4.11 10*6/uL (ref 3.80–5.10)
RDW: 13.2 % (ref 11.0–15.0)
Total Lymphocyte: 36.1 %
WBC: 4.3 10*3/uL (ref 3.8–10.8)
WBCMIX: 421 {cells}/uL (ref 200–950)

## 2018-02-15 LAB — PROTEIN / CREATININE RATIO, URINE
Creatinine, Urine: 147 mg/dL (ref 20–275)
Protein/Creat Ratio: 122 mg/g creat (ref 21–161)
Total Protein, Urine: 18 mg/dL (ref 5–24)

## 2018-02-15 LAB — LIPID PANEL
Cholesterol: 144 mg/dL (ref <200)
HDL: 40 mg/dL — ABNORMAL LOW (ref >50)
LDL Cholesterol (Calc): 85 mg/dL (calc)
NON-HDL CHOLESTEROL (CALC): 104 mg/dL (ref <130)
Total CHOL/HDL Ratio: 3.6 (calc) (ref <5.0)
Triglycerides: 98 mg/dL (ref <150)

## 2018-02-15 LAB — HEMOGLOBIN A1C
EAG (MMOL/L): 5.8 (calc)
Hgb A1c MFr Bld: 5.3 % of total Hgb (ref <5.7)
MEAN PLASMA GLUCOSE: 105 (calc)

## 2018-03-03 ENCOUNTER — Encounter: Payer: Self-pay | Admitting: Family Medicine

## 2018-03-03 ENCOUNTER — Ambulatory Visit: Payer: BC Managed Care – PPO | Admitting: Family Medicine

## 2018-03-03 VITALS — BP 134/84 | HR 88 | Temp 98.4°F | Ht 63.0 in | Wt 262.0 lb

## 2018-03-03 DIAGNOSIS — J069 Acute upper respiratory infection, unspecified: Secondary | ICD-10-CM | POA: Diagnosis not present

## 2018-03-03 NOTE — Progress Notes (Signed)
Chief Complaint  Patient presents with  . Nasal Congestion    and chest congestin x 1 week. Eyes have been crusty over the weekend but used some eye drops and that helped. Is coughing some but somewhat better today. No ear pain, low grade temps this weekend. 99.3. Has tried mucinex and has taken sinus pills (nighttime).     10 days ago she felt chilled, then developed sniffles, sore throat, nasal congestion.  It is "kind of getting better"--sore throat and nasal congestion went away, but came back some.  She developed a cough over the last 4 days. She had low grade fever last week, none since.  Nasal drainage has been bloody, slightly yellowish, not getting much drainage, that improved.  Once the nasal drainage stopped, she started coughing.  Phlegm has been yellowish. Today the cough is nonproductive.   Some chest tightness, denies shortness of breath.  Coughs more with strong scents, or if she gets hot or with activity.  No h/o asthma.  OTC meds:  Generic mucinex (helped loosen the mucus), also took a "cold, fever, sore throat" medication (taking at night, not taken today), and cold ease cough drops, which helps.    PMH, PSH, SH reviewed Pt with HIV, compliant with meds Outpatient Encounter Medications as of 03/03/2018  Medication Sig  . GENVOYA 150-150-200-10 MG TABS tablet TAKE ONE TABLET BY MOUTH ONCE DAILY WITH BREAKFAST. STORE IN ORIGINALCONTAINER AT ROOM TEMPERATURE.  . diphenhydrAMINE (BENADRYL) 25 MG tablet Take 25 mg by mouth every 6 (six) hours as needed for itching or allergies. Reported on 02/29/2016  . ibuprofen (ADVIL,MOTRIN) 800 MG tablet Take 0.5-1 tablets (400-800 mg total) by mouth every 8 (eight) hours as needed for mild pain, moderate pain or cramping. (Patient not taking: Reported on 03/03/2018)   No facility-administered encounter medications on file as of 03/03/2018.    Allergies  Allergen Reactions  . Oxycodone     Questionable mouth itching, sweats, but was also  simultaneously on Cipro, Flagyl, and Robaxin.  [name] Kitchen Red Dye   . Amoxicillin Rash  . Aspirin Anxiety  . Sulfamethoxazole-Trimethoprim Rash    ROS:  No nausea, vomiting, diarrhea, skin rashes, bleeding, bruising.  Denies depression.  URI symptoms as per HPI.  No exertional chest pain or shortness of breath.  Denies headaches, dizziness, syncope.  PHYSICAL EXAM: BP 134/84   Pulse 88   Temp 98.4 F (36.9 C) (Tympanic)   Ht 5\' 3"  (1.6 m)   Wt 262 lb (118.8 kg)   LMP 01/22/2018 (Exact Date)   BMI 46.41 kg/m   Well appearing, pleasant obese female in no distress. Some throat-clearing and dry cough during visit. Speaking easily, in no distress HEENT: PERRL, EOMI, conjunctiva and sclera are clear. Nasal mucosa mild-mod edema, pale, slight yellow-green mucus/crust left side. Sinuses are nontender. OP is clear Neck: no lymphadenopathy or mass Heart: regular rate and rhythm Lungs: clear bilaterally, no wheezes, rales, or ronchi, no cough with forced expiration Skin: normal turgor, no rash Psych: normal mood, affect, hygiene and grooming Neuro: alert and oriented, cranial nerves intact, normal gait  ASSESSMENT/PLAN:  Viral upper respiratory infection - supportive measures reviewed, and s/sx bacterial infection reviewed--to contact us if develops   Drink plenty of water--this helps keep the mucus thin. Elevate your head of your bed to help decrease postnasal drainage.. Sinus rinses can help also--once or twice daily (be sure to use distilled or boiled water).  I don't see evidence of a bacterial infection.  I think  you have a virus that is moving from the head down into your chest, along with some possible allergies.  Continue to use Guaifenesin (expectorant in mucinex, to loosen up the phlegm). You may also use dextromethorphan (cough suppressant), as needed for cough. Decongestants such as  Phenylephrine or pseudoephedrine will help dry up the drainage. Antihistamines can also help with  any allergy component (claritin, allegra, zyrtec).  These ingredient are found in many medications, be sure not to take various ones that might overlap ingredients. (acetaminophen and dextromethorphan are the most common ones to be in multiple combination medications).  I suggest taking plain sudafed, mucinex DM or Robitussin DM, and your usual allergy medication.  If you develop recurrent fever, persistent discolored mucus, sinus pain or other problems, please let us know.

## 2018-03-03 NOTE — Patient Instructions (Signed)
  Drink plenty of water--this helps keep the mucus thin. Elevate your head of your bed to help decrease postnasal drainage.. Sinus rinses can help also--once or twice daily (be sure to use distilled or boiled water).  I don't see evidence of a bacterial infection.  I think you have a virus that is moving from the head down into your chest, along with some possible allergies.  Continue to use Guaifenesin (expectorant in mucinex, to loosen up the phlegm). You may also use dextromethorphan (cough suppressant), as needed for cough. Decongestants such as  Phenylephrine or pseudoephedrine will help dry up the drainage. Antihistamines can also help with any allergy component (claritin, allegra, zyrtec).  These ingredient are found in many medications, be sure not to take various ones that might overlap ingredients. (acetaminophen and dextromethorphan are the most common ones to be in multiple combination medications).  I suggest taking plain sudafed, mucinex DM or Robitussin DM, and your usual allergy medication.  If you develop recurrent fever, persistent discolored mucus, sinus pain or other problems, please let us know.

## 2018-03-16 ENCOUNTER — Encounter: Payer: Self-pay | Admitting: Family Medicine

## 2018-03-17 LAB — HIV-1 RNA QUANT-NO REFLEX-BLD

## 2018-03-25 ENCOUNTER — Encounter: Payer: Self-pay | Admitting: *Deleted

## 2018-03-25 ENCOUNTER — Encounter (INDEPENDENT_AMBULATORY_CARE_PROVIDER_SITE_OTHER): Payer: Self-pay | Admitting: *Deleted

## 2018-03-25 VITALS — BP 119/81 | HR 75 | Temp 97.7°F | Wt 258.5 lb

## 2018-03-25 DIAGNOSIS — Z006 Encounter for examination for normal comparison and control in clinical research program: Secondary | ICD-10-CM

## 2018-03-25 LAB — COMPREHENSIVE METABOLIC PANEL
AG Ratio: 2.2 (calc) (ref 1.0–2.5)
ALKALINE PHOSPHATASE (APISO): 109 U/L (ref 33–115)
ALT: 12 U/L (ref 6–29)
AST: 15 U/L (ref 10–35)
Albumin: 4.1 g/dL (ref 3.6–5.1)
BUN: 14 mg/dL (ref 7–25)
CHLORIDE: 110 mmol/L (ref 98–110)
CO2: 26 mmol/L (ref 20–32)
Calcium: 9.1 mg/dL (ref 8.6–10.2)
Creat: 0.86 mg/dL (ref 0.50–1.10)
GLOBULIN: 1.9 g/dL (ref 1.9–3.7)
Glucose, Bld: 104 mg/dL — ABNORMAL HIGH (ref 65–99)
Potassium: 4.3 mmol/L (ref 3.5–5.3)
Sodium: 141 mmol/L (ref 135–146)
Total Bilirubin: 0.3 mg/dL (ref 0.2–1.2)
Total Protein: 6 g/dL — ABNORMAL LOW (ref 6.1–8.1)

## 2018-03-25 NOTE — Progress Notes (Signed)
Margaret Porter is here for week 2 visit for [facility] Study: Decay of HIV-1 Reservoirs in Subjects on long-term ARV's, an observational study. States that her seasonal allergies are starting to flare up. Using OTC medication for symptom relief. Next study visit 06/11/18 at 9:00am.

## 2018-04-05 LAB — HIV-1 RNA QUANT-NO REFLEX-BLD

## 2018-04-09 ENCOUNTER — Encounter: Payer: Self-pay | Admitting: *Deleted

## 2018-05-23 ENCOUNTER — Other Ambulatory Visit: Payer: Self-pay | Admitting: Internal Medicine

## 2018-05-23 DIAGNOSIS — B20 Human immunodeficiency virus [HIV] disease: Secondary | ICD-10-CM

## 2018-06-11 ENCOUNTER — Encounter: Payer: Self-pay | Admitting: Internal Medicine

## 2018-06-11 ENCOUNTER — Encounter (INDEPENDENT_AMBULATORY_CARE_PROVIDER_SITE_OTHER): Payer: Self-pay | Admitting: *Deleted

## 2018-06-11 VITALS — BP 128/83 | HR 64 | Temp 98.0°F | Wt 265.8 lb

## 2018-06-11 DIAGNOSIS — Z006 Encounter for examination for normal comparison and control in clinical research program: Secondary | ICD-10-CM

## 2018-06-11 NOTE — Progress Notes (Signed)
Margaret Porter is here for her week 29 visit for A5322, [facility] Study: A Long Term follow-up of Older HIV-Infected Adults in the ACTG, an observational study addressing the issues of aging, HIV infection and Inflammation. She denied any new medications but states that she has noticed that her (L) arm/leg are much larger in size than her (R) side. Stated that she first noticed this about 2 months ago. Denies any discomfort other than she thinks she "tweaked" her knee and it's been bothering her. I did tell her to follow-up with her PCP to have this evaluated. She will return in August for her next study visit.

## 2018-07-10 ENCOUNTER — Encounter: Payer: Self-pay | Admitting: *Deleted

## 2018-07-10 LAB — HIV-1 RNA QUANT-NO REFLEX-BLD: HIV-1 RNA Viral Load: <40

## 2018-08-19 ENCOUNTER — Encounter (INDEPENDENT_AMBULATORY_CARE_PROVIDER_SITE_OTHER): Payer: Self-pay | Admitting: *Deleted

## 2018-08-19 ENCOUNTER — Encounter: Payer: Self-pay | Admitting: *Deleted

## 2018-08-19 VITALS — BP 118/82 | HR 68 | Temp 98.3°F | Wt 270.5 lb

## 2018-08-19 DIAGNOSIS — Z006 Encounter for examination for normal comparison and control in clinical research program: Secondary | ICD-10-CM

## 2018-08-19 NOTE — Progress Notes (Signed)
Margaret Porter was here for her week 69 visit for [facility] Study: Decay of HIV-1 Reservoirs in Subjects on long-term ARVs, an observational study. She says she has noticed her left leg swelling up more than her right one. She is going to make an appt. With her regular PCP and let them know about it. She denies any other current problems or any new medications. She will be returning in November for the next A5322 visit.

## 2018-08-20 ENCOUNTER — Encounter: Payer: BC Managed Care – PPO | Admitting: *Deleted

## 2018-08-20 LAB — COMPREHENSIVE METABOLIC PANEL
AG Ratio: 1.9 (calc) (ref 1.0–2.5)
ALT: 16 U/L (ref 6–29)
AST: 16 U/L (ref 10–35)
Albumin: 4.1 g/dL (ref 3.6–5.1)
Alkaline phosphatase (APISO): 113 U/L (ref 33–115)
BILIRUBIN TOTAL: 0.3 mg/dL (ref 0.2–1.2)
BUN: 11 mg/dL (ref 7–25)
CALCIUM: 8.7 mg/dL (ref 8.6–10.2)
CO2: 23 mmol/L (ref 20–32)
Chloride: 105 mmol/L (ref 98–110)
Creat: 0.94 mg/dL (ref 0.50–1.10)
GLUCOSE: 105 mg/dL — AB (ref 65–99)
Globulin: 2.2 g/dL (calc) (ref 1.9–3.7)
Potassium: 4.2 mmol/L (ref 3.5–5.3)
SODIUM: 139 mmol/L (ref 135–146)
TOTAL PROTEIN: 6.3 g/dL (ref 6.1–8.1)

## 2018-08-20 LAB — HEPATITIS C ANTIBODY
Hepatitis C Ab: NONREACTIVE
SIGNAL TO CUT-OFF: 0.01 (ref <1.00)

## 2018-09-03 ENCOUNTER — Encounter: Payer: Self-pay | Admitting: *Deleted

## 2018-09-03 LAB — HIV-1 RNA QUANT-NO REFLEX-BLD
CD4 COUNT: 707
CD8 T CELL SUPPRESSOR: 41.4
CD8 T CELL SUPPRESSOR: 44.2
CD8 T Cell Abs: 662
HIV-1 RNA Viral Load: <40

## 2018-10-20 ENCOUNTER — Other Ambulatory Visit: Payer: Self-pay | Admitting: Internal Medicine

## 2018-10-20 DIAGNOSIS — B20 Human immunodeficiency virus [HIV] disease: Secondary | ICD-10-CM

## 2018-10-28 ENCOUNTER — Encounter (INDEPENDENT_AMBULATORY_CARE_PROVIDER_SITE_OTHER): Payer: BC Managed Care – PPO

## 2018-11-12 ENCOUNTER — Encounter (INDEPENDENT_AMBULATORY_CARE_PROVIDER_SITE_OTHER): Payer: Self-pay | Admitting: Bariatrics

## 2018-11-12 ENCOUNTER — Ambulatory Visit (INDEPENDENT_AMBULATORY_CARE_PROVIDER_SITE_OTHER): Payer: BC Managed Care – PPO | Admitting: Bariatrics

## 2018-11-12 VITALS — BP 118/81 | HR 82 | Temp 97.8°F | Ht 63.0 in | Wt 262.0 lb

## 2018-11-12 DIAGNOSIS — R0602 Shortness of breath: Secondary | ICD-10-CM | POA: Diagnosis not present

## 2018-11-12 DIAGNOSIS — R7309 Other abnormal glucose: Secondary | ICD-10-CM

## 2018-11-12 DIAGNOSIS — Z0289 Encounter for other administrative examinations: Secondary | ICD-10-CM

## 2018-11-12 DIAGNOSIS — Z9189 Other specified personal risk factors, not elsewhere classified: Secondary | ICD-10-CM

## 2018-11-12 DIAGNOSIS — Z1331 Encounter for screening for depression: Secondary | ICD-10-CM

## 2018-11-12 DIAGNOSIS — R5383 Other fatigue: Secondary | ICD-10-CM

## 2018-11-12 DIAGNOSIS — E559 Vitamin D deficiency, unspecified: Secondary | ICD-10-CM | POA: Diagnosis not present

## 2018-11-12 DIAGNOSIS — Z6841 Body Mass Index (BMI) 40.0 and over, adult: Secondary | ICD-10-CM

## 2018-11-12 DIAGNOSIS — B20 Human immunodeficiency virus [HIV] disease: Secondary | ICD-10-CM | POA: Diagnosis not present

## 2018-11-13 ENCOUNTER — Ambulatory Visit: Payer: BC Managed Care – PPO | Admitting: Family Medicine

## 2018-11-13 ENCOUNTER — Encounter: Payer: Self-pay | Admitting: Family Medicine

## 2018-11-13 VITALS — BP 120/70 | HR 72 | Temp 100.3°F | Ht 63.0 in | Wt 268.0 lb

## 2018-11-13 DIAGNOSIS — R05 Cough: Secondary | ICD-10-CM | POA: Diagnosis not present

## 2018-11-13 DIAGNOSIS — R52 Pain, unspecified: Secondary | ICD-10-CM | POA: Diagnosis not present

## 2018-11-13 DIAGNOSIS — J40 Bronchitis, not specified as acute or chronic: Secondary | ICD-10-CM

## 2018-11-13 DIAGNOSIS — R509 Fever, unspecified: Secondary | ICD-10-CM

## 2018-11-13 DIAGNOSIS — R059 Cough, unspecified: Secondary | ICD-10-CM

## 2018-11-13 DIAGNOSIS — R6883 Chills (without fever): Secondary | ICD-10-CM

## 2018-11-13 DIAGNOSIS — J329 Chronic sinusitis, unspecified: Secondary | ICD-10-CM | POA: Diagnosis not present

## 2018-11-13 LAB — CBC WITH DIFFERENTIAL/PLATELET
BASOS ABS: 0 10*3/uL (ref 0.0–0.2)
Basos: 0 %
EOS (ABSOLUTE): 0 10*3/uL (ref 0.0–0.4)
Eos: 0 %
Hematocrit: 42.9 % (ref 34.0–46.6)
Hemoglobin: 14.1 g/dL (ref 11.1–15.9)
Immature Grans (Abs): 0 10*3/uL (ref 0.0–0.1)
Immature Granulocytes: 0 %
LYMPHS ABS: 0.9 10*3/uL (ref 0.7–3.1)
Lymphs: 17 %
MCH: 30.9 pg (ref 26.6–33.0)
MCHC: 32.9 g/dL (ref 31.5–35.7)
MCV: 94 fL (ref 79–97)
MONOS ABS: 0.7 10*3/uL (ref 0.1–0.9)
Monocytes: 15 %
NEUTROS ABS: 3.3 10*3/uL (ref 1.4–7.0)
Neutrophils: 68 %
PLATELETS: 257 10*3/uL (ref 150–450)
RBC: 4.56 x10E6/uL (ref 3.77–5.28)
RDW: 13.2 % (ref 12.3–15.4)
WBC: 5 10*3/uL (ref 3.4–10.8)

## 2018-11-13 LAB — HEMOGLOBIN A1C
ESTIMATED AVERAGE GLUCOSE: 108 mg/dL
HEMOGLOBIN A1C: 5.4 % (ref 4.8–5.6)

## 2018-11-13 LAB — VITAMIN B12: VITAMIN B 12: 348 pg/mL (ref 232–1245)

## 2018-11-13 LAB — TSH: TSH: 2.17 u[IU]/mL (ref 0.450–4.500)

## 2018-11-13 LAB — FOLATE: Folate: 7.5 ng/mL (ref >3.0)

## 2018-11-13 LAB — INSULIN, RANDOM: INSULIN: 21.1 u[IU]/mL (ref 2.6–24.9)

## 2018-11-13 LAB — POC INFLUENZA A&B (BINAX/QUICKVUE)
INFLUENZA A, POC: NEGATIVE
INFLUENZA B, POC: NEGATIVE

## 2018-11-13 LAB — T4, FREE: FREE T4: 1.29 ng/dL (ref 0.82–1.77)

## 2018-11-13 LAB — VITAMIN D 25 HYDROXY (VIT D DEFICIENCY, FRACTURES): Vit D, 25-Hydroxy: 10 ng/mL — ABNORMAL LOW (ref 30.0–100.0)

## 2018-11-13 LAB — T3: T3, Total: 135 ng/dL (ref 71–180)

## 2018-11-13 MED ORDER — AZITHROMYCIN 250 MG PO TABS: ORAL_TABLET | ORAL | 0 refills | Status: DC

## 2018-11-13 NOTE — Patient Instructions (Signed)
  Drink plenty of water. Continue guaifenesin--this is the expectorant found in many cough medications. Dextromethorphan is a a cough suppressant found in many cough  Medications (DM found in DM products, also in Delsym). You may use acetaminophen (tylenol) as needed for pain or fever (if not in your medication at home). You may also use ibuprofen (advil/motrin) if needed for pain or fever.  We are treating you with an antibiotic to cover for a bacterial cause for your symptoms.  Take the z-pak as directed, remembering that it continues to work for 10 days, even though you only take it for 5.  If you still have fever and are zero better (or worse) by the 4th or 5th day of the antibiotic, please contact us.

## 2018-11-13 NOTE — Progress Notes (Signed)
Office: [phone]  /  Fax: [phone]   Dear Dr. Garwin Brothers,   Thank you for referring Margaret Porter to our clinic. The following note includes my evaluation and treatment recommendations.  HPI:   Chief Complaint: OBESITY    [name] has been referred by Sheronette A. Garwin Brothers, MD for consultation regarding her obesity and obesity related comorbidities.    Margaret Porter (MR# [id number]) is a 48 y.o. female who presents on 11/13/2018 for obesity evaluation and treatment. Current BMI is Body mass index is 46.41 kg/m.Margaret Porter has been struggling with her weight for many years and has been unsuccessful in either losing weight, maintaining weight loss, or reaching her healthy weight goal.     [name] attended our information session and states she is currently in the action stage of change and ready to dedicate time achieving and maintaining a healthier weight. [name] is interested in becoming our patient and working on intensive lifestyle modifications including (but not limited to) diet, exercise and weight loss.    [name] states her family eats meals together she thinks her family will eat healthier with  her her desired weight loss is 87 lbs she started gaining weight in her 40's her heaviest weight ever was 267 lbs. she states that she is a picky eater  she states that she craves ice cream and anything strawberry she sometimes snacks at night she skips meals frequently she is frequently drinking liquids with calories   Fatigue [name] feels her energy is lower than it should be. This has worsened with weight gain and has not worsened recently. [name] admits to daytime somnolence and she admits to waking up still tired. Patient is at risk for obstructive sleep apnea. Patent has a history of symptoms of daytime fatigue and morning fatigue. Patient generally gets 4 or 5 hours of sleep per night, and states they have trouble sleeping (stays up too late and  doesn't want to get up). Snoring is present. Apneic episodes are not present. Epworth Sleepiness Score is 7  Dyspnea on exertion [name] notes increasing shortness of breath with exercising and seems to be worsening over time with weight gain. She notes getting out of breath sooner with certain activities than she used to (walking fast). This has not gotten worse recently. [name] denies orthopnea.  Vitamin D deficiency [name] has a diagnosis of vitamin D deficiency. She is taking OTC vit D occasionally and denies nausea, vomiting or muscle weakness.  HIV [name] is taking chronic medications, CD4 is undetectable. Hepatitis  C is non reactive, viral load is < 40. She denies nausea or vomiting.  Elevated Glucose [name] has a history of elevated blood glucose readings without a diagnosis of diabetes. She denies polyphagia.  At risk for diabetes [name] is at higher than average risk for developing diabetes due to her obesity and elevated glucose. She currently denies polyuria or polydipsia.  Depression Screen [name]'s Food and Mood (modified PHQ-9) score was  Depression screen PHQ 2/9 11/12/2018  Decreased Interest 1  Down, Depressed, Hopeless 1  PHQ - 2 Score 2  Altered sleeping 1  Tired, decreased energy 1  Change in appetite 0  Feeling bad or failure about yourself  0  Trouble concentrating 0  Moving slowly or fidgety/restless 0  Suicidal thoughts 0  PHQ-9 Score 4  Difficult doing work/chores Not difficult at all    ALLERGIES: Allergies  Allergen Reactions  . Oxycodone     Questionable mouth itching, sweats, but was also simultaneously  on Cipro, Flagyl, and Robaxin.  Margaret Kitchen Red Dye   . Amoxicillin Rash  . Aspirin Anxiety  . Sulfamethoxazole-Trimethoprim Rash    MEDICATIONS: Current Outpatient Medications on File Prior to Visit  Medication Sig Dispense Refill  . diphenhydrAMINE (BENADRYL) 25 MG tablet Take 25 mg by mouth every 6 (six) hours as needed for itching  or allergies. Reported on 02/29/2016    . GENVOYA 150-150-200-10 MG TABS tablet TAKE ONE TABLET BY MOUTH ONCE DAILY WITH BREAKFAST. STORE IN ORIGINALCONTAINER AT ROOM TEMPERATURE. 30 tablet 2   No current facility-administered medications on file prior to visit.     PAST MEDICAL HISTORY: Past Medical History:  Diagnosis Date  . Allergic rhinitis   . Anemia   . Food allergy   . HIV infection ([place]) 2006  . Idiopathic urticaria   . Joint pain   . Obesity   . Pre-diabetes   . Rosacea   . Swelling of both lower extremities   . Vitamin D deficiency     PAST SURGICAL HISTORY: Past Surgical History:  Procedure Laterality Date  . APPENDECTOMY    . DILATATION & CURRETTAGE/HYSTEROSCOPY WITH RESECTOCOPE N/A 06/27/2015   Procedure: DILATATION & CURETTAGE/HYSTEROSCOPY WITH RESECTOCOPE;  Surgeon: Servando Salina, MD;  Location: [facility];  Service: Gynecology;  Laterality: N/A;  . LAPAROSCOPIC APPENDECTOMY N/A 09/07/2017   Procedure: APPENDECTOMY LAPAROSCOPIC;  Surgeon: Clovis Riley, MD;  Location: [facility];  Service: General;  Laterality: N/A;    SOCIAL HISTORY: Social History   Tobacco Use  . Smoking status: Never Smoker  . Smokeless tobacco: Never Used  Substance Use Topics  . Alcohol use: Yes    Alcohol/week: 0.0 standard drinks    Comment: occasional (special occasions)  . Drug use: No    FAMILY HISTORY: Family History  Problem Relation Age of Onset  . Diabetes Mother   . Diabetes Brother   . Heart disease Paternal Uncle   . Heart disease Maternal Grandmother   . Cancer Maternal Grandmother   . Heart disease Paternal Grandmother   . Diabetes Father   . High blood pressure Father     ROS: Review of Systems  Constitutional: Positive for malaise/fatigue.       + Fever / Chills + Breasts Pain  HENT: Positive for congestion (nasal stuffiness).        + Hoarseness  Eyes: Positive for redness.       + Vision Changes + Wear Glasses or Contacts + Blurry or Double Vision    Respiratory: Positive for cough, shortness of breath (with activity) and wheezing.   Cardiovascular: Negative for orthopnea.       + Very Cold Feet or Hands  Gastrointestinal: Negative for nausea and vomiting.  Genitourinary: Positive for frequency.  Skin: Positive for itching and rash.       + Dryness  Endo/Heme/Allergies: Negative for polydipsia. Bruises/bleeds easily (bruising).       Negative for polyphagia  Psychiatric/Behavioral: The patient has insomnia.     PHYSICAL EXAM: Blood pressure 118/81, pulse 82, temperature 97.8 F (36.6 C), temperature source Oral, height 5\' 3"  (1.6 m), weight 262 lb (118.8 kg), last menstrual period 10/19/2018, SpO2 100 %. Body mass index is 46.41 kg/m. Physical Exam  Constitutional: She is oriented to person, place, and time. She appears well-developed and well-nourished.  HENT:  Head: Normocephalic and atraumatic.  Nose: Nose normal.  Mallampati = 3  Eyes: EOM are normal. No scleral icterus.  Neck: Normal range of motion. Neck supple. No  thyromegaly present.  Cardiovascular: Normal rate and regular rhythm.  Pulmonary/Chest: Effort normal. No respiratory distress.  Abdominal: Soft. There is no tenderness.  + Obesity  Musculoskeletal: Normal range of motion. She exhibits edema (trace edema bilateral lower extremities).  Neurological: She is alert and oriented to person, place, and time. Coordination normal.  Skin: Skin is warm and dry.  Psychiatric: She has a normal mood and affect.  Vitals reviewed.   RECENT LABS AND TESTS: BMET    Component Value Date/Time   NA 139 08/19/2018 0940   K 4.2 08/19/2018 0940   CL 105 08/19/2018 0940   CO2 23 08/19/2018 0940   GLUCOSE 105 (H) 08/19/2018 0940   BUN 11 08/19/2018 0940   CREATININE 0.94 08/19/2018 0940   CALCIUM 8.7 08/19/2018 0940   GFRNONAA >60 09/10/2017 0317   GFRAA >60 09/10/2017 0317   Lab Results  Component Value Date   HGBA1C 5.4 11/12/2018   Lab Results  Component Value  Date   INSULIN 21.1 11/12/2018   CBC    Component Value Date/Time   WBC 5.0 11/12/2018 0938   WBC 4.3 02/14/2018 1015   RBC 4.56 11/12/2018 0938   RBC 4.11 02/14/2018 1015   HGB 14.1 11/12/2018 0938   HCT 42.9 11/12/2018 0938   PLT 257 11/12/2018 0938   MCV 94 11/12/2018 0938   MCH 30.9 11/12/2018 0938   MCH 29.9 02/14/2018 1015   MCHC 32.9 11/12/2018 0938   MCHC 33.3 02/14/2018 1015   RDW 13.2 11/12/2018 0938   LYMPHSABS 0.9 11/12/2018 0938   MONOABS 0.5 02/13/2016 0923   EOSABS 0.0 11/12/2018 0938   BASOSABS 0.0 11/12/2018 0938   Iron/TIBC/Ferritin/ %Sat No results found for: IRON, TIBC, FERRITIN, IRONPCTSAT Lipid Panel     Component Value Date/Time   CHOL 144 02/14/2018 1015   TRIG 98 02/14/2018 1015   HDL 40 (L) 02/14/2018 1015   CHOLHDL 3.6 02/14/2018 1015   VLDL 24 04/04/2017 0935   LDLCALC 85 02/14/2018 1015   Hepatic Function Panel     Component Value Date/Time   PROT 6.3 08/19/2018 0940   ALBUMIN 3.4 (L) 09/07/2017 1638   AST 16 08/19/2018 0940   ALT 16 08/19/2018 0940   ALKPHOS 102 09/07/2017 1638   BILITOT 0.3 08/19/2018 0940   BILIDIR 0.1 12/22/2015 0001   IBILI 0.3 12/22/2015 0001      Component Value Date/Time   TSH 2.170 11/12/2018 0938   TSH 1.985 11/29/2014 1029    ECG  shows NSR with a rate of 84 BPM INDIRECT CALORIMETER done today shows a VO2 of 299 and a REE of 2080.  Her calculated basal metabolic rate is 7989 thus her basal metabolic rate is better than expected.    ASSESSMENT AND PLAN: Other fatigue - Plan: EKG 12-Lead, Vitamin B12, Folate, T3, T4, free, TSH  Shortness of breath on exertion  Vitamin D deficiency - Plan: VITAMIN D 25 Hydroxy (Vit-D Deficiency, Fractures)  Human immunodeficiency virus (HIV) disease (HCC) - Plan: CBC with Differential/Platelet  Elevated glucose - Plan: Hemoglobin A1c, Insulin, random  At risk for diabetes mellitus  Screening for depression  Class 3 severe obesity with serious comorbidity  and body mass index (BMI) of 45.0 to 49.9 in adult, unspecified obesity type ([place])  PLAN: Fatigue Darielle was informed that her fatigue may be related to obesity, depression or many other causes. Labs will be ordered, and in the meanwhile Elnora has agreed to work on diet, exercise and weight loss to  help with fatigue. Proper sleep hygiene was discussed including the need for 7-8 hours of quality sleep each night. A sleep study was not ordered based on symptoms and Epworth score.  Dyspnea on exertion Hermie's shortness of breath appears to be obesity related and exercise induced. She has agreed to work on weight loss and gradually increase exercise to treat her exercise induced shortness of breath. If Kaylan follows our instructions and loses weight without improvement of her shortness of breath, we will plan to refer to pulmonology. We will monitor this condition regularly. Isatou agrees to this plan.  Vitamin D Deficiency Brinleigh was informed that low vitamin D levels contributes to fatigue and are associated with obesity, breast, and colon cancer. She will continue to take OTC Vit D and will follow up for routine testing of vitamin D, at least 2-3 times per year. She was informed of the risk of over-replacement of vitamin D and agrees to not increase her dose unless she discusses this with [name] first. We will check vitamin D level and Michiah will follow up at the agreed upon time.  HIV Marene will continue to follow up with infectious disease (in a study to do CD4 and enzymes every 2 to 3 months) and she will follow up with our clinic in 2 weeks.  Elevated Glucose We will check Hgb A1c and fasting insulin and results with be discussed with Avacyn in 2 weeks at her follow up visit. In the meanwhile Kialee was started on a lower simple carbohydrate diet and will work on weight loss efforts.  Diabetes risk counseling Simar was given extended (15 minutes) diabetes prevention  counseling today. She is 48 y.o. female and has risk factors for diabetes including obesity and elevated glucose. We discussed intensive lifestyle modifications today with an emphasis on weight loss as well as increasing exercise and decreasing simple carbohydrates in her diet.  Depression Screen Serenitee had a negative depression screening. Depression is commonly associated with obesity and often results in emotional eating behaviors. We will monitor this closely and work on CBT to help improve the non-hunger eating patterns. Referral to Psychology may be required if no improvement is seen as she continues in our clinic.  Obesity Emilene is currently in the action stage of change and her goal is to continue with weight loss efforts. I recommend Loral begin the structured treatment plan as follows:  She has agreed to follow the modified Category 3 plan [place] has been instructed to eventually work up to a goal of 150 minutes of combined cardio and strengthening exercise per week for weight loss and overall health benefits. We discussed the following Behavioral Modification Strategies today: cut back on sugary drinks, increase H2O intake, no skipping meals, increasing lean protein intake, decreasing simple carbohydrates , increasing vegetables and decrease eating out   She was informed of the importance of frequent follow up visits to maximize her success with intensive lifestyle modifications for her multiple health conditions. She was informed we would discuss her lab results at her next visit unless there is a critical issue that needs to be addressed sooner. Elzie agreed to keep her next visit at the agreed upon time to discuss these results.    OBESITY BEHAVIORAL INTERVENTION VISIT  Today's visit was # 1   Starting weight: 268 lbs Starting date: 11/12/18 Today's weight : 268 lbs Today's date: 11/12/2018 Total lbs lost to date: 0   ASK: We discussed the diagnosis of obesity  with Judieth Keens  today and Tayvia agreed to give [name] permission to discuss obesity behavioral modification therapy today.  ASSESS: Deshea has the diagnosis of obesity and her BMI today is 47.49 Reatha is in the action stage of change   ADVISE: Takeya was educated on the multiple health risks of obesity as well as the benefit of weight loss to improve her health. She was advised of the need for long term treatment and the importance of lifestyle modifications to improve her current health and to decrease her risk of future health problems.  AGREE: Multiple dietary modification options and treatment options were discussed and  Daquisha agreed to follow the recommendations documented in the above note.  ARRANGE: Shenay was educated on the importance of frequent visits to treat obesity as outlined per CMS and USPSTF guidelines and agreed to schedule her next follow up appointment today.  Corey Skains, am acting as Location manager for [employer]. Owens Shark, DO  I have reviewed the above documentation for accuracy and completeness, and I agree with the above. -Jearld Lesch, DO

## 2018-11-13 NOTE — Progress Notes (Signed)
Chief Complaint  Patient presents with  . Fever    chills, cough and flu like symptoms that started this Tuesday. Mucus is green in color. Highest fever has been is 100.    Started with sinus congestion last week.  It got better, but 2 days ago it started getting worse again.  She started with fever, chills, cough, headaches.  Pain is across the forehead, worse on the left, started last night.  Nasal drainage is clear.  Slight sore throat.  Occasional postnasal drainage--expectorated green-yellow phlegm.  Cough is mostly dry, sometimes gets up yellow-green phlegm, when she coughs very deep.  Can feel a little short of breath after a coughing spell. Yesterday she felt very tired at work.  Denies dyspnea with exertion. 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  She has been taking OTC meds for "cough, cold and flu", and also a mucinex for congestion.  Hasn't taken anything today.  They helped with some of the nasal congestion.  PMH, PSH, SH reviewed HIV+  Outpatient Encounter Medications as of 11/13/2018  Medication Sig  . diphenhydrAMINE (BENADRYL) 25 MG tablet Take 25 mg by mouth every 6 (six) hours as needed for itching or allergies. Reported on 02/29/2016  . GENVOYA 150-150-200-10 MG TABS tablet TAKE ONE TABLET BY MOUTH ONCE DAILY WITH BREAKFAST. STORE IN ORIGINALCONTAINER AT ROOM TEMPERATURE.   No facility-administered encounter medications on file as of 11/13/2018.    Allergies  Allergen Reactions  . Oxycodone     Questionable mouth itching, sweats, but was also simultaneously on Cipro, Flagyl, and Robaxin.  [name] Kitchen Red Dye   . Amoxicillin Rash  . Aspirin Anxiety  . Sulfamethoxazole-Trimethoprim Rash   ROS: +fever, chills, headaches, cough. No nausea, vomiting, diarrhea.   No bleeding, bruising, rash. Denies severe myalgias. No other complaints. See HPI   PHYSICAL EXAM:  BP 120/70   Pulse 72   Temp 100.3 F (37.9 C) (Tympanic)   Ht 5\' 3"  (1.6  m)   Wt 268 lb (121.6 kg)   LMP 10/19/2018   BMI 47.47 kg/m   Well appeaing ,though somewhat tired, female in no distress. Occasional dry coughing spells. HEENT: PERRL, EOMI, conjunctiva and sclera are clear. TM's and EAC's are normal. Nasal mucosa is moderately edematous, somewhat pale. There is white mucus present in the nares.  Sinuses are nontender.  OP is clear mild erythema of anterior tonsillar pillars bilateraly. Moist mucus membranes Neck: no lymphadenopathy or mass Heart: regular rate and rhythm, no murmur Lungs: clear bilaterally, no wheezes, rales, ronchi Skin: normal turgor, no rash Psych: normal mood, affect, hygiene and grooming Neuro: alert and oriented, cranial nerves intact normal gait.   Flu test negative for A&B   ASSESSMENT/PLAN:  Sinobronchitis - pt HIV+ with URI sx last week which resolved and recurred, now worse. Cover for bacterial infection. Supportive measures reviewed, risks/SE meds - Plan: azithromycin (ZITHROMAX) 250 MG tablet  Fever, unspecified fever cause - Plan: POC Influenza A&B (Binax test)  Chills - Plan: POC Influenza A&B (Binax test)  Cough - Plan: POC Influenza A&B (Binax test)  Body aches - Plan: POC Influenza A&B (Binax test)   Pt states she has tolerated zpak in the past (no issue with red dye).  Drink plenty of water. Continue guaifenesin--this is the expectorant found in many cough medications. Dextromethorphan is a a cough suppressant found in many cough  Medications (DM found in DM products, also in Delsym). You may use acetaminophen (tylenol) as  needed for pain or fever (if not in your medication at home). You may also use ibuprofen (advil/motrin) if needed for pain or fever.  We are treating you with an antibiotic to cover for a bacterial cause for your symptoms.  Take the z-pak as directed, remembering that it continues to work for 10 days, even though you only take it for 5.  If you still have fever and are zero better (or  worse) by the 4th or 5th day of the antibiotic, please contact us.

## 2018-11-15 ENCOUNTER — Encounter: Payer: Self-pay | Admitting: Family Medicine

## 2018-11-19 MED ORDER — BENZONATATE 200 MG PO CAPS: 200.0000 mg | ORAL_CAPSULE | Freq: Three times a day (TID) | ORAL | 0 refills | Status: DC | PRN

## 2018-11-26 ENCOUNTER — Encounter: Payer: Self-pay | Admitting: Family Medicine

## 2018-11-26 ENCOUNTER — Encounter: Payer: Self-pay | Admitting: Internal Medicine

## 2018-11-26 ENCOUNTER — Ambulatory Visit (INDEPENDENT_AMBULATORY_CARE_PROVIDER_SITE_OTHER): Payer: BC Managed Care – PPO | Admitting: Bariatrics

## 2018-11-26 ENCOUNTER — Encounter (INDEPENDENT_AMBULATORY_CARE_PROVIDER_SITE_OTHER): Payer: Self-pay | Admitting: Bariatrics

## 2018-11-26 ENCOUNTER — Ambulatory Visit: Payer: BC Managed Care – PPO | Admitting: Family Medicine

## 2018-11-26 VITALS — BP 107/66 | HR 61 | Temp 98.5°F | Ht 63.0 in | Wt 254.0 lb

## 2018-11-26 VITALS — BP 110/80 | HR 65 | Temp 98.1°F | Ht 63.0 in | Wt 259.4 lb

## 2018-11-26 DIAGNOSIS — R05 Cough: Secondary | ICD-10-CM

## 2018-11-26 DIAGNOSIS — Z9189 Other specified personal risk factors, not elsewhere classified: Secondary | ICD-10-CM | POA: Diagnosis not present

## 2018-11-26 DIAGNOSIS — R059 Cough, unspecified: Secondary | ICD-10-CM

## 2018-11-26 DIAGNOSIS — J309 Allergic rhinitis, unspecified: Secondary | ICD-10-CM

## 2018-11-26 DIAGNOSIS — Z6841 Body Mass Index (BMI) 40.0 and over, adult: Secondary | ICD-10-CM

## 2018-11-26 DIAGNOSIS — E8881 Metabolic syndrome: Secondary | ICD-10-CM

## 2018-11-26 DIAGNOSIS — E559 Vitamin D deficiency, unspecified: Secondary | ICD-10-CM

## 2018-11-26 DIAGNOSIS — E88819 Insulin resistance, unspecified: Secondary | ICD-10-CM

## 2018-11-26 MED ORDER — VITAMIN D (ERGOCALCIFEROL) 1.25 MG (50000 UNIT) PO CAPS: 50000.0000 [IU] | ORAL_CAPSULE | ORAL | 0 refills | Status: DC

## 2018-11-26 NOTE — Progress Notes (Signed)
Chief Complaint  Patient presents with  . Cough    chest congetion x2 weeks     Treated with z-pak for sinobronchitis on 11/14. 11/16 reported that her fever had broken. She also reported then that her cough was worse with movement and when hot.  Phlegm had gotten lighter, back to clear. She had pain at her left lower ribs, lasted just 5-10 mins and resolved. Had a bowel movement shortly after, wonders if constipation could have contributed.  Hasn't recurred.  She has persistent cough. Feels like she is worse at work than at home, has known mold allergy and building is very old. Nasal congestion is much better. Cough is mostly dry, hacky, having some voice hoarseness.  Only occasionally gets up phlegm, sometimes is clear, sometimes is green--green just in the morning, clear throughout the day. Denies sinus pain or headaches.  No recurrent fever or chills (not since it originally broke, shortly after the last visit).  Not getting worse, just not getting better.  Lozenges help, better than the tessalon perles. Short of breath only with a spell of coughing, not with activity.  Taking OTC medication (combination product of mucinex) mainly at bedtime, not taking anything during the day.  PMH, PSH, SH reviewed  Outpatient Encounter Medications as of 11/26/2018  Medication Sig  . benzonatate (TESSALON) 200 MG capsule Take 1 capsule (200 mg total) by mouth 3 (three) times daily as needed.  . GENVOYA 150-150-200-10 MG TABS tablet TAKE ONE TABLET BY MOUTH ONCE DAILY WITH BREAKFAST. STORE IN ORIGINALCONTAINER AT ROOM TEMPERATURE.  . [DISCONTINUED] azithromycin (ZITHROMAX) 250 MG tablet Take 2 tablets by mouth on first day, then 1 tablet by mouth on days 2 through 5  . diphenhydrAMINE (BENADRYL) 25 MG tablet Take 25 mg by mouth every 6 (six) hours as needed for itching or allergies. Reported on 02/29/2016   No facility-administered encounter medications on file as of 11/26/2018.    Allergies   Allergen Reactions  . Molds & Smuts   . Oxycodone     Questionable mouth itching, sweats, but was also simultaneously on Cipro, Flagyl, and Robaxin.  [name] Kitchen Red Dye   . Amoxicillin Rash  . Aspirin Anxiety  . Sulfamethoxazole-Trimethoprim Rash    ROS:  No further fever, no chills. No nausea, vomiting, diarrhea. No bleeding, bruising, rash. No headaches, dizziness, chest pain. +cough. Some sore throat. No ear pain. Denies heartburn. See HPI   PHYSICAL EXAM:  BP 110/80   Pulse 65   Temp 98.1 F (36.7 C) (Oral)   Ht 5\' 3"  (1.6 m)   Wt 259 lb 6.4 oz (117.7 kg)   SpO2 97%   BMI 45.95 kg/m   Well-appearing, pleasant, obese female, in good spirits, with occasional dry cough during visit, mild HEENT: PERRL, EOMI, conjunctiva and sclera are clear. Nasal mucosa is mildly edematous, no erythema, or drainage. OP minimal erythema of anterior tonsillar pillars, less than last visit. Otherwise normal. Sinuses are nontender TM's and EAC's normal. Neck: no lymphadenopathy or mass Heart: regular rate and rhythm, no murmur Lungs: clear bilaterally, no wheezes, rales, ronchi. No coughing with deep breath or forced expiration. Extremities: no edema Psych: normal mood, affect, hygiene and grooming  500/460/440 peak flow    ASSESSMENT/PLAN:   Cough - no RAD/wheezing/pna or e/o bacterial infection. Suspect related to PND. Guaifenesin, DM prn  Allergic rhinitis, unspecified seasonality, unspecified trigger - recommended daily non-sedating anti-histamine.s/sx bacterial infection reviewed, and to contact us if they recur   Normal peak flow  testing, nothing to suggest wheezing or reactive airways.   Your infection seems to have resolved. 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 Take an antihistamine once daily--ie claritin, zyrtec or allegra. This should help treat allergies, and with less postnasal drainage,  there should be less cough. I recommend using Mucinex PLAIN 12 hour, and only using Delsym syrup (12 hour) as needed for cough. You can continue the tessalon if needed, but I think continuing lozenges are also a good idea. If you have recurrent fever, or if the phlegm becomes more consistently discolored, then you will need another antibiotic.

## 2018-11-26 NOTE — Patient Instructions (Signed)
  Your infection seems to have resolved. 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 Take an antihistamine once daily--ie claritin, zyrtec or allegra. This should help treat allergies, and with less postnasal drainage, there should be less cough. I recommend using Mucinex PLAIN 12 hour, and only using Delsym syrup (12 hour) as needed for cough. You can continue the tessalon if needed, but I think continuing lozenges are also a good idea. If you have recurrent fever, or if the phlegm becomes more consistently discolored, then you will need another antibiotic.

## 2018-12-02 ENCOUNTER — Encounter (INDEPENDENT_AMBULATORY_CARE_PROVIDER_SITE_OTHER): Payer: BC Managed Care – PPO | Admitting: *Deleted

## 2018-12-02 VITALS — BP 112/76 | HR 60 | Temp 98.0°F | Ht 63.0 in | Wt 260.5 lb

## 2018-12-02 DIAGNOSIS — Z006 Encounter for examination for normal comparison and control in clinical research program: Secondary | ICD-10-CM

## 2018-12-02 NOTE — Research (Signed)
Margaret Porter is here for her week 45 visit for The HAILO Study: A Long Term follow-up of Older HIV-Infected Adults in the ACTG, an observational study addressing the issues of aging, HIV infection and Inflammation  She recently got over an URI infection and needs her flu vaccine. She had a PAP smear in September which she said was abnormal and is getting a repeat mammogram this Friday for a finding. I will request results of both of these tests. Her vitamin D result was 10 and she is being restarted on Vit D 50000u weekly. She will be returning in February for the next visit.

## 2018-12-03 LAB — HEPATITIS C ANTIBODY
Hepatitis C Ab: NONREACTIVE
SIGNAL TO CUT-OFF: 0.01 (ref <1.00)

## 2018-12-03 LAB — PROTEIN / CREATININE RATIO, URINE
CREATININE, URINE: 377 mg/dL — AB (ref 20–275)
PROTEIN/CREAT RATIO: 440 mg/g{creat} — AB (ref 21–161)
Protein/Creatinine Ratio: 0.44 mg/mg creat — ABNORMAL HIGH (ref 0.021–0.16)
TOTAL PROTEIN, URINE: 166 mg/dL — AB (ref 5–24)

## 2018-12-03 LAB — COMPLETE METABOLIC PANEL WITH GFR
AG Ratio: 2 (calc) (ref 1.0–2.5)
ALKALINE PHOSPHATASE (APISO): 107 U/L (ref 33–130)
ALT: 17 U/L (ref 6–29)
AST: 17 U/L (ref 10–35)
Albumin: 4 g/dL (ref 3.6–5.1)
BILIRUBIN TOTAL: 0.4 mg/dL (ref 0.2–1.2)
BUN: 14 mg/dL (ref 7–25)
CHLORIDE: 108 mmol/L (ref 98–110)
CO2: 24 mmol/L (ref 20–32)
CREATININE: 0.97 mg/dL (ref 0.50–1.05)
Calcium: 9.1 mg/dL (ref 8.6–10.4)
GFR, Est African American: 79 mL/min/{1.73_m2} (ref >=60)
GFR, Est Non African American: 68 mL/min/{1.73_m2} (ref >=60)
GLOBULIN: 2 g/dL (ref 1.9–3.7)
Glucose, Bld: 105 mg/dL — ABNORMAL HIGH (ref 65–99)
Potassium: 4.4 mmol/L (ref 3.5–5.3)
SODIUM: 140 mmol/L (ref 135–146)
Total Protein: 6 g/dL — ABNORMAL LOW (ref 6.1–8.1)

## 2018-12-03 LAB — LIPID PANEL
Cholesterol: 149 mg/dL (ref <200)
HDL: 34 mg/dL — AB (ref >50)
LDL Cholesterol (Calc): 96 mg/dL (calc)
Non-HDL Cholesterol (Calc): 115 mg/dL (calc) (ref <130)
Total CHOL/HDL Ratio: 4.4 (calc) (ref <5.0)
Triglycerides: 95 mg/dL (ref <150)

## 2018-12-03 LAB — CBC WITH DIFFERENTIAL/PLATELET
BASOS ABS: 20 {cells}/uL (ref 0–200)
Basophils Relative: 0.4 %
EOS ABS: 29 {cells}/uL (ref 15–500)
EOS PCT: 0.6 %
HEMATOCRIT: 39.4 % (ref 35.0–45.0)
Hemoglobin: 13.3 g/dL (ref 11.7–15.5)
Lymphs Abs: 1495 cells/uL (ref 850–3900)
MCH: 31.2 pg (ref 27.0–33.0)
MCHC: 33.8 g/dL (ref 32.0–36.0)
MCV: 92.5 fL (ref 80.0–100.0)
MONOS PCT: 8.6 %
MPV: 11.5 fL (ref 7.5–12.5)
NEUTROS ABS: 2935 {cells}/uL (ref 1500–7800)
NEUTROS PCT: 59.9 %
Platelets: 301 10*3/uL (ref 140–400)
RBC: 4.26 10*6/uL (ref 3.80–5.10)
RDW: 13.7 % (ref 11.0–15.0)
Total Lymphocyte: 30.5 %
WBC mixed population: 421 cells/uL (ref 200–950)
WBC: 4.9 10*3/uL (ref 3.8–10.8)

## 2018-12-03 LAB — HEPATITIS B SURFACE ANTIGEN: Hepatitis B Surface Ag: NONREACTIVE

## 2018-12-03 LAB — HEPATITIS B SURFACE ANTIBODY,QUALITATIVE: HEP B S AB: NONREACTIVE

## 2018-12-03 LAB — HEMOGLOBIN A1C
HEMOGLOBIN A1C: 5.3 %{Hb} (ref <5.7)
Mean Plasma Glucose: 105 (calc)
eAG (mmol/L): 5.8 (calc)

## 2018-12-03 NOTE — Progress Notes (Signed)
Office: [phone]  /  Fax: [phone]   HPI:   Chief Complaint: OBESITY Margaret Porter is here to discuss her progress with her obesity treatment plan. She is on the Category 3 plan and is following her eating plan approximately 95 % of the time. She states she is exercising 0 minutes 0 times per week. [name] states that she is doing well with the Category 3 plan. She struggled slightly on the weekends; and she states that it is "hard to get the food in". She occasionally felt hunger in the evening. She has had some cravings. Her weight is 254 lb (115.2 kg) today and has had a weight loss of 8 pounds over a period of 2 weeks since her last visit. She has lost 8 lbs since starting treatment with [name].  Vitamin D deficiency [name] has a diagnosis of vitamin D deficiency. She is currently taking OTC vit D and her last level was at 10.0. [name] denies nausea, vomiting or muscle weakness.  Insulin Resistance [name] has a diagnosis of insulin resistance based on her elevated fasting insulin level of 21.1. Her Hgb A1c is at 5.4 Although [name]'s blood glucose readings are still under good control, insulin resistance puts her at greater risk of metabolic syndrome and diabetes. She is not taking metformin currently and continues to work on diet and exercise to decrease risk of diabetes. She denies polyphagia.  At risk for diabetes [name] is at higher than average risk for developing diabetes due to her obesity and insulin resistance. She currently denies polyuria or polydipsia.  ALLERGIES: Allergies  Allergen Reactions  . Molds & Smuts   . Oxycodone     Questionable mouth itching, sweats, but was also simultaneously on Cipro, Flagyl, and Robaxin.  [name] Kitchen Red Dye   . Amoxicillin Rash  . Aspirin Anxiety  . Sulfamethoxazole-Trimethoprim Rash    MEDICATIONS: Current Outpatient Medications on File Prior to Visit  Medication Sig Dispense Refill  . benzonatate (TESSALON) 200 MG capsule Take  1 capsule (200 mg total) by mouth 3 (three) times daily as needed. 30 capsule 0  . diphenhydrAMINE (BENADRYL) 25 MG tablet Take 25 mg by mouth every 6 (six) hours as needed for itching or allergies. Reported on 02/29/2016    . GENVOYA 150-150-200-10 MG TABS tablet TAKE ONE TABLET BY MOUTH ONCE DAILY WITH BREAKFAST. STORE IN ORIGINALCONTAINER AT ROOM TEMPERATURE. 30 tablet 2   No current facility-administered medications on file prior to visit.     PAST MEDICAL HISTORY: Past Medical History:  Diagnosis Date  . Allergic rhinitis   . Anemia   . Food allergy   . HIV infection ([place]) 2006  . Idiopathic urticaria   . Joint pain   . Obesity   . Pre-diabetes   . Rosacea   . Swelling of both lower extremities   . Vitamin D deficiency     PAST SURGICAL HISTORY: Past Surgical History:  Procedure Laterality Date  . APPENDECTOMY    . DILATATION & CURRETTAGE/HYSTEROSCOPY WITH RESECTOCOPE N/A 06/27/2015   Procedure: DILATATION & CURETTAGE/HYSTEROSCOPY WITH RESECTOCOPE;  Surgeon: Servando Salina, MD;  Location: [facility];  Service: Gynecology;  Laterality: N/A;  . LAPAROSCOPIC APPENDECTOMY N/A 09/07/2017   Procedure: APPENDECTOMY LAPAROSCOPIC;  Surgeon: Clovis Riley, MD;  Location: [facility];  Service: General;  Laterality: N/A;    SOCIAL HISTORY: Social History   Tobacco Use  . Smoking status: Never Smoker  . Smokeless tobacco: Never Used  Substance Use Topics  . Alcohol use: Yes  Alcohol/week: 0.0 standard drinks    Comment: occasional (special occasions)  . Drug use: No    FAMILY HISTORY: Family History  Problem Relation Age of Onset  . Diabetes Mother   . Diabetes Brother   . Heart disease Paternal Uncle   . Heart disease Maternal Grandmother   . Cancer Maternal Grandmother   . Heart disease Paternal Grandmother   . Diabetes Father   . High blood pressure Father     ROS: Review of Systems  Constitutional: Positive for weight loss.  Gastrointestinal: Negative for nausea  and vomiting.  Genitourinary: Negative for frequency.  Musculoskeletal:       Negative for muscle weakness  Endo/Heme/Allergies: Negative for polydipsia.       Positive for cravings Negative for polyphagia    PHYSICAL EXAM: Blood pressure 107/66, pulse 61, temperature 98.5 F (36.9 C), temperature source Oral, height 5\' 3"  (1.6 m), weight 254 lb (115.2 kg), SpO2 92 %. Body mass index is 44.99 kg/m. Physical Exam  Constitutional: She is oriented to person, place, and time. She appears well-developed and well-nourished.  Cardiovascular: Normal rate.  Pulmonary/Chest: Effort normal.  Musculoskeletal: Normal range of motion.  Neurological: She is oriented to person, place, and time.  Skin: Skin is warm and dry.  Psychiatric: She has a normal mood and affect. Her behavior is normal.  Vitals reviewed.   RECENT LABS AND TESTS: BMET    Component Value Date/Time   NA 140 12/02/2018 0810   K 4.4 12/02/2018 0810   CL 108 12/02/2018 0810   CO2 24 12/02/2018 0810   GLUCOSE 105 (H) 12/02/2018 0810   BUN 14 12/02/2018 0810   CREATININE 0.97 12/02/2018 0810   CALCIUM 9.1 12/02/2018 0810   GFRNONAA 68 12/02/2018 0810   GFRAA 79 12/02/2018 0810   Lab Results  Component Value Date   HGBA1C 5.3 12/02/2018   HGBA1C 5.4 11/12/2018   HGBA1C 5.3 02/14/2018   HGBA1C 5.1 04/04/2017   HGBA1C 5.7 (H) 02/13/2016   Lab Results  Component Value Date   INSULIN 21.1 11/12/2018   CBC    Component Value Date/Time   WBC 4.9 12/02/2018 0810   RBC 4.26 12/02/2018 0810   HGB 13.3 12/02/2018 0810   HGB 14.1 11/12/2018 0938   HCT 39.4 12/02/2018 0810   HCT 42.9 11/12/2018 0938   PLT 301 12/02/2018 0810   PLT 257 11/12/2018 0938   MCV 92.5 12/02/2018 0810   MCV 94 11/12/2018 0938   MCH 31.2 12/02/2018 0810   MCHC 33.8 12/02/2018 0810   RDW 13.7 12/02/2018 0810   RDW 13.2 11/12/2018 0938   LYMPHSABS 1,495 12/02/2018 0810   LYMPHSABS 0.9 11/12/2018 0938   MONOABS 0.5 02/13/2016 0923    EOSABS 29 12/02/2018 0810   EOSABS 0.0 11/12/2018 0938   BASOSABS 20 12/02/2018 0810   BASOSABS 0.0 11/12/2018 0938   Iron/TIBC/Ferritin/ %Sat No results found for: IRON, TIBC, FERRITIN, IRONPCTSAT Lipid Panel     Component Value Date/Time   CHOL 149 12/02/2018 0810   TRIG 95 12/02/2018 0810   HDL 34 (L) 12/02/2018 0810   CHOLHDL 4.4 12/02/2018 0810   VLDL 24 04/04/2017 0935   LDLCALC 96 12/02/2018 0810   Hepatic Function Panel     Component Value Date/Time   PROT 6.0 (L) 12/02/2018 0810   ALBUMIN 3.4 (L) 09/07/2017 1638   AST 17 12/02/2018 0810   ALT 17 12/02/2018 0810   ALKPHOS 102 09/07/2017 1638   BILITOT 0.4 12/02/2018 0810  BILIDIR 0.1 12/22/2015 0001   IBILI 0.3 12/22/2015 0001      Component Value Date/Time   TSH 2.170 11/12/2018 0938   TSH 1.985 11/29/2014 1029    Ref. Range 11/12/2018 09:38  Vitamin D, 25-Hydroxy Latest Ref Range: 30.0 - 100.0 ng/mL 10.0 (L)   ASSESSMENT AND PLAN: Vitamin D deficiency - Plan: Vitamin D, Ergocalciferol, (DRISDOL) 1.25 MG (50000 UT) CAPS capsule  Insulin resistance  At risk for diabetes mellitus  Class 3 severe obesity with serious comorbidity and body mass index (BMI) of 45.0 to 49.9 in adult, unspecified obesity type ([place])  PLAN:  Vitamin D Deficiency Craig was informed that low vitamin D levels contributes to fatigue and are associated with obesity, breast, and colon cancer. She agrees to continue to take prescription Vit D @50 ,000 IU every week #4 with no refills and will follow up for routine testing of vitamin D, at least 2-3 times per year. She was informed of the risk of over-replacement of vitamin D and agrees to not increase her dose unless she discusses this with [name] first. Ahsley agrees to follow up with our clinic in 2 weeks.  Insulin Resistance Alaiya will continue to work on weight loss, exercise, and decreasing simple carbohydrates in her diet to help decrease the risk of diabetes. We dicussed insulin  resistance with myself and our registered dietician today. She was informed that eating too many simple carbohydrates or too many calories at one sitting increases the likelihood of GI side effects. Tamaiya agreed to follow up with [name] as directed to monitor her progress.  Diabetes risk counseling Ren was given extended (15 minutes) diabetes prevention counseling today. She is 48 y.o. female and has risk factors for diabetes including obesity and insulin resistance. We discussed intensive lifestyle modifications today with an emphasis on weight loss as well as increasing exercise and decreasing simple carbohydrates in her diet.  Obesity Creasie is currently in the action stage of change. As such, her goal is to continue with weight loss efforts She has agreed to follow the Category 3 plan and continue to eat protein Terika has been instructed to work up to a goal of 150 minutes of combined cardio and strengthening exercise per week for weight loss and overall health benefits. We discussed the following Behavioral Modification Strategies today: increase H2O intake, increasing lean protein intake, decreasing simple carbohydrates , increasing vegetables, decrease eating out, work on meal planning and easy cooking plans, holiday eating strategies and celebration eating strategies  Aayla has agreed to follow up with our clinic in 2 weeks. She was informed of the importance of frequent follow up visits to maximize her success with intensive lifestyle modifications for her multiple health conditions.   OBESITY BEHAVIORAL INTERVENTION VISIT  Today's visit was # 2   Starting weight: 262 lbs Starting date: 11/12/2018 Today's weight : 254 lbs  Today's date: 11/26/2018 Total lbs lost to date: 8   ASK: We discussed the diagnosis of obesity with Judieth Keens today and Angellynn agreed to give [name] permission to discuss obesity behavioral modification therapy today.  ASSESS: Mitali has  the diagnosis of obesity and her BMI today is 45.01 Rylynn is in the action stage of change   ADVISE: Chelsie was educated on the multiple health risks of obesity as well as the benefit of weight loss to improve her health. She was advised of the need for long term treatment and the importance of lifestyle modifications to improve her current health and to  decrease her risk of future health problems.  AGREE: Multiple dietary modification options and treatment options were discussed and  Tykera agreed to follow the recommendations documented in the above note.  ARRANGE: Teondra was educated on the importance of frequent visits to treat obesity as outlined per CMS and USPSTF guidelines and agreed to schedule her next follow up appointment today.  Corey Skains, am acting as Location manager for [employer]. Owens Shark, DO  I have reviewed the above documentation for accuracy and completeness, and I agree with the above. -Jearld Lesch, DO

## 2018-12-09 ENCOUNTER — Ambulatory Visit (INDEPENDENT_AMBULATORY_CARE_PROVIDER_SITE_OTHER): Payer: BC Managed Care – PPO | Admitting: Bariatrics

## 2018-12-09 ENCOUNTER — Encounter (INDEPENDENT_AMBULATORY_CARE_PROVIDER_SITE_OTHER): Payer: Self-pay | Admitting: Bariatrics

## 2018-12-09 VITALS — BP 109/73 | HR 69 | Temp 97.3°F | Ht 63.0 in | Wt 251.0 lb

## 2018-12-09 DIAGNOSIS — Z6841 Body Mass Index (BMI) 40.0 and over, adult: Secondary | ICD-10-CM

## 2018-12-09 DIAGNOSIS — E8881 Metabolic syndrome: Secondary | ICD-10-CM | POA: Diagnosis not present

## 2018-12-09 DIAGNOSIS — E559 Vitamin D deficiency, unspecified: Secondary | ICD-10-CM

## 2018-12-09 NOTE — Progress Notes (Signed)
Office: [phone]  /  Fax: [phone]   HPI:   Chief Complaint: OBESITY Margaret Porter is here to discuss her progress with her obesity treatment plan. She is on the Category 3 plan and is following her eating plan approximately 95 % of the time. She states she is walking 30 minutes 5 times per week. Margaret Porter is doing well with the Category 3 plan. She has been cooking more. Her weight is 251 lb (113.9 kg) today and has had a weight loss of 3 pounds over a period of 2 weeks since her last visit. She has lost 11 lbs since starting treatment with [name].  Vitamin D deficiency Margaret Porter has a diagnosis of vitamin D deficiency. She is currently taking high dose vit D and denies nausea, vomiting or muscle weakness.  Insulin Resistance Margaret Porter has a diagnosis of insulin resistance based on her elevated fasting insulin level of 21.1. Her last A1c was at 5.4.  Although Margaret Porter's blood glucose readings are still under good control, insulin resistance puts her at greater risk of metabolic syndrome and diabetes. Margaret Porter is not on medications and she continues to work on diet and exercise to decrease risk of diabetes.  ALLERGIES: Allergies  Allergen Reactions   Molds & Smuts    Oxycodone     Questionable mouth itching, sweats, but was also simultaneously on Cipro, Flagyl, and Robaxin.   Red Dye    Amoxicillin Rash   Aspirin Anxiety   Sulfamethoxazole-Trimethoprim Rash    MEDICATIONS: Current Outpatient Medications on File Prior to Visit  Medication Sig Dispense Refill   benzonatate (TESSALON) 200 MG capsule Take 1 capsule (200 mg total) by mouth 3 (three) times daily as needed. 30 capsule 0   diphenhydrAMINE (BENADRYL) 25 MG tablet Take 25 mg by mouth every 6 (six) hours as needed for itching or allergies. Reported on 02/29/2016     GENVOYA 150-150-200-10 MG TABS tablet TAKE ONE TABLET BY MOUTH ONCE DAILY WITH BREAKFAST. STORE IN ORIGINALCONTAINER AT ROOM TEMPERATURE. 30 tablet 2    Vitamin D, Ergocalciferol, (DRISDOL) 1.25 MG (50000 UT) CAPS capsule Take 1 capsule (50,000 Units total) by mouth every 7 (seven) days. 4 capsule 0   No current facility-administered medications on file prior to visit.     PAST MEDICAL HISTORY: Past Medical History:  Diagnosis Date   Allergic rhinitis    Anemia    Food allergy    HIV infection ([place]) 2006   Idiopathic urticaria    Joint pain    Obesity    Pre-diabetes    Rosacea    Swelling of both lower extremities    Vitamin D deficiency     PAST SURGICAL HISTORY: Past Surgical History:  Procedure Laterality Date   APPENDECTOMY     DILATATION & CURRETTAGE/HYSTEROSCOPY WITH RESECTOCOPE N/A 06/27/2015   Procedure: DILATATION & CURETTAGE/HYSTEROSCOPY WITH RESECTOCOPE;  Surgeon: Servando Salina, MD;  Location: [facility];  Service: Gynecology;  Laterality: N/A;   LAPAROSCOPIC APPENDECTOMY N/A 09/07/2017   Procedure: APPENDECTOMY LAPAROSCOPIC;  Surgeon: Clovis Riley, MD;  Location: [facility];  Service: General;  Laterality: N/A;    SOCIAL HISTORY: Social History   Tobacco Use   Smoking status: Never Smoker   Smokeless tobacco: Never Used  Substance Use Topics   Alcohol use: Yes    Alcohol/week: 0.0 standard drinks    Comment: occasional (special occasions)   Drug use: No    FAMILY HISTORY: Family History  Problem Relation Age of Onset   Diabetes Mother  Diabetes Brother    Heart disease Paternal Uncle    Heart disease Maternal Grandmother    Cancer Maternal Grandmother    Heart disease Paternal Grandmother    Diabetes Father    High blood pressure Father     ROS: Review of Systems  Constitutional: Positive for weight loss.  Gastrointestinal: Negative for nausea and vomiting.  Musculoskeletal:       Negative for muscle weakness    PHYSICAL EXAM: Blood pressure 109/73, pulse 69, temperature (!) 97.3 F (36.3 C), temperature source Oral, height 5\' 3"  (1.6 m), weight 251 lb (113.9  kg), SpO2 93 %. Body mass index is 44.46 kg/m. Physical Exam  Constitutional: She is oriented to person, place, and time. She appears well-developed and well-nourished.  Cardiovascular: Normal rate.  Pulmonary/Chest: Effort normal.  Musculoskeletal: Normal range of motion.  Neurological: She is oriented to person, place, and time.  Skin: Skin is warm and dry.  Psychiatric: She has a normal mood and affect. Her behavior is normal.  Vitals reviewed.   RECENT LABS AND TESTS: BMET    Component Value Date/Time   NA 140 12/02/2018 0810   K 4.4 12/02/2018 0810   CL 108 12/02/2018 0810   CO2 24 12/02/2018 0810   GLUCOSE 105 (H) 12/02/2018 0810   BUN 14 12/02/2018 0810   CREATININE 0.97 12/02/2018 0810   CALCIUM 9.1 12/02/2018 0810   GFRNONAA 68 12/02/2018 0810   GFRAA 79 12/02/2018 0810   Lab Results  Component Value Date   HGBA1C 5.3 12/02/2018   HGBA1C 5.4 11/12/2018   HGBA1C 5.3 02/14/2018   HGBA1C 5.1 04/04/2017   HGBA1C 5.7 (H) 02/13/2016   Lab Results  Component Value Date   INSULIN 21.1 11/12/2018   CBC    Component Value Date/Time   WBC 4.9 12/02/2018 0810   RBC 4.26 12/02/2018 0810   HGB 13.3 12/02/2018 0810   HGB 14.1 11/12/2018 0938   HCT 39.4 12/02/2018 0810   HCT 42.9 11/12/2018 0938   PLT 301 12/02/2018 0810   PLT 257 11/12/2018 0938   MCV 92.5 12/02/2018 0810   MCV 94 11/12/2018 0938   MCH 31.2 12/02/2018 0810   MCHC 33.8 12/02/2018 0810   RDW 13.7 12/02/2018 0810   RDW 13.2 11/12/2018 0938   LYMPHSABS 1,495 12/02/2018 0810   LYMPHSABS 0.9 11/12/2018 0938   MONOABS 0.5 02/13/2016 0923   EOSABS 29 12/02/2018 0810   EOSABS 0.0 11/12/2018 0938   BASOSABS 20 12/02/2018 0810   BASOSABS 0.0 11/12/2018 0938   Iron/TIBC/Ferritin/ %Sat No results found for: IRON, TIBC, FERRITIN, IRONPCTSAT Lipid Panel     Component Value Date/Time   CHOL 149 12/02/2018 0810   TRIG 95 12/02/2018 0810   HDL 34 (L) 12/02/2018 0810   CHOLHDL 4.4 12/02/2018 0810    VLDL 24 04/04/2017 0935   LDLCALC 96 12/02/2018 0810   Hepatic Function Panel     Component Value Date/Time   PROT 6.0 (L) 12/02/2018 0810   ALBUMIN 3.4 (L) 09/07/2017 1638   AST 17 12/02/2018 0810   ALT 17 12/02/2018 0810   ALKPHOS 102 09/07/2017 1638   BILITOT 0.4 12/02/2018 0810   BILIDIR 0.1 12/22/2015 0001   IBILI 0.3 12/22/2015 0001      Component Value Date/Time   TSH 2.170 11/12/2018 0938   TSH 1.985 11/29/2014 1029    Ref. Range 11/12/2018 09:38  Vitamin D, 25-Hydroxy Latest Ref Range: 30.0 - 100.0 ng/mL 10.0 (L)   ASSESSMENT AND PLAN: Vitamin  D deficiency  Insulin resistance  Class 3 severe obesity with serious comorbidity and body mass index (BMI) of 40.0 to 44.9 in adult, unspecified obesity type ([place])  PLAN:  Vitamin D Deficiency Mayara was informed that low vitamin D levels contributes to fatigue and are associated with obesity, breast, and colon cancer. She will continue to take prescription Vit D @50 ,000 IU every week and will follow up for routine testing of vitamin D, at least 2-3 times per year. She was informed of the risk of over-replacement of vitamin D and agrees to not increase her dose unless she discusses this with [name] first.  Insulin Resistance Enya will continue to work on weight loss, exercise, increasing lean protein and decreasing simple carbohydrates in her diet to help decrease the risk of diabetes. We dicussed meal planning and high protein foods today. She was informed that eating too many simple carbohydrates or too many calories at one sitting increases the likelihood of GI side effects. Sandrina agreed to follow up with [name] as directed to monitor her progress.  I spent > than 50% of the 15 minute visit on counseling as documented in the note.  Obesity Brianny is currently in the action stage of change. As such, her goal is to continue with weight loss efforts She has agreed to follow the Category 3 plan Kinley has been  instructed to work up to a goal of 150 minutes of combined cardio and strengthening exercise per week for weight loss and overall health benefits. We discussed the following Behavioral Modification Strategies today: increase H2O intake, better snacking choices, increasing lean protein intake, decreasing simple carbohydrates, increasing vegetables, work on meal planning and easy cooking plans and ways to avoid night time snacking Courtnie will take protein at night. Recipes and 100 calorie snack handout was given to patient today.  Jonee has agreed to follow up with our clinic in 2 weeks. She was informed of the importance of frequent follow up visits to maximize her success with intensive lifestyle modifications for her multiple health conditions.   OBESITY BEHAVIORAL INTERVENTION VISIT  Today's visit was # 3   Starting weight: 262 lbs Starting date: 11/12/2018 Today's weight : 251 lbs Today's date: 12/09/2018 Total lbs lost to date: 67   ASK: We discussed the diagnosis of obesity with Judieth Keens today and Chattie agreed to give [name] permission to discuss obesity behavioral modification therapy today.  ASSESS: Josslyn has the diagnosis of obesity and her BMI today is 44.47 Criss is in the action stage of change   ADVISE: Ayeisha was educated on the multiple health risks of obesity as well as the benefit of weight loss to improve her health. She was advised of the need for long term treatment and the importance of lifestyle modifications to improve her current health and to decrease her risk of future health problems.  AGREE: Multiple dietary modification options and treatment options were discussed and  Masie agreed to follow the recommendations documented in the above note.  ARRANGE: Gloriajean was educated on the importance of frequent visits to treat obesity as outlined per CMS and USPSTF guidelines and agreed to schedule her next follow up appointment today.  Corey Skains, am acting as Location manager for [employer]. Owens Shark, DO  I have reviewed the above documentation for accuracy and completeness, and I agree with the above. -Jearld Lesch, DO

## 2018-12-22 ENCOUNTER — Ambulatory Visit (INDEPENDENT_AMBULATORY_CARE_PROVIDER_SITE_OTHER): Payer: BC Managed Care – PPO | Admitting: Bariatrics

## 2018-12-22 ENCOUNTER — Encounter: Payer: Self-pay | Admitting: Internal Medicine

## 2018-12-22 ENCOUNTER — Encounter (INDEPENDENT_AMBULATORY_CARE_PROVIDER_SITE_OTHER): Payer: Self-pay

## 2018-12-22 ENCOUNTER — Other Ambulatory Visit: Payer: Self-pay | Admitting: Radiology

## 2018-12-22 VITALS — BP 123/83 | HR 66 | Temp 97.8°F | Ht 63.0 in | Wt 251.0 lb

## 2018-12-22 DIAGNOSIS — Z9189 Other specified personal risk factors, not elsewhere classified: Secondary | ICD-10-CM | POA: Diagnosis not present

## 2018-12-22 DIAGNOSIS — E8881 Metabolic syndrome: Secondary | ICD-10-CM

## 2018-12-22 DIAGNOSIS — E88819 Insulin resistance, unspecified: Secondary | ICD-10-CM

## 2018-12-22 DIAGNOSIS — E66813 Obesity, class 3: Secondary | ICD-10-CM

## 2018-12-22 DIAGNOSIS — E559 Vitamin D deficiency, unspecified: Secondary | ICD-10-CM | POA: Diagnosis not present

## 2018-12-22 DIAGNOSIS — Z6841 Body Mass Index (BMI) 40.0 and over, adult: Secondary | ICD-10-CM

## 2018-12-22 MED ORDER — VITAMIN D (ERGOCALCIFEROL) 1.25 MG (50000 UNIT) PO CAPS: 50000.0000 [IU] | ORAL_CAPSULE | ORAL | 0 refills | Status: DC

## 2018-12-22 NOTE — Progress Notes (Signed)
Office: [phone]  /  Fax: [phone]   HPI:   Chief Complaint: OBESITY Margaret Porter is here to discuss her progress with her obesity treatment plan. She is on the Category 3 plan and is following her eating plan approximately 90 % of the time. She states she is exercising 10 to 15 minutes 2 to 3 times per week. Margaret Porter is doing well. She is struggling with time. She is occasionally having hunger and some cravings for carbohydrates. Her weight is 251 lb (113.9 kg) today and she has maintained weight over a period of 2 weeks since her last visit. She has lost 11 lbs since starting treatment with [name].  Vitamin D deficiency Margaret Porter has a diagnosis of vitamin D deficiency. She is currently taking vit D and denies nausea, vomiting or muscle weakness.  At risk for osteopenia and osteoporosis Margaret Porter is at higher risk of osteopenia and osteoporosis due to vitamin D deficiency.   Insulin Resistance Margaret Porter has a diagnosis of insulin resistance based on her elevated fasting insulin level >5. Her last insulin level was at 21.1 and last A1c was at 5.4. Although Margaret Porter's blood glucose readings are still under good control, insulin resistance puts her at greater risk of metabolic syndrome and diabetes. She is not taking metformin currently and continues to work on diet and exercise to decrease risk of diabetes.  ASSESSMENT AND PLAN:  Vitamin D deficiency - Plan: Vitamin D, Ergocalciferol, (DRISDOL) 1.25 MG (50000 UT) CAPS capsule  Insulin resistance  At risk for osteoporosis  Class 3 severe obesity with serious comorbidity and body mass index (BMI) of 40.0 to 44.9 in adult, unspecified obesity type ([place])  PLAN:  Vitamin D Deficiency Margaret Porter was informed that low vitamin D levels contributes to fatigue and are associated with obesity, breast, and colon cancer. She agrees to continue to take prescription Vit D @50 ,000 IU every week #4 with no refills and will follow up for routine testing  of vitamin D, at least 2-3 times per year. She was informed of the risk of over-replacement of vitamin D and agrees to not increase her dose unless she discusses this with [name] first. Margaret Porter agrees to follow up as directed.  At risk for osteopenia and osteoporosis Margaret Porter was given extended  (15 minutes) osteoporosis prevention counseling today. Margaret Porter is at risk for osteopenia and osteoporosis due to her vitamin D deficiency. She was encouraged to take her vitamin D and follow her higher calcium diet and increase strengthening exercise to help strengthen her bones and decrease her risk of osteopenia and osteoporosis.  Insulin Resistance Margaret Porter will continue to work on weight loss, exercise, increasing lean protein and decreasing simple carbohydrates in her diet to help decrease the risk of diabetes. She was informed that eating too many simple carbohydrates or too many calories at one sitting increases the likelihood of GI side effects. Margaret Porter agreed to follow up with [name] as directed to monitor her progress.  Obesity Margaret Porter is currently in the action stage of change. As such, her goal is to continue with weight loss efforts She has agreed to follow the Category 3 plan Margaret Porter has been instructed to work up to a goal of 150 minutes of combined cardio and strengthening exercise per week for weight loss and overall health benefits. We discussed the following Behavioral Modification Strategies today: increase H2O intake, no skipping meals, increasing lean protein intake, decreasing simple carbohydrates and increasing vegetables We will give Margaret Porter a handout sheet on healthy carb options. "smart fruit  choices" handout was provided to patient today.  Margaret Porter has agreed to follow up with our clinic in 2 weeks. She was informed of the importance of frequent follow up visits to maximize her success with intensive lifestyle modifications for her multiple health  conditions.  ALLERGIES: Allergies  Allergen Reactions  . Molds & Smuts   . Oxycodone     Questionable mouth itching, sweats, but was also simultaneously on Cipro, Flagyl, and Robaxin.  [name] Kitchen Red Dye   . Amoxicillin Rash  . Aspirin Anxiety  . Sulfamethoxazole-Trimethoprim Rash    MEDICATIONS: Current Outpatient Medications on File Prior to Visit  Medication Sig Dispense Refill  . benzonatate (TESSALON) 200 MG capsule Take 1 capsule (200 mg total) by mouth 3 (three) times daily as needed. 30 capsule 0  . diphenhydrAMINE (BENADRYL) 25 MG tablet Take 25 mg by mouth every 6 (six) hours as needed for itching or allergies. Reported on 02/29/2016    . GENVOYA 150-150-200-10 MG TABS tablet TAKE ONE TABLET BY MOUTH ONCE DAILY WITH BREAKFAST. STORE IN ORIGINALCONTAINER AT ROOM TEMPERATURE. 30 tablet 2  . Vitamin D, Ergocalciferol, (DRISDOL) 1.25 MG (50000 UT) CAPS capsule Take 1 capsule (50,000 Units total) by mouth every 7 (seven) days. 4 capsule 0   No current facility-administered medications on file prior to visit.     PAST MEDICAL HISTORY: Past Medical History:  Diagnosis Date  . Allergic rhinitis   . Anemia   . Food allergy   . HIV infection ([place]) 2006  . Idiopathic urticaria   . Joint pain   . Obesity   . Pre-diabetes   . Rosacea   . Swelling of both lower extremities   . Vitamin D deficiency     PAST SURGICAL HISTORY: Past Surgical History:  Procedure Laterality Date  . APPENDECTOMY    . DILATATION & CURRETTAGE/HYSTEROSCOPY WITH RESECTOCOPE N/A 06/27/2015   Procedure: DILATATION & CURETTAGE/HYSTEROSCOPY WITH RESECTOCOPE;  Surgeon: Servando Salina, MD;  Location: [facility];  Service: Gynecology;  Laterality: N/A;  . LAPAROSCOPIC APPENDECTOMY N/A 09/07/2017   Procedure: APPENDECTOMY LAPAROSCOPIC;  Surgeon: Clovis Riley, MD;  Location: [facility];  Service: General;  Laterality: N/A;    SOCIAL HISTORY: Social History   Tobacco Use  . Smoking status: Never Smoker  . Smokeless  tobacco: Never Used  Substance Use Topics  . Alcohol use: Yes    Alcohol/week: 0.0 standard drinks    Comment: occasional (special occasions)  . Drug use: No    FAMILY HISTORY: Family History  Problem Relation Age of Onset  . Diabetes Mother   . Diabetes Brother   . Heart disease Paternal Uncle   . Heart disease Maternal Grandmother   . Cancer Maternal Grandmother   . Heart disease Paternal Grandmother   . Diabetes Father   . High blood pressure Father     ROS: Review of Systems  Constitutional: Negative for weight loss.  Gastrointestinal: Negative for nausea and vomiting.  Musculoskeletal:       Negative for muscle weakness  Endo/Heme/Allergies:       Negative for polyphagia    PHYSICAL EXAM: Blood pressure 123/83, pulse 66, temperature 97.8 F (36.6 C), temperature source Oral, height 5\' 3"  (1.6 m), weight 251 lb (113.9 kg), SpO2 97 %. Body mass index is 44.46 kg/m. Physical Exam Vitals signs reviewed.  Constitutional:      Appearance: Normal appearance. She is well-developed. She is obese.  Cardiovascular:     Rate and Rhythm: Normal rate.  Pulmonary:  Effort: Pulmonary effort is normal.  Musculoskeletal: Normal range of motion.  Skin:    General: Skin is warm and dry.  Neurological:     Mental Status: She is alert and oriented to person, place, and time.  Psychiatric:        Mood and Affect: Mood normal.        Behavior: Behavior normal.     RECENT LABS AND TESTS: BMET    Component Value Date/Time   NA 140 12/02/2018 0810   K 4.4 12/02/2018 0810   CL 108 12/02/2018 0810   CO2 24 12/02/2018 0810   GLUCOSE 105 (H) 12/02/2018 0810   BUN 14 12/02/2018 0810   CREATININE 0.97 12/02/2018 0810   CALCIUM 9.1 12/02/2018 0810   GFRNONAA 68 12/02/2018 0810   GFRAA 79 12/02/2018 0810   Lab Results  Component Value Date   HGBA1C 5.3 12/02/2018   HGBA1C 5.4 11/12/2018   HGBA1C 5.3 02/14/2018   HGBA1C 5.1 04/04/2017   HGBA1C 5.7 (H) 02/13/2016    Lab Results  Component Value Date   INSULIN 21.1 11/12/2018   CBC    Component Value Date/Time   WBC 4.9 12/02/2018 0810   RBC 4.26 12/02/2018 0810   HGB 13.3 12/02/2018 0810   HGB 14.1 11/12/2018 0938   HCT 39.4 12/02/2018 0810   HCT 42.9 11/12/2018 0938   PLT 301 12/02/2018 0810   PLT 257 11/12/2018 0938   MCV 92.5 12/02/2018 0810   MCV 94 11/12/2018 0938   MCH 31.2 12/02/2018 0810   MCHC 33.8 12/02/2018 0810   RDW 13.7 12/02/2018 0810   RDW 13.2 11/12/2018 0938   LYMPHSABS 1,495 12/02/2018 0810   LYMPHSABS 0.9 11/12/2018 0938   MONOABS 0.5 02/13/2016 0923   EOSABS 29 12/02/2018 0810   EOSABS 0.0 11/12/2018 0938   BASOSABS 20 12/02/2018 0810   BASOSABS 0.0 11/12/2018 0938   Iron/TIBC/Ferritin/ %Sat No results found for: IRON, TIBC, FERRITIN, IRONPCTSAT Lipid Panel     Component Value Date/Time   CHOL 149 12/02/2018 0810   TRIG 95 12/02/2018 0810   HDL 34 (L) 12/02/2018 0810   CHOLHDL 4.4 12/02/2018 0810   VLDL 24 04/04/2017 0935   LDLCALC 96 12/02/2018 0810   Hepatic Function Panel     Component Value Date/Time   PROT 6.0 (L) 12/02/2018 0810   ALBUMIN 3.4 (L) 09/07/2017 1638   AST 17 12/02/2018 0810   ALT 17 12/02/2018 0810   ALKPHOS 102 09/07/2017 1638   BILITOT 0.4 12/02/2018 0810   BILIDIR 0.1 12/22/2015 0001   IBILI 0.3 12/22/2015 0001      Component Value Date/Time   TSH 2.170 11/12/2018 0938   TSH 1.985 11/29/2014 1029    Ref. Range 11/12/2018 09:38  Vitamin D, 25-Hydroxy Latest Ref Range: 30.0 - 100.0 ng/mL 10.0 (L)     OBESITY BEHAVIORAL INTERVENTION VISIT  Today's visit was # 4   Starting weight: 262 lbs Starting date: 11/12/2018 Today's weight : 251 lbs  Today's date: 12/22/2018 Total lbs lost to date: 62   ASK: We discussed the diagnosis of obesity with Judieth Keens today and Alayziah agreed to give [name] permission to discuss obesity behavioral modification therapy today.  ASSESS: Shakerra has the diagnosis of obesity  and her BMI today is 44.47 Billiejo is in the action stage of change   ADVISE: Phil was educated on the multiple health risks of obesity as well as the benefit of weight loss to improve her health. She was advised  of the need for long term treatment and the importance of lifestyle modifications to improve her current health and to decrease her risk of future health problems.  AGREE: Multiple dietary modification options and treatment options were discussed and  Arnella agreed to follow the recommendations documented in the above note.  ARRANGE: Levon was educated on the importance of frequent visits to treat obesity as outlined per CMS and USPSTF guidelines and agreed to schedule her next follow up appointment today.  Corey Skains, am acting as Location manager for [employer]. Owens Shark, DO  I have reviewed the above documentation for accuracy and completeness, and I agree with the above. -Jearld Lesch, DO

## 2018-12-31 DIAGNOSIS — M899 Disorder of bone, unspecified: Secondary | ICD-10-CM

## 2018-12-31 DIAGNOSIS — M898X9 Other specified disorders of bone, unspecified site: Secondary | ICD-10-CM

## 2018-12-31 HISTORY — DX: Disorder of bone, unspecified: M89.9

## 2018-12-31 HISTORY — DX: Other specified disorders of bone, unspecified site: M89.8X9

## 2019-01-07 ENCOUNTER — Ambulatory Visit (INDEPENDENT_AMBULATORY_CARE_PROVIDER_SITE_OTHER): Payer: BC Managed Care – PPO | Admitting: Bariatrics

## 2019-01-07 ENCOUNTER — Encounter (INDEPENDENT_AMBULATORY_CARE_PROVIDER_SITE_OTHER): Payer: Self-pay | Admitting: Bariatrics

## 2019-01-07 VITALS — BP 122/77 | HR 73 | Temp 97.6°F | Ht 63.0 in | Wt 252.0 lb

## 2019-01-07 DIAGNOSIS — Z9189 Other specified personal risk factors, not elsewhere classified: Secondary | ICD-10-CM

## 2019-01-07 DIAGNOSIS — Z6841 Body Mass Index (BMI) 40.0 and over, adult: Secondary | ICD-10-CM

## 2019-01-07 DIAGNOSIS — E559 Vitamin D deficiency, unspecified: Secondary | ICD-10-CM | POA: Diagnosis not present

## 2019-01-07 DIAGNOSIS — E8881 Metabolic syndrome: Secondary | ICD-10-CM

## 2019-01-07 MED ORDER — VITAMIN D (ERGOCALCIFEROL) 1.25 MG (50000 UNIT) PO CAPS: 50000.0000 [IU] | ORAL_CAPSULE | ORAL | 0 refills | Status: DC

## 2019-01-08 LAB — HIV RNA, QUANTITATIVE, PCR: HIV 1 RNA Quant: NOT DETECTED {copies} (ref >40)

## 2019-01-08 NOTE — Progress Notes (Signed)
Office: [phone]  /  Fax: [phone]   HPI:   Chief Complaint: OBESITY Margaret Porter is here to discuss her progress with her obesity treatment plan. She is on the Category 3 plan and is following her eating plan approximately 90 % of the time. She states she is exercising 0 minutes 0 times per week. [name] is doing well overall. She has gained 1 pound over the holidays. [name] was off her routine over the holidays. Her weight is 252 lb (114.3 kg) today and has had a weight gain of 1 pound over a period of 2 weeks since her last visit. She has lost 10 lbs since starting treatment with [name].  Insulin Resistance [name] has a diagnosis of insulin resistance based on her elevated fasting insulin level >5. Although [name]'s blood glucose readings are still under good control, insulin resistance puts her at greater risk of metabolic syndrome and diabetes. Her last A1c was at 5.4 and last insulin level was at 21.1 She is not taking metformin currently and continues to work on diet and exercise to decrease risk of diabetes. [name] denies polyphagia.  At risk for diabetes [name] is at higher than average risk for developing diabetes due to her obesity and insulin resistance. She currently denies polyuria or polydipsia.  Vitamin D deficiency [name] has a diagnosis of vitamin D deficiency. She is currently taking vit D and denies nausea, vomiting or muscle weakness.  ASSESSMENT AND PLAN:  Insulin resistance  Vitamin D deficiency - Plan: Vitamin D, Ergocalciferol, (DRISDOL) 1.25 MG (50000 UT) CAPS capsule  At risk for diabetes mellitus  Class 3 severe obesity with serious comorbidity and body mass index (BMI) of 40.0 to 44.9 in adult, unspecified obesity type ([place])  PLAN:  Insulin Resistance [name] will continue to work on weight loss, exercise, increasing lean protein, decreasing unhealthy snacks and decreasing simple carbohydrates in her diet to help decrease the risk of  diabetes. She was informed that eating too many simple carbohydrates or too many calories at one sitting increases the likelihood of GI side effects. [name] agreed to follow up with [name] as directed to monitor her progress.  Diabetes risk counselling [name] was given extended (15 minutes) diabetes prevention counseling today. She is 49 y.o. female and has risk factors for diabetes including obesity and insulin resistance. We discussed intensive lifestyle modifications today with an emphasis on weight loss as well as increasing exercise and decreasing simple carbohydrates in her diet.  Vitamin D Deficiency [name] was informed that low vitamin D levels contributes to fatigue and are associated with obesity, breast, and colon cancer. She agrees to continue to take prescription Vit D @50 ,000 IU every week #4 with no refills and will follow up for routine testing of vitamin D, at least 2-3 times per year. She was informed of the risk of over-replacement of vitamin D and agrees to not increase her dose unless she discusses this with [name] first. [name] agrees to follow up as directed.  Obesity [name] is currently in the action stage of change. As such, her goal is to continue with weight loss efforts She has agreed to follow the Category 3 plan [name] has been instructed to work up to a goal of 150 minutes of combined cardio and strengthening exercise per week or walking every other day for weight loss and overall health benefits. We discussed the following Behavioral Modification Strategies today: decreasing eating right before bed, decreasing sodium intake, decrease eating out, increase H2O intake, keeping healthy foods in  the home, increasing lean protein intake, decreasing simple carbohydrates, increasing vegetables and work on meal planning and easy cooking plans Handouts for store bought seasonings and homemade seasonings were provided to patient today.  [name] has agreed to follow up with  our clinic in 2 weeks. She was informed of the importance of frequent follow up visits to maximize her success with intensive lifestyle modifications for her multiple health conditions.  ALLERGIES: Allergies  Allergen Reactions  . Molds & Smuts   . Oxycodone     Questionable mouth itching, sweats, but was also simultaneously on Cipro, Flagyl, and Robaxin.  [name] Kitchen Red Dye   . Amoxicillin Rash  . Aspirin Anxiety  . Sulfamethoxazole-Trimethoprim Rash    MEDICATIONS: Current Outpatient Medications on File Prior to Visit  Medication Sig Dispense Refill  . benzonatate (TESSALON) 200 MG capsule Take 1 capsule (200 mg total) by mouth 3 (three) times daily as needed. 30 capsule 0  . diphenhydrAMINE (BENADRYL) 25 MG tablet Take 25 mg by mouth every 6 (six) hours as needed for itching or allergies. Reported on 02/29/2016    . GENVOYA 150-150-200-10 MG TABS tablet TAKE ONE TABLET BY MOUTH ONCE DAILY WITH BREAKFAST. STORE IN ORIGINALCONTAINER AT ROOM TEMPERATURE. 30 tablet 2   No current facility-administered medications on file prior to visit.     PAST MEDICAL HISTORY: Past Medical History:  Diagnosis Date  . Allergic rhinitis   . Anemia   . Food allergy   . HIV infection ([place]) 2006  . Idiopathic urticaria   . Joint pain   . Obesity   . Pre-diabetes   . Rosacea   . Swelling of both lower extremities   . Vitamin D deficiency     PAST SURGICAL HISTORY: Past Surgical History:  Procedure Laterality Date  . APPENDECTOMY    . DILATATION & CURRETTAGE/HYSTEROSCOPY WITH RESECTOCOPE N/A 06/27/2015   Procedure: DILATATION & CURETTAGE/HYSTEROSCOPY WITH RESECTOCOPE;  Surgeon: Servando Salina, MD;  Location: [facility];  Service: Gynecology;  Laterality: N/A;  . LAPAROSCOPIC APPENDECTOMY N/A 09/07/2017   Procedure: APPENDECTOMY LAPAROSCOPIC;  Surgeon: Clovis Riley, MD;  Location: [facility];  Service: General;  Laterality: N/A;    SOCIAL HISTORY: Social History   Tobacco Use  . Smoking status: Never  Smoker  . Smokeless tobacco: Never Used  Substance Use Topics  . Alcohol use: Yes    Alcohol/week: 0.0 standard drinks    Comment: occasional (special occasions)  . Drug use: No    FAMILY HISTORY: Family History  Problem Relation Age of Onset  . Diabetes Mother   . Diabetes Brother   . Heart disease Paternal Uncle   . Heart disease Maternal Grandmother   . Cancer Maternal Grandmother   . Heart disease Paternal Grandmother   . Diabetes Father   . High blood pressure Father     ROS: Review of Systems  Constitutional: Negative for weight loss.  Gastrointestinal: Negative for nausea and vomiting.  Genitourinary: Negative for frequency.  Musculoskeletal:       Negative for muscle weakness  Endo/Heme/Allergies: Negative for polydipsia.       Negative for polyphagia    PHYSICAL EXAM: Blood pressure 122/77, pulse 73, temperature 97.6 F (36.4 C), temperature source Oral, height 5\' 3"  (1.6 m), weight 252 lb (114.3 kg), SpO2 94 %. Body mass index is 44.64 kg/m. Physical Exam Vitals signs reviewed.  Constitutional:      Appearance: Normal appearance. She is well-developed. She is obese.  Cardiovascular:     Rate  and Rhythm: Normal rate.  Pulmonary:     Effort: Pulmonary effort is normal.  Musculoskeletal: Normal range of motion.  Skin:    General: Skin is warm and dry.  Neurological:     Mental Status: She is alert and oriented to person, place, and time.  Psychiatric:        Mood and Affect: Mood normal.        Behavior: Behavior normal.     RECENT LABS AND TESTS: BMET    Component Value Date/Time   NA 140 12/02/2018 0810   K 4.4 12/02/2018 0810   CL 108 12/02/2018 0810   CO2 24 12/02/2018 0810   GLUCOSE 105 (H) 12/02/2018 0810   BUN 14 12/02/2018 0810   CREATININE 0.97 12/02/2018 0810   CALCIUM 9.1 12/02/2018 0810   GFRNONAA 68 12/02/2018 0810   GFRAA 79 12/02/2018 0810   Lab Results  Component Value Date   HGBA1C 5.3 12/02/2018   HGBA1C 5.4  11/12/2018   HGBA1C 5.3 02/14/2018   HGBA1C 5.1 04/04/2017   HGBA1C 5.7 (H) 02/13/2016   Lab Results  Component Value Date   INSULIN 21.1 11/12/2018   CBC    Component Value Date/Time   WBC 4.9 12/02/2018 0810   RBC 4.26 12/02/2018 0810   HGB 13.3 12/02/2018 0810   HGB 14.1 11/12/2018 0938   HCT 39.4 12/02/2018 0810   HCT 42.9 11/12/2018 0938   PLT 301 12/02/2018 0810   PLT 257 11/12/2018 0938   MCV 92.5 12/02/2018 0810   MCV 94 11/12/2018 0938   MCH 31.2 12/02/2018 0810   MCHC 33.8 12/02/2018 0810   RDW 13.7 12/02/2018 0810   RDW 13.2 11/12/2018 0938   LYMPHSABS 1,495 12/02/2018 0810   LYMPHSABS 0.9 11/12/2018 0938   MONOABS 0.5 02/13/2016 0923   EOSABS 29 12/02/2018 0810   EOSABS 0.0 11/12/2018 0938   BASOSABS 20 12/02/2018 0810   BASOSABS 0.0 11/12/2018 0938   Iron/TIBC/Ferritin/ %Sat No results found for: IRON, TIBC, FERRITIN, IRONPCTSAT Lipid Panel     Component Value Date/Time   CHOL 149 12/02/2018 0810   TRIG 95 12/02/2018 0810   HDL 34 (L) 12/02/2018 0810   CHOLHDL 4.4 12/02/2018 0810   VLDL 24 04/04/2017 0935   LDLCALC 96 12/02/2018 0810   Hepatic Function Panel     Component Value Date/Time   PROT 6.0 (L) 12/02/2018 0810   ALBUMIN 3.4 (L) 09/07/2017 1638   AST 17 12/02/2018 0810   ALT 17 12/02/2018 0810   ALKPHOS 102 09/07/2017 1638   BILITOT 0.4 12/02/2018 0810   BILIDIR 0.1 12/22/2015 0001   IBILI 0.3 12/22/2015 0001      Component Value Date/Time   TSH 2.170 11/12/2018 0938   TSH 1.985 11/29/2014 1029     Ref. Range 11/12/2018 09:38  Vitamin D, 25-Hydroxy Latest Ref Range: 30.0 - 100.0 ng/mL 10.0 (L)     OBESITY BEHAVIORAL INTERVENTION VISIT  Today's visit was # 5   Starting weight: 262 lbs Starting date: 11/12/2018 Today's weight : 252 lbs Today's date: 01/07/2019 Total lbs lost to date: 10   ASK: We discussed the diagnosis of obesity with Judieth Keens today and Briannon agreed to give [name] permission to discuss obesity  behavioral modification therapy today.  ASSESS: Kimberlly has the diagnosis of obesity and her BMI today is 44.65 Rechel is in the action stage of change   ADVISE: Peggi was educated on the multiple health risks of obesity as well as the benefit  of weight loss to improve her health. She was advised of the need for long term treatment and the importance of lifestyle modifications to improve her current health and to decrease her risk of future health problems.  AGREE: Multiple dietary modification options and treatment options were discussed and  Estephany agreed to follow the recommendations documented in the above note.  ARRANGE: Janisha was educated on the importance of frequent visits to treat obesity as outlined per CMS and USPSTF guidelines and agreed to schedule her next follow up appointment today.  Corey Skains, am acting as Location manager for [employer]. Owens Shark, DO  I have reviewed the above documentation for accuracy and completeness, and I agree with the above. -Jearld Lesch, DO

## 2019-01-20 ENCOUNTER — Telehealth: Payer: Self-pay | Admitting: Medical

## 2019-01-20 ENCOUNTER — Ambulatory Visit: Payer: BC Managed Care – PPO | Admitting: Medical

## 2019-01-20 ENCOUNTER — Encounter: Payer: Self-pay | Admitting: Medical

## 2019-01-20 VITALS — BP 126/80 | HR 65 | Temp 98.3°F | Resp 16 | Ht 63.0 in | Wt 256.2 lb

## 2019-01-20 DIAGNOSIS — N3941 Urge incontinence: Secondary | ICD-10-CM

## 2019-01-20 DIAGNOSIS — Z0000 Encounter for general adult medical examination without abnormal findings: Secondary | ICD-10-CM | POA: Diagnosis not present

## 2019-01-20 DIAGNOSIS — E8881 Metabolic syndrome: Secondary | ICD-10-CM

## 2019-01-20 DIAGNOSIS — L719 Rosacea, unspecified: Secondary | ICD-10-CM

## 2019-01-20 DIAGNOSIS — E559 Vitamin D deficiency, unspecified: Secondary | ICD-10-CM | POA: Diagnosis not present

## 2019-01-20 DIAGNOSIS — Z9049 Acquired absence of other specified parts of digestive tract: Secondary | ICD-10-CM

## 2019-01-20 DIAGNOSIS — B20 Human immunodeficiency virus [HIV] disease: Secondary | ICD-10-CM

## 2019-01-20 DIAGNOSIS — Z23 Encounter for immunization: Secondary | ICD-10-CM | POA: Diagnosis not present

## 2019-01-20 DIAGNOSIS — E6609 Other obesity due to excess calories: Secondary | ICD-10-CM

## 2019-01-20 DIAGNOSIS — Z1211 Encounter for screening for malignant neoplasm of colon: Secondary | ICD-10-CM

## 2019-01-20 DIAGNOSIS — Z7189 Other specified counseling: Secondary | ICD-10-CM

## 2019-01-20 DIAGNOSIS — M7660 Achilles tendinitis, unspecified leg: Secondary | ICD-10-CM

## 2019-01-20 DIAGNOSIS — E785 Hyperlipidemia, unspecified: Secondary | ICD-10-CM

## 2019-01-20 DIAGNOSIS — Z7185 Encounter for immunization safety counseling: Secondary | ICD-10-CM | POA: Insufficient documentation

## 2019-01-20 DIAGNOSIS — E88819 Insulin resistance, unspecified: Secondary | ICD-10-CM

## 2019-01-20 DIAGNOSIS — R748 Abnormal levels of other serum enzymes: Secondary | ICD-10-CM

## 2019-01-20 LAB — POCT URINALYSIS DIP (PROADVANTAGE DEVICE)
Bilirubin, UA: NEGATIVE
GLUCOSE UA: NEGATIVE mg/dL
Ketones, POC UA: NEGATIVE mg/dL
Nitrite, UA: NEGATIVE
Protein Ur, POC: 100 mg/dL — AB
Specific Gravity, Urine: 1.03
Urobilinogen, Ur: NEGATIVE
pH, UA: 5.5 (ref 5.0–8.0)

## 2019-01-20 NOTE — Progress Notes (Signed)
Subjective:   HPI  Margaret Porter is a 49 y.o. female who presents for Chief Complaint  Patient presents with  . CPE    fasting CPE    Medical care team includes: Brenda Samano, Camelia Eng, PA-C here for primary care Dentist, [facility] Dr. Garwin Brothers with [facility] Dr. Owens Shark with weight management clinic [facility] Dr. Michel Bickers with infectious disease  Concerns: Has had some recent pain in her left Achilles tendon without injury or trauma.  She is coaching basketball  She is compliant with medication and treatment through infectious disease clinic with Dr. Megan Salon.  She is also enrolled in a research study  Reviewed their medical, surgical, family, social, medication, and allergy history and updated chart as appropriate.  Past Medical History:  Diagnosis Date  . Allergic rhinitis   . Anemia   . Food allergy   . HIV infection ([place]) 2006  . Idiopathic urticaria   . Joint pain   . Obesity   . Pre-diabetes   . Rosacea   . Swelling of both lower extremities   . Vitamin D deficiency     Past Surgical History:  Procedure Laterality Date  . APPENDECTOMY    . DILATATION & CURRETTAGE/HYSTEROSCOPY WITH RESECTOCOPE N/A 06/27/2015   Procedure: DILATATION & CURETTAGE/HYSTEROSCOPY WITH RESECTOCOPE;  Surgeon: Servando Salina, MD;  Location: [facility];  Service: Gynecology;  Laterality: N/A;  . LAPAROSCOPIC APPENDECTOMY N/A 09/07/2017   Procedure: APPENDECTOMY LAPAROSCOPIC;  Surgeon: Clovis Riley, MD;  Location: [facility];  Service: General;  Laterality: N/A;    Social History   Socioeconomic History  . Marital status: Single    Spouse name: Not on file  . Number of children: Not on file  . Years of education: Not on file  . Highest education level: Not on file  Occupational History  . Occupation: Art therapist  Social Needs  . Financial resource strain: Not on file  . Food insecurity:    Worry: Not on file    Inability: Not on  file  . Transportation needs:    Medical: Not on file    Non-medical: Not on file  Tobacco Use  . Smoking status: Never Smoker  . Smokeless tobacco: Never Used  Substance and Sexual Activity  . Alcohol use: Yes    Alcohol/week: 0.0 standard drinks    Comment: occasional (special occasions)  . Drug use: No  . Sexual activity: Never    Partners: Male    Birth control/protection: Condom    Comment: given condoms  Lifestyle  . Physical activity:    Days per week: Not on file    Minutes per session: Not on file  . Stress: Not on file  Relationships  . Social connections:    Talks on phone: Not on file    Gets together: Not on file    Attends religious service: Not on file    Active member of club or organization: Not on file    Attends meetings of clubs or organizations: Not on file    Relationship status: Not on file  . Intimate partner violence:    Fear of current or ex partner: Not on file    Emotionally abused: Not on file    Physically abused: Not on file    Forced sexual activity: Not on file  Other Topics Concern  . 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   Single, no  children    Family History  Problem Relation Age of Onset  . Diabetes Mother   . Diabetes Brother   . Heart disease Paternal Uncle   . Heart disease Maternal Grandmother   . Cancer Maternal Grandmother   . Heart disease Paternal Grandmother   . Diabetes Father   . High blood pressure Father      Current Outpatient Medications:  .  diphenhydrAMINE (BENADRYL) 25 MG tablet, Take 25 mg by mouth every 6 (six) hours as needed for itching or allergies. Reported on 02/29/2016, Disp: , Rfl:  .  GENVOYA 150-150-200-10 MG TABS tablet, TAKE ONE TABLET BY MOUTH ONCE DAILY WITH BREAKFAST. STORE IN ORIGINALCONTAINER AT ROOM TEMPERATURE., Disp: 30 tablet, Rfl: 2 .  Vitamin D, Ergocalciferol, (DRISDOL) 1.25 MG (50000 UT) CAPS capsule, Take 1 capsule (50,000 Units  total) by mouth every 7 (seven) days., Disp: 4 capsule, Rfl: 0  Allergies  Allergen Reactions  . Molds & Smuts   . Oxycodone     Questionable mouth itching, sweats, but was also simultaneously on Cipro, Flagyl, and Robaxin.  [name] Kitchen Red Dye   . Amoxicillin Rash  . Aspirin Anxiety  . Sulfamethoxazole-Trimethoprim Rash    Review of Systems Constitutional: -fever, -chills, -sweats, -unexpected weight change, -decreased appetite, -fatigue Allergy: -sneezing, -itching, -congestion Dermatology: -changing moles, --rash, -lumps ENT: -runny nose, -ear pain, -sore throat, -hoarseness, -sinus pain, -teeth pain, - ringing in ears, -hearing loss, -nosebleeds Cardiology: -chest pain, -palpitations, -swelling, -difficulty breathing when lying flat, -waking up short of breath Respiratory: -cough, -shortness of breath, -difficulty breathing with exercise or exertion, -wheezing, -coughing up blood Gastroenterology: -abdominal pain, -nausea, -vomiting, -diarrhea, -constipation, -blood in stool, -changes in bowel movement, -difficulty swallowing or eating Hematology: -bleeding, -bruising  Musculoskeletal: -joint aches, -muscle aches, -joint swelling, -back pain, -neck pain, -cramping, -changes in gait Ophthalmology: denies vision changes, eye redness, itching, discharge Urology: -burning with urination, -difficulty urinating, -blood in urine, -urinary frequency, -urgency, -incontinence Neurology: -headache, -weakness, -tingling, -numbness, -memory loss, -falls, -dizziness Psychology: -depressed mood, -agitation, -sleep problems Breast/gyn: -breast tenderness, -discharge, -lumps, -vaginal discharge,- irregular periods, -heavy periods     Objective:  BP 126/80   Pulse 65   Temp 98.3 F (36.8 C) (Oral)   Resp 16   Ht 5\' 3"  (1.6 m)   Wt 256 lb 3.2 oz (116.2 kg)   LMP 01/18/2019 (Exact Date)   SpO2 98%   BMI 45.38 kg/m   General appearance: alert, no distress, WD/WN, African American female Skin:  scattered macules, no worrisome lesions HEENT: normocephalic, conjunctiva/corneas normal, sclerae anicteric, PERRLA, EOMi, nares patent, no discharge or erythema, pharynx normal Oral cavity: MMM, tongue normal, teeth normal Neck: supple, no lymphadenopathy, no thyromegaly, no masses, normal ROM, no bruits Chest: non tender, normal shape and expansion Heart: RRR, normal S1, S2, no murmurs Lungs: CTA bilaterally, no wheezes, rhonchi, or rales Abdomen: +bs, soft, non tender, non distended, no masses, no hepatomegaly, no splenomegaly, no bruits Back: non tender, normal ROM, no scoliosis Musculoskeletal: upper extremities non tender, no obvious deformity, normal ROM throughout, lower extremities non tender, no obvious deformity, normal ROM throughout Extremities: no edema, no cyanosis, no clubbing Pulses: 2+ symmetric, upper and lower extremities, normal cap refill Neurological: alert, oriented x 3, CN2-12 intact, strength normal upper extremities and lower extremities, sensation normal throughout, DTRs 2+ throughout, no cerebellar signs, gait normal Psychiatric: normal affect, behavior normal, pleasant  Breast/gyn/rectal - deferred to gynecology  Assessment and Plan :   Encounter Diagnoses  Name Primary?  [name] Kitchen  Routine general medical examination at a health care facility Yes  . Encounter for health maintenance examination in adult   . Human immunodeficiency virus (HIV) disease ([place])   . S/P appendectomy   . Vitamin D deficiency   . URINARY INCONTINENCE, URGE   . Class 1 obesity due to excess calories without serious comorbidity in adult, unspecified BMI   . Hyperlipidemia, unspecified hyperlipidemia type   . Alkaline phosphatase elevation   . Rosacea   . Insulin resistance   . Vaccine counseling   . Need for pneumococcal vaccination   . Screen for colon cancer   . Achilles tendon pain     Physical exam - discussed and counseled on healthy lifestyle, diet, exercise, preventative care,  vaccinations, sick and well care, proper use of emergency dept and after hours care, and addressed their concerns.    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  She reports that she was advised normal yearly follow-up status post the biopsy  Recent labs done: CBC from 12/02/2018 reviewed, normal Comprehensive metabolic panel from 24/03/101 reviewed, glucose 105, otherwise normal Hemoglobin A1c 5.3% on 12/02/2018 Lipid panel with 34 HDL on 12/02/2018 HIV viral load not detected on 12/02/2018 Thyroid labs normal in November 2019 Folate B12 normal in November 2019 Vitamin D significantly low in November 2019  Counseled on the pneumococcal vaccine.  Vaccine information sheet given.  Pneumococcal vaccine PPSV23 given after consent obtained.   Separate significant issues discussed: We updated your pneumonia vaccine today, pneumococcal 23 vaccine  Shingles vaccine:  I recommend you have a shingles vaccine to help prevent shingles or herpes zoster outbreak.   Please call your insurer to inquire about coverage for the Shingrix vaccine given in 2 doses.   Some insurers cover this vaccine after age 43, some cover this after age 70.  If your insurer covers this, then call to schedule appointment to have this vaccine here.  We will refer you for your first colonoscopy  You had some recent abnormal urine protein labs through the research study according to your chart.  Please follow-up with the research study about this  I am glad to see that your mammogram eventually led to a biopsy that was normal.  Continue routine follow-up with your gynecologist  See your dentist and eye doctor regularly, yearly.  See me yearly for a physical  You had an abnormal CT scan of your belly and pelvis back in 2018.  I will check back into this and discussed with radiology to determine what we may need to do for  further evaluation  Continue vitamin D supplement and plan to recheck this in 3 months Foods that contain vitamin D include seafood such as oysters, shrimp, salmon, herring, cod, egg yolks, mushrooms, milk, and foods fortified with Vitamin D such as orange juice, cereals.  Its also important to get some sun exposure regularly to absorb vitamin D.  Azra was seen today for cpe.  Diagnoses and all orders for this visit:  Routine general medical examination at a health care facility -     POCT Urinalysis DIP (Proadvantage Device)  Encounter for health maintenance examination in adult  Human immunodeficiency virus (HIV) disease ([employer] [place])  S/P appendectomy  Vitamin D deficiency  URINARY INCONTINENCE, URGE  Class 1 obesity due to excess calories without serious comorbidity in adult, unspecified BMI  Hyperlipidemia, unspecified hyperlipidemia type  Alkaline phosphatase elevation  Rosacea  Insulin resistance  Vaccine counseling  Need for pneumococcal vaccination -     Pneumococcal polysaccharide vaccine 23-valent greater than or equal to 2yo subcutaneous/IM  Screen for colon cancer  Achilles tendon pain   Follow-up pending labs, yearly for physical

## 2019-01-20 NOTE — Patient Instructions (Addendum)
Recommendations We updated your pneumonia vaccine today, pneumococcal 23 vaccine  Shingles vaccine:  I recommend you have a shingles vaccine to help prevent shingles or herpes zoster outbreak.   Please call your insurer to inquire about coverage for the Shingrix vaccine given in 2 doses.   Some insurers cover this vaccine after age 51, some cover this after age 15.  If your insurer covers this, then call to schedule appointment to have this vaccine here.  We will refer you for your first colonoscopy  You had some recent abnormal urine protein labs through the research study according to your chart.  Please follow-up with the research study about this  I am glad to see that your mammogram eventually led to a biopsy that was normal.  Continue routine follow-up with your gynecologist  See your dentist and eye doctor regularly, yearly.  See me yearly for a physical  You had an abnormal CT scan of your belly and pelvis back in 2018.  I will check back into this and discussed with radiology to determine what we may need to do for further evaluation  Continue vitamin D supplement and plan to recheck this in 3 months Foods that contain vitamin D include seafood such as oysters, shrimp, salmon, herring, cod, egg yolks, mushrooms, milk, and foods fortified with Vitamin D such as orange juice, cereals.  Its also important to get some sun exposure regularly to absorb vitamin D.   I have included other useful information below for your review.  Preventative Care for Adults - Female      MAINTAIN REGULAR HEALTH EXAMS:  A routine yearly physical is a good way to check in with your primary care provider about your health and preventive screening. It is also an opportunity to share updates about your health and any concerns you have, and receive a thorough all-over exam.   Most health insurance companies pay for at least some preventative services.  Check with your health plan for specific  coverages.  WHAT PREVENTATIVE SERVICES DO WOMEN NEED?  Adult women should have their weight and blood pressure checked regularly.   Women age 70 and older should have their cholesterol levels checked regularly.  Women should be screened for cervical cancer with a Pap smear and pelvic exam beginning at either age 55, or 3 years after they become sexually activity.    Breast cancer screening generally begins at age 12 with a mammogram and breast exam by your primary care provider.    Beginning at age 79 and continuing to age 34, women should be screened for colorectal cancer.  Certain people may need continued testing until age 19.  Updating vaccinations is part of preventative care.  Vaccinations help protect against diseases such as the flu.  Osteoporosis is a disease in which the bones lose minerals and strength as we age. Women ages 31 and over should discuss this with their caregivers, as should women after menopause who have other risk factors.  Lab tests are generally done as part of preventative care to screen for anemia and blood disorders, to screen for problems with the kidneys and liver, to screen for bladder problems, to check blood sugar, and to check your cholesterol level.  Preventative services generally include counseling about diet, exercise, avoiding tobacco, drugs, excessive alcohol consumption, and sexually transmitted infections.    GENERAL RECOMMENDATIONS FOR GOOD HEALTH:  Healthy diet:  Eat a variety of foods, including fruit, vegetables, animal or vegetable protein, such as meat, fish, chicken,  and eggs, or beans, lentils, tofu, and grains, such as rice.  Drink plenty of water daily.  Decrease saturated fat in the diet, avoid lots of red meat, processed foods, sweets, fast foods, and fried foods.  Exercise:  Aerobic exercise helps maintain good heart health. At least 30-40 minutes of moderate-intensity exercise is recommended. For example, a brisk walk that  increases your heart rate and breathing. This should be done on most days of the week.   Find a type of exercise or a variety of exercises that you enjoy so that it becomes a part of your daily life.  Examples are running, walking, swimming, water aerobics, and biking.  For motivation and support, explore group exercise such as aerobic class, spin class, Zumba, Yoga,or  martial arts, etc.    Set exercise goals for yourself, such as a certain weight goal, walk or run in a race such as a 5k walk/run.  Speak to your primary care provider about exercise goals.  Disease prevention:  If you smoke or chew tobacco, find out from your caregiver how to quit. It can literally save your life, no matter how long you have been a tobacco user. If you do not use tobacco, never begin.   Maintain a healthy diet and normal weight. Increased weight leads to problems with blood pressure and diabetes.   The Body Mass Index or BMI is a way of measuring how much of your body is fat. Having a BMI above 27 increases the risk of heart disease, diabetes, hypertension, stroke and other problems related to obesity. Your caregiver can help determine your BMI and based on it develop an exercise and dietary program to help you achieve or maintain this important measurement at a healthful level.  High blood pressure causes heart and blood vessel problems.  Persistent high blood pressure should be treated with medicine if weight loss and exercise do not work.   Fat and cholesterol leaves deposits in your arteries that can block them. This causes heart disease and vessel disease elsewhere in your body.  If your cholesterol is found to be high, or if you have heart disease or certain other medical conditions, then you may need to have your cholesterol monitored frequently and be treated with medication.   Ask if you should have a cardiac stress test if your history suggests this. A stress test is a test done on a treadmill that looks  for heart disease. This test can find disease prior to there being a problem.  Menopause can be associated with physical symptoms and risks. Hormone replacement therapy is available to decrease these. You should talk to your caregiver about whether starting or continuing to take hormones is right for you.   Osteoporosis is a disease in which the bones lose minerals and strength as we age. This can result in serious bone fractures. Risk of osteoporosis can be identified using a bone density scan. Women ages 66 and over should discuss this with their caregivers, as should women after menopause who have other risk factors. Ask your caregiver whether you should be taking a calcium supplement and Vitamin D, to reduce the rate of osteoporosis.   Avoid drinking alcohol in excess (more than two drinks per day).  Avoid use of street drugs. Do not share needles with anyone. Ask for professional help if you need assistance or instructions on stopping the use of alcohol, cigarettes, and/or drugs.  Brush your teeth twice a day with fluoride toothpaste, and floss once  a day. Good oral hygiene prevents tooth decay and gum disease. The problems can be painful, unattractive, and can cause other health problems. Visit your dentist for a routine oral and dental check up and preventive care every 6-12 months.   Look at your skin regularly.  Use a mirror to look at your back. Notify your caregivers of changes in moles, especially if there are changes in shapes, colors, a size larger than a pencil eraser, an irregular border, or development of new moles.  Safety:  Use seatbelts 100% of the time, whether driving or as a passenger.  Use safety devices such as hearing protection if you work in environments with loud noise or significant background noise.  Use safety glasses when doing any work that could send debris in to the eyes.  Use a helmet if you ride a bike or motorcycle.  Use appropriate safety gear for contact sports.   Talk to your caregiver about gun safety.  Use sunscreen with a SPF (or skin protection factor) of 15 or greater.  Lighter skinned people are at a greater risk of skin cancer. Don't forget to also wear sunglasses in order to protect your eyes from too much damaging sunlight. Damaging sunlight can accelerate cataract formation.   Practice safe sex. Use condoms. Condoms are used for birth control and to help reduce the spread of sexually transmitted infections (or STIs).  Some of the STIs are gonorrhea (the clap), chlamydia, syphilis, trichomonas, herpes, HPV (human papilloma virus) and HIV (human immunodeficiency virus) which causes AIDS. The herpes, HIV and HPV are viral illnesses that have no cure. These can result in disability, cancer and death.   Keep carbon monoxide and smoke detectors in your home functioning at all times. Change the batteries every 6 months or use a model that plugs into the wall.   Vaccinations:  Stay up to date with your tetanus shots and other required immunizations. You should have a booster for tetanus every 10 years. Be sure to get your flu shot every year, since 5%-20% of the [place] population comes down with the flu. The flu vaccine changes each year, so being vaccinated once is not enough. Get your shot in the fall, before the flu season peaks.   Other vaccines to consider:  Human Papilloma Virus or HPV causes cancer of the cervix, and other infections that can be transmitted from person to person. There is a vaccine for HPV, and females should get immunized between the ages of 3 and 62. It requires a series of 3 shots.   Pneumococcal vaccine to protect against certain types of pneumonia.  This is normally recommended for adults age 14 or older.  However, adults younger than 51 years old with certain underlying conditions such as diabetes, heart or lung disease should also receive the vaccine.  Shingles vaccine to protect against Varicella Zoster if you are older than  age 7, or younger than 51 years old with certain underlying illness.  If you have not had the Shingrix vaccine, please call your insurer to inquire about coverage for the Shingrix vaccine given in 2 doses.   Some insurers cover this vaccine after age 58, some cover this after age 22.  If your insurer covers this, then call to schedule appointment to have this vaccine here  Hepatitis A vaccine to protect against a form of infection of the liver by a virus acquired from food.  Hepatitis B vaccine to protect against a form of infection of the liver  by a virus acquired from blood or body fluids, particularly if you work in health care.  If you plan to travel internationally, check with your local health department for specific vaccination recommendations.  Cancer Screening:  Breast cancer screening is essential to preventive care for women. All women age 83 and older should perform a breast self-exam every month. At age 28 and older, women should have their caregiver complete a breast exam each year. Women at ages 30 and older should have a mammogram (x-ray film) of the breasts. Your caregiver can discuss how often you need mammograms.    Cervical cancer screening includes taking a Pap smear (sample of cells examined under a microscope) from the cervix (end of the uterus). It also includes testing for HPV (Human Papilloma Virus, which can cause cervical cancer). Screening and a pelvic exam should begin at age 2, or 3 years after a woman becomes sexually active. Screening should occur every year, with a Pap smear but no HPV testing, up to age 55. After age 73, you should have a Pap smear every 3 years with HPV testing, if no HPV was found previously.   Most routine colon cancer screening begins at the age of 4. On a yearly basis, doctors may provide special easy to use take-home tests to check for hidden blood in the stool. Sigmoidoscopy or colonoscopy can detect the earliest forms of colon cancer and is  life saving. These tests use a small camera at the end of a tube to directly examine the colon. Speak to your caregiver about this at age 34, when routine screening begins (and is repeated every 5 years unless early forms of pre-cancerous polyps or small growths are found).

## 2019-01-20 NOTE — Telephone Encounter (Signed)
Call [facility] imaging about CT from 2018 abdomen and pelvis.  Do we need to do a bone scan versus CT chest abdomen pelvis?

## 2019-01-21 ENCOUNTER — Encounter (INDEPENDENT_AMBULATORY_CARE_PROVIDER_SITE_OTHER): Payer: Self-pay | Admitting: Bariatrics

## 2019-01-21 ENCOUNTER — Ambulatory Visit (INDEPENDENT_AMBULATORY_CARE_PROVIDER_SITE_OTHER): Payer: BC Managed Care – PPO | Admitting: Bariatrics

## 2019-01-21 ENCOUNTER — Other Ambulatory Visit: Payer: Self-pay

## 2019-01-21 ENCOUNTER — Other Ambulatory Visit: Payer: Self-pay | Admitting: Medical

## 2019-01-21 VITALS — BP 107/66 | HR 68 | Temp 97.8°F | Ht 63.0 in | Wt 250.0 lb

## 2019-01-21 DIAGNOSIS — E8881 Metabolic syndrome: Secondary | ICD-10-CM | POA: Diagnosis not present

## 2019-01-21 DIAGNOSIS — E559 Vitamin D deficiency, unspecified: Secondary | ICD-10-CM | POA: Diagnosis not present

## 2019-01-21 DIAGNOSIS — Z6841 Body Mass Index (BMI) 40.0 and over, adult: Secondary | ICD-10-CM | POA: Diagnosis not present

## 2019-01-21 DIAGNOSIS — R933 Abnormal findings on diagnostic imaging of other parts of digestive tract: Secondary | ICD-10-CM

## 2019-01-21 DIAGNOSIS — R590 Localized enlarged lymph nodes: Secondary | ICD-10-CM

## 2019-01-21 DIAGNOSIS — R319 Hematuria, unspecified: Secondary | ICD-10-CM

## 2019-01-21 NOTE — Telephone Encounter (Signed)
I called and spoke to radiologist.  After we looked at her scans, we both agree to have her do a CT chest, abdomen and pelvis to make sure we're not dealing with anything unusual.  Please set this up

## 2019-01-21 NOTE — Telephone Encounter (Signed)
Orders put in [place].

## 2019-01-22 NOTE — Progress Notes (Signed)
Office: [phone]  /  Fax: [phone]   HPI:   Chief Complaint: OBESITY Margaret Porter is here to discuss her progress with her obesity treatment plan. She is on the Category 3 plan and is following her eating plan approximately 80 % of the time. She states she is walking 7,000 to 8,1000 steps per day 5 times per week. Margaret Porter is doing well overall. She is not eating everything on the plan. Her weight is 250 lb (113.4 kg) today and has had a weight loss of 2 pounds over a period of 2 weeks since her last visit. She has lost 12 lbs since starting treatment with [name].  Vitamin D deficiency Margaret Porter has a diagnosis of vitamin D deficiency. She is currently taking high dose vit D and denies nausea, vomiting or muscle weakness.  Insulin Resistance Margaret Porter has a diagnosis of insulin resistance based on her elevated fasting insulin level >5. Although Allena's blood glucose readings are still under good control, insulin resistance puts her at greater risk of metabolic syndrome and diabetes. She is not taking medications currently and continues to work on diet and exercise to decrease risk of diabetes. Margaret Porter denies polyphagia.  ASSESSMENT AND PLAN:  Vitamin D deficiency  Insulin resistance  Class 3 severe obesity with serious comorbidity and body mass index (BMI) of 40.0 to 44.9 in adult, unspecified obesity type ([name])  PLAN:  Vitamin D Deficiency Margaret Porter was informed that low vitamin D levels contributes to fatigue and are associated with obesity, breast, and colon cancer. She agrees to continue to take prescription Vit D @50 ,000 IU every week and will follow up for routine testing of vitamin D, at least 2-3 times per year. She was informed of the risk of over-replacement of vitamin D and agrees to not increase her dose unless she discusses this with [name] first.  Insulin Resistance Margaret Porter will continue to work on weight loss, exercise, increasing lean protein and decreasing simple  carbohydrates in her diet to help decrease the risk of diabetes. She was informed that eating too many simple carbohydrates or too many calories at one sitting increases the likelihood of GI side effects. Margaret Porter agreed to follow up with [name] as directed to monitor her progress.  I spent > than 50% of the 15 minute visit on counseling as documented in the note.  Obesity Margaret Porter is currently in the action stage of change. As such, her goal is to continue with weight loss efforts She has agreed to follow the Category 3 plan Margaret Porter has been instructed to work up to a goal of 150 minutes of combined cardio and strengthening exercise per week for weight loss and overall health benefits. We discussed the following Behavioral Modification Strategies today: increase H2O intake, no skipping meals, keeping healthy foods in the home, increasing lean protein intake, decreasing simple carbohydrates, increasing vegetables and work on meal planning and easy cooking plans We discussed low calorie sweets  Margaret Porter has agreed to follow up with our clinic in 2 weeks. She was informed of the importance of frequent follow up visits to maximize her success with intensive lifestyle modifications for her multiple health conditions.  ALLERGIES: Allergies  Allergen Reactions  . Molds & Smuts   . Oxycodone     Questionable mouth itching, sweats, but was also simultaneously on Cipro, Flagyl, and Robaxin.  Margaret Porter Kitchen Red Dye   . Amoxicillin Rash  . Aspirin Anxiety  . Sulfamethoxazole-Trimethoprim Rash    MEDICATIONS: Current Outpatient Medications on File Prior to Visit  Medication Sig Dispense Refill  . diphenhydrAMINE (BENADRYL) 25 MG tablet Take 25 mg by mouth every 6 (six) hours as needed for itching or allergies. Reported on 02/29/2016    . GENVOYA 150-150-200-10 MG TABS tablet TAKE ONE TABLET BY MOUTH ONCE DAILY WITH BREAKFAST. STORE IN ORIGINALCONTAINER AT ROOM TEMPERATURE. 30 tablet 2  . Vitamin D, Ergocalciferol,  (DRISDOL) 1.25 MG (50000 UT) CAPS capsule Take 1 capsule (50,000 Units total) by mouth every 7 (seven) days. 4 capsule 0   No current facility-administered medications on file prior to visit.     PAST MEDICAL HISTORY: Past Medical History:  Diagnosis Date  . Allergic rhinitis   . Anemia   . Food allergy   . HIV infection ([place]) 2006  . Idiopathic urticaria   . Joint pain   . Obesity   . Pre-diabetes   . Rosacea   . Swelling of both lower extremities   . Vitamin D deficiency     PAST SURGICAL HISTORY: Past Surgical History:  Procedure Laterality Date  . APPENDECTOMY    . DILATATION & CURRETTAGE/HYSTEROSCOPY WITH RESECTOCOPE N/A 06/27/2015   Procedure: DILATATION & CURETTAGE/HYSTEROSCOPY WITH RESECTOCOPE;  Surgeon: Servando Salina, MD;  Location: [facility];  Service: Gynecology;  Laterality: N/A;  . LAPAROSCOPIC APPENDECTOMY N/A 09/07/2017   Procedure: APPENDECTOMY LAPAROSCOPIC;  Surgeon: Clovis Riley, MD;  Location: [facility];  Service: General;  Laterality: N/A;    SOCIAL HISTORY: Social History   Tobacco Use  . Smoking status: Never Smoker  . Smokeless tobacco: Never Used  Substance Use Topics  . Alcohol use: Yes    Alcohol/week: 0.0 standard drinks    Comment: occasional (special occasions)  . Drug use: No    FAMILY HISTORY: Family History  Problem Relation Age of Onset  . Diabetes Mother   . Diabetes Brother   . Heart disease Paternal Uncle   . Heart disease Maternal Grandmother   . Cancer Maternal Grandmother   . Heart disease Paternal Grandmother   . Diabetes Father   . High blood pressure Father     ROS: Review of Systems  Constitutional: Positive for weight loss.  Gastrointestinal: Negative for nausea and vomiting.  Musculoskeletal:       Negative for muscle weakness  Endo/Heme/Allergies:       Negative for polyphagia    PHYSICAL EXAM: Blood pressure 107/66, pulse 68, temperature 97.8 F (36.6 C), temperature source Oral, height 5\' 3"  (1.6 m),  weight 250 lb (113.4 kg), last menstrual period 01/18/2019, SpO2 97 %. Body mass index is 44.29 kg/m. Physical Exam Vitals signs reviewed.  Constitutional:      Appearance: Normal appearance. She is well-developed. She is obese.  Cardiovascular:     Rate and Rhythm: Normal rate.  Pulmonary:     Effort: Pulmonary effort is normal.  Musculoskeletal: Normal range of motion.  Skin:    General: Skin is warm and dry.  Neurological:     Mental Status: She is alert and oriented to person, place, and time.  Psychiatric:        Mood and Affect: Mood normal.        Behavior: Behavior normal.     RECENT LABS AND TESTS: BMET    Component Value Date/Time   NA 140 12/02/2018 0810   K 4.4 12/02/2018 0810   CL 108 12/02/2018 0810   CO2 24 12/02/2018 0810   GLUCOSE 105 (H) 12/02/2018 0810   BUN 14 12/02/2018 0810   CREATININE 0.97 12/02/2018 0810  CALCIUM 9.1 12/02/2018 0810   GFRNONAA 68 12/02/2018 0810   GFRAA 79 12/02/2018 0810   Lab Results  Component Value Date   HGBA1C 5.3 12/02/2018   HGBA1C 5.4 11/12/2018   HGBA1C 5.3 02/14/2018   HGBA1C 5.1 04/04/2017   HGBA1C 5.7 (H) 02/13/2016   Lab Results  Component Value Date   INSULIN 21.1 11/12/2018   CBC    Component Value Date/Time   WBC 4.9 12/02/2018 0810   RBC 4.26 12/02/2018 0810   HGB 13.3 12/02/2018 0810   HGB 14.1 11/12/2018 0938   HCT 39.4 12/02/2018 0810   HCT 42.9 11/12/2018 0938   PLT 301 12/02/2018 0810   PLT 257 11/12/2018 0938   MCV 92.5 12/02/2018 0810   MCV 94 11/12/2018 0938   MCH 31.2 12/02/2018 0810   MCHC 33.8 12/02/2018 0810   RDW 13.7 12/02/2018 0810   RDW 13.2 11/12/2018 0938   LYMPHSABS 1,495 12/02/2018 0810   LYMPHSABS 0.9 11/12/2018 0938   MONOABS 0.5 02/13/2016 0923   EOSABS 29 12/02/2018 0810   EOSABS 0.0 11/12/2018 0938   BASOSABS 20 12/02/2018 0810   BASOSABS 0.0 11/12/2018 0938   Iron/TIBC/Ferritin/ %Sat No results found for: IRON, TIBC, FERRITIN, IRONPCTSAT Lipid Panel       Component Value Date/Time   CHOL 149 12/02/2018 0810   TRIG 95 12/02/2018 0810   HDL 34 (L) 12/02/2018 0810   CHOLHDL 4.4 12/02/2018 0810   VLDL 24 04/04/2017 0935   LDLCALC 96 12/02/2018 0810   Hepatic Function Panel     Component Value Date/Time   PROT 6.0 (L) 12/02/2018 0810   ALBUMIN 3.4 (L) 09/07/2017 1638   AST 17 12/02/2018 0810   ALT 17 12/02/2018 0810   ALKPHOS 102 09/07/2017 1638   BILITOT 0.4 12/02/2018 0810   BILIDIR 0.1 12/22/2015 0001   IBILI 0.3 12/22/2015 0001      Component Value Date/Time   TSH 2.170 11/12/2018 0938   TSH 1.985 11/29/2014 1029     Ref. Range 11/12/2018 09:38  Vitamin D, 25-Hydroxy Latest Ref Range: 30.0 - 100.0 ng/mL 10.0 (L)     OBESITY BEHAVIORAL INTERVENTION VISIT  Today's visit was # 6   Starting weight: 262 lbs Starting date: 11/12/2018 Today's weight : 250 lbs  Today's date: 01/21/2019 Total lbs lost to date: 12   ASK: We discussed the diagnosis of obesity with Judieth Keens today and Saamiya agreed to give [name] permission to discuss obesity behavioral modification therapy today.  ASSESS: Ylianna has the diagnosis of obesity and her BMI today is 44.3 Kalie is in the action stage of change   ADVISE: Margorie was educated on the multiple health risks of obesity as well as the benefit of weight loss to improve her health. She was advised of the need for long term treatment and the importance of lifestyle modifications to improve her current health and to decrease her risk of future health problems.  AGREE: Multiple dietary modification options and treatment options were discussed and  Maddix agreed to follow the recommendations documented in the above note.  ARRANGE: Ysidra was educated on the importance of frequent visits to treat obesity as outlined per CMS and USPSTF guidelines and agreed to schedule her next follow up appointment today.  Corey Skains, am acting as Location manager for [employer]. Owens Shark,  DO  I have reviewed the above documentation for accuracy and completeness, and I agree with the above. -Jearld Lesch, DO

## 2019-01-26 NOTE — Telephone Encounter (Signed)
See her email she sent me about last paps that are scanned in the email.  Update Abstraction for pap

## 2019-01-29 ENCOUNTER — Other Ambulatory Visit: Payer: BC Managed Care – PPO

## 2019-01-29 ENCOUNTER — Ambulatory Visit
Admission: RE | Admit: 2019-01-29 | Discharge: 2019-01-29 | Disposition: A | Discharge status: Home / Self Care | Payer: BC Managed Care – PPO | Source: Ambulatory Visit | Attending: Medical | Admitting: Medical

## 2019-01-29 ENCOUNTER — Telehealth: Payer: Self-pay

## 2019-01-29 DIAGNOSIS — R590 Localized enlarged lymph nodes: Secondary | ICD-10-CM

## 2019-01-29 DIAGNOSIS — R933 Abnormal findings on diagnostic imaging of other parts of digestive tract: Secondary | ICD-10-CM

## 2019-01-29 MED ORDER — IOPAMIDOL (ISOVUE-300) INJECTION 61%
100.0000 mL | Freq: Once | INTRAVENOUS | Status: AC | PRN
  Administered 2019-01-29: 100 mL via INTRAVENOUS

## 2019-01-29 NOTE — Telephone Encounter (Signed)
Prior Authorization for CT scan of the abdomen and pelvis with contrast  #[id number] valid 01-28-19 to 02-26-19.

## 2019-02-03 ENCOUNTER — Other Ambulatory Visit: Payer: Self-pay

## 2019-02-03 ENCOUNTER — Ambulatory Visit: Payer: BC Managed Care – PPO | Admitting: Medical

## 2019-02-03 ENCOUNTER — Telehealth: Payer: Self-pay | Admitting: Medical

## 2019-02-03 ENCOUNTER — Encounter: Payer: Self-pay | Admitting: Medical

## 2019-02-03 VITALS — BP 130/80 | HR 70 | Temp 98.1°F | Resp 16 | Ht 63.0 in | Wt 260.2 lb

## 2019-02-03 DIAGNOSIS — Q782 Osteopetrosis: Secondary | ICD-10-CM | POA: Diagnosis not present

## 2019-02-03 DIAGNOSIS — B20 Human immunodeficiency virus [HIV] disease: Secondary | ICD-10-CM

## 2019-02-03 DIAGNOSIS — R935 Abnormal findings on diagnostic imaging of other abdominal regions, including retroperitoneum: Secondary | ICD-10-CM

## 2019-02-03 DIAGNOSIS — M7662 Achilles tendinitis, left leg: Secondary | ICD-10-CM | POA: Insufficient documentation

## 2019-02-03 DIAGNOSIS — E559 Vitamin D deficiency, unspecified: Secondary | ICD-10-CM | POA: Diagnosis not present

## 2019-02-03 DIAGNOSIS — M5136 Other intervertebral disc degeneration, lumbar region: Secondary | ICD-10-CM

## 2019-02-03 NOTE — Addendum Note (Signed)
Addended by: Carlena Hurl on: 02/03/2019 01:56 PM   Modules accepted: Orders

## 2019-02-03 NOTE — Telephone Encounter (Signed)
I discussed her case yesterday with an oncologist, Dr. Alvy Bimler  She recommended we do a breast MRI and chest CT given the findings and she reviewed her chart yesterday.  I will call and discuss with radiology regarding the breast MRI.  If agreeable I will go ahead and order the chest CT.  I did call and speak to pathology just to verify pathology results from her recent breast biopsy.  They will review the slides again and call me tomorrow.   Cyndi -please send message back to me so I can comment

## 2019-02-03 NOTE — Progress Notes (Addendum)
Subjective:     Patient ID: Margaret Porter, female   DOB: 1970/09/28, 49 y.o.   MRN: [id number]  HPI [name] is a 49 year old African American female with a medical history including HIV disease on therapy, most recent viral load undetectable in December 2019, nonsmoker, hyperlipidemia, insulin resistance, vitamin D deficiency, rosacea, obesity, idiopathic urticaria, who presents for follow-up on abnormal CT results.  Diagnosed with HIV in 2003, been on medication since then.  I saw her recently for a physical, but after reviewing her chart history there was a prior abnormal CT abdomen pelvis from September 2018 that may have been lost to follow-up.  She was hospitalized back in September 2018 for appendicitis.   She never came in for hospital follow-up so I was not aware of the CT until her recent physical visit  The prior CT showed some sclerotic lesions of several bony areas, so we decided to do an updated scan.  Her insurance would not approve CT chest so we ended up getting just a CT abdomen and pelvis.  This is what she is here to discuss.  She denies hx/o sarcoidosis in self or family.   No personal or family hx/o paget's disease of bone.     She gets occasional back pain, but denies pain in general.  Nonsmoker. Denies cough, SOB. She did grow up in a smoker's house with mother, so had second hand smoke exposure growing up.  In general she reports no current concerns or symptoms other than left achilles pain for 1 week without injury.   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   No swelling, no numbness, no tingling, no fall.   Has an occasionally significant other.   Is sexually active at times, uses condoms.      Past Medical History:  Diagnosis Date  . Allergic rhinitis   . Anemia   . Food allergy   . HIV infection ([place]) 2006  . Idiopathic urticaria   . Joint pain   . Obesity   . Pre-diabetes   . Rosacea   . Swelling of both lower extremities   . Vitamin D deficiency     Current Outpatient Medications on File Prior to Visit  Medication Sig Dispense Refill  . diphenhydrAMINE (BENADRYL) 25 MG tablet Take 25 mg by mouth every 6 (six) hours as needed for itching or allergies. Reported on 02/29/2016    . GENVOYA 150-150-200-10 MG TABS tablet TAKE ONE TABLET BY MOUTH ONCE DAILY WITH BREAKFAST. STORE IN ORIGINALCONTAINER AT ROOM TEMPERATURE. 30 tablet 2  . Vitamin D, Ergocalciferol, (DRISDOL) 1.25 MG (50000 UT) CAPS capsule Take 1 capsule (50,000 Units total) by mouth every 7 (seven) days. 4 capsule 0   No current facility-administered medications on file prior to visit.    Past Surgical History:  Procedure Laterality Date  . APPENDECTOMY    . DILATATION & CURRETTAGE/HYSTEROSCOPY WITH RESECTOCOPE N/A 06/27/2015   Procedure: DILATATION & CURETTAGE/HYSTEROSCOPY WITH RESECTOCOPE;  Surgeon: Servando Salina, MD;  Location: [facility];  Service: Gynecology;  Laterality: N/A;  . LAPAROSCOPIC APPENDECTOMY N/A 09/07/2017   Procedure: APPENDECTOMY LAPAROSCOPIC;  Surgeon: Clovis Riley, MD;  Location: [facility];  Service: General;  Laterality: N/A;     Review of Systems     Objective:   Physical Exam  Gen: wd,wn, nad Tender left distal achilles, without swelling or deformity, no pain with resisted ankle dorsi or plantar flexion, otherwise foot ankle and lower leg nontender and without deformity Legs neurovascularly intact Otherwise  not examined, see recent complete physical exam 01/20/2019 visit     Assessment:     Encounter Diagnoses  Name Primary?  . Abnormal CT of the abdomen Yes  . Abnormal CT scan, pelvis   . Bony sclerosis   . Human immunodeficiency virus (HIV) disease ([place])   . Vitamin D deficiency   . Achilles tendinitis of left lower extremity   . Other intervertebral disc degeneration, lumbar region        Plan:     I reviewed recent labs in December 2019 shows unremarkable comprehensive metabolic panel, CBC, HIV with undetectable viral load, TSH  normal, alkaline phosphatase normal although ALP has been elevated in the past.  Vit D recently quite low  Her CT abdomen pelvis from 01/29/2019 impression as below IMPRESSION: 1. Numerous sclerotic osseous lesions, the majority of which are unchanged though with mild progression of a few lesions. These remain indeterminate with both malignant (metastatic disease and lymphoma) and benign conditions (sclerosing skeletal dysplasias and sarcoidosis for example) on the differential. 2. No other evidence of malignancy or acute abnormality in the abdomen or pelvis.  3. Non obstructing left renal calculus.  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   I discussed her case with supervising physician Dr. Redmond School.  We discussed possible differential that could include sarcoidosis, multiple myeloma, Paget's disease, some benign process.  Of note she is enrolled in a halo HIV study, and she recently had a finding of micro albumin in urine protein elevated although her other markers have been normal.  She also has vitamin D deficiency.  She is on therapy for vitamin D   We discussed next steps.  I made a call to hematology oncology to get some guidance given the CT findings.  We will await their call.   I recommended next steps discussed with Dr. Redmond School included chest x-ray, possible bone scan, labs as noted below.  Achilles tendinitis-discussed supportive care, rest, ice, stretching, and if not much improved within 7 to 10 days then recheck    Addendum: I discussed case with Dr. Heath Lark with oncology. She reviewed the chart and given the findings we discussed ruling out breast cancer lung cancer with imaging including MRI breast and chest CT.  Multi myeloma is considered but less likely diagnosis.  We will discuss with patient and move forward

## 2019-02-04 ENCOUNTER — Other Ambulatory Visit: Payer: Self-pay

## 2019-02-04 ENCOUNTER — Encounter (INDEPENDENT_AMBULATORY_CARE_PROVIDER_SITE_OTHER): Payer: Self-pay | Admitting: Bariatrics

## 2019-02-04 ENCOUNTER — Other Ambulatory Visit: Payer: Self-pay | Admitting: Internal Medicine

## 2019-02-04 ENCOUNTER — Ambulatory Visit (INDEPENDENT_AMBULATORY_CARE_PROVIDER_SITE_OTHER): Payer: BC Managed Care – PPO | Admitting: Bariatrics

## 2019-02-04 ENCOUNTER — Other Ambulatory Visit: Payer: Self-pay | Admitting: Medical

## 2019-02-04 ENCOUNTER — Encounter: Payer: Self-pay | Admitting: Gastroenterology

## 2019-02-04 VITALS — BP 120/77 | HR 69 | Temp 98.2°F | Ht 63.0 in | Wt 256.0 lb

## 2019-02-04 DIAGNOSIS — R590 Localized enlarged lymph nodes: Secondary | ICD-10-CM

## 2019-02-04 DIAGNOSIS — E559 Vitamin D deficiency, unspecified: Secondary | ICD-10-CM

## 2019-02-04 DIAGNOSIS — Z9189 Other specified personal risk factors, not elsewhere classified: Secondary | ICD-10-CM

## 2019-02-04 DIAGNOSIS — Z1211 Encounter for screening for malignant neoplasm of colon: Secondary | ICD-10-CM

## 2019-02-04 DIAGNOSIS — B20 Human immunodeficiency virus [HIV] disease: Secondary | ICD-10-CM

## 2019-02-04 DIAGNOSIS — Z6841 Body Mass Index (BMI) 40.0 and over, adult: Secondary | ICD-10-CM

## 2019-02-04 DIAGNOSIS — E8881 Metabolic syndrome: Secondary | ICD-10-CM | POA: Diagnosis not present

## 2019-02-04 DIAGNOSIS — Z1231 Encounter for screening mammogram for malignant neoplasm of breast: Secondary | ICD-10-CM

## 2019-02-04 LAB — VITAMIN D 25 HYDROXY (VIT D DEFICIENCY, FRACTURES): Vit D, 25-Hydroxy: 26.8 ng/mL — ABNORMAL LOW (ref 30.0–100.0)

## 2019-02-04 MED ORDER — VITAMIN D (ERGOCALCIFEROL) 1.25 MG (50000 UNIT) PO CAPS: 50000.0000 [IU] | ORAL_CAPSULE | ORAL | 0 refills | Status: DC

## 2019-02-04 MED ORDER — METFORMIN HCL 500 MG PO TABS: 500.0000 mg | ORAL_TABLET | Freq: Every day | ORAL | 0 refills | Status: DC

## 2019-02-04 NOTE — Telephone Encounter (Signed)
See prior message

## 2019-02-04 NOTE — Telephone Encounter (Signed)
Patient notified and Audelia Acton also talked to patient.

## 2019-02-04 NOTE — Telephone Encounter (Signed)
See message, tell her the message, THEN send back to me

## 2019-02-05 NOTE — Progress Notes (Signed)
Office: [phone]  /  Fax: [phone]   HPI:   Chief Complaint: OBESITY Margaret Porter is here to discuss her progress with her obesity treatment plan. She is on the Category 3 plan and is following her eating plan approximately 85 % of the time. She states she is exercising 0 minutes 0 times per week. Margaret Porter has struggled over the last few weeks. She has had more stress. Margaret Porter has been eating out more. Her weight is 256 lb (116.1 kg) today and has had a weight gain of 6 pounds over a period of 2 weeks since her last visit. She has lost 6 lbs since starting treatment with [name].  Vitamin D deficiency Margaret Porter has a diagnosis of vitamin D deficiency. She is currently taking high dose vit D and denies nausea, vomiting or muscle weakness.  At risk for osteopenia and osteoporosis Margaret Porter is at higher risk of osteopenia and osteoporosis due to vitamin D deficiency.   Insulin Resistance Margaret Porter has a diagnosis of insulin resistance based on her elevated fasting insulin level >5. Although Margaret Porter's blood glucose readings are still under good control, insulin resistance puts her at greater risk of metabolic syndrome and diabetes. Her last A1c was at 5.3 and last insulin level was at 21.1 She is not taking metformin currently and continues to work on diet and exercise to decrease risk of diabetes. Margaret Porter has had no excessive hunger.  ASSESSMENT AND PLAN:  Vitamin D deficiency - Plan: Vitamin D, Ergocalciferol, (DRISDOL) 1.25 MG (50000 UT) CAPS capsule  Insulin resistance - Plan: metFORMIN (GLUCOPHAGE) 500 MG tablet  At risk for osteoporosis  Class 3 severe obesity with serious comorbidity and body mass index (BMI) of 45.0 to 49.9 in adult, unspecified obesity type ([place])  PLAN:  Vitamin D Deficiency Margaret Porter was informed that low vitamin D levels contributes to fatigue and are associated with obesity, breast, and colon cancer. She agrees to continue to take prescription Vit D @50 ,000  IU every week #4 with no refills and will follow up for routine testing of vitamin D, at least 2-3 times per year. She was informed of the risk of over-replacement of vitamin D and agrees to not increase her dose unless she discusses this with [name] first. Margaret Porter agrees to follow up as directed.  At risk for osteopenia and osteoporosis Margaret Porter was given extended  (15 minutes) osteoporosis prevention counseling today. Margaret Porter is at risk for osteopenia and osteoporosis due to her vitamin D deficiency. She was encouraged to take her vitamin D and follow her higher calcium diet and increase strengthening exercise to help strengthen her bones and decrease her risk of osteopenia and osteoporosis.  Insulin Resistance Margaret Porter will continue to work on weight loss, exercise, and decreasing simple carbohydrates in her diet to help decrease the risk of diabetes. We dicussed metformin including benefits and risks. She was informed that eating too many simple carbohydrates or too many calories at one sitting increases the likelihood of GI side effects. Margaret Porter agreed to start metformin 500 mg once in the morning with breakfast #30 with no refills and follow up with [name] as directed to monitor her progress.  Obesity Margaret Porter is currently in the action stage of change. As such, her goal is to continue with weight loss efforts She has agreed to follow the Category 3 plan Margaret Porter has been instructed to work up to a goal of 150 minutes of combined cardio and strengthening exercise per week for weight loss and overall health benefits. We discussed the  following Behavioral Modification Strategies today: increase H2O intake, keeping healthy foods in the home, increasing lean protein intake, decreasing simple carbohydrates, increasing vegetables, decrease eating out, no skipping meals, and work on meal planning and easy cooking plans Additional lunch options were given to patient today.  Margaret Porter has agreed to follow up  with our clinic in 2 weeks. She was informed of the importance of frequent follow up visits to maximize her success with intensive lifestyle modifications for her multiple health conditions.  ALLERGIES: Allergies  Allergen Reactions  . Molds & Smuts   . Oxycodone     Questionable mouth itching, sweats, but was also simultaneously on Cipro, Flagyl, and Robaxin.  [name] Kitchen Red Dye   . Amoxicillin Rash  . Aspirin Anxiety  . Sulfamethoxazole-Trimethoprim Rash    MEDICATIONS: Current Outpatient Medications on File Prior to Visit  Medication Sig Dispense Refill  . diphenhydrAMINE (BENADRYL) 25 MG tablet Take 25 mg by mouth every 6 (six) hours as needed for itching or allergies. Reported on 02/29/2016     No current facility-administered medications on file prior to visit.     PAST MEDICAL HISTORY: Past Medical History:  Diagnosis Date  . Allergic rhinitis   . Anemia   . Food allergy   . HIV infection ([place]) 2006  . Idiopathic urticaria   . Joint pain   . Obesity   . Pre-diabetes   . Rosacea   . Swelling of both lower extremities   . Vitamin D deficiency     PAST SURGICAL HISTORY: Past Surgical History:  Procedure Laterality Date  . APPENDECTOMY    . DILATATION & CURRETTAGE/HYSTEROSCOPY WITH RESECTOCOPE N/A 06/27/2015   Procedure: DILATATION & CURETTAGE/HYSTEROSCOPY WITH RESECTOCOPE;  Surgeon: Servando Salina, MD;  Location: [facility];  Service: Gynecology;  Laterality: N/A;  . LAPAROSCOPIC APPENDECTOMY N/A 09/07/2017   Procedure: APPENDECTOMY LAPAROSCOPIC;  Surgeon: Clovis Riley, MD;  Location: [facility];  Service: General;  Laterality: N/A;    SOCIAL HISTORY: Social History   Tobacco Use  . Smoking status: Never Smoker  . Smokeless tobacco: Never Used  Substance Use Topics  . Alcohol use: Yes    Alcohol/week: 0.0 standard drinks    Comment: occasional (special occasions)  . Drug use: No    FAMILY HISTORY: Family History  Problem Relation Age of Onset  . Diabetes Mother     . Diabetes Brother   . Heart disease Paternal Uncle   . Heart disease Maternal Grandmother   . Cancer Maternal Grandmother   . Heart disease Paternal Grandmother   . Diabetes Father   . High blood pressure Father     ROS: Review of Systems  Constitutional: Negative for weight loss.  Gastrointestinal: Negative for nausea and vomiting.  Musculoskeletal:       Negative for muscle weakness  Endo/Heme/Allergies:       Negative for polyphagia    PHYSICAL EXAM: Blood pressure 120/77, pulse 69, temperature 98.2 F (36.8 C), temperature source Oral, height 5\' 3"  (1.6 m), weight 256 lb (116.1 kg), last menstrual period 01/18/2019, SpO2 97 %. Body mass index is 45.35 kg/m. Physical Exam Vitals signs reviewed.  Constitutional:      Appearance: Normal appearance. She is well-developed. She is obese.  Cardiovascular:     Rate and Rhythm: Normal rate.  Pulmonary:     Effort: Pulmonary effort is normal.  Musculoskeletal: Normal range of motion.  Skin:    General: Skin is warm and dry.  Neurological:     Mental  Status: She is alert and oriented to person, place, and time.  Psychiatric:        Mood and Affect: Mood normal.        Behavior: Behavior normal.     RECENT LABS AND TESTS: BMET    Component Value Date/Time   NA 140 12/02/2018 0810   K 4.4 12/02/2018 0810   CL 108 12/02/2018 0810   CO2 24 12/02/2018 0810   GLUCOSE 105 (H) 12/02/2018 0810   BUN 14 12/02/2018 0810   CREATININE 0.97 12/02/2018 0810   CALCIUM 9.1 12/02/2018 0810   GFRNONAA 68 12/02/2018 0810   GFRAA 79 12/02/2018 0810   Lab Results  Component Value Date   HGBA1C 5.3 12/02/2018   HGBA1C 5.4 11/12/2018   HGBA1C 5.3 02/14/2018   HGBA1C 5.1 04/04/2017   HGBA1C 5.7 (H) 02/13/2016   Lab Results  Component Value Date   INSULIN 21.1 11/12/2018   CBC    Component Value Date/Time   WBC 4.9 12/02/2018 0810   RBC 4.26 12/02/2018 0810   HGB 13.3 12/02/2018 0810   HGB 14.1 11/12/2018 0938   HCT  39.4 12/02/2018 0810   HCT 42.9 11/12/2018 0938   PLT 301 12/02/2018 0810   PLT 257 11/12/2018 0938   MCV 92.5 12/02/2018 0810   MCV 94 11/12/2018 0938   MCH 31.2 12/02/2018 0810   MCHC 33.8 12/02/2018 0810   RDW 13.7 12/02/2018 0810   RDW 13.2 11/12/2018 0938   LYMPHSABS 1,495 12/02/2018 0810   LYMPHSABS 0.9 11/12/2018 0938   MONOABS 0.5 02/13/2016 0923   EOSABS 29 12/02/2018 0810   EOSABS 0.0 11/12/2018 0938   BASOSABS 20 12/02/2018 0810   BASOSABS 0.0 11/12/2018 0938   Iron/TIBC/Ferritin/ %Sat No results found for: IRON, TIBC, FERRITIN, IRONPCTSAT Lipid Panel     Component Value Date/Time   CHOL 149 12/02/2018 0810   TRIG 95 12/02/2018 0810   HDL 34 (L) 12/02/2018 0810   CHOLHDL 4.4 12/02/2018 0810   VLDL 24 04/04/2017 0935   LDLCALC 96 12/02/2018 0810   Hepatic Function Panel     Component Value Date/Time   PROT 6.0 (L) 12/02/2018 0810   ALBUMIN 3.4 (L) 09/07/2017 1638   AST 17 12/02/2018 0810   ALT 17 12/02/2018 0810   ALKPHOS 102 09/07/2017 1638   BILITOT 0.4 12/02/2018 0810   BILIDIR 0.1 12/22/2015 0001   IBILI 0.3 12/22/2015 0001      Component Value Date/Time   TSH 2.170 11/12/2018 0938   TSH 1.985 11/29/2014 1029     Ref. Range 02/03/2019 09:20  Vitamin D, 25-Hydroxy Latest Ref Range: 30.0 - 100.0 ng/mL 26.8 (L)     OBESITY BEHAVIORAL INTERVENTION VISIT  Today's visit was # 7   Starting weight: 262 lbs Starting date: 11/12/2018 Today's weight : 256 lbs Today's date: 02/04/2019 Total lbs lost to date: 6   ASK: We discussed the diagnosis of obesity with Margaret Porter today and Margaret Porter agreed to give [name] permission to discuss obesity behavioral modification therapy today.  ASSESS: Margaret Porter has the diagnosis of obesity and her BMI today is 45.36 Margaret Porter is in the action stage of change   ADVISE: Margaret Porter was educated on the multiple health risks of obesity as well as the benefit of weight loss to improve her health. She was advised of  the need for long term treatment and the importance of lifestyle modifications to improve her current health and to decrease her risk of future health problems.  AGREE:  Multiple dietary modification options and treatment options were discussed and  Margaret Porter agreed to follow the recommendations documented in the above note.  ARRANGE: Margaret Porter was educated on the importance of frequent visits to treat obesity as outlined per CMS and USPSTF guidelines and agreed to schedule her next follow up appointment today.  Corey Skains, am acting as Location manager for [employer]. Owens Shark, DO  I have reviewed the above documentation for accuracy and completeness, and I agree with the above. -Jearld Lesch, DO

## 2019-02-05 NOTE — Telephone Encounter (Signed)
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  Lets do this first before pursuing breast MRI  Also, if blood still available, see if Verdis Frederickson can add SPEP, serum protein electrophoresis.

## 2019-02-09 ENCOUNTER — Encounter (INDEPENDENT_AMBULATORY_CARE_PROVIDER_SITE_OTHER): Payer: Self-pay | Admitting: Bariatrics

## 2019-02-10 LAB — PROTEIN ELECTROPHORESIS
A/G Ratio: 1.7 (ref 0.7–1.7)
Albumin ELP: 3.8 g/dL (ref 2.9–4.4)
Alpha 1: 0.2 g/dL (ref 0.0–0.4)
Alpha 2: 0.7 g/dL (ref 0.4–1.0)
Beta: 0.8 g/dL (ref 0.7–1.3)
GLOBULIN, TOTAL: 2.2 g/dL (ref 2.2–3.9)
Gamma Globulin: 0.5 g/dL (ref 0.4–1.8)
TOTAL PROTEIN: 6 g/dL (ref 6.0–8.5)

## 2019-02-10 LAB — SPECIMEN STATUS REPORT

## 2019-02-10 NOTE — Telephone Encounter (Signed)
SPEP added.

## 2019-02-11 ENCOUNTER — Ambulatory Visit
Admission: RE | Admit: 2019-02-11 | Discharge: 2019-02-11 | Disposition: A | Discharge status: Home / Self Care | Payer: BC Managed Care – PPO | Source: Ambulatory Visit | Attending: Medical | Admitting: Medical

## 2019-02-11 DIAGNOSIS — R933 Abnormal findings on diagnostic imaging of other parts of digestive tract: Secondary | ICD-10-CM

## 2019-02-11 MED ORDER — IOPAMIDOL (ISOVUE-300) INJECTION 61%
75.0000 mL | Freq: Once | INTRAVENOUS | Status: AC | PRN
  Administered 2019-02-11: 75 mL via INTRAVENOUS

## 2019-02-13 ENCOUNTER — Other Ambulatory Visit: Payer: Self-pay | Admitting: Occupational Medicine

## 2019-02-13 ENCOUNTER — Ambulatory Visit: Payer: Self-pay

## 2019-02-13 DIAGNOSIS — M25572 Pain in left ankle and joints of left foot: Secondary | ICD-10-CM

## 2019-02-16 ENCOUNTER — Telehealth: Payer: Self-pay | Admitting: Medical

## 2019-02-16 ENCOUNTER — Encounter (INDEPENDENT_AMBULATORY_CARE_PROVIDER_SITE_OTHER): Payer: BC Managed Care – PPO | Admitting: *Deleted

## 2019-02-16 VITALS — BP 136/89 | HR 73 | Temp 98.4°F | Wt 249.5 lb

## 2019-02-16 DIAGNOSIS — Z006 Encounter for examination for normal comparison and control in clinical research program: Secondary | ICD-10-CM

## 2019-02-16 LAB — COMPREHENSIVE METABOLIC PANEL
AG Ratio: 2 (calc) (ref 1.0–2.5)
ALT: 17 U/L (ref 6–29)
AST: 17 U/L (ref 10–35)
Albumin: 4.4 g/dL (ref 3.6–5.1)
Alkaline phosphatase (APISO): 114 U/L (ref 37–153)
BUN: 12 mg/dL (ref 7–25)
CO2: 26 mmol/L (ref 20–32)
Calcium: 9.6 mg/dL (ref 8.6–10.4)
Chloride: 104 mmol/L (ref 98–110)
Creat: 0.99 mg/dL (ref 0.50–1.05)
GLUCOSE: 97 mg/dL (ref 65–99)
Globulin: 2.2 g/dL (calc) (ref 1.9–3.7)
Potassium: 4.4 mmol/L (ref 3.5–5.3)
Sodium: 139 mmol/L (ref 135–146)
Total Bilirubin: 0.3 mg/dL (ref 0.2–1.2)
Total Protein: 6.6 g/dL (ref 6.1–8.1)

## 2019-02-16 NOTE — Research (Signed)
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 She has been referred to oncology and is waiting on that appointment. She had a breast biopsy recently that was benign, so unaware of any other primary sites. She has had very low vitamin D levels in the past and has resumed her vitamin D now. She denies any new problems and only reports occassional pain in her back and chest. She reconsented for version 4.0 of the study today which has been extended another 6 years. The other study she is on may be completed after her visit in October for that one. She will be returning in August for this study.

## 2019-02-16 NOTE — Telephone Encounter (Signed)
I have sent over fax to [facility] to be sent to radiologist for review. I have not called radiologist as they have to get the report first and you are not here today incase there is questions.

## 2019-02-16 NOTE — Telephone Encounter (Signed)
Please call [facility].  I would like the radiologist to review the recent chest CT from [place] in light of the 10/2018 and 11/2018 mammograms.   Please send copy of chest CT if they don't have access to this.   She already had a biopsy of the right breast, but the CT comments on a mass in the left breast.    I need some clarification on this.

## 2019-02-17 NOTE — Telephone Encounter (Signed)
I spoke with radiology at [facility].  The radiologist look back and mammograms from last fall 2019 and she does have a benign stable left breast mass and they did not comment on that this year because it has been stable and benign for several years.  So at this point before we do any other testing I think it is time to have oncology weigh in and give their recommendations.  Let us refer to oncology Dr. Alvy Bimler

## 2019-02-18 ENCOUNTER — Other Ambulatory Visit: Payer: Self-pay

## 2019-02-18 DIAGNOSIS — R590 Localized enlarged lymph nodes: Secondary | ICD-10-CM

## 2019-02-18 DIAGNOSIS — Q782 Osteopetrosis: Secondary | ICD-10-CM

## 2019-02-18 DIAGNOSIS — R935 Abnormal findings on diagnostic imaging of other abdominal regions, including retroperitoneum: Secondary | ICD-10-CM

## 2019-02-18 NOTE — Telephone Encounter (Signed)
Referral ordered in Epic. 

## 2019-02-19 ENCOUNTER — Ambulatory Visit (INDEPENDENT_AMBULATORY_CARE_PROVIDER_SITE_OTHER): Payer: BC Managed Care – PPO | Admitting: Family Medicine

## 2019-02-19 ENCOUNTER — Encounter (INDEPENDENT_AMBULATORY_CARE_PROVIDER_SITE_OTHER): Payer: Self-pay | Admitting: Family Medicine

## 2019-02-19 ENCOUNTER — Encounter (INDEPENDENT_AMBULATORY_CARE_PROVIDER_SITE_OTHER): Payer: Self-pay | Admitting: Bariatrics

## 2019-02-19 VITALS — BP 122/80 | HR 62 | Temp 97.6°F | Ht 63.0 in | Wt 253.0 lb

## 2019-02-19 DIAGNOSIS — E8881 Metabolic syndrome: Secondary | ICD-10-CM | POA: Diagnosis not present

## 2019-02-19 DIAGNOSIS — Z6841 Body Mass Index (BMI) 40.0 and over, adult: Secondary | ICD-10-CM

## 2019-02-19 NOTE — Progress Notes (Signed)
Office: [phone]  /  Fax: [phone]   HPI:   Chief Complaint: OBESITY Margaret Porter is here to discuss her progress with her obesity treatment plan. She is on the Category 3 plan and is following her eating plan approximately 85 % of the time. She states she is exercising 0 minutes 0 times per week. Margaret Porter is not meal prepping for lunch. She doesn't like microwave meals. She sometimes eats out at lunch and dinner but tries to make healthy choices Her weight is 253 lb (114.8 kg) today and has had a weight loss of 3 pounds over a period of 2 weeks since her last visit. She has lost 9 lbs since starting treatment with [name].  Insulin Resistance Margaret Porter has a diagnosis of insulin resistance based on her elevated fasting insulin level >5. Although Margaret Porter blood glucose readings are still under good control, insulin resistance puts her at greater risk of metabolic syndrome and diabetes. She has not tried metformin which was prescribed at her last visit. She continues to work on diet and exercise to decrease risk of diabetes. She denies polyphagia. Lab Results  Component Value Date   HGBA1C 5.3 12/02/2018    ASSESSMENT AND PLAN:  Insulin resistance  Class 3 severe obesity with serious comorbidity and body mass index (BMI) of 40.0 to 44.9 in adult, unspecified obesity type (Margaret Porter)  PLAN:  Insulin Resistance Margaret Porter will continue to work on weight loss, exercise, and decreasing simple carbohydrates in her diet to help decrease the risk of diabetes. We discussed metformin including benefits and risks. She was informed that eating too many simple carbohydrates or too many calories at one sitting increases the likelihood of GI side effects. The pros and cons of metformin were discussed with Margaret Porter. She will try the metformin. A prescription was not written today. Margaret Porter agrees to follow up with our clinic in 2-3 weeks  I spent > than 50% of the 15 minute visit on counseling as documented  in the note.  Obesity Margaret Porter is currently in the action stage of change. As such, her goal is to continue with weight loss efforts She has agreed to keep a food journal with 450-600 calories and 40 + grams of protein daily at supper and follow the Category 3 plan Margaret Porter has not been prescribed exercise at this time.  We discussed the following Behavioral Modification Strategies today: increasing lean protein intake, no skipping meals, planning for success and keep a strict food journal  Margaret Porter has agreed to follow up with our clinic in 2-3 weeks. She was informed of the importance of frequent follow up visits to maximize her success with intensive lifestyle modifications for her multiple health conditions.  ALLERGIES: Allergies  Allergen Reactions  . Molds & Smuts   . Oxycodone     Questionable mouth itching, sweats, but was also simultaneously on Cipro, Flagyl, and Robaxin.  Margaret Porter Kitchen Red Dye   . Amoxicillin Rash  . Aspirin Anxiety  . Sulfamethoxazole-Trimethoprim Rash    MEDICATIONS: Current Outpatient Medications on File Prior to Visit  Medication Sig Dispense Refill  . diphenhydrAMINE (BENADRYL) 25 MG tablet Take 25 mg by mouth every 6 (six) hours as needed for itching or allergies. Reported on 02/29/2016    . GENVOYA 150-150-200-10 MG TABS tablet TAKE ONE TABLET BY MOUTH ONCE DAILY WITH BREAKFAST. STORE IN ORIGINAL CONTAINER AT ROOM TEMPERATURE. 30 tablet 0  . metFORMIN (GLUCOPHAGE) 500 MG tablet Take 1 tablet (500 mg total) by mouth daily with breakfast. 30 tablet 0  .  Vitamin D, Ergocalciferol, (DRISDOL) 1.25 MG (50000 UT) CAPS capsule Take 1 capsule (50,000 Units total) by mouth every 7 (seven) days. 4 capsule 0   No current facility-administered medications on file prior to visit.     PAST MEDICAL HISTORY: Past Medical History:  Diagnosis Date  . Allergic rhinitis   . Anemia   . Food allergy   . HIV infection ([place]) 2006  . Idiopathic urticaria   . Joint pain   .  Obesity   . Pre-diabetes   . Rosacea   . Swelling of both lower extremities   . Vitamin D deficiency     PAST SURGICAL HISTORY: Past Surgical History:  Procedure Laterality Date  . APPENDECTOMY    . DILATATION & CURRETTAGE/HYSTEROSCOPY WITH RESECTOCOPE N/A 06/27/2015   Procedure: DILATATION & CURETTAGE/HYSTEROSCOPY WITH RESECTOCOPE;  Surgeon: Servando Salina, MD;  Location: [facility];  Service: Gynecology;  Laterality: N/A;  . LAPAROSCOPIC APPENDECTOMY N/A 09/07/2017   Procedure: APPENDECTOMY LAPAROSCOPIC;  Surgeon: Clovis Riley, MD;  Location: [facility];  Service: General;  Laterality: N/A;    SOCIAL HISTORY: Social History   Tobacco Use  . Smoking status: Never Smoker  . Smokeless tobacco: Never Used  Substance Use Topics  . Alcohol use: Yes    Alcohol/week: 0.0 standard drinks    Comment: occasional (special occasions)  . Drug use: No    FAMILY HISTORY: Family History  Problem Relation Age of Onset  . Diabetes Mother   . Diabetes Brother   . Heart disease Paternal Uncle   . Heart disease Maternal Grandmother   . Cancer Maternal Grandmother   . Heart disease Paternal Grandmother   . Diabetes Father   . High blood pressure Father     ROS: Review of Systems  Constitutional: Positive for weight loss.  Genitourinary:       Negative for polyuria  Endo/Heme/Allergies:       Negative for polyphagia    PHYSICAL EXAM: Blood pressure 122/80, pulse 62, temperature 97.6 F (36.4 C), height 5\' 3"  (1.6 m), weight 253 lb (114.8 kg), last menstrual period 01/31/2019, SpO2 99 %. Body mass index is 44.82 kg/m. Physical Exam  RECENT LABS AND TESTS: BMET    Component Value Date/Time   NA 139 02/16/2019 0850   K 4.4 02/16/2019 0850   CL 104 02/16/2019 0850   CO2 26 02/16/2019 0850   GLUCOSE 97 02/16/2019 0850   BUN 12 02/16/2019 0850   CREATININE 0.99 02/16/2019 0850   CALCIUM 9.6 02/16/2019 0850   GFRNONAA 68 12/02/2018 0810   GFRAA 79 12/02/2018 0810   Lab  Results  Component Value Date   HGBA1C 5.3 12/02/2018   HGBA1C 5.4 11/12/2018   HGBA1C 5.3 02/14/2018   HGBA1C 5.1 04/04/2017   HGBA1C 5.7 (H) 02/13/2016   Lab Results  Component Value Date   INSULIN 21.1 11/12/2018   CBC    Component Value Date/Time   WBC 4.9 12/02/2018 0810   RBC 4.26 12/02/2018 0810   HGB 13.3 12/02/2018 0810   HGB 14.1 11/12/2018 0938   HCT 39.4 12/02/2018 0810   HCT 42.9 11/12/2018 0938   PLT 301 12/02/2018 0810   PLT 257 11/12/2018 0938   MCV 92.5 12/02/2018 0810   MCV 94 11/12/2018 0938   MCH 31.2 12/02/2018 0810   MCHC 33.8 12/02/2018 0810   RDW 13.7 12/02/2018 0810   RDW 13.2 11/12/2018 0938   LYMPHSABS 1,495 12/02/2018 0810   LYMPHSABS 0.9 11/12/2018 0938   MONOABS 0.5  02/13/2016 0923   EOSABS 29 12/02/2018 0810   EOSABS 0.0 11/12/2018 0938   BASOSABS 20 12/02/2018 0810   BASOSABS 0.0 11/12/2018 0938   Iron/TIBC/Ferritin/ %Sat No results found for: IRON, TIBC, FERRITIN, IRONPCTSAT Lipid Panel     Component Value Date/Time   CHOL 149 12/02/2018 0810   TRIG 95 12/02/2018 0810   HDL 34 (L) 12/02/2018 0810   CHOLHDL 4.4 12/02/2018 0810   VLDL 24 04/04/2017 0935   LDLCALC 96 12/02/2018 0810   Hepatic Function Panel     Component Value Date/Time   PROT 6.6 02/16/2019 0850   PROT 6.0 02/03/2019 0920   ALBUMIN 3.4 (L) 09/07/2017 1638   AST 17 02/16/2019 0850   ALT 17 02/16/2019 0850   ALKPHOS 102 09/07/2017 1638   BILITOT 0.3 02/16/2019 0850   BILIDIR 0.1 12/22/2015 0001   IBILI 0.3 12/22/2015 0001      Component Value Date/Time   TSH 2.170 11/12/2018 0938   TSH 1.985 11/29/2014 1029      OBESITY BEHAVIORAL INTERVENTION VISIT  Today's visit was # 8   Starting weight: 262 lbs Starting date: 11/12/2018 Today's weight :: 253 lbs Today's date: 02/19/2019 Total lbs lost to date: 9    02/19/2019  BP 122/80  Temp 97.6 F (36.4 C)  Pulse 62  SpO2 99 %  Weight 253 lb  Height 5\' 3"  (1.6 m)  BMI (Calculated) 44.83     ASK: We discussed the diagnosis of obesity with Margaret Porter today and Margaret Porter agreed to give [name] permission to discuss obesity behavioral modification therapy today.  ASSESS: Margaret Porter has the diagnosis of obesity and her BMI today is 44.83 Margaret Porter is in the action stage of change   ADVISE: Margaret Porter was educated on the multiple health risks of obesity as well as the benefit of weight loss to improve her health. She was advised of the need for long term treatment and the importance of lifestyle modifications to improve her current health and to decrease her risk of future health problems.  AGREE: Multiple dietary modification options and treatment options were discussed and  Margaret Porter agreed to follow the recommendations documented in the above note.  ARRANGE: Margaret Porter was educated on the importance of frequent visits to treat obesity as outlined per CMS and USPSTF guidelines and agreed to schedule her next follow up appointment today.  I, Tammy Wysor, am acting as Location manager for [employer], FNP-C.  I have reviewed the above documentation for accuracy and completeness, and I agree with the above.  - Remmy Crass, FNP-C.

## 2019-02-23 ENCOUNTER — Encounter: Payer: Self-pay | Admitting: Gastroenterology

## 2019-02-23 ENCOUNTER — Ambulatory Visit (AMBULATORY_SURGERY_CENTER): Payer: Self-pay

## 2019-02-23 VITALS — Ht 63.0 in | Wt 250.0 lb

## 2019-02-23 DIAGNOSIS — Z1211 Encounter for screening for malignant neoplasm of colon: Secondary | ICD-10-CM

## 2019-02-23 MED ORDER — NA SULFATE-K SULFATE-MG SULF 17.5-3.13-1.6 GM/177ML PO SOLN: 1.0000 | Freq: Once | ORAL | 0 refills | Status: AC

## 2019-02-23 NOTE — Progress Notes (Signed)
Per pt, no allergies to soy or egg products.Pt not taking any weight loss meds or using  O2 at home.  Pt refused emmi video. 

## 2019-02-24 ENCOUNTER — Other Ambulatory Visit (INDEPENDENT_AMBULATORY_CARE_PROVIDER_SITE_OTHER): Payer: Self-pay | Admitting: Bariatrics

## 2019-02-24 ENCOUNTER — Telehealth: Payer: Self-pay

## 2019-02-24 DIAGNOSIS — E559 Vitamin D deficiency, unspecified: Secondary | ICD-10-CM

## 2019-02-24 NOTE — Telephone Encounter (Signed)
Patient has appointment for March 05, 2019 at 33 with [facility].   They did state that they are trying to get her in sooner.

## 2019-02-25 ENCOUNTER — Encounter: Payer: Self-pay | Admitting: Hematology

## 2019-02-25 ENCOUNTER — Telehealth: Payer: Self-pay | Admitting: Hematology

## 2019-02-25 NOTE — Telephone Encounter (Signed)
A new patient appt has been scheduled for the pt to see Dr. Irene Limbo on 3/5 at 11am. Pt has been cld and made aware to arrive 30 minutes early. Letter mailed.

## 2019-03-05 ENCOUNTER — Other Ambulatory Visit: Payer: Self-pay | Admitting: Family Medicine

## 2019-03-05 ENCOUNTER — Inpatient Hospital Stay: Payer: BC Managed Care – PPO | Attending: Hematology | Admitting: Hematology

## 2019-03-05 ENCOUNTER — Telehealth: Payer: Self-pay | Admitting: Hematology

## 2019-03-05 ENCOUNTER — Ambulatory Visit: Payer: Self-pay

## 2019-03-05 ENCOUNTER — Inpatient Hospital Stay: Payer: BC Managed Care – PPO

## 2019-03-05 VITALS — BP 143/85 | HR 67 | Temp 98.2°F | Resp 18 | Ht 63.0 in | Wt 255.4 lb

## 2019-03-05 DIAGNOSIS — N6489 Other specified disorders of breast: Secondary | ICD-10-CM | POA: Insufficient documentation

## 2019-03-05 DIAGNOSIS — M899 Disorder of bone, unspecified: Secondary | ICD-10-CM | POA: Diagnosis present

## 2019-03-05 DIAGNOSIS — D241 Benign neoplasm of right breast: Secondary | ICD-10-CM

## 2019-03-05 DIAGNOSIS — N200 Calculus of kidney: Secondary | ICD-10-CM | POA: Diagnosis not present

## 2019-03-05 DIAGNOSIS — C7951 Secondary malignant neoplasm of bone: Secondary | ICD-10-CM

## 2019-03-05 DIAGNOSIS — M25572 Pain in left ankle and joints of left foot: Secondary | ICD-10-CM

## 2019-03-05 DIAGNOSIS — C801 Malignant (primary) neoplasm, unspecified: Secondary | ICD-10-CM

## 2019-03-05 DIAGNOSIS — Z21 Asymptomatic human immunodeficiency virus [HIV] infection status: Secondary | ICD-10-CM | POA: Diagnosis not present

## 2019-03-05 DIAGNOSIS — E559 Vitamin D deficiency, unspecified: Secondary | ICD-10-CM

## 2019-03-05 LAB — CBC WITH DIFFERENTIAL/PLATELET
Abs Immature Granulocytes: 0.01 10*3/uL (ref 0.00–0.07)
Basophils Absolute: 0 10*3/uL (ref 0.0–0.1)
Basophils Relative: 0 %
Eosinophils Absolute: 0 10*3/uL (ref 0.0–0.5)
Eosinophils Relative: 0 %
HCT: 39.9 % (ref 36.0–46.0)
Hemoglobin: 13.1 g/dL (ref 12.0–15.0)
Immature Granulocytes: 0 %
Lymphocytes Relative: 34 %
Lymphs Abs: 1.7 10*3/uL (ref 0.7–4.0)
MCH: 31.3 pg (ref 26.0–34.0)
MCHC: 32.8 g/dL (ref 30.0–36.0)
MCV: 95.5 fL (ref 80.0–100.0)
Monocytes Absolute: 0.4 10*3/uL (ref 0.1–1.0)
Monocytes Relative: 8 %
NRBC: 0 % (ref 0.0–0.2)
Neutro Abs: 2.8 10*3/uL (ref 1.7–7.7)
Neutrophils Relative %: 58 %
Platelets: 232 10*3/uL (ref 150–400)
RBC: 4.18 MIL/uL (ref 3.87–5.11)
RDW: 13.2 % (ref 11.5–15.5)
WBC: 5 10*3/uL (ref 4.0–10.5)

## 2019-03-05 LAB — CMP (CANCER CENTER ONLY)
ALT: 18 U/L (ref 0–44)
AST: 15 U/L (ref 15–41)
Albumin: 3.9 g/dL (ref 3.5–5.0)
Alkaline Phosphatase: 128 U/L — ABNORMAL HIGH (ref 38–126)
Anion gap: 7 (ref 5–15)
BUN: 9 mg/dL (ref 6–20)
CHLORIDE: 107 mmol/L (ref 98–111)
CO2: 25 mmol/L (ref 22–32)
CREATININE: 0.86 mg/dL (ref 0.44–1.00)
Calcium: 8.6 mg/dL — ABNORMAL LOW (ref 8.9–10.3)
GFR, Est AFR Am: >60 mL/min (ref >60)
GFR, Estimated: >60 mL/min (ref >60)
Glucose, Bld: 90 mg/dL (ref 70–99)
Potassium: 3.8 mmol/L (ref 3.5–5.1)
Sodium: 139 mmol/L (ref 135–145)
Total Bilirubin: 0.4 mg/dL (ref 0.3–1.2)
Total Protein: 6.5 g/dL (ref 6.5–8.1)

## 2019-03-05 LAB — LACTATE DEHYDROGENASE: LDH: 193 U/L — ABNORMAL HIGH (ref 98–192)

## 2019-03-05 NOTE — Progress Notes (Signed)
HEMATOLOGY/ONCOLOGY CONSULTATION NOTE  Date of Service: 03/05/2019  Patient Care Team: Tysinger, Camelia Eng, PA-C as PCP - General (Family Medicine) Margaret Bickers, MD as PCP - Infectious Diseases (Infectious Diseases) Margaret Salina, MD as Consulting Physician (Obstetrics and Gynecology) Margaret Lopes, DO as Consulting Physician (Bariatrics)  CHIEF COMPLAINTS/PURPOSE OF CONSULTATION:  Sclerotic Bone Lesions  HISTORY OF PRESENTING ILLNESS:   Margaret Porter is a wonderful 49 y.o. female who has been referred to [name] by Margaret Bode, PA-C for evaluation and management of Sclerotic Bone Lesions. The pt reports that she is doing well overall.   The pt reports that she had appendicitis in September 2018, and had a CT A/P which revealed multiple foci of sclerosis within the spine and pelvic bones. She notes that this was not followed up on until a recent physical.   The pt notes that she has occasional knee and hip pain, and denies any other bone pains or new back pains. She has some occasional mild chest pain which presents when she bends over. The pt is now taking 50k units of Vitamin D once a week.   She denies changes in breathing, new headaches, changes in bowel habits, changes in urinary habits, fevers, chills, night sweats, or unexpected weight loss.   The pt takes Genvoya, diagnosed with HIV in 2003, last viral load was undetected, last CD4 was in the 700s and follows with Margaret Porter in [place]. The pt has not needed to take preventative antibiotics, and has not had problems with opportunistic infections.  The pt notes that her mother has sickle cell trait, and is unsure if she herself has the trait as well.  The pt notes that she is intending to start a diet and exercise routine to lose weight.   She has not had any changes in bowel habits. She notes that her last PAP smear revealed a yeast infection, but other pap smears have been normal. She has had cervical polyps removed  in the past. She has not had anal pap smears. She is up to speed with her mammograms. She is 49 years old and will be having her first colonoscopy later this month.  The pt is a middle school PE teacher.  Of note prior to the patient's visit today, pt has had a CT Chest completed on 02/11/19 with results revealing No lung masses or nodules. No acute findings. 2. 15 mm left breast mass. Although this is likely a benign lesion such as a fibroadenoma, in light of the skeletal lesions, diagnostic mammography with possible left breast ultrasound is recommended. 3. Multiple sclerotic bone lesions consistent with metastatic disease.  Most recent lab results (12/02/18) of CBC w/diff is as follows: all values are WNL. 02/03/19 SPEP revealed all values WNL, including the absence of an M spike.  On review of systems, pt reports good energy levels, eating well, stable weight, moving her bowels well, and denies progressive symptoms, fevers, chills, night sweats, unexpected weight loss, changes in bowel habits, abdominal pains, specific back pain, pelvic pain, new headaches, frequent infections, changes in breathing, and any other symptoms.   On PMHx the pt reports HIV, rosacea, Vitamin D deficiency. 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 Allergic rhinitis   .  Allergy   . Anemia   . Food allergy   . HIV infection ([place]) 2006  . Idiopathic urticaria   . Joint pain   . Lytic bone lesions on xray    bone lesions on hip,sternum and spine  . Obesity   . Pre-diabetes    pt not on meds  . Rosacea   . Sickle cell anemia ([facility])    Sickle cell trait with mom  . Swelling of both lower extremities    per pt, left side is more swollen  . Vitamin D deficiency     SURGICAL  HISTORY: Past Surgical History:  Procedure Laterality Date  . APPENDECTOMY    . DILATATION & CURRETTAGE/HYSTEROSCOPY WITH RESECTOCOPE N/A 06/27/2015   Procedure: DILATATION & CURETTAGE/HYSTEROSCOPY WITH RESECTOCOPE;  Surgeon: Margaret Salina, MD;  Location: [facility];  Service: Gynecology;  Laterality: N/A;  . LAPAROSCOPIC APPENDECTOMY N/A 09/07/2017   Procedure: APPENDECTOMY LAPAROSCOPIC;  Surgeon: Clovis Riley, MD;  Location: [place] [facility];  Service: General;  Laterality: N/A;    SOCIAL HISTORY: Social History   Socioeconomic History  . Marital status: Single    Spouse name: Not on file  . Number of children: Not on file  . Years of education: Not on file  . Highest education level: Not on file  Occupational History  . Occupation: Art therapist  Social Needs  . Financial resource strain: Not on file  . Food insecurity:    Worry: Not on file    Inability: Not on file  . Transportation needs:    Medical: Not on file    Non-medical: Not on file  Tobacco Use  . Smoking status: Never Smoker  . Smokeless tobacco: Never Used  Substance and Sexual Activity  . Alcohol use: Yes    Alcohol/week: 0.0 standard drinks    Comment: occasional (special occasions)  . Drug use: No  . Sexual activity: Never    Partners: Male    Birth control/protection: Condom    Comment: given condoms  Lifestyle  . Physical activity:    Days per week: Not on file    Minutes per session: Not on file  . Stress: Not on file  Relationships  . Social connections:    Talks on phone: Not on file    Gets together: Not on file    Attends religious service: Not on file    Active member of club or organization: Not on file    Attends meetings of clubs or organizations: Not on file    Relationship status: Not on file  . Intimate partner violence:    Fear of current or ex partner: Not on file    Emotionally abused: Not on file    Physically abused: Not on file    Forced sexual activity: Not on file  Other  Topics Concern  . 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   Single, no children    FAMILY HISTORY: Family History  Problem Relation Age of Onset  . Diabetes Mother   . Diabetes Brother   . Heart disease Paternal Uncle   . Heart disease Maternal Grandmother   . Cancer Maternal Grandmother   . Heart disease Paternal Grandmother   . Diabetes Father   . High blood pressure Father     ALLERGIES:  is allergic to molds & smuts; oxycodone; red dye; amoxicillin; aspirin; and sulfamethoxazole-trimethoprim.  MEDICATIONS:  Current Outpatient Medications  Medication Sig Dispense  Refill  . diphenhydrAMINE (BENADRYL) 25 MG tablet Take 25 mg by mouth every 6 (six) hours as needed for itching or allergies. Reported on 02/29/2016    . GENVOYA 150-150-200-10 MG TABS tablet TAKE ONE TABLET BY MOUTH ONCE DAILY WITH BREAKFAST. STORE IN ORIGINAL CONTAINER AT ROOM TEMPERATURE. 30 tablet 0  . metFORMIN (GLUCOPHAGE) 500 MG tablet Take 1 tablet (500 mg total) by mouth daily with breakfast. (Patient not taking: Reported on 02/23/2019) 30 tablet 0  . Vitamin D, Ergocalciferol, (DRISDOL) 1.25 MG (50000 UT) CAPS capsule Take 1 capsule (50,000 Units total) by mouth every 7 (seven) days. 4 capsule 0   No current facility-administered medications for this visit.     REVIEW OF SYSTEMS:    10 Point review of Systems was done is negative except as noted above.  PHYSICAL EXAMINATION: ECOG PERFORMANCE STATUS: 0 - Asymptomatic  . Vitals:   03/05/19 1123  BP: (!) 143/85  Pulse: 67  Resp: 18  Temp: 98.2 F (36.8 C)  SpO2: 100%   Filed Weights   03/05/19 1123  Weight: 255 lb 6.4 oz (115.8 kg)   .Body mass index is 45.24 kg/m.  GENERAL:alert, in no acute distress and comfortable SKIN: no acute rashes, no significant lesions EYES: conjunctiva are pink and non-injected, sclera anicteric OROPHARYNX: MMM, no exudates, no oropharyngeal erythema or  ulceration NECK: supple, no JVD LYMPH:  no palpable lymphadenopathy in the cervical, axillary or inguinal regions LUNGS: clear to auscultation b/l with normal respiratory effort HEART: regular rate & rhythm ABDOMEN:  normoactive bowel sounds , non tender, not distended. Extremity: no pedal edema PSYCH: alert & oriented x 3 with fluent speech NEURO: no focal motor/sensory deficits  LABORATORY DATA:  I have reviewed the data as listed  . CBC Latest Ref Rng & Units 12/02/2018 11/12/2018 02/14/2018  WBC 3.8 - 10.8 Thousand/uL 4.9 5.0 4.3  Hemoglobin 11.7 - 15.5 g/dL 13.3 14.1 12.3  Hematocrit 35.0 - 45.0 % 39.4 42.9 36.9  Platelets 140 - 400 Thousand/uL 301 257 239    . CMP Latest Ref Rng & Units 02/16/2019 02/03/2019 12/02/2018  Glucose 65 - 99 mg/dL 97 - 105(H)  BUN 7 - 25 mg/dL 12 - 14  Creatinine 0.50 - 1.05 mg/dL 0.99 - 0.97  Sodium 135 - 146 mmol/L 139 - 140  Potassium 3.5 - 5.3 mmol/L 4.4 - 4.4  Chloride 98 - 110 mmol/L 104 - 108  CO2 20 - 32 mmol/L 26 - 24  Calcium 8.6 - 10.4 mg/dL 9.6 - 9.1  Total Protein 6.1 - 8.1 g/dL 6.6 6.0 6.0(L)  Total Bilirubin 0.2 - 1.2 mg/dL 0.3 - 0.4  Alkaline Phos 38 - 126 U/L - - -  AST 10 - 35 U/L 17 - 17  ALT 6 - 29 U/L 17 - 17     RADIOGRAPHIC STUDIES: I have personally reviewed the radiological images as listed and agreed with the findings in the report. Dg Ankle Complete Left  Result Date: 02/13/2019 CLINICAL DATA:  Fall, posterior and lateral ankle pain EXAM: LEFT ANKLE COMPLETE - 3+ VIEW COMPARISON:  None. FINDINGS: There is no evidence of fracture, dislocation, or joint effusion. There is no evidence of arthropathy or other focal bone abnormality. Soft tissues are unremarkable. IMPRESSION: No fracture or dislocation of the left ankle. Joint spaces are well preserved. Electronically Signed   By: Eddie Candle M.D.   On: 02/13/2019 11:23   Ct Chest W Contrast  Result Date: 02/12/2019 CLINICAL DATA:  History reports  abnormal CT scan of  the abdomen pelvis, 01/29/2019 with liver lesions. No liver lesions were noted on that exam, but there are numerous sclerotic bone lesions. Questionable metastatic disease. No current history of carcinoma. EXAM: CT CHEST WITH CONTRAST TECHNIQUE: Multidetector CT imaging of the chest was performed during intravenous contrast administration. CONTRAST:  35mL ISOVUE-300 IOPAMIDOL (ISOVUE-300) INJECTION 61% COMPARISON:  Abdomen and pelvis CT, 01/29/2019. Chest radiograph, 02/01/2017. FINDINGS: Cardiovascular: Heart is normal in size and configuration. No pericardial effusion. No coronary artery calcifications. The great vessels normal caliber. No aortic atherosclerosis. Branch vessels are widely patent. Mediastinum/Nodes: No enlarged mediastinal, hilar, or axillary lymph nodes. Thyroid gland, trachea, and esophagus demonstrate no significant findings. Lungs/Pleura: Minimal left upper lobe lingula and right middle lobe scarring. Lungs otherwise clear. No pleural effusion or pneumothorax. No lung mass or nodule. Upper Abdomen: Unremarkable. Musculoskeletal: Numerous sclerotic bone lesions are noted most evident along the spine and sternum. No osteolytic lesions. Small left breast mass measuring 15 mm. Although this has benign morphology, there are no prior mammograms on the imaging timeline. Recommend follow-up diagnostic mammography and possible ultrasound for further assessment. IMPRESSION: 1. No lung masses or nodules.  No acute findings. 2. 15 mm left breast mass. Although this is likely a benign lesion such as a fibroadenoma, in light of the skeletal lesions, diagnostic mammography with possible left breast ultrasound is recommended. 3. Multiple sclerotic bone lesions consistent with metastatic disease. Electronically Signed   By: Lajean Manes M.D.   On: 02/12/2019 09:14    ASSESSMENT & PLAN:  49 y.o. female with  1. Sclerotic Bone Lesions -Discussed patient's most recent labs, 02/03/19 SPEP was normal without  an M spike detected. 12/02/18 CBC w/diff was normal. -Discussed the 02/11/19 CT Chest which revealed No lung masses or nodules.  No acute findings. 2. 15 mm left breast mass. Although this is likely a benign lesion such as a fibroadenoma, in light of the skeletal lesions, diagnostic mammography with possible left breast ultrasound is recommended. 3. Multiple sclerotic bone lesions consistent with metastatic disease. -Believe that the CT Chest could be misread, and should have noted a RIGHT breast mass, rather than left.  -Pt had a bilateral mammogram on 11/26/18 which did not observe suspicious findings in the left breast, but noted an indeterminate focal 2cm asymmetry in the right breast, which was subsequently [name] and then biopsied on 12/22/18 with needle and revealed a fibroadenoma -Discussed the 01/29/19 CT A/P which revealed Numerous sclerotic osseous lesions, the majority of which are unchanged though with mild progression of a few lesions. These remain indeterminate with both malignant (metastatic disease and lymphoma) and benign conditions (sclerosing skeletal dysplasias and sarcoidosis for example) on the differential. 2. No other evidence of malignancy or acute abnormality in the abdomen or pelvis. 3. Nonobstructing left renal calculus. -Reviewed the patient's 09/07/17 CT A/P in the setting of active appendicitis which revealed multiple foci of sclerosis within the spine and pelvic bones -Discussed that the stability of the patient's bony lesions are reassuring, and she does have a Vitamin D deficiency -Differential includes sarcoidosis vs slow growing malignancy like carcinoid or low-grade lymphoma vs less likely fungal or abnormal infection due to HIV status -Will order blood tests today -Will order PET/CT for more detailed characterization and biopsy site planning -Will see the pt back in 2 weeks   Labs today PET/CT in 1 week RTC with dr Irene Limbo in 2 weeks   All of the patients questions were  answered with apparent satisfaction.  The patient knows to call the clinic with any problems, questions or concerns.  The total time spent in the appt was 45 minutes and more than 50% was on counseling and direct patient cares.    Sullivan Lone MD MS AAHIVMS [facility] Hematology/Oncology Physician [facility]  (Office):       [phone] (Work cell):  [phone] (Fax):           [phone]  03/05/2019 12:13 PM  I, Baldwin [place], am acting as a scribe for Dr. Sullivan Lone.   .I have reviewed the above documentation for accuracy and completeness, and I agree with the above. Brunetta Genera MD

## 2019-03-05 NOTE — Telephone Encounter (Signed)
Scheduled per los, no printout needed

## 2019-03-06 LAB — VITAMIN D 25 HYDROXY (VIT D DEFICIENCY, FRACTURES): Vit D, 25-Hydroxy: 34.8 ng/mL (ref 30.0–100.0)

## 2019-03-06 LAB — ANGIOTENSIN CONVERTING ENZYME: Angiotensin-Converting Enzyme: 43 U/L (ref 14–82)

## 2019-03-09 ENCOUNTER — Ambulatory Visit (AMBULATORY_SURGERY_CENTER): Payer: BC Managed Care – PPO | Admitting: Gastroenterology

## 2019-03-09 ENCOUNTER — Encounter: Payer: Self-pay | Admitting: Gastroenterology

## 2019-03-09 ENCOUNTER — Other Ambulatory Visit: Payer: Self-pay

## 2019-03-09 VITALS — BP 118/92 | HR 58 | Temp 97.8°F | Resp 11 | Ht 63.0 in | Wt 255.0 lb

## 2019-03-09 DIAGNOSIS — Z1211 Encounter for screening for malignant neoplasm of colon: Secondary | ICD-10-CM

## 2019-03-09 HISTORY — PX: COLONOSCOPY: SHX174

## 2019-03-09 MED ORDER — SODIUM CHLORIDE 0.9 % IV SOLN: 500.0000 mL | Freq: Once | INTRAVENOUS | Status: DC

## 2019-03-09 NOTE — Progress Notes (Signed)
A and O x3. Report to RN. Tolerated MAC anesthesia well.

## 2019-03-09 NOTE — Op Note (Signed)
[place] Patient Name: Margaret Porter Procedure Date: 03/09/2019 10:38 AM MRN: [id number] Endoscopist: Mauri Pole , MD Age: 49 Referring MD:  Date of Birth: 1970-04-13 Gender: Female Account #: [id number] Procedure:                Colonoscopy Indications:              Screening for colorectal malignant neoplasm Medicines:                Monitored Anesthesia Care Procedure:                Pre-Anesthesia Assessment:                           - Prior to the procedure, a History and Physical                            was performed, and patient medications and                            allergies were reviewed. The patient's tolerance of                            previous anesthesia was also reviewed. The risks                            and benefits of the procedure and the sedation                            options and risks were discussed with the patient.                            All questions were answered, and informed consent                            was obtained. Prior Anticoagulants: The patient has                            taken no previous anticoagulant or antiplatelet                            agents. ASA Grade Assessment: II - A patient with                            mild systemic disease. After reviewing the risks                            and benefits, the patient was deemed in                            satisfactory condition to undergo the procedure.                           After obtaining informed consent, the colonoscope  was passed under direct vision. Throughout the                            procedure, the patient's blood pressure, pulse, and                            oxygen saturations were monitored continuously. The                            Colonoscope was introduced through the anus and                            advanced to the the terminal ileum, with                            identification of  the appendiceal orifice and IC                            valve. The colonoscopy was performed without                            difficulty. The patient tolerated the procedure                            well. The quality of the bowel preparation was                            excellent. The terminal ileum, ileocecal valve,                            appendiceal orifice, and rectum were photographed. Scope In: 10:50:42 AM Scope Out: 11:03:28 AM Scope Withdrawal Time: 0 hours 7 minutes 14 seconds  Total Procedure Duration: 0 hours 12 minutes 46 seconds  Findings:                 The perianal and digital rectal examinations were                            normal.                           A few small-mouthed diverticula were found in the                            sigmoid colon.                           Non-bleeding internal hemorrhoids were found during                            retroflexion. The hemorrhoids were small.                           The exam was otherwise without abnormality. Complications:            No immediate complications. Estimated Blood  Loss:     Estimated blood loss: none. Impression:               - Diverticulosis in the sigmoid colon.                           - Non-bleeding internal hemorrhoids.                           - The examination was otherwise normal.                           - No specimens collected. Recommendation:           - Patient has a contact number available for                            emergencies. The signs and symptoms of potential                            delayed complications were discussed with the                            patient. Return to normal activities tomorrow.                            Written discharge instructions were provided to the                            patient.                           - Resume previous diet.                           - Continue present medications.                           - Repeat  colonoscopy in 10 years for screening                            purposes. Mauri Pole, MD 03/09/2019 11:08:26 AM This report has been signed electronically.

## 2019-03-09 NOTE — Progress Notes (Signed)
Pt's states no medical or surgical changes since previsit or office visit. 

## 2019-03-09 NOTE — Patient Instructions (Signed)
YOU HAD AN ENDOSCOPIC PROCEDURE TODAY AT THE [facility]:   Refer to the procedure report that was given to you for any specific questions about what was found during the examination.  If the procedure report does not answer your questions, please call your gastroenterologist to clarify.  If you requested that your care partner not be given the details of your procedure findings, then the procedure report has been included in a sealed envelope for you to review at your convenience later.  YOU SHOULD EXPECT: Some feelings of bloating in the abdomen. Passage of more gas than usual.  Walking can help get rid of the air that was put into your GI tract during the procedure and reduce the bloating. If you had a lower endoscopy (such as a colonoscopy or flexible sigmoidoscopy) you may notice spotting of blood in your stool or on the toilet paper. If you underwent a bowel prep for your procedure, you may not have a normal bowel movement for a few days.  Please Note:  You might notice some irritation and congestion in your nose or some drainage.  This is from the oxygen used during your procedure.  There is no need for concern and it should clear up in a day or so.  SYMPTOMS TO REPORT IMMEDIATELY:   Following lower endoscopy (colonoscopy or flexible sigmoidoscopy):  Excessive amounts of blood in the stool  Significant tenderness or worsening of abdominal pains  Swelling of the abdomen that is new, acute  Fever of 100F or higher  For urgent or emergent issues, a gastroenterologist can be reached at any hour by calling [phone].   DIET:  We do recommend a small meal at first, but then you may proceed to your regular diet.  Drink plenty of fluids but you should avoid alcoholic beverages for 24 hours.  ACTIVITY:  You should plan to take it easy for the rest of today and you should NOT DRIVE or use heavy machinery until tomorrow (because of the sedation medicines used during the test).     FOLLOW UP: Our staff will call the number listed on your records the next business day following your procedure to check on you and address any questions or concerns that you may have regarding the information given to you following your procedure. If we do not reach you, we will leave a message.  However, if you are feeling well and you are not experiencing any problems, there is no need to return our call.  We will assume that you have returned to your regular daily activities without incident.  If any biopsies were taken you will be contacted by phone or by letter within the next 1-3 weeks.  Please call us at [phone] if you have not heard about the biopsies in 3 weeks.    SIGNATURES/CONFIDENTIALITY: You and/or your care partner have signed paperwork which will be entered into your electronic medical record.  These signatures attest to the fact that that the information above on your After Visit Summary has been reviewed and is understood.  Full responsibility of the confidentiality of this discharge information lies with you and/or your care-partner. 

## 2019-03-10 ENCOUNTER — Telehealth: Payer: Self-pay | Admitting: *Deleted

## 2019-03-10 NOTE — Telephone Encounter (Signed)
  Follow up Call-  Call back number 03/09/2019  Post procedure Call Back phone  # [phone]  Permission to leave phone message Yes  Some recent data might be hidden     Patient questions:  Do you have a fever, pain , or abdominal swelling? No. Pain Score  0 *  Have you tolerated food without any problems? Yes.    Have you been able to return to your normal activities? Yes.    Do you have any questions about your discharge instructions: Diet   No. Medications  No. Follow up visit  No.  Do you have questions or concerns about your Care? No.  Actions: * If pain score is 4 or above: No action needed, pain <4.

## 2019-03-11 ENCOUNTER — Ambulatory Visit (INDEPENDENT_AMBULATORY_CARE_PROVIDER_SITE_OTHER): Payer: BC Managed Care – PPO | Admitting: Family Medicine

## 2019-03-11 ENCOUNTER — Other Ambulatory Visit: Payer: Self-pay

## 2019-03-11 ENCOUNTER — Encounter (INDEPENDENT_AMBULATORY_CARE_PROVIDER_SITE_OTHER): Payer: Self-pay | Admitting: Family Medicine

## 2019-03-11 VITALS — BP 120/79 | HR 78 | Ht 63.0 in | Wt 249.0 lb

## 2019-03-11 DIAGNOSIS — Z6841 Body Mass Index (BMI) 40.0 and over, adult: Secondary | ICD-10-CM

## 2019-03-11 DIAGNOSIS — E559 Vitamin D deficiency, unspecified: Secondary | ICD-10-CM

## 2019-03-11 DIAGNOSIS — Z9189 Other specified personal risk factors, not elsewhere classified: Secondary | ICD-10-CM

## 2019-03-11 MED ORDER — VITAMIN D (ERGOCALCIFEROL) 1.25 MG (50000 UNIT) PO CAPS: 50000.0000 [IU] | ORAL_CAPSULE | ORAL | 0 refills | Status: DC

## 2019-03-12 NOTE — Progress Notes (Signed)
Office: [phone]  /  Fax: [phone]   HPI:   Chief Complaint: OBESITY Margaret Porter is here to discuss her progress with her obesity treatment plan. She is on the Category 3 plan and is following her eating plan approximately 85 % of the time. She states she is "just trying to get extra steps". Margaret Porter continues to do well with weight loss, but struggles with meal planning and prepping due to working long hours.  Her weight is 249 lb (112.9 kg) today and has had a weight loss of 4 pounds over a period of 3 weeks since her last visit. She has lost 13 lbs since starting treatment with [name].  Vitamin D Deficiency Margaret Porter has a diagnosis of vitamin D deficiency. She is currently stable on vit D, but is not yet at goal. Margaret Porter denies nausea, vomiting, or muscle weakness.  At risk for osteopenia and osteoporosis Margaret Porter is at higher risk of osteopenia and osteoporosis due to vitamin D deficiency.   ASSESSMENT AND PLAN:  Vitamin D deficiency - Plan: Vitamin D, Ergocalciferol, (DRISDOL) 1.25 MG (50000 UT) CAPS capsule  At risk for osteoporosis  Class 3 severe obesity with serious comorbidity and body mass index (BMI) of 40.0 to 44.9 in adult, unspecified obesity type ([place])  PLAN:  Vitamin D Deficiency Cadince was informed that low vitamin D levels contributes to fatigue and are associated with obesity, breast, and colon cancer. Margaret Porter agrees to continue to take prescription Vit D @50 ,000 IU every week #4 with no refills and will follow up for routine testing of vitamin D, at least 2-3 times per year. She was informed of the risk of over-replacement of vitamin D and agrees to not increase her dose unless she discusses this with [name] first. Margaret Porter agrees to follow up in 2 to 3 weeks as directed.  At risk for osteopenia and osteoporosis Margaret Porter was given extended (15 minutes) osteoporosis prevention counseling today. Margaret Porter is at risk for osteopenia and osteoporosis due to her  vitamin D deficiency. She was encouraged to take her vitamin D and follow her higher calcium diet and increase strengthening exercise to help strengthen her bones and decrease her risk of osteopenia and osteoporosis.  Obesity Margaret Porter is currently in the action stage of change. As such, her goal is to continue with weight loss efforts. She has agreed to follow the Category 3 plan and to keep a food journal of 450 to 600 calories and 40+ grams of protein for supper. Margaret Porter has been instructed to work up to a goal of 150 minutes of combined cardio and strengthening exercise per week for weight loss and overall health benefits. We discussed the following Behavioral Modification Strategies today: increasing lean protein intake, decreasing simple carbohydrates, work on meal planning and easy cooking plans, and emotional eating strategies.  Margaret Porter has agreed to follow up with our clinic in 2 to 3 weeks. She was informed of the importance of frequent follow up visits to maximize her success with intensive lifestyle modifications for her multiple health conditions.  ALLERGIES: Allergies  Allergen Reactions  . Molds & Smuts     SOB, chest congestion  . Oxycodone     Questionable mouth itching, sweats, but was also simultaneously on Cipro, Flagyl, and Robaxin.  Margaret Porter Kitchen Red Dye     Caused a rash with large amounts  . Amoxicillin Rash  . Aspirin Anxiety  . Sulfamethoxazole-Trimethoprim Rash    MEDICATIONS: Current Outpatient Medications on File Prior to Visit  Medication Sig Dispense Refill  .  diphenhydrAMINE (BENADRYL) 25 MG tablet Take 25 mg by mouth every 6 (six) hours as needed for itching or allergies. Reported on 02/29/2016    . GENVOYA 150-150-200-10 MG TABS tablet TAKE ONE TABLET BY MOUTH ONCE DAILY WITH BREAKFAST. STORE IN ORIGINAL CONTAINER AT ROOM TEMPERATURE. 30 tablet 0  . metFORMIN (GLUCOPHAGE) 500 MG tablet Take 1 tablet (500 mg total) by mouth daily with breakfast. (Patient not taking:  Reported on 03/11/2019) 30 tablet 0   No current facility-administered medications on file prior to visit.     PAST MEDICAL HISTORY: Past Medical History:  Diagnosis Date  . Allergic rhinitis   . Allergy   . Anemia   . Food allergy   . HIV infection ([place]) 2006  . Idiopathic urticaria   . Joint pain   . Lytic bone lesions on xray    bone lesions on hip,sternum and spine  . Obesity   . Pre-diabetes    pt not on meds  . Rosacea   . Sickle cell anemia (H[facility])    Sickle cell trait with mom  . Swelling of both lower extremities    per pt, left side is more swollen  . Vitamin D deficiency     PAST SURGICAL HISTORY: Past Surgical History:  Procedure Laterality Date  . APPENDECTOMY    . DILATATION & CURRETTAGE/HYSTEROSCOPY WITH RESECTOCOPE N/A 06/27/2015   Procedure: DILATATION & CURETTAGE/HYSTEROSCOPY WITH RESECTOCOPE;  Surgeon: Servando Salina, MD;  Location: [facility];  Service: Gynecology;  Laterality: N/A;  . LAPAROSCOPIC APPENDECTOMY N/A 09/07/2017   Procedure: APPENDECTOMY LAPAROSCOPIC;  Surgeon: Clovis Riley, MD;  Location: [facility];  Service: General;  Laterality: N/A;    SOCIAL HISTORY: Social History   Tobacco Use  . Smoking status: Never Smoker  . Smokeless tobacco: Never Used  Substance Use Topics  . Alcohol use: Yes    Alcohol/week: 0.0 standard drinks    Comment: occasional (special occasions)  . Drug use: No    FAMILY HISTORY: Family History  Problem Relation Age of Onset  . Diabetes Mother   . Diabetes Brother   . Heart disease Paternal Uncle   . Heart disease Maternal Grandmother   . Cancer Maternal Grandmother   . Heart disease Paternal Grandmother   . Diabetes Father   . High blood pressure Father   . Colon cancer Neg Hx   . Colon polyps Neg Hx   . Esophageal cancer Neg Hx   . Rectal cancer Neg Hx   . Stomach cancer Neg Hx    ROS: Review of Systems  Constitutional: Positive for weight loss.  Gastrointestinal: Negative for nausea and  vomiting.  Musculoskeletal:       Negative for muscle weakness.   PHYSICAL EXAM: Blood pressure 120/79, pulse 78, height 5\' 3"  (1.6 m), weight 249 lb (112.9 kg), last menstrual period 02/28/2019, SpO2 97 %. Body mass index is 44.11 kg/m. Physical Exam Vitals signs reviewed.  Constitutional:      Appearance: Normal appearance. She is obese.  Cardiovascular:     Rate and Rhythm: Normal rate.  Pulmonary:     Effort: Pulmonary effort is normal.  Musculoskeletal: Normal range of motion.  Skin:    General: Skin is warm and dry.  Neurological:     Mental Status: She is alert and oriented to person, place, and time.  Psychiatric:        Mood and Affect: Mood normal.        Behavior: Behavior normal.    RECENT  LABS AND TESTS: BMET    Component Value Date/Time   NA 139 03/05/2019 1235   K 3.8 03/05/2019 1235   CL 107 03/05/2019 1235   CO2 25 03/05/2019 1235   GLUCOSE 90 03/05/2019 1235   BUN 9 03/05/2019 1235   CREATININE 0.86 03/05/2019 1235   CREATININE 0.99 02/16/2019 0850   CALCIUM 8.6 (L) 03/05/2019 1235   GFRNONAA >60 03/05/2019 1235   GFRNONAA 68 12/02/2018 0810   GFRAA >60 03/05/2019 1235   GFRAA 79 12/02/2018 0810   Lab Results  Component Value Date   HGBA1C 5.3 12/02/2018   HGBA1C 5.4 11/12/2018   HGBA1C 5.3 02/14/2018   HGBA1C 5.1 04/04/2017   HGBA1C 5.7 (H) 02/13/2016   Lab Results  Component Value Date   INSULIN 21.1 11/12/2018   CBC    Component Value Date/Time   WBC 5.0 03/05/2019 1235   RBC 4.18 03/05/2019 1235   HGB 13.1 03/05/2019 1235   HGB 14.1 11/12/2018 0938   HCT 39.9 03/05/2019 1235   HCT 42.9 11/12/2018 0938   PLT 232 03/05/2019 1235   PLT 257 11/12/2018 0938   MCV 95.5 03/05/2019 1235   MCV 94 11/12/2018 0938   MCH 31.3 03/05/2019 1235   MCHC 32.8 03/05/2019 1235   RDW 13.2 03/05/2019 1235   RDW 13.2 11/12/2018 0938   LYMPHSABS 1.7 03/05/2019 1235   LYMPHSABS 0.9 11/12/2018 0938   MONOABS 0.4 03/05/2019 1235   EOSABS 0.0  03/05/2019 1235   EOSABS 0.0 11/12/2018 0938   BASOSABS 0.0 03/05/2019 1235   BASOSABS 0.0 11/12/2018 0938   Iron/TIBC/Ferritin/ %Sat No results found for: IRON, TIBC, FERRITIN, IRONPCTSAT Lipid Panel     Component Value Date/Time   CHOL 149 12/02/2018 0810   TRIG 95 12/02/2018 0810   HDL 34 (L) 12/02/2018 0810   CHOLHDL 4.4 12/02/2018 0810   VLDL 24 04/04/2017 0935   LDLCALC 96 12/02/2018 0810   Hepatic Function Panel     Component Value Date/Time   PROT 6.5 03/05/2019 1235   PROT 6.0 02/03/2019 0920   ALBUMIN 3.9 03/05/2019 1235   AST 15 03/05/2019 1235   ALT 18 03/05/2019 1235   ALKPHOS 128 (H) 03/05/2019 1235   BILITOT 0.4 03/05/2019 1235   BILIDIR 0.1 12/22/2015 0001   IBILI 0.3 12/22/2015 0001      Component Value Date/Time   TSH 2.170 11/12/2018 0938   TSH 1.985 11/29/2014 1029   Results for LORELL, THIBODAUX (MRN [id number]) as of 03/12/2019 06:11  Ref. Range 03/05/2019 12:35  Vitamin D, 25-Hydroxy Latest Ref Range: 30.0 - 100.0 ng/mL 34.8   OBESITY BEHAVIORAL INTERVENTION VISIT  Today's visit was # 9   Starting weight: 262 lbs Starting date: 11/12/18 Today's weight : Weight: 249 lb (112.9 kg)  Today's date: 03/11/2019 Total lbs lost to date: 13    03/11/2019  Height 5\' 3"  (1.6 m)  Weight 249 lb (112.9 kg)  BMI (Calculated) 44.12  BLOOD PRESSURE - SYSTOLIC 211  BLOOD PRESSURE - DIASTOLIC 79   Body Fat % 94.1 %  Total Body Water (lbs) 91.6 lbs   ASK: We discussed the diagnosis of obesity with Margaret Porter today and Margaret Porter agreed to give [name] permission to discuss obesity behavioral modification therapy today.  ASSESS: Margaret Porter has the diagnosis of obesity and her BMI today is 44.54. Karne is in the action stage of change.   ADVISE: Margaret Porter was educated on the multiple health risks of obesity as well as the benefit  of weight loss to improve her health. She was advised of the need for long term treatment and the importance of lifestyle  modifications to improve her current health and to decrease her risk of future health problems.  AGREE: Multiple dietary modification options and treatment options were discussed and Margaret Porter agreed to follow the recommendations documented in the above note.  ARRANGE: Margaret Porter was educated on the importance of frequent visits to treat obesity as outlined per CMS and USPSTF guidelines and agreed to schedule her next follow up appointment today.  IMarcille Blanco, CMA, am acting as transcriptionist for Starlyn Skeans, MD  I have reviewed the above documentation for accuracy and completeness, and I agree with the above. -Dennard Nip, MD

## 2019-03-17 ENCOUNTER — Telehealth: Payer: Self-pay | Admitting: Hematology

## 2019-03-17 ENCOUNTER — Telehealth: Payer: Self-pay | Admitting: *Deleted

## 2019-03-17 NOTE — Telephone Encounter (Signed)
Scheduled appt per 3/17 sch message - pt aware of appt date and time   

## 2019-03-17 NOTE — Telephone Encounter (Signed)
Patient asked to have appt for 3/19 rescheduled until after PET. PET rescheduled to 3/24 (awaiting authorization). Advised patient that schedule msg will be sent and they will contact with a new appt time. Patient verbalized understanding.

## 2019-03-18 ENCOUNTER — Ambulatory Visit (HOSPITAL_COMMUNITY): Payer: BC Managed Care – PPO

## 2019-03-18 ENCOUNTER — Encounter: Payer: Self-pay | Admitting: *Deleted

## 2019-03-18 LAB — HIV-1 RNA QUANT-NO REFLEX-BLD

## 2019-03-19 ENCOUNTER — Inpatient Hospital Stay: Payer: BC Managed Care – PPO | Admitting: Hematology

## 2019-03-24 ENCOUNTER — Ambulatory Visit (HOSPITAL_COMMUNITY): Payer: BC Managed Care – PPO

## 2019-03-25 ENCOUNTER — Telehealth: Payer: Self-pay | Admitting: *Deleted

## 2019-03-25 ENCOUNTER — Encounter (INDEPENDENT_AMBULATORY_CARE_PROVIDER_SITE_OTHER): Payer: Self-pay

## 2019-03-25 ENCOUNTER — Telehealth: Payer: Self-pay | Admitting: Hematology

## 2019-03-25 NOTE — Telephone Encounter (Signed)
Scheduled appt per 3/25 sch message- pt aware of new appt date and time

## 2019-03-25 NOTE — Telephone Encounter (Signed)
Patient asked to reschedule appt with Dr. Irene Limbo after PET on 4/2. Schedule message sent = patient verbalized understanding.

## 2019-03-28 ENCOUNTER — Other Ambulatory Visit (INDEPENDENT_AMBULATORY_CARE_PROVIDER_SITE_OTHER): Payer: Self-pay | Admitting: Bariatrics

## 2019-03-28 DIAGNOSIS — E8881 Metabolic syndrome: Secondary | ICD-10-CM

## 2019-03-30 ENCOUNTER — Encounter (INDEPENDENT_AMBULATORY_CARE_PROVIDER_SITE_OTHER): Payer: Self-pay | Admitting: Family Medicine

## 2019-03-30 ENCOUNTER — Other Ambulatory Visit: Payer: Self-pay

## 2019-03-30 ENCOUNTER — Ambulatory Visit (HOSPITAL_COMMUNITY): Payer: BC Managed Care – PPO

## 2019-03-30 ENCOUNTER — Ambulatory Visit (INDEPENDENT_AMBULATORY_CARE_PROVIDER_SITE_OTHER): Payer: BC Managed Care – PPO | Admitting: Family Medicine

## 2019-03-30 DIAGNOSIS — E8881 Metabolic syndrome: Secondary | ICD-10-CM | POA: Diagnosis not present

## 2019-03-30 DIAGNOSIS — E559 Vitamin D deficiency, unspecified: Secondary | ICD-10-CM | POA: Diagnosis not present

## 2019-03-30 DIAGNOSIS — Z6841 Body Mass Index (BMI) 40.0 and over, adult: Secondary | ICD-10-CM

## 2019-03-30 DIAGNOSIS — E66813 Obesity, class 3: Secondary | ICD-10-CM

## 2019-03-30 DIAGNOSIS — E88819 Insulin resistance, unspecified: Secondary | ICD-10-CM

## 2019-03-30 MED ORDER — VITAMIN D (ERGOCALCIFEROL) 1.25 MG (50000 UNIT) PO CAPS: 50000.0000 [IU] | ORAL_CAPSULE | ORAL | 0 refills | Status: DC

## 2019-03-31 ENCOUNTER — Ambulatory Visit: Payer: Self-pay | Admitting: Hematology

## 2019-03-31 NOTE — Progress Notes (Signed)
Office: [phone]  /  Fax: [phone] TeleHealth Visit:  Margaret Porter has consented to this TeleHealth visit today via skype. The patient is located at home, the provider is located at the [facility] office. The participants in this visit include the listed provider and patient.   HPI:   Chief Complaint: OBESITY Margaret Porter is here to discuss her progress with her obesity treatment plan. She is on the keep a food journal with 450-600 calories and 40+ grams of protein at supper daily and follow the Category 3 plan and is following her eating plan approximately 80 % of the time. She states she is exercising 0 minutes 0 times per week. Margaret Porter feels off track with the meal plan. Her scale does not work. She is finding most food is available in the store. She is following Category 3, but not eating all of the protein. She denies stress or boredom eating.  We were unable to weight the patient today for this TeleHealth visit. She feels as if she has maintained her weight since her last visit. She has lost 13 lbs since starting treatment with [name].  Vitamin D Deficiency Margaret Porter has a diagnosis of vitamin D deficiency. She is currently taking prescription Vit D, but level is not at goal. Last Vit D level was 34.8 on 03/05/2019. She denies nausea, vomiting or muscle weakness.  Insulin Resistance Margaret Porter has a diagnosis of insulin resistance based on her elevated fasting insulin level >5. Although Margaret Porter's blood glucose readings are still under good control, insulin resistance puts her at greater risk of metabolic syndrome and diabetes. She is on metformin and she feels it helps with appetite. She denies hypoglycemia and notes hunger in the evening. She currently and continues to work on diet and exercise to decrease risk of diabetes. Lab Results  Component Value Date   HGBA1C 5.3 12/02/2018    ASSESSMENT AND PLAN:  Vitamin D deficiency - Plan: Vitamin D, Ergocalciferol,  (DRISDOL) 1.25 MG (50000 UT) CAPS capsule  Insulin resistance  Class 3 severe obesity with serious comorbidity and body mass index (BMI) of 40.0 to 44.9 in adult, unspecified obesity type ([place])  PLAN:  Vitamin D Deficiency Margaret Porter was informed that low vitamin D levels contributes to fatigue and are associated with obesity, breast, and colon cancer. [name] agrees to continue taking prescription Vit D @50 ,000 IU every week #4 and we will refill for 1 month. She will follow up for routine testing of vitamin D, at least 2-3 times per year. She was informed of the risk of over-replacement of vitamin D and agrees to not increase her dose unless she discusses this with [name] first. Margaret Porter agrees to follow up with our clinic in 2 to 3 weeks.  Insulin Resistance Margaret Porter will continue to work on weight loss, exercise, and decreasing simple carbohydrates in her diet to help decrease the risk of diabetes.  Margaret Porter agrees to continue taking metformin, and she will continue her meal plan. Margaret Porter agrees to follow up with our clinic in 2 to 3 weeks as directed to monitor her progress.  Obesity Margaret Porter is currently in the action stage of change. As such, her goal is to continue with weight loss efforts She has agreed to keep a food journal with 450-600 calories and 40+ grams of protein  and follow the Category 3 plan Margaret Porter has been instructed to work up to a goal of 150 minutes of combined cardio and strengthening exercise per week for weight loss and  overall health benefits. We discussed the following Behavioral Modification Strategies today: increasing lean protein intake We discussed moving 2 oz of protein from dinner to after dinner for snack.   Margaret Porter has agreed to follow up with our clinic in 2 to 3 weeks. She was informed of the importance of frequent follow up visits to maximize her success with intensive lifestyle modifications for her multiple health conditions.  ALLERGIES: Allergies   Allergen Reactions  . Molds & Smuts     SOB, chest congestion  . Oxycodone     Questionable mouth itching, sweats, but was also simultaneously on Cipro, Flagyl, and Robaxin.  [name] Kitchen Red Dye     Caused a rash with large amounts  . Amoxicillin Rash  . Aspirin Anxiety  . Sulfamethoxazole-Trimethoprim Rash    MEDICATIONS: Current Outpatient Medications on File Prior to Visit  Medication Sig Dispense Refill  . diphenhydrAMINE (BENADRYL) 25 MG tablet Take 25 mg by mouth every 6 (six) hours as needed for itching or allergies. Reported on 02/29/2016    . GENVOYA 150-150-200-10 MG TABS tablet TAKE ONE TABLET BY MOUTH ONCE DAILY WITH BREAKFAST. STORE IN ORIGINAL CONTAINER AT ROOM TEMPERATURE. 30 tablet 0  . metFORMIN (GLUCOPHAGE) 500 MG tablet Take 1 tablet (500 mg total) by mouth daily with breakfast. (Patient not taking: Reported on 03/11/2019) 30 tablet 0   No current facility-administered medications on file prior to visit.     PAST MEDICAL HISTORY: Past Medical History:  Diagnosis Date  . Allergic rhinitis   . Allergy   . Anemia   . Food allergy   . HIV infection ([place]) 2006  . Idiopathic urticaria   . Joint pain   . Lytic bone lesions on xray    bone lesions on hip,sternum and spine  . Obesity   . Pre-diabetes    pt not on meds  . Rosacea   . Sickle cell anemia (H[facility])    Sickle cell trait with mom  . Swelling of both lower extremities    per pt, left side is more swollen  . Vitamin D deficiency     PAST SURGICAL HISTORY: Past Surgical History:  Procedure Laterality Date  . APPENDECTOMY    . DILATATION & CURRETTAGE/HYSTEROSCOPY WITH RESECTOCOPE N/A 06/27/2015   Procedure: DILATATION & CURETTAGE/HYSTEROSCOPY WITH RESECTOCOPE;  Surgeon: Servando Salina, MD;  Location: [facility];  Service: Gynecology;  Laterality: N/A;  . LAPAROSCOPIC APPENDECTOMY N/A 09/07/2017   Procedure: APPENDECTOMY LAPAROSCOPIC;  Surgeon: Clovis Riley, MD;  Location: [facility];  Service: General;  Laterality:  N/A;    SOCIAL HISTORY: Social History   Tobacco Use  . Smoking status: Never Smoker  . Smokeless tobacco: Never Used  Substance Use Topics  . Alcohol use: Yes    Alcohol/week: 0.0 standard drinks    Comment: occasional (special occasions)  . Drug use: No    FAMILY HISTORY: Family History  Problem Relation Age of Onset  . Diabetes Mother   . Diabetes Brother   . Heart disease Paternal Uncle   . Heart disease Maternal Grandmother   . Cancer Maternal Grandmother   . Heart disease Paternal Grandmother   . Diabetes Father   . High blood pressure Father   . Colon cancer Neg Hx   . Colon polyps Neg Hx   . Esophageal cancer Neg Hx   . Rectal cancer Neg Hx   . Stomach cancer Neg Hx     ROS: Review of Systems  Constitutional: Negative for weight loss.  Gastrointestinal: Negative for nausea and vomiting.  Musculoskeletal:       Negative muscle weakness  Endo/Heme/Allergies:       Negative hypoglycemia    PHYSICAL EXAM: Pt in no acute distress  RECENT LABS AND TESTS: BMET    Component Value Date/Time   NA 139 03/05/2019 1235   K 3.8 03/05/2019 1235   CL 107 03/05/2019 1235   CO2 25 03/05/2019 1235   GLUCOSE 90 03/05/2019 1235   BUN 9 03/05/2019 1235   CREATININE 0.86 03/05/2019 1235   CREATININE 0.99 02/16/2019 0850   CALCIUM 8.6 (L) 03/05/2019 1235   GFRNONAA >60 03/05/2019 1235   GFRNONAA 68 12/02/2018 0810   GFRAA >60 03/05/2019 1235   GFRAA 79 12/02/2018 0810   Lab Results  Component Value Date   HGBA1C 5.3 12/02/2018   HGBA1C 5.4 11/12/2018   HGBA1C 5.3 02/14/2018   HGBA1C 5.1 04/04/2017   HGBA1C 5.7 (H) 02/13/2016   Lab Results  Component Value Date   INSULIN 21.1 11/12/2018   CBC    Component Value Date/Time   WBC 5.0 03/05/2019 1235   RBC 4.18 03/05/2019 1235   HGB 13.1 03/05/2019 1235   HGB 14.1 11/12/2018 0938   HCT 39.9 03/05/2019 1235   HCT 42.9 11/12/2018 0938   PLT 232 03/05/2019 1235   PLT 257 11/12/2018 0938   MCV 95.5  03/05/2019 1235   MCV 94 11/12/2018 0938   MCH 31.3 03/05/2019 1235   MCHC 32.8 03/05/2019 1235   RDW 13.2 03/05/2019 1235   RDW 13.2 11/12/2018 0938   LYMPHSABS 1.7 03/05/2019 1235   LYMPHSABS 0.9 11/12/2018 0938   MONOABS 0.4 03/05/2019 1235   EOSABS 0.0 03/05/2019 1235   EOSABS 0.0 11/12/2018 0938   BASOSABS 0.0 03/05/2019 1235   BASOSABS 0.0 11/12/2018 0938   Iron/TIBC/Ferritin/ %Sat No results found for: IRON, TIBC, FERRITIN, IRONPCTSAT Lipid Panel     Component Value Date/Time   CHOL 149 12/02/2018 0810   TRIG 95 12/02/2018 0810   HDL 34 (L) 12/02/2018 0810   CHOLHDL 4.4 12/02/2018 0810   VLDL 24 04/04/2017 0935   LDLCALC 96 12/02/2018 0810   Hepatic Function Panel     Component Value Date/Time   PROT 6.5 03/05/2019 1235   PROT 6.0 02/03/2019 0920   ALBUMIN 3.9 03/05/2019 1235   AST 15 03/05/2019 1235   ALT 18 03/05/2019 1235   ALKPHOS 128 (H) 03/05/2019 1235   BILITOT 0.4 03/05/2019 1235   BILIDIR 0.1 12/22/2015 0001   IBILI 0.3 12/22/2015 0001      Component Value Date/Time   TSH 2.170 11/12/2018 0938   TSH 1.985 11/29/2014 1029      I, Trixie Dredge, am acting as Location manager for [employer], FNP-C.  I have reviewed the above documentation for accuracy and completeness, and I agree with the above.  - Kinzey Sheriff, FNP-C.

## 2019-04-01 ENCOUNTER — Encounter (INDEPENDENT_AMBULATORY_CARE_PROVIDER_SITE_OTHER): Payer: Self-pay | Admitting: Family Medicine

## 2019-04-02 ENCOUNTER — Other Ambulatory Visit: Payer: Self-pay | Admitting: Internal Medicine

## 2019-04-02 ENCOUNTER — Other Ambulatory Visit: Payer: Self-pay

## 2019-04-02 ENCOUNTER — Encounter (HOSPITAL_COMMUNITY)
Admission: RE | Admit: 2019-04-02 | Discharge: 2019-04-02 | Disposition: A | Discharge status: Home / Self Care | Payer: BC Managed Care – PPO | Source: Ambulatory Visit | Attending: Hematology | Admitting: Hematology

## 2019-04-02 DIAGNOSIS — C801 Malignant (primary) neoplasm, unspecified: Secondary | ICD-10-CM | POA: Diagnosis present

## 2019-04-02 DIAGNOSIS — C7951 Secondary malignant neoplasm of bone: Secondary | ICD-10-CM

## 2019-04-02 DIAGNOSIS — B20 Human immunodeficiency virus [HIV] disease: Secondary | ICD-10-CM

## 2019-04-02 LAB — GLUCOSE, CAPILLARY: Glucose-Capillary: 88 mg/dL (ref 70–99)

## 2019-04-02 MED ORDER — FLUDEOXYGLUCOSE F - 18 (FDG) INJECTION
12.5000 | Freq: Once | INTRAVENOUS | Status: AC | PRN
  Administered 2019-04-02: 12.5 via INTRAVENOUS

## 2019-04-03 ENCOUNTER — Other Ambulatory Visit: Payer: BC Managed Care – PPO

## 2019-04-03 DIAGNOSIS — B20 Human immunodeficiency virus [HIV] disease: Secondary | ICD-10-CM

## 2019-04-03 LAB — T-HELPER CELL (CD4) - (RCID CLINIC ONLY)
CD4 % Helper T Cell: 39 % (ref 33–55)
CD4 T Cell Abs: 540 /uL (ref 400–2700)

## 2019-04-08 NOTE — Progress Notes (Signed)
HEMATOLOGY/ONCOLOGY CONSULTATION NOTE  Date of Service: 04/09/2019  Patient Care Team: Tysinger, Camelia Eng, PA-C as PCP - General (Family Medicine) Michel Bickers, MD as PCP - Infectious Diseases (Infectious Diseases) Servando Salina, MD as Consulting Physician (Obstetrics and Gynecology) Georgia Lopes, DO as Consulting Physician (Bariatrics)  CHIEF COMPLAINTS/PURPOSE OF CONSULTATION:  Sclerotic Bone Lesions  HISTORY OF PRESENTING ILLNESS:   Margaret Porter is a wonderful 49 y.o. female who has been referred to [name] by Margaret Bode, PA-C for evaluation and management of Sclerotic Bone Lesions. The pt reports that she is doing well overall.   The pt reports that she had appendicitis in September 2018, and had a CT A/P which revealed multiple foci of sclerosis within the spine and pelvic bones. She notes that this was not followed up on until a recent physical.   The pt notes that she has occasional knee and hip pain, and denies any other bone pains or new back pains. She has some occasional mild chest pain which presents when she bends over. The pt is now taking 50k units of Vitamin D once a week.   She denies changes in breathing, new headaches, changes in bowel habits, changes in urinary habits, fevers, chills, night sweats, or unexpected weight loss.   The pt takes Genvoya, diagnosed with HIV in 2003, last viral load was undetected, last CD4 was in the 700s and follows with Margaret Porter in [place]. The pt has not needed to take preventative antibiotics, and has not had problems with opportunistic infections.  The pt notes that her mother has sickle cell trait, and is unsure if she herself has the trait as well.  The pt notes that she is intending to start a diet and exercise routine to lose weight.   She has not had any changes in bowel habits. She notes that her last PAP smear revealed a yeast infection, but other pap smears have been normal. She has had cervical polyps removed  in the past. She has not had anal pap smears. She is up to speed with her mammograms. She is 49 years old and will be having her first colonoscopy later this month.  The pt is a middle school PE teacher.  Of note prior to the patient's visit today, pt has had a CT Chest completed on 02/11/19 with results revealing No lung masses or nodules. No acute findings. 2. 15 mm left breast mass. Although this is likely a benign lesion such as a fibroadenoma, in light of the skeletal lesions, diagnostic mammography with possible left breast ultrasound is recommended. 3. Multiple sclerotic bone lesions consistent with metastatic disease.  Most recent lab results (12/02/18) of CBC w/diff is as follows: all values are WNL. 02/03/19 SPEP revealed all values WNL, including the absence of an M spike.  On review of systems, pt reports good energy levels, eating well, stable weight, moving her bowels well, and denies progressive symptoms, fevers, chills, night sweats, unexpected weight loss, changes in bowel habits, abdominal pains, specific back pain, pelvic pain, new headaches, frequent infections, changes in breathing, and any other symptoms.   On PMHx the pt reports HIV, rosacea, Vitamin D deficiency. 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 The patient's last visit with [name] was on 03/05/19. The pt reports that she is doing well overall.   The pt reports that she has not developed any new concerns in the interim. She has been avoiding crowds and denies any concerns for infections. The pt is taking 50k units of Vitamin D once a week. She denies any overt bone pains and denies any history of kidney stones.  Of note since the patient's last  visit, pt has had a PET/CT completed on 04/02/19 with results revealing No evidence of hypermetabolic primary malignancy or soft tissue metastasis.  The pt also had a colonoscopy on 03/09/19 which revealed a few small mouthed diverticula, and small non-bleeding internal hemorrhoids.  Lab results (04/03/19) of CBC w/diff and CMP is as follows: all values are WNL except for Total Protein at 5.9. 03/05/19 Vitamin D at 34.8 03/05/19 Angiotensin converting enzyme at 43  On review of systems, pt reports good energy levels, eating well, and denies bone pains, back pain, unexpected weight loss, concerns for infections, and any other symptoms.    MEDICAL HISTORY:  Past Medical History:  Diagnosis Date  . Allergic rhinitis   . Allergy   . Anemia   . Food allergy   . HIV infection ([place]) 2006  . Idiopathic urticaria   . Joint pain   . Lytic bone lesions on xray    bone lesions on hip,sternum and spine  . Obesity   . Pre-diabetes    pt not on meds  . Rosacea   . Sickle cell anemia (HCC)    Sickle cell trait with mom  . Swelling of both lower extremities    per pt, left side is more swollen  . Vitamin D deficiency     SURGICAL HISTORY: Past Surgical History:  Procedure Laterality Date  . APPENDECTOMY    . DILATATION & CURRETTAGE/HYSTEROSCOPY WITH RESECTOCOPE N/A 06/27/2015   Procedure: DILATATION & CURETTAGE/HYSTEROSCOPY WITH RESECTOCOPE;  Surgeon: Servando Salina, MD;  Location: [facility];  Service: Gynecology;  Laterality: N/A;  . LAPAROSCOPIC APPENDECTOMY N/A 09/07/2017   Procedure: APPENDECTOMY LAPAROSCOPIC;  Surgeon: Clovis Riley, MD;  Location: [facility];  Service: General;  Laterality: N/A;    SOCIAL HISTORY: Social History   Socioeconomic History  . Marital status: Single    Spouse name: Not on file  . Number of children: Not on file  . Years of education: Not on file  . Highest education level: Not on file  Occupational History  . Occupation: Art therapist  Social Needs  .  Financial resource strain: Not on file  . Food insecurity:    Worry: Not on file    Inability: Not on file  . Transportation needs:    Medical: Not on file    Non-medical: Not on file  Tobacco Use  . Smoking status: Never Smoker  . Smokeless tobacco: Never Used  Substance and Sexual Activity  . Alcohol use: Yes    Alcohol/week: 0.0 standard drinks    Comment: occasional (special occasions)  . Drug use: No  . Sexual activity: Never    Partners: Male    Birth control/protection: Condom    Comment: given condoms  Lifestyle  . Physical activity:    Days per week: Not on file    Minutes per session: Not on file  . Stress: Not on file  Relationships  . Social connections:    Talks on phone: Not on file    Gets together: Not on file  Attends religious service: Not on file    Active member of club or organization: Not on file    Attends meetings of clubs or organizations: Not on file    Relationship status: Not on file  . Intimate partner violence:    Fear of current or ex partner: Not on file    Emotionally abused: Not on file    Physically abused: Not on file    Forced sexual activity: Not on file  Other Topics Concern  . 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   Single, no children    FAMILY HISTORY: Family History  Problem Relation Age of Onset  . Diabetes Mother   . Diabetes Brother   . Heart disease Paternal Uncle   . Heart disease Maternal Grandmother   . Cancer Maternal Grandmother   . Heart disease Paternal Grandmother   . Diabetes Father   . High blood pressure Father   . Colon cancer Neg Hx   . Colon polyps Neg Hx   . Esophageal cancer Neg Hx   . Rectal cancer Neg Hx   . Stomach cancer Neg Hx     ALLERGIES:  is allergic to molds & smuts; oxycodone; red dye; amoxicillin; aspirin; and sulfamethoxazole-trimethoprim.  MEDICATIONS:  Current Outpatient Medications  Medication Sig Dispense Refill  .  diphenhydrAMINE (BENADRYL) 25 MG tablet Take 25 mg by mouth every 6 (six) hours as needed for itching or allergies. Reported on 02/29/2016    . GENVOYA 150-150-200-10 MG TABS tablet TAKE ONE TABLET BY MOUTH ONCE DAILY WITH BREAKFAST. STORE IN ORIGINALCONTAINER AT ROOM TEMPERATURE. 30 tablet 0  . metFORMIN (GLUCOPHAGE) 500 MG tablet Take 1 tablet (500 mg total) by mouth daily with breakfast. (Patient not taking: Reported on 03/11/2019) 30 tablet 0  . Vitamin D, Ergocalciferol, (DRISDOL) 1.25 MG (50000 UT) CAPS capsule Take 1 capsule (50,000 Units total) by mouth 2 (two) times a week. 24 capsule 1   No current facility-administered medications for this visit.     REVIEW OF SYSTEMS:    A 10+ POINT REVIEW OF SYSTEMS WAS OBTAINED including neurology, dermatology, psychiatry, cardiac, respiratory, lymph, extremities, GI, GU, Musculoskeletal, constitutional, breasts, reproductive, HEENT.  All pertinent positives are noted in the HPI.  All others are negative.   PHYSICAL EXAMINATION: ECOG PERFORMANCE STATUS: 0 - Asymptomatic  . Vitals:   04/09/19 1447  BP: 135/87  Pulse: 100  Resp: 18  Temp: 98.2 F (36.8 C)  SpO2: 98%   Filed Weights   04/09/19 1447  Weight: 254 lb 9.6 oz (115.5 kg)   .Body mass index is 45.1 kg/m.  GENERAL:alert, in no acute distress and comfortable SKIN: no acute rashes, no significant lesions EYES: conjunctiva are pink and non-injected, sclera anicteric OROPHARYNX: MMM, no exudates, no oropharyngeal erythema or ulceration NECK: supple, no JVD LYMPH:  no palpable lymphadenopathy in the cervical, axillary or inguinal regions LUNGS: clear to auscultation b/l with normal respiratory effort HEART: regular rate & rhythm ABDOMEN:  normoactive bowel sounds , non tender, not distended. No palpable hepatosplenomegaly.  Extremity: no pedal edema PSYCH: alert & oriented x 3 with fluent speech NEURO: no focal motor/sensory deficits   LABORATORY DATA:  I have reviewed the  data as listed  . CBC Latest Ref Rng & Units 04/03/2019 03/05/2019 12/02/2018  WBC 3.8 - 10.8 Thousand/uL 4.8 5.0 4.9  Hemoglobin 11.7 - 15.5 g/dL 13.4 13.1 13.3  Hematocrit 35.0 - 45.0 % 40.3  39.9 39.4  Platelets 140 - 400 Thousand/uL 249 232 301    . CMP Latest Ref Rng & Units 04/03/2019 03/05/2019 02/16/2019  Glucose 65 - 99 mg/dL 94 90 97  BUN 7 - 25 mg/dL _0 Creatinine 0.50 - 1.05 mg/dL 0.94 0.86 0.99  Sodium 135 - 146 mmol/L 139 139 139  Potassium 3.5 - 5.3 mmol/L 4.5 3.8 4.4  Chloride 98 - 110 mmol/L 105 107 104  CO2 20 - 32 mmol/L _1 Calcium 8.6 - 10.4 mg/dL 8.8 8.6(L) 9.6  Total Protein 6.1 - 8.1 g/dL 5.9(L) 6.5 6.6  Total Bilirubin 0.2 - 1.2 mg/dL 0.3 0.4 0.3  Alkaline Phos 38 - 126 U/L - 128(H) -  AST 10 - 35 U/L _2 ALT 6 - 29 U/L _3 RADIOGRAPHIC STUDIES: I have personally reviewed the radiological images as listed and agreed with the findings in the report. Nm Pet Image Initial (pi) Skull Base To Thigh  Result Date: 04/02/2019 CLINICAL DATA:  Initial treatment strategy for sclerotic bone lesions. History of HIV /ids. Evaluate for primary malignancy. EXAM: NUCLEAR MEDICINE PET SKULL BASE TO THIGH TECHNIQUE: 12.5 mCi F-18 FDG was injected intravenously. Full-ring PET imaging was performed from the skull base to thigh after the radiotracer. CT data was obtained and used for attenuation correction and anatomic localization. Fasting blood glucose: 88 mg/dl COMPARISON:  Chest CT of 01/29/2019. Of 02/11/2019. Abdominopelvic CT FINDINGS: Mediastinal blood pool activity: SUV max 4.1 NECK: No areas of abnormal hypermetabolism. Incidental CT findings: No cervical adenopathy. CHEST: No pulmonary parenchymal or thoracic nodal hypermetabolism. Incidental CT findings: Tiny hiatal hernia. No thoracic adenopathy. The left breast nodule detailed on prior diagnostic chest CT is not hypermetabolic, including on image 77/4. ABDOMEN/PELVIS: No abdominopelvic parenchymal or  nodal hypermetabolism. Incidental CT findings: Normal adrenal glands. No abdominopelvic adenopathy. Suspect underlying uterine fibroids, given somewhat globular appearance of the uterus. SKELETON: Mildly heterogeneous marrow activity, felt to be within normal variation. Areas of relative increased activity are not felt to correspond to the multifocal sclerotic lesions described on prior diagnostic CTs. Incidental CT findings: No new superimposed osseous abnormality. IMPRESSION: No evidence of hypermetabolic primary malignancy or soft tissue metastasis. Electronically Signed   By: Abigail Miyamoto M.D.   On: 04/02/2019 08:59    ASSESSMENT & PLAN:  49 y.o. female with  1. Sclerotic Bone Lesions 09/07/17 CT A/P in the setting of active appendicitis which revealed multiple foci of sclerosis within the spine and pelvic bones 11/26/18 Bilateral mammogram did not observe suspicious findings in the left breast, but noted an indeterminate focal 2cm asymmetry in the right breast, which was subsequently [name] and then biopsied on 12/22/18 with needle and revealed a fibroadenoma. 01/29/19 CT A/P revealed Numerous sclerotic osseous lesions, the majority of which are unchanged though with mild progression of a few lesions. These remain indeterminate with both malignant (metastatic disease and lymphoma) and benign conditions (sclerosing skeletal dysplasias and sarcoidosis for example) on the differential. 2. No other evidence of malignancy or acute abnormality in the abdomen or pelvis. 3. Nonobstructing left renal calculus.  02/11/19 CT Chest revealed No lung masses or nodules.  No acute findings. 2. 15 mm left breast mass. Although this is likely a benign lesion such as a fibroadenoma, in light of the skeletal lesions, diagnostic mammography with possible left breast ultrasound is recommended. 3. Multiple sclerotic bone lesions consistent with metastatic disease. -Believe that the CT Chest  could be misread, and should have noted  a RIGHT breast mass, rather than left.  Labs upon initial presentation: 02/03/19 SPEP was normal without an M spike detected. 12/02/18 CBC w/diff was normal.  PLAN: -Discussed pt labwork from 04/03/19; blood counts and chemistries are normal. 03/05/19 Vitamin D shown to be improving to 34.8. -Discussed the 04/02/19 PET/CT which revealed No evidence of hypermetabolic primary malignancy or soft tissue metastasis. -03/05/19 Angiotensin converting enzyme normal at 43, reassuring against sarcoidosis -Last CD4 counts on 04/03/19 above 500, reassuring against developing a lymphoma with well controlled HIV -Tenofovir and HIV itself should cause some bone loss and bone changes -Last PTH from November 29, 2014 was elevated at 139, and EMR doesn't show evidence of this being repeated. No hx of kidney stones. No overt bone pains. Previously elevated Alk Phos suggesting increased bone turnover. -Will order blood tests today to check PTH (recheck PTH WNL at 24) -No concern for inflammation nor malignancy at this time given PET/CT findings. No indication for a BM Bx or other tissue biopsy at this time  -Recommend increasing Vitamin D to 50k twice per week and optimizing levels between 60-90 with PCP -Recommend obtaining a Bone Density study with PCP given Vitamin D deficiency and HIV medication, and treating osteoporosis or osteopenia aggressively with PCP -Will check interval bone scan in 6 months -Will see the pt back in 6 months   Labs today Bone scan in 24 weeks RTC with Dr Irene Limbo in 6 months with labs   All of the patients questions were answered with apparent satisfaction. The patient knows to call the clinic with any problems, questions or concerns.  The total time spent in the appt was 30 minutes and more than 50% was on counseling and direct patient cares.    Sullivan Lone MD MS AAHIVMS [facility] Hematology/Oncology Physician [facility]  (Office):       [phone] (Work cell):  [phone]  (Fax):           [phone]  04/09/2019 3:23 PM  I, Baldwin Jamaica, am acting as a scribe for Dr. Sullivan Lone.   .I have reviewed the above documentation for accuracy and completeness, and I agree with the above. Brunetta Genera MD

## 2019-04-09 ENCOUNTER — Other Ambulatory Visit: Payer: Self-pay

## 2019-04-09 ENCOUNTER — Inpatient Hospital Stay: Payer: BC Managed Care – PPO | Attending: Hematology | Admitting: Hematology

## 2019-04-09 ENCOUNTER — Inpatient Hospital Stay: Payer: BC Managed Care – PPO

## 2019-04-09 VITALS — BP 135/87 | HR 100 | Temp 98.2°F | Resp 18 | Ht 63.0 in | Wt 254.6 lb

## 2019-04-09 DIAGNOSIS — E559 Vitamin D deficiency, unspecified: Secondary | ICD-10-CM | POA: Diagnosis not present

## 2019-04-09 DIAGNOSIS — Z79899 Other long term (current) drug therapy: Secondary | ICD-10-CM | POA: Insufficient documentation

## 2019-04-09 DIAGNOSIS — M899 Disorder of bone, unspecified: Secondary | ICD-10-CM | POA: Insufficient documentation

## 2019-04-09 DIAGNOSIS — C7951 Secondary malignant neoplasm of bone: Secondary | ICD-10-CM

## 2019-04-09 DIAGNOSIS — K649 Unspecified hemorrhoids: Secondary | ICD-10-CM | POA: Diagnosis not present

## 2019-04-09 DIAGNOSIS — Z21 Asymptomatic human immunodeficiency virus [HIV] infection status: Secondary | ICD-10-CM | POA: Diagnosis not present

## 2019-04-09 DIAGNOSIS — K5730 Diverticulosis of large intestine without perforation or abscess without bleeding: Secondary | ICD-10-CM | POA: Diagnosis not present

## 2019-04-09 LAB — T4, FREE: Free T4: 0.97 ng/dL (ref 0.82–1.77)

## 2019-04-09 MED ORDER — VITAMIN D (ERGOCALCIFEROL) 1.25 MG (50000 UNIT) PO CAPS: 50000.0000 [IU] | ORAL_CAPSULE | ORAL | 1 refills | Status: DC

## 2019-04-10 ENCOUNTER — Telehealth: Payer: Self-pay | Admitting: Hematology

## 2019-04-10 LAB — HIV-1 RNA QUANT-NO REFLEX-BLD
HIV 1 RNA Quant: <20 copies/mL
HIV-1 RNA Quant, Log: <1.3 Log copies/mL

## 2019-04-10 LAB — CBC WITH DIFFERENTIAL/PLATELET
Absolute Monocytes: 394 cells/uL (ref 200–950)
Basophils Absolute: 19 cells/uL (ref 0–200)
Basophils Relative: 0.4 %
Eosinophils Absolute: 38 cells/uL (ref 15–500)
Eosinophils Relative: 0.8 %
HCT: 40.3 % (ref 35.0–45.0)
Hemoglobin: 13.4 g/dL (ref 11.7–15.5)
Lymphs Abs: 1306 cells/uL (ref 850–3900)
MCH: 31.2 pg (ref 27.0–33.0)
MCHC: 33.3 g/dL (ref 32.0–36.0)
MCV: 93.7 fL (ref 80.0–100.0)
MPV: 11.7 fL (ref 7.5–12.5)
Monocytes Relative: 8.2 %
Neutro Abs: 3043 cells/uL (ref 1500–7800)
Neutrophils Relative %: 63.4 %
Platelets: 249 10*3/uL (ref 140–400)
RBC: 4.3 10*6/uL (ref 3.80–5.10)
RDW: 13.1 % (ref 11.0–15.0)
Total Lymphocyte: 27.2 %
WBC: 4.8 10*3/uL (ref 3.8–10.8)

## 2019-04-10 LAB — COMPLETE METABOLIC PANEL WITH GFR
AG Ratio: 2.1 (calc) (ref 1.0–2.5)
ALT: 12 U/L (ref 6–29)
AST: 13 U/L (ref 10–35)
Albumin: 4 g/dL (ref 3.6–5.1)
Alkaline phosphatase (APISO): 120 U/L (ref 37–153)
BUN: 11 mg/dL (ref 7–25)
CO2: 27 mmol/L (ref 20–32)
Calcium: 8.8 mg/dL (ref 8.6–10.4)
Chloride: 105 mmol/L (ref 98–110)
Creat: 0.94 mg/dL (ref 0.50–1.05)
GFR, Est African American: 82 mL/min/{1.73_m2} (ref >=60)
GFR, Est Non African American: 71 mL/min/{1.73_m2} (ref >=60)
Globulin: 1.9 g/dL (calc) (ref 1.9–3.7)
Glucose, Bld: 94 mg/dL (ref 65–99)
Potassium: 4.5 mmol/L (ref 3.5–5.3)
Sodium: 139 mmol/L (ref 135–146)
Total Bilirubin: 0.3 mg/dL (ref 0.2–1.2)
Total Protein: 5.9 g/dL — ABNORMAL LOW (ref 6.1–8.1)

## 2019-04-10 LAB — TSH: TSH: 2.421 u[IU]/mL (ref 0.308–3.960)

## 2019-04-10 LAB — PTH, INTACT AND CALCIUM
Calcium, Total (PTH): 9.6 mg/dL (ref 8.7–10.2)
PTH: 24 pg/mL (ref 15–65)

## 2019-04-10 NOTE — Telephone Encounter (Signed)
Scheduled appt per 4/9 los. °

## 2019-04-13 ENCOUNTER — Ambulatory Visit (INDEPENDENT_AMBULATORY_CARE_PROVIDER_SITE_OTHER): Payer: BC Managed Care – PPO | Admitting: Family Medicine

## 2019-04-13 ENCOUNTER — Other Ambulatory Visit: Payer: Self-pay

## 2019-04-13 ENCOUNTER — Encounter (INDEPENDENT_AMBULATORY_CARE_PROVIDER_SITE_OTHER): Payer: Self-pay | Admitting: Family Medicine

## 2019-04-13 DIAGNOSIS — E8881 Metabolic syndrome: Secondary | ICD-10-CM | POA: Diagnosis not present

## 2019-04-13 DIAGNOSIS — Z6841 Body Mass Index (BMI) 40.0 and over, adult: Secondary | ICD-10-CM | POA: Diagnosis not present

## 2019-04-13 DIAGNOSIS — E66813 Obesity, class 3: Secondary | ICD-10-CM

## 2019-04-13 DIAGNOSIS — E88819 Insulin resistance, unspecified: Secondary | ICD-10-CM

## 2019-04-14 ENCOUNTER — Encounter (INDEPENDENT_AMBULATORY_CARE_PROVIDER_SITE_OTHER): Payer: Self-pay | Admitting: Family Medicine

## 2019-04-14 NOTE — Progress Notes (Signed)
Office: [phone]  /  Fax: [phone] TeleHealth Visit:  Margaret Porter has verbally consented to this TeleHealth visit today. The patient is located at home, the provider is located at the [employer] [facility] office. The participants in this visit include the listed provider and patient. The visit was conducted today via Webex.  HPI:   Chief Complaint: OBESITY Bobby is here to discuss her progress with her obesity treatment plan. She is on the Category 3 plan and journaling 450-600 calories and 40+ grams of protein at supper. She reports following her eating plan approximately 80% of the time. She states she is exercising 0 minutes 0 times per week. Trinh states she journals occasionally at dinner. She reports finding most of the food she needs in the stores. She reports skipping breakfast often and does not eat all of the protein on the plan. She is currently working from home. We were unable to weigh the patient today for this TeleHealth visit. She is unsure if she has gained or lost weight since her last visit. She has lost 13 lbs since starting treatment with [name].  Insulin Resistance Ivan has a diagnosis of insulin resistance based on her elevated fasting insulin level >5. Although Starlina's blood glucose readings are still under good control, insulin resistance puts her at greater risk of metabolic syndrome and diabetes. She started metformin and feels it helps with hunger and is tolerating it well. She continues to work on diet and exercise to decrease risk of diabetes. She denies nausea, vomiting, diarrhea, or polyphagia. Lab Results  Component Value Date   HGBA1C 5.3 12/02/2018     ASSESSMENT AND PLAN:  Insulin resistance  Class 3 severe obesity with serious comorbidity and body mass index (BMI) of 45.0 to 49.9 in adult, unspecified obesity type ([place])  PLAN:  Insulin Resistance Amalia will continue to work on weight loss, exercise, and decreasing  simple carbohydrates in her diet to help decrease the risk of diabetes. Khalaya is currently taking metformin and prescription was not written today. Keisa agreed to follow-up with [name] as directed to monitor her progress.  I spent > than 50% of the 15 minute visit on counseling as documented in the note.  Obesity Alicia is currently in the action stage of change. As such, her goal is to continue with weight loss efforts. She has agreed to follow the Category 3 plan. We discussed the following Behavioral Modification Stratagies today: increasing lean protein intake, no skipping meals, and planning for success. She has not been prescribed exercise at this time.  Inge has agreed to follow-up with our clinic in 2 weeks. She was informed of the importance of frequent follow-up visits to maximize her success with intensive lifestyle modifications for her multiple health conditions.  ALLERGIES: Allergies  Allergen Reactions  . Molds & Smuts     SOB, chest congestion  . Oxycodone     Questionable mouth itching, sweats, but was also simultaneously on Cipro, Flagyl, and Robaxin.  [name] Kitchen Red Dye     Caused a rash with large amounts  . Amoxicillin Rash  . Aspirin Anxiety  . Sulfamethoxazole-Trimethoprim Rash    MEDICATIONS: Current Outpatient Medications on File Prior to Visit  Medication Sig Dispense Refill  . diphenhydrAMINE (BENADRYL) 25 MG tablet Take 25 mg by mouth every 6 (six) hours as needed for itching or allergies. Reported on 02/29/2016    . GENVOYA 150-150-200-10 MG TABS tablet TAKE ONE TABLET BY MOUTH ONCE DAILY WITH BREAKFAST. STORE  IN ORIGINALCONTAINER AT ROOM TEMPERATURE. 30 tablet 0  . metFORMIN (GLUCOPHAGE) 500 MG tablet Take 1 tablet (500 mg total) by mouth daily with breakfast. (Patient not taking: Reported on 03/11/2019) 30 tablet 0  . Vitamin D, Ergocalciferol, (DRISDOL) 1.25 MG (50000 UT) CAPS capsule Take 1 capsule (50,000 Units total) by mouth 2 (two) times a week. 24  capsule 1   No current facility-administered medications on file prior to visit.     PAST MEDICAL HISTORY: Past Medical History:  Diagnosis Date  . Allergic rhinitis   . Allergy   . Anemia   . Food allergy   . HIV infection ([place]) 2006  . Idiopathic urticaria   . Joint pain   . Lytic bone lesions on xray    bone lesions on hip,sternum and spine  . Obesity   . Pre-diabetes    pt not on meds  . Rosacea   . Sickle cell anemia (HCC)    Sickle cell trait with mom  . Swelling of both lower extremities    per pt, left side is more swollen  . Vitamin D deficiency     PAST SURGICAL HISTORY: Past Surgical History:  Procedure Laterality Date  . APPENDECTOMY    . DILATATION & CURRETTAGE/HYSTEROSCOPY WITH RESECTOCOPE N/A 06/27/2015   Procedure: DILATATION & CURETTAGE/HYSTEROSCOPY WITH RESECTOCOPE;  Surgeon: Servando Salina, MD;  Location: [facility];  Service: Gynecology;  Laterality: N/A;  . LAPAROSCOPIC APPENDECTOMY N/A 09/07/2017   Procedure: APPENDECTOMY LAPAROSCOPIC;  Surgeon: Clovis Riley, MD;  Location: [facility];  Service: General;  Laterality: N/A;    SOCIAL HISTORY: Social History   Tobacco Use  . Smoking status: Never Smoker  . Smokeless tobacco: Never Used  Substance Use Topics  . Alcohol use: Yes    Alcohol/week: 0.0 standard drinks    Comment: occasional (special occasions)  . Drug use: No    FAMILY HISTORY: Family History  Problem Relation Age of Onset  . Diabetes Mother   . Diabetes Brother   . Heart disease Paternal Uncle   . Heart disease Maternal Grandmother   . Cancer Maternal Grandmother   . Heart disease Paternal Grandmother   . Diabetes Father   . High blood pressure Father   . Colon cancer Neg Hx   . Colon polyps Neg Hx   . Esophageal cancer Neg Hx   . Rectal cancer Neg Hx   . Stomach cancer Neg Hx    ROS: Review of Systems  Gastrointestinal: Negative for diarrhea, nausea and vomiting.  Endo/Heme/Allergies:       Negative for polyphagia.    PHYSICAL EXAM: Pt in no acute distress  RECENT LABS AND TESTS: BMET    Component Value Date/Time   NA 139 04/03/2019 0913   K 4.5 04/03/2019 0913   CL 105 04/03/2019 0913   CO2 27 04/03/2019 0913   GLUCOSE 94 04/03/2019 0913   BUN 11 04/03/2019 0913   CREATININE 0.94 04/03/2019 0913   CALCIUM 9.6 04/09/2019 1534   GFRNONAA 71 04/03/2019 0913   GFRAA 82 04/03/2019 0913   Lab Results  Component Value Date   HGBA1C 5.3 12/02/2018   HGBA1C 5.4 11/12/2018   HGBA1C 5.3 02/14/2018   HGBA1C 5.1 04/04/2017   HGBA1C 5.7 (H) 02/13/2016   Lab Results  Component Value Date   INSULIN 21.1 11/12/2018   CBC    Component Value Date/Time   WBC 4.8 04/03/2019 0913   RBC 4.30 04/03/2019 0913   HGB 13.4 04/03/2019 0913  HGB 14.1 11/12/2018 0938   HCT 40.3 04/03/2019 0913   HCT 42.9 11/12/2018 0938   PLT 249 04/03/2019 0913   PLT 257 11/12/2018 0938   MCV 93.7 04/03/2019 0913   MCV 94 11/12/2018 0938   MCH 31.2 04/03/2019 0913   MCHC 33.3 04/03/2019 0913   RDW 13.1 04/03/2019 0913   RDW 13.2 11/12/2018 0938   LYMPHSABS 1,306 04/03/2019 0913   LYMPHSABS 0.9 11/12/2018 0938   MONOABS 0.4 03/05/2019 1235   EOSABS 38 04/03/2019 0913   EOSABS 0.0 11/12/2018 0938   BASOSABS 19 04/03/2019 0913   BASOSABS 0.0 11/12/2018 0938   Iron/TIBC/Ferritin/ %Sat No results found for: IRON, TIBC, FERRITIN, IRONPCTSAT Lipid Panel     Component Value Date/Time   CHOL 149 12/02/2018 0810   TRIG 95 12/02/2018 0810   HDL 34 (L) 12/02/2018 0810   CHOLHDL 4.4 12/02/2018 0810   VLDL 24 04/04/2017 0935   LDLCALC 96 12/02/2018 0810   Hepatic Function Panel     Component Value Date/Time   PROT 5.9 (L) 04/03/2019 0913   PROT 6.0 02/03/2019 0920   ALBUMIN 3.9 03/05/2019 1235   AST 13 04/03/2019 0913   AST 15 03/05/2019 1235   ALT 12 04/03/2019 0913   ALT 18 03/05/2019 1235   ALKPHOS 128 (H) 03/05/2019 1235   BILITOT 0.3 04/03/2019 0913   BILITOT 0.4 03/05/2019 1235   BILIDIR 0.1  12/22/2015 0001   IBILI 0.3 12/22/2015 0001      Component Value Date/Time   TSH 2.421 04/09/2019 1534   TSH 2.170 11/12/2018 0938   TSH 1.985 11/29/2014 1029   Results for FOTINI, LEMUS (MRN [id number]) as of 04/14/2019 07:32  Ref. Range 03/05/2019 12:35  Vitamin D, 25-Hydroxy Latest Ref Range: 30.0 - 100.0 ng/mL 34.8   I, Michaelene Song, am acting as Location manager for [employer], FNP-C.  I have reviewed the above documentation for accuracy and completeness, and I agree with the above.  - Lakara Weiland, FNP-C.

## 2019-04-28 ENCOUNTER — Encounter (INDEPENDENT_AMBULATORY_CARE_PROVIDER_SITE_OTHER): Payer: Self-pay | Admitting: Family Medicine

## 2019-04-28 ENCOUNTER — Ambulatory Visit (INDEPENDENT_AMBULATORY_CARE_PROVIDER_SITE_OTHER): Payer: BC Managed Care – PPO | Admitting: Family Medicine

## 2019-04-28 ENCOUNTER — Other Ambulatory Visit: Payer: Self-pay

## 2019-04-28 DIAGNOSIS — Z6841 Body Mass Index (BMI) 40.0 and over, adult: Secondary | ICD-10-CM

## 2019-04-28 DIAGNOSIS — E8881 Metabolic syndrome: Secondary | ICD-10-CM | POA: Diagnosis not present

## 2019-04-28 NOTE — Progress Notes (Signed)
Office: [phone]  /  Fax: [phone] TeleHealth Visit:  Margaret Porter has verbally consented to this TeleHealth visit today. The patient is located at home, the provider is located at the [employer] [facility] office. The participants in this visit include the listed provider and patient. The visit was conducted today via Webex.  HPI:   Chief Complaint: OBESITY Margaret Porter is here to discuss her progress with her obesity treatment plan. She is on the Category 3 plan and is following her eating plan approximately 85% of the time. She states she is exercising 0 minutes 0 times per week. Margaret Porter states she has gained 5 lbs since her last virtual visit. She reports being off track and is trying to get back on track. She is doing better with eating breakfast - she had been skipping. She reports eating foods that are not on the Category 3 plan. We were unable to weigh the patient today for this TeleHealth visit. She feels as if she has gained 5 lbs since her last visit. She has lost 13 lbs since starting treatment with [name].  Insulin Resistance Margaret Porter has a diagnosis of insulin resistance based on her elevated fasting insulin level >5. Although Margaret Porter's blood glucose readings are still under good control, insulin resistance puts her at greater risk of metabolic syndrome and diabetes. She is not taking metformin currently and continues to work on diet and exercise to decrease risk of diabetes. She reports polyphagia at night and is snacking too much at night on simple carbs. Lab Results  Component Value Date   HGBA1C 5.3 12/02/2018   ASSESSMENT AND PLAN:  Insulin resistance  Class 3 severe obesity with serious comorbidity and body mass index (BMI) of 45.0 to 49.9 in adult, unspecified obesity type ([place])  PLAN:  Insulin Resistance Saidy will continue to work on weight loss, exercise, and decreasing simple carbohydrates in her diet to help decrease the risk of diabetes. We  dicussed metformin including benefits and risks. She was informed that eating too many simple carbohydrates or too many calories at one sitting increases the likelihood of GI side effects. Margaret Porter will continue her meal plan and follow-up with [name] as directed to monitor her progress.  I spent > than 50% of the 15 minute visit on counseling as documented in the note.  Obesity Margaret Porter is currently in the action stage of change. As such, her goal is to continue with weight loss efforts. She has agreed to follow the Category 3 plan or journal 1500-1600 calories + 90-100 grams of protein daily. She was advised she may journal all day. Handouts were sent to the patient via MyChart on Protein Content and instructions for tracking protein in MyFitnessPal. Margaret Porter has been instructed to work up to a goal of 150 minutes of combined cardio and strengthening exercise per week for weight loss and overall health benefits. We discussed the following Behavioral Modification Strategies today: increase H20 intake, decrease simple carbs, and no skipping meals.  Margaret Porter has agreed to follow up with our clinic in 2 weeks. She was informed of the importance of frequent follow up visits to maximize her success with intensive lifestyle modifications for her multiple health conditions.  ALLERGIES: Allergies  Allergen Reactions  . Molds & Smuts     SOB, chest congestion  . Oxycodone     Questionable mouth itching, sweats, but was also simultaneously on Cipro, Flagyl, and Robaxin.  Margaret Porter Kitchen Red Dye     Caused a rash with large amounts  .  Amoxicillin Rash  . Aspirin Anxiety  . Sulfamethoxazole-Trimethoprim Rash    MEDICATIONS: Current Outpatient Medications on File Prior to Visit  Medication Sig Dispense Refill  . diphenhydrAMINE (BENADRYL) 25 MG tablet Take 25 mg by mouth every 6 (six) hours as needed for itching or allergies. Reported on 02/29/2016    . GENVOYA 150-150-200-10 MG TABS tablet TAKE ONE TABLET BY MOUTH  ONCE DAILY WITH BREAKFAST. STORE IN ORIGINALCONTAINER AT ROOM TEMPERATURE. 30 tablet 0  . metFORMIN (GLUCOPHAGE) 500 MG tablet Take 1 tablet (500 mg total) by mouth daily with breakfast. (Patient not taking: Reported on 03/11/2019) 30 tablet 0  . Vitamin D, Ergocalciferol, (DRISDOL) 1.25 MG (50000 UT) CAPS capsule Take 1 capsule (50,000 Units total) by mouth 2 (two) times a week. 24 capsule 1   No current facility-administered medications on file prior to visit.     PAST MEDICAL HISTORY: Past Medical History:  Diagnosis Date  . Allergic rhinitis   . Allergy   . Anemia   . Food allergy   . HIV infection ([place]) 2006  . Idiopathic urticaria   . Joint pain   . Lytic bone lesions on xray    bone lesions on hip,sternum and spine  . Obesity   . Pre-diabetes    pt not on meds  . Rosacea   . Sickle cell anemia (HCC)    Sickle cell trait with mom  . Swelling of both lower extremities    per pt, left side is more swollen  . Vitamin D deficiency     PAST SURGICAL HISTORY: Past Surgical History:  Procedure Laterality Date  . APPENDECTOMY    . DILATATION & CURRETTAGE/HYSTEROSCOPY WITH RESECTOCOPE N/A 06/27/2015   Procedure: DILATATION & CURETTAGE/HYSTEROSCOPY WITH RESECTOCOPE;  Surgeon: Servando Salina, MD;  Location: [facility];  Service: Gynecology;  Laterality: N/A;  . LAPAROSCOPIC APPENDECTOMY N/A 09/07/2017   Procedure: APPENDECTOMY LAPAROSCOPIC;  Surgeon: Clovis Riley, MD;  Location: [facility];  Service: General;  Laterality: N/A;    SOCIAL HISTORY: Social History   Tobacco Use  . Smoking status: Never Smoker  . Smokeless tobacco: Never Used  Substance Use Topics  . Alcohol use: Yes    Alcohol/week: 0.0 standard drinks    Comment: occasional (special occasions)  . Drug use: No    FAMILY HISTORY: Family History  Problem Relation Age of Onset  . Diabetes Mother   . Diabetes Brother   . Heart disease Paternal Uncle   . Heart disease Maternal Grandmother   . Cancer Maternal  Grandmother   . Heart disease Paternal Grandmother   . Diabetes Father   . High blood pressure Father   . Colon cancer Neg Hx   . Colon polyps Neg Hx   . Esophageal cancer Neg Hx   . Rectal cancer Neg Hx   . Stomach cancer Neg Hx    ROS: Review of Systems  Endo/Heme/Allergies:       Positive polyphagia at night.   PHYSICAL EXAM: Pt in no acute distress  RECENT LABS AND TESTS: BMET    Component Value Date/Time   NA 139 04/03/2019 0913   K 4.5 04/03/2019 0913   CL 105 04/03/2019 0913   CO2 27 04/03/2019 0913   GLUCOSE 94 04/03/2019 0913   BUN 11 04/03/2019 0913   CREATININE 0.94 04/03/2019 0913   CALCIUM 9.6 04/09/2019 1534   GFRNONAA 71 04/03/2019 0913   GFRAA 82 04/03/2019 0913   Lab Results  Component Value Date   HGBA1C  5.3 12/02/2018   HGBA1C 5.4 11/12/2018   HGBA1C 5.3 02/14/2018   HGBA1C 5.1 04/04/2017   HGBA1C 5.7 (H) 02/13/2016   Lab Results  Component Value Date   INSULIN 21.1 11/12/2018   CBC    Component Value Date/Time   WBC 4.8 04/03/2019 0913   RBC 4.30 04/03/2019 0913   HGB 13.4 04/03/2019 0913   HGB 14.1 11/12/2018 0938   HCT 40.3 04/03/2019 0913   HCT 42.9 11/12/2018 0938   PLT 249 04/03/2019 0913   PLT 257 11/12/2018 0938   MCV 93.7 04/03/2019 0913   MCV 94 11/12/2018 0938   MCH 31.2 04/03/2019 0913   MCHC 33.3 04/03/2019 0913   RDW 13.1 04/03/2019 0913   RDW 13.2 11/12/2018 0938   LYMPHSABS 1,306 04/03/2019 0913   LYMPHSABS 0.9 11/12/2018 0938   MONOABS 0.4 03/05/2019 1235   EOSABS 38 04/03/2019 0913   EOSABS 0.0 11/12/2018 0938   BASOSABS 19 04/03/2019 0913   BASOSABS 0.0 11/12/2018 0938   Iron/TIBC/Ferritin/ %Sat No results found for: IRON, TIBC, FERRITIN, IRONPCTSAT Lipid Panel     Component Value Date/Time   CHOL 149 12/02/2018 0810   TRIG 95 12/02/2018 0810   HDL 34 (L) 12/02/2018 0810   CHOLHDL 4.4 12/02/2018 0810   VLDL 24 04/04/2017 0935   LDLCALC 96 12/02/2018 0810   Hepatic Function Panel     Component  Value Date/Time   PROT 5.9 (L) 04/03/2019 0913   PROT 6.0 02/03/2019 0920   ALBUMIN 3.9 03/05/2019 1235   AST 13 04/03/2019 0913   AST 15 03/05/2019 1235   ALT 12 04/03/2019 0913   ALT 18 03/05/2019 1235   ALKPHOS 128 (H) 03/05/2019 1235   BILITOT 0.3 04/03/2019 0913   BILITOT 0.4 03/05/2019 1235   BILIDIR 0.1 12/22/2015 0001   IBILI 0.3 12/22/2015 0001      Component Value Date/Time   TSH 2.421 04/09/2019 1534   TSH 2.170 11/12/2018 0938   TSH 1.985 11/29/2014 1029   Results for LICIA, HARL (MRN [id number]) as of 04/28/2019 15:03  Ref. Range 03/05/2019 12:35  Vitamin D, 25-Hydroxy Latest Ref Range: 30.0 - 100.0 ng/mL 34.8   I, Michaelene Song, am acting as Location manager for [employer], FNP-C.  I have reviewed the above documentation for accuracy and completeness, and I agree with the above.  - Zubair Lofton, FNP-C.

## 2019-04-29 ENCOUNTER — Encounter (INDEPENDENT_AMBULATORY_CARE_PROVIDER_SITE_OTHER): Payer: Self-pay | Admitting: Family Medicine

## 2019-05-07 ENCOUNTER — Ambulatory Visit (INDEPENDENT_AMBULATORY_CARE_PROVIDER_SITE_OTHER): Payer: BC Managed Care – PPO | Admitting: Internal Medicine

## 2019-05-07 ENCOUNTER — Other Ambulatory Visit: Payer: Self-pay

## 2019-05-07 ENCOUNTER — Encounter: Payer: Self-pay | Admitting: Internal Medicine

## 2019-05-07 DIAGNOSIS — B20 Human immunodeficiency virus [HIV] disease: Secondary | ICD-10-CM

## 2019-05-07 MED ORDER — ELVITEG-COBIC-EMTRICIT-TENOFAF 150-150-200-10 MG PO TABS: ORAL_TABLET | ORAL | 11 refills | Status: DC

## 2019-05-07 NOTE — Progress Notes (Signed)
Virtual Visit via Telephone Note  I connected with Margaret Porter on 05/07/19 at 10:45 AM EDT by telephone and verified that I am speaking with the correct person using two identifiers.  Location: Patient: Work Provider: [employer] [facility]   I discussed the limitations, risks, security and privacy concerns of performing an evaluation and management service by telephone and the availability of in person appointments. I also discussed with the patient that there may be a patient responsible charge related to this service. The patient expressed understanding and agreed to proceed.   History of Present Illness: I called and spoke with Margaret Porter today.  She has had no problems obtaining, taking or tolerating her Genvoya and has not missed any doses.  She is feeling well.   Observations/Objective: HIV 1 RNA Quant  Date Value  04/03/2019 <20 NOT DETECTED copies/mL  12/02/2018 not detected copies  07/24/2010 39 copies/mL   HIV-1 RNA Viral Load (no units)  Date Value  02/16/2019 <40  08/19/2018 <40  06/11/2018 <40   CD4 (no units)  Date Value  11/08/2015 550  02/08/2015 443  12/21/2013 475   CD4 T Cell Abs (/uL)  Date Value  04/03/2019 540    Assessment and Plan: Her infection remains under excellent, long-term control.  Follow Up Instructions: Continue Genvoya and follow-up here after lab work in 6 months   I discussed the assessment and treatment plan with the patient. The patient was provided an opportunity to ask questions and all were answered. The patient agreed with the plan and demonstrated an understanding of the instructions.   The patient was advised to call back or seek an in-person evaluation if the symptoms worsen or if the condition fails to improve as anticipated.  I provided 8 minutes of non-face-to-face time during this encounter.   Michel Bickers, MD

## 2019-05-12 ENCOUNTER — Encounter (INDEPENDENT_AMBULATORY_CARE_PROVIDER_SITE_OTHER): Payer: Self-pay | Admitting: Family Medicine

## 2019-05-12 ENCOUNTER — Ambulatory Visit (INDEPENDENT_AMBULATORY_CARE_PROVIDER_SITE_OTHER): Payer: BC Managed Care – PPO | Admitting: Family Medicine

## 2019-05-12 ENCOUNTER — Other Ambulatory Visit: Payer: Self-pay

## 2019-05-12 DIAGNOSIS — Z6841 Body Mass Index (BMI) 40.0 and over, adult: Secondary | ICD-10-CM

## 2019-05-12 DIAGNOSIS — E8881 Metabolic syndrome: Secondary | ICD-10-CM

## 2019-05-12 NOTE — Progress Notes (Signed)
Office: [phone]  /  Fax: [phone] TeleHealth Visit:  Margaret Porter has verbally consented to this TeleHealth visit today. The patient is located at home, the provider is located at the [employer] [facility] office. The participants in this visit include the listed provider and patient. The visit was conducted today via Webex.  HPI:   Chief Complaint: OBESITY Margaret Porter is here to discuss her progress with her obesity treatment plan. She is on the Category 3 plan or journaling 1500-1600 calories + 90-100 grams of protein daily and is following her eating plan approximately 90% of the time. She states she is exercising 0 minutes 0 times per week. 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 She also reports skipping breakfast at times. We were unable to weigh the patient today for this TeleHealth visit. She feels as if she has gained 2 lbs since her last visit. She has lost 13 lbs since starting treatment with [name].  Insulin Resistance Margaret Porter has a diagnosis of insulin resistance based on her elevated fasting insulin level >5. Although Margaret Porter's blood glucose readings are still under good control, insulin resistance puts her at greater risk of metabolic syndrome and diabetes. She is not taking metformin currently and continues to work on diet and exercise to decrease risk of diabetes. No polyphagia. Lab Results  Component Value Date   HGBA1C 5.3 12/02/2018    ASSESSMENT AND PLAN:  Insulin resistance  Class 3 severe obesity with serious comorbidity and body mass index (BMI) of 45.0 to 49.9 in adult, unspecified obesity type (Margaret Porter)  PLAN:  Insulin Resistance Margaret Porter will continue to work on weight loss, exercise, and decreasing simple carbohydrates in her diet to help decrease the risk of diabetes. We dicussed metformin including benefits and risks. She was informed that eating too many simple carbohydrates or too many calories at one  sitting increases the likelihood of GI side effects. Margaret Porter will discontinue metformin and continue her meal plan. She agrees to follow-up with our clinic as directed to monitor her progress.  I spent > than 50% of the 15 minute visit on counseling as documented in the note.  Obesity Margaret Porter is currently in the action stage of change. As such, her goal is to continue with weight loss efforts. She has agreed to follow the Category 3 plan or journal 1500-1600 calories + 90-100 grams of protein daily. Add breakfast options (sent to patient via MyChart).  Margaret Porter has been instructed to start walking 30 minutes 3 days per week for weight loss and overall health benefits. We discussed the following Behavioral Modification Strategies today: increasing lean protein intake, increase H20 intake, no skipping meals, and planning for success.  Margaret Porter has agreed to follow-up with our clinic in 2 weeks. She was informed of the importance of frequent follow-up visits to maximize her success with intensive lifestyle modifications for her multiple health conditions.  ALLERGIES: Allergies  Allergen Reactions  . Molds & Smuts     SOB, chest congestion  . Oxycodone     Questionable mouth itching, sweats, but was also simultaneously on Cipro, Flagyl, and Robaxin.  [name] Kitchen Red Dye     Caused a rash with large amounts  . Amoxicillin Rash  . Aspirin Anxiety  . Sulfamethoxazole-Trimethoprim Rash    MEDICATIONS: Current Outpatient Medications on File Prior to Visit  Medication Sig Dispense Refill  . diphenhydrAMINE (BENADRYL) 25 MG tablet Take 25 mg by mouth every 6 (six) hours as needed  for itching or allergies. Reported on 02/29/2016    . elvitegravir-cobicistat-emtricitabine-tenofovir (GENVOYA) 150-150-200-10 MG TABS tablet TAKE ONE TABLET BY MOUTH ONCE DAILY WITH BREAKFAST. STORE IN ORIGINALCONTAINER AT ROOM TEMPERATURE. 30 tablet 11  . Vitamin D, Ergocalciferol, (DRISDOL) 1.25 MG (50000 UT) CAPS capsule Take  1 capsule (50,000 Units total) by mouth 2 (two) times a week. 24 capsule 1   No current facility-administered medications on file prior to visit.     PAST MEDICAL HISTORY: Past Medical History:  Diagnosis Date  . Allergic rhinitis   . Allergy   . Anemia   . Food allergy   . HIV infection ([place]) 2006  . Idiopathic urticaria   . Joint pain   . Lytic bone lesions on xray    bone lesions on hip,sternum and spine  . Obesity   . Pre-diabetes    pt not on meds  . Rosacea   . Sickle cell anemia (HCC)    Sickle cell trait with mom  . Swelling of both lower extremities    per pt, left side is more swollen  . Vitamin D deficiency     PAST SURGICAL HISTORY: Past Surgical History:  Procedure Laterality Date  . APPENDECTOMY    . DILATATION & CURRETTAGE/HYSTEROSCOPY WITH RESECTOCOPE N/A 06/27/2015   Procedure: DILATATION & CURETTAGE/HYSTEROSCOPY WITH RESECTOCOPE;  Surgeon: Servando Salina, MD;  Location: [facility];  Service: Gynecology;  Laterality: N/A;  . LAPAROSCOPIC APPENDECTOMY N/A 09/07/2017   Procedure: APPENDECTOMY LAPAROSCOPIC;  Surgeon: Clovis Riley, MD;  Location: [facility];  Service: General;  Laterality: N/A;    SOCIAL HISTORY: Social History   Tobacco Use  . Smoking status: Never Smoker  . Smokeless tobacco: Never Used  Substance Use Topics  . Alcohol use: Yes    Alcohol/week: 0.0 standard drinks    Comment: occasional (special occasions)  . Drug use: No    FAMILY HISTORY: Family History  Problem Relation Age of Onset  . Diabetes Mother   . Diabetes Brother   . Heart disease Paternal Uncle   . Heart disease Maternal Grandmother   . Cancer Maternal Grandmother   . Heart disease Paternal Grandmother   . Diabetes Father   . High blood pressure Father   . Colon cancer Neg Hx   . Colon polyps Neg Hx   . Esophageal cancer Neg Hx   . Rectal cancer Neg Hx   . Stomach cancer Neg Hx    ROS: Review of Systems  Endo/Heme/Allergies:       Negative for polyphagia.    PHYSICAL EXAM: Pt in no acute distress  RECENT LABS AND TESTS: BMET    Component Value Date/Time   NA 139 04/03/2019 0913   K 4.5 04/03/2019 0913   CL 105 04/03/2019 0913   CO2 27 04/03/2019 0913   GLUCOSE 94 04/03/2019 0913   BUN 11 04/03/2019 0913   CREATININE 0.94 04/03/2019 0913   CALCIUM 9.6 04/09/2019 1534   GFRNONAA 71 04/03/2019 0913   GFRAA 82 04/03/2019 0913   Lab Results  Component Value Date   HGBA1C 5.3 12/02/2018   HGBA1C 5.4 11/12/2018   HGBA1C 5.3 02/14/2018   HGBA1C 5.1 04/04/2017   HGBA1C 5.7 (H) 02/13/2016   Lab Results  Component Value Date   INSULIN 21.1 11/12/2018   CBC    Component Value Date/Time   WBC 4.8 04/03/2019 0913   RBC 4.30 04/03/2019 0913   HGB 13.4 04/03/2019 0913   HGB 14.1 11/12/2018 0938   HCT  40.3 04/03/2019 0913   HCT 42.9 11/12/2018 0938   PLT 249 04/03/2019 0913   PLT 257 11/12/2018 0938   MCV 93.7 04/03/2019 0913   MCV 94 11/12/2018 0938   MCH 31.2 04/03/2019 0913   MCHC 33.3 04/03/2019 0913   RDW 13.1 04/03/2019 0913   RDW 13.2 11/12/2018 0938   LYMPHSABS 1,306 04/03/2019 0913   LYMPHSABS 0.9 11/12/2018 0938   MONOABS 0.4 03/05/2019 1235   EOSABS 38 04/03/2019 0913   EOSABS 0.0 11/12/2018 0938   BASOSABS 19 04/03/2019 0913   BASOSABS 0.0 11/12/2018 0938   Iron/TIBC/Ferritin/ %Sat No results found for: IRON, TIBC, FERRITIN, IRONPCTSAT Lipid Panel     Component Value Date/Time   CHOL 149 12/02/2018 0810   TRIG 95 12/02/2018 0810   HDL 34 (L) 12/02/2018 0810   CHOLHDL 4.4 12/02/2018 0810   VLDL 24 04/04/2017 0935   LDLCALC 96 12/02/2018 0810   Hepatic Function Panel     Component Value Date/Time   PROT 5.9 (L) 04/03/2019 0913   PROT 6.0 02/03/2019 0920   ALBUMIN 3.9 03/05/2019 1235   AST 13 04/03/2019 0913   AST 15 03/05/2019 1235   ALT 12 04/03/2019 0913   ALT 18 03/05/2019 1235   ALKPHOS 128 (H) 03/05/2019 1235   BILITOT 0.3 04/03/2019 0913   BILITOT 0.4 03/05/2019 1235   BILIDIR 0.1  12/22/2015 0001   IBILI 0.3 12/22/2015 0001      Component Value Date/Time   TSH 2.421 04/09/2019 1534   TSH 2.170 11/12/2018 0938   TSH 1.985 11/29/2014 1029   Results for LAMEEKA, SCHLEIFER (MRN [id number]) as of 05/12/2019 14:34  Ref. Range 03/05/2019 12:35  Vitamin D, 25-Hydroxy Latest Ref Range: 30.0 - 100.0 ng/mL 34.8   I, Michaelene Song, am acting as Location manager for [employer], FNP-C.  I have reviewed the above documentation for accuracy and completeness, and I agree with the above.  - Brezlyn Manrique, FNP-C.

## 2019-05-13 ENCOUNTER — Encounter (INDEPENDENT_AMBULATORY_CARE_PROVIDER_SITE_OTHER): Payer: Self-pay | Admitting: Family Medicine

## 2019-05-19 ENCOUNTER — Other Ambulatory Visit: Payer: Self-pay | Admitting: Internal Medicine

## 2019-05-19 DIAGNOSIS — B20 Human immunodeficiency virus [HIV] disease: Secondary | ICD-10-CM

## 2019-05-27 ENCOUNTER — Ambulatory Visit (INDEPENDENT_AMBULATORY_CARE_PROVIDER_SITE_OTHER): Payer: BC Managed Care – PPO | Admitting: Family Medicine

## 2019-05-27 ENCOUNTER — Other Ambulatory Visit: Payer: Self-pay

## 2019-05-27 ENCOUNTER — Encounter (INDEPENDENT_AMBULATORY_CARE_PROVIDER_SITE_OTHER): Payer: Self-pay | Admitting: Family Medicine

## 2019-05-27 DIAGNOSIS — E8881 Metabolic syndrome: Secondary | ICD-10-CM

## 2019-05-27 DIAGNOSIS — Z6841 Body Mass Index (BMI) 40.0 and over, adult: Secondary | ICD-10-CM

## 2019-05-27 NOTE — Progress Notes (Signed)
Office: [phone]  /  Fax: [phone] TeleHealth Visit:  Margaret Porter has verbally consented to this TeleHealth visit today. The patient is located at home, the provider is located at the [facility] office. The participants in this visit include the listed provider and patient. The visit was conducted today via Webex.  HPI:   Chief Complaint: OBESITY Margaret Porter is here to discuss her progress with her obesity treatment plan. She is on the Category 3 plan or journaling 1500-1600 calories + 90-100 grams of protein daily with breakfast options and is following her eating plan approximately 85% of the time. She states she is exercising 0 minutes 0 times per week. Margaret Porter states she weighed 263 lbs today and thinks she has gained weight. She reports skipping breakfast even though she is hungry because she does not want to go downstairs at her home to prepare it. . She is trying to eat all protein on her plan but does not always succeed. We were unable to weigh the patient today for this TeleHealth visit. She reports her weight today is 263 lbs. She has lost 13 lbs since starting treatment with [name].  Insulin Resistance [place] has a diagnosis of insulin resistance based on her elevated fasting insulin level >5. Although Margaret Porter's blood glucose readings are still under good control, insulin resistance puts her at greater risk of metabolic syndrome and diabetes. She had previously been on metformin but this was discontinued because she was skipping meals. She continues to work on diet and exercise to decrease risk of diabetes. No polyphagia. Lab Results  Component Value Date   HGBA1C 5.3 12/02/2018    ASSESSMENT AND PLAN:  Insulin resistance  Class 3 severe obesity with serious comorbidity and body mass index (BMI) of 40.0 to 44.9 in adult, unspecified obesity type ([place])  PLAN:  Insulin Resistance Margaret Porter will continue to work on weight loss, exercise, and  decreasing simple carbohydrates in her diet to help decrease the risk of diabetes.  Margaret Porter will continue her meal plan and follow-up with [name] as directed to monitor her progress.  I spent > than 50% of the 15 minute visit on counseling as documented in the note.  Obesity Margaret Porter is currently in the action stage of change. As such, her goal is to continue with weight loss efforts. She has agreed to follow the Category 3 plan. Margaret Porter has been instructed to increase activity around the house and walk 20 minutes 3 days per week for weight loss and overall health benefits. We discussed the following Behavioral Modification Strategies today: increasing lean protein intake, no skipping meals, and planning for success.  Margaret Porter has agreed to follow-up with our clinic in 2-3 weeks. She was informed of the importance of frequent follow-up visits to maximize her success with intensive lifestyle modifications for her multiple health conditions.  ALLERGIES: Allergies  Allergen Reactions  . Molds & Smuts     SOB, chest congestion  . Oxycodone     Questionable mouth itching, sweats, but was also simultaneously on Cipro, Flagyl, and Robaxin.  [name] Kitchen Red Dye     Caused a rash with large amounts  . Amoxicillin Rash  . Aspirin Anxiety  . Sulfamethoxazole-Trimethoprim Rash    MEDICATIONS: Current Outpatient Medications on File Prior to Visit  Medication Sig Dispense Refill  . diphenhydrAMINE (BENADRYL) 25 MG tablet Take 25 mg by mouth every 6 (six) hours as needed for itching or allergies. Reported on 02/29/2016    . GENVOYA 150-150-200-10 MG  TABS tablet TAKE ONE TABLET BY MOUTH ONCE DAILY WITH BREAKFAST. STORE IN ORIGINALCONTAINER AT ROOM TEMPERATURE. 30 tablet 5  . Vitamin D, Ergocalciferol, (DRISDOL) 1.25 MG (50000 UT) CAPS capsule Take 1 capsule (50,000 Units total) by mouth 2 (two) times a week. 24 capsule 1   No current facility-administered medications on file prior to visit.     PAST MEDICAL  HISTORY: Past Medical History:  Diagnosis Date  . Allergic rhinitis   . Allergy   . Anemia   . Food allergy   . HIV infection ([place]) 2006  . Idiopathic urticaria   . Joint pain   . Lytic bone lesions on xray    bone lesions on hip,sternum and spine  . Obesity   . Pre-diabetes    pt not on meds  . Rosacea   . Sickle cell anemia (HCC)    Sickle cell trait with mom  . Swelling of both lower extremities    per pt, left side is more swollen  . Vitamin D deficiency     PAST SURGICAL HISTORY: Past Surgical History:  Procedure Laterality Date  . APPENDECTOMY    . DILATATION & CURRETTAGE/HYSTEROSCOPY WITH RESECTOCOPE N/A 06/27/2015   Procedure: DILATATION & CURETTAGE/HYSTEROSCOPY WITH RESECTOCOPE;  Surgeon: Servando Salina, MD;  Location: [facility];  Service: Gynecology;  Laterality: N/A;  . LAPAROSCOPIC APPENDECTOMY N/A 09/07/2017   Procedure: APPENDECTOMY LAPAROSCOPIC;  Surgeon: Clovis Riley, MD;  Location: [facility];  Service: General;  Laterality: N/A;    SOCIAL HISTORY: Social History   Tobacco Use  . Smoking status: Never Smoker  . Smokeless tobacco: Never Used  Substance Use Topics  . Alcohol use: Yes    Alcohol/week: 0.0 standard drinks    Comment: occasional (special occasions)  . Drug use: No    FAMILY HISTORY: Family History  Problem Relation Age of Onset  . Diabetes Mother   . Diabetes Brother   . Heart disease Paternal Uncle   . Heart disease Maternal Grandmother   . Cancer Maternal Grandmother   . Heart disease Paternal Grandmother   . Diabetes Father   . High blood pressure Father   . Colon cancer Neg Hx   . Colon polyps Neg Hx   . Esophageal cancer Neg Hx   . Rectal cancer Neg Hx   . Stomach cancer Neg Hx    ROS: Review of Systems  Endo/Heme/Allergies:       Negative for polyphagia.   PHYSICAL EXAM: Pt in no acute distress  RECENT LABS AND TESTS: BMET    Component Value Date/Time   NA 139 04/03/2019 0913   K 4.5 04/03/2019 0913   CL 105  04/03/2019 0913   CO2 27 04/03/2019 0913   GLUCOSE 94 04/03/2019 0913   BUN 11 04/03/2019 0913   CREATININE 0.94 04/03/2019 0913   CALCIUM 9.6 04/09/2019 1534   GFRNONAA 71 04/03/2019 0913   GFRAA 82 04/03/2019 0913   Lab Results  Component Value Date   HGBA1C 5.3 12/02/2018   HGBA1C 5.4 11/12/2018   HGBA1C 5.3 02/14/2018   HGBA1C 5.1 04/04/2017   HGBA1C 5.7 (H) 02/13/2016   Lab Results  Component Value Date   INSULIN 21.1 11/12/2018   CBC    Component Value Date/Time   WBC 4.8 04/03/2019 0913   RBC 4.30 04/03/2019 0913   HGB 13.4 04/03/2019 0913   HGB 14.1 11/12/2018 0938   HCT 40.3 04/03/2019 0913   HCT 42.9 11/12/2018 0938   PLT 249 04/03/2019 0913  PLT 257 11/12/2018 0938   MCV 93.7 04/03/2019 0913   MCV 94 11/12/2018 0938   MCH 31.2 04/03/2019 0913   MCHC 33.3 04/03/2019 0913   RDW 13.1 04/03/2019 0913   RDW 13.2 11/12/2018 0938   LYMPHSABS 1,306 04/03/2019 0913   LYMPHSABS 0.9 11/12/2018 0938   MONOABS 0.4 03/05/2019 1235   EOSABS 38 04/03/2019 0913   EOSABS 0.0 11/12/2018 0938   BASOSABS 19 04/03/2019 0913   BASOSABS 0.0 11/12/2018 0938   Iron/TIBC/Ferritin/ %Sat No results found for: IRON, TIBC, FERRITIN, IRONPCTSAT Lipid Panel     Component Value Date/Time   CHOL 149 12/02/2018 0810   TRIG 95 12/02/2018 0810   HDL 34 (L) 12/02/2018 0810   CHOLHDL 4.4 12/02/2018 0810   VLDL 24 04/04/2017 0935   LDLCALC 96 12/02/2018 0810   Hepatic Function Panel     Component Value Date/Time   PROT 5.9 (L) 04/03/2019 0913   PROT 6.0 02/03/2019 0920   ALBUMIN 3.9 03/05/2019 1235   AST 13 04/03/2019 0913   AST 15 03/05/2019 1235   ALT 12 04/03/2019 0913   ALT 18 03/05/2019 1235   ALKPHOS 128 (H) 03/05/2019 1235   BILITOT 0.3 04/03/2019 0913   BILITOT 0.4 03/05/2019 1235   BILIDIR 0.1 12/22/2015 0001   IBILI 0.3 12/22/2015 0001      Component Value Date/Time   TSH 2.421 04/09/2019 1534   TSH 2.170 11/12/2018 0938   TSH 1.985 11/29/2014 1029    Results for IDANIA, DESOUZA (MRN [id number]) as of 05/27/2019 14:41  Ref. Range 03/05/2019 12:35  Vitamin D, 25-Hydroxy Latest Ref Range: 30.0 - 100.0 ng/mL 34.8    I, Michaelene Song, am acting as Location manager for [employer], FNP-C.  I have reviewed the above documentation for accuracy and completeness, and I agree with the above.  -  , FNP-C.

## 2019-05-28 ENCOUNTER — Encounter (INDEPENDENT_AMBULATORY_CARE_PROVIDER_SITE_OTHER): Payer: Self-pay | Admitting: Family Medicine

## 2019-06-15 ENCOUNTER — Ambulatory Visit (INDEPENDENT_AMBULATORY_CARE_PROVIDER_SITE_OTHER): Payer: BC Managed Care – PPO | Admitting: Family Medicine

## 2019-06-15 ENCOUNTER — Encounter (INDEPENDENT_AMBULATORY_CARE_PROVIDER_SITE_OTHER): Payer: Self-pay | Admitting: Family Medicine

## 2019-06-15 ENCOUNTER — Other Ambulatory Visit: Payer: Self-pay

## 2019-06-15 VITALS — BP 119/81 | HR 71 | Temp 98.1°F | Ht 63.0 in | Wt 260.0 lb

## 2019-06-15 DIAGNOSIS — Z6841 Body Mass Index (BMI) 40.0 and over, adult: Secondary | ICD-10-CM

## 2019-06-15 DIAGNOSIS — E559 Vitamin D deficiency, unspecified: Secondary | ICD-10-CM

## 2019-06-15 DIAGNOSIS — F3289 Other specified depressive episodes: Secondary | ICD-10-CM

## 2019-06-15 DIAGNOSIS — Z9189 Other specified personal risk factors, not elsewhere classified: Secondary | ICD-10-CM | POA: Diagnosis not present

## 2019-06-15 MED ORDER — BUPROPION HCL ER (SR) 150 MG PO TB12: 150.0000 mg | ORAL_TABLET | Freq: Every day | ORAL | 0 refills | Status: DC

## 2019-06-16 ENCOUNTER — Encounter (INDEPENDENT_AMBULATORY_CARE_PROVIDER_SITE_OTHER): Payer: Self-pay | Admitting: Family Medicine

## 2019-06-18 ENCOUNTER — Encounter (INDEPENDENT_AMBULATORY_CARE_PROVIDER_SITE_OTHER): Payer: Self-pay

## 2019-06-22 NOTE — Progress Notes (Signed)
Office: [phone]  /  Fax: [phone]   HPI:   Chief Complaint: OBESITY Margaret Porter is here to discuss her progress with her obesity treatment plan. She is on the Category 3 plan and is following her eating plan approximately 85 % of the time. She states she is walking 20 minutes 3 times per week. Delle's last visit in office was 3 months ago during the [place] isolation. She started skipping meals, increased snacking, and was less active. She is ready to get back on track.  Her weight is 260 lb (117.9 kg) today and has had a weight gain of 11 pounds over a period of 3 months since her last visit. She has lost 2 lbs since starting treatment with [name].  Vitamin D Deficiency Margaret Porter has a diagnosis of vitamin D deficiency. She is currently stable on vit D. Margaret Porter denies nausea, vomiting, or muscle weakness.  At risk for diabetes Margaret Porter is at higher than average risk for developing diabetes due to her obesity.   Depression with emotional eating behaviors Margaret Porter mood is low and she is frustrated with herself. She notes increased stress and boredom eating and increased fatigue. She is struggling with emotional eating and using food for comfort to the extent that it is negatively impacting her health. She often snacks when she is not hungry. Margaret Porter sometimes feels she is out of control and then feels guilty that she made poor food choices. She has been working on behavior modification techniques to help reduce her emotional eating and has been somewhat successful. She shows no sign of suicidal or homicidal ideations.  Depression screen [facility] 2/9 05/07/2019 01/20/2019 11/12/2018 01/20/2018 12/05/2016  Decreased Interest 0 0 1 0 0  Down, Depressed, Hopeless 0 0 1 0 0  PHQ - 2 Score 0 0 2 0 0  Altered sleeping - - 1 - -  Tired, decreased energy - - 1 - -  Change in appetite - - 0 - -  Feeling bad or failure about yourself  - - 0 - -  Trouble concentrating - - 0 - -  Moving slowly or  fidgety/restless - - 0 - -  Suicidal thoughts - - 0 - -  PHQ-9 Score - - 4 - -  Difficult doing work/chores - - Not difficult at all - -   ASSESSMENT AND PLAN:  Vitamin D deficiency  Other depression - with emotional eating - Plan: buPROPion (WELLBUTRIN SR) 150 MG 12 hr tablet  At risk for diabetes mellitus  Class 3 severe obesity with serious comorbidity and body mass index (BMI) of 45.0 to 49.9 in adult, unspecified obesity type (HCC)  PLAN:  Vitamin D Deficiency Margaret Porter was informed that low vitamin D levels contribute to fatigue and are associated with obesity, breast, and colon cancer. Margaret Porter agrees to continue to take prescription Vit D @50 ,000 IU every week #4 and will follow up for routine testing of vitamin D, at least 2-3 times per year. She was informed of the risk of over-replacement of vitamin D and agrees to not increase her dose unless she discusses this with [name] first. We will check labs at her next visit. Margaret Porter agrees to follow up in 2 weeks as directed.  Diabetes risk counseling Margaret Porter was given extended (15 minutes) diabetes prevention counseling today. She is 49 y.o. female and has risk factors for diabetes including obesity. We discussed intensive lifestyle modifications today with an emphasis on weight loss as well as increasing exercise and decreasing simple carbohydrates in  her diet.  Depression with Emotional Eating Behaviors We discussed behavior modification techniques today to help Margaret Porter deal with her emotional eating and depression. She has agreed to take Wellbutrin SR 150 mg qAM #30 with no refills and agreed to follow up as directed in 2 weeks.  Obesity Margaret Porter is currently in the action stage of change. As such, her goal is to continue with weight loss efforts. She has agreed to change to follow a lower carbohydrate, vegetable, and lean protein rich diet plan. Margaret Porter has been instructed to work up to a goal of 150 minutes of combined cardio  and strengthening exercise per week for weight loss and overall health benefits. We discussed the following Behavioral Modification Strategies today: increasing lean protein intake, decreasing simple carbohydrates, emotional eating strategies, and ways to avoid boredom eating.  Margaret Porter has agreed to follow up with our clinic in 2 weeks. She was informed of the importance of frequent follow up visits to maximize her success with intensive lifestyle modifications for her multiple health conditions.  ALLERGIES: Allergies  Allergen Reactions  . Molds & Smuts     SOB, chest congestion  . Oxycodone     Questionable mouth itching, sweats, but was also simultaneously on Cipro, Flagyl, and Robaxin.  [name] Kitchen Red Dye     Caused a rash with large amounts  . Amoxicillin Rash  . Aspirin Anxiety  . Sulfamethoxazole-Trimethoprim Rash    MEDICATIONS: Current Outpatient Medications on File Prior to Visit  Medication Sig Dispense Refill  . diphenhydrAMINE (BENADRYL) 25 MG tablet Take 25 mg by mouth every 6 (six) hours as needed for itching or allergies. Reported on 02/29/2016    . GENVOYA 150-150-200-10 MG TABS tablet TAKE ONE TABLET BY MOUTH ONCE DAILY WITH BREAKFAST. STORE IN ORIGINALCONTAINER AT ROOM TEMPERATURE. 30 tablet 5  . Vitamin D, Ergocalciferol, (DRISDOL) 1.25 MG (50000 UT) CAPS capsule Take 1 capsule (50,000 Units total) by mouth 2 (two) times a week. 24 capsule 1   No current facility-administered medications on file prior to visit.     PAST MEDICAL HISTORY: Past Medical History:  Diagnosis Date  . Allergic rhinitis   . Allergy   . Anemia   . Food allergy   . HIV infection ([place]) 2006  . Idiopathic urticaria   . Joint pain   . Lytic bone lesions on xray    bone lesions on hip,sternum and spine  . Obesity   . Pre-diabetes    pt not on meds  . Rosacea   . Sickle cell anemia (HCC)    Sickle cell trait with mom  . Swelling of both lower extremities    per pt, left side is more swollen   . Vitamin D deficiency     PAST SURGICAL HISTORY: Past Surgical History:  Procedure Laterality Date  . APPENDECTOMY    . DILATATION & CURRETTAGE/HYSTEROSCOPY WITH RESECTOCOPE N/A 06/27/2015   Procedure: DILATATION & CURETTAGE/HYSTEROSCOPY WITH RESECTOCOPE;  Surgeon: Servando Salina, MD;  Location: [facility];  Service: Gynecology;  Laterality: N/A;  . LAPAROSCOPIC APPENDECTOMY N/A 09/07/2017   Procedure: APPENDECTOMY LAPAROSCOPIC;  Surgeon: Clovis Riley, MD;  Location: [facility];  Service: General;  Laterality: N/A;    SOCIAL HISTORY: Social History   Tobacco Use  . Smoking status: Never Smoker  . Smokeless tobacco: Never Used  Substance Use Topics  . Alcohol use: Yes    Alcohol/week: 0.0 standard drinks    Comment: occasional (special occasions)  . Drug use: No    FAMILY  HISTORY: Family History  Problem Relation Age of Onset  . Diabetes Mother   . Diabetes Brother   . Heart disease Paternal Uncle   . Heart disease Maternal Grandmother   . Cancer Maternal Grandmother   . Heart disease Paternal Grandmother   . Diabetes Father   . High blood pressure Father   . Colon cancer Neg Hx   . Colon polyps Neg Hx   . Esophageal cancer Neg Hx   . Rectal cancer Neg Hx   . Stomach cancer Neg Hx     ROS: Review of Systems  Constitutional: Positive for malaise/fatigue.  Gastrointestinal: Negative for nausea and vomiting.  Musculoskeletal:       Negative for muscle weakness.  Psychiatric/Behavioral: Positive for depression.    PHYSICAL EXAM: Blood pressure 119/81, pulse 71, temperature 98.1 F (36.7 C), temperature source Oral, height 5\' 3"  (1.6 m), weight 260 lb (117.9 kg), last menstrual period 05/23/2019, SpO2 97 %. Body mass index is 46.06 kg/m. Physical Exam Vitals signs reviewed.  Constitutional:      Appearance: Normal appearance. She is obese.  Cardiovascular:     Rate and Rhythm: Normal rate.  Pulmonary:     Effort: Pulmonary effort is normal.  Musculoskeletal:  Normal range of motion.  Skin:    General: Skin is warm and dry.  Neurological:     Mental Status: She is alert and oriented to person, place, and time.  Psychiatric:        Mood and Affect: Mood normal.        Behavior: Behavior normal.     RECENT LABS AND TESTS: BMET    Component Value Date/Time   NA 139 04/03/2019 0913   K 4.5 04/03/2019 0913   CL 105 04/03/2019 0913   CO2 27 04/03/2019 0913   GLUCOSE 94 04/03/2019 0913   BUN 11 04/03/2019 0913   CREATININE 0.94 04/03/2019 0913   CALCIUM 9.6 04/09/2019 1534   GFRNONAA 71 04/03/2019 0913   GFRAA 82 04/03/2019 0913   Lab Results  Component Value Date   HGBA1C 5.3 12/02/2018   HGBA1C 5.4 11/12/2018   HGBA1C 5.3 02/14/2018   HGBA1C 5.1 04/04/2017   HGBA1C 5.7 (H) 02/13/2016   Lab Results  Component Value Date   INSULIN 21.1 11/12/2018   CBC    Component Value Date/Time   WBC 4.8 04/03/2019 0913   RBC 4.30 04/03/2019 0913   HGB 13.4 04/03/2019 0913   HGB 14.1 11/12/2018 0938   HCT 40.3 04/03/2019 0913   HCT 42.9 11/12/2018 0938   PLT 249 04/03/2019 0913   PLT 257 11/12/2018 0938   MCV 93.7 04/03/2019 0913   MCV 94 11/12/2018 0938   MCH 31.2 04/03/2019 0913   MCHC 33.3 04/03/2019 0913   RDW 13.1 04/03/2019 0913   RDW 13.2 11/12/2018 0938   LYMPHSABS 1,306 04/03/2019 0913   LYMPHSABS 0.9 11/12/2018 0938   MONOABS 0.4 03/05/2019 1235   EOSABS 38 04/03/2019 0913   EOSABS 0.0 11/12/2018 0938   BASOSABS 19 04/03/2019 0913   BASOSABS 0.0 11/12/2018 0938   Iron/TIBC/Ferritin/ %Sat No results found for: IRON, TIBC, FERRITIN, IRONPCTSAT Lipid Panel     Component Value Date/Time   CHOL 149 12/02/2018 0810   TRIG 95 12/02/2018 0810   HDL 34 (L) 12/02/2018 0810   CHOLHDL 4.4 12/02/2018 0810   VLDL 24 04/04/2017 0935   LDLCALC 96 12/02/2018 0810   Hepatic Function Panel     Component Value Date/Time   PROT  5.9 (L) 04/03/2019 0913   PROT 6.0 02/03/2019 0920   ALBUMIN 3.9 03/05/2019 1235   AST 13  04/03/2019 0913   AST 15 03/05/2019 1235   ALT 12 04/03/2019 0913   ALT 18 03/05/2019 1235   ALKPHOS 128 (H) 03/05/2019 1235   BILITOT 0.3 04/03/2019 0913   BILITOT 0.4 03/05/2019 1235   BILIDIR 0.1 12/22/2015 0001   IBILI 0.3 12/22/2015 0001      Component Value Date/Time   TSH 2.421 04/09/2019 1534   TSH 2.170 11/12/2018 0938   TSH 1.985 11/29/2014 1029   Results for ERDINE, HULEN (MRN [id number]) as of 06/22/2019 15:31  Ref. Range 03/05/2019 12:35  Vitamin D, 25-Hydroxy Latest Ref Range: 30.0 - 100.0 ng/mL 34.8     OBESITY BEHAVIORAL INTERVENTION VISIT  Today's visit was # 15   Starting weight: 262 lbs Starting date: 11/12/18 Today's weight : Weight: 260 lb (117.9 kg)  Today's date: 06/15/2019 Total lbs lost to date: 2    06/15/2019  Height 5\' 3"  (1.6 m)  Weight 260 lb (117.9 kg)  BMI (Calculated) 46.07  BLOOD PRESSURE - SYSTOLIC 672  BLOOD PRESSURE - DIASTOLIC 81   Body Fat % 09.4 %  Total Body Water (lbs) 90.2 lbs    ASK: We discussed the diagnosis of obesity with Judieth Keens today and Deshayla agreed to give [name] permission to discuss obesity behavioral modification therapy today.  ASSESS: Kerrilyn has the diagnosis of obesity and her BMI today is 46.0. Ryan is in the action stage of change.   ADVISE: Keisi was educated on the multiple health risks of obesity as well as the benefit of weight loss to improve her health. She was advised of the need for long term treatment and the importance of lifestyle modifications to improve her current health and to decrease her risk of future health problems.  AGREE: Multiple dietary modification options and treatment options were discussed and Margaret Porter agreed to follow the recommendations documented in the above note.  ARRANGE: Margaret Porter was educated on the importance of frequent visits to treat obesity as outlined per CMS and USPSTF guidelines and agreed to schedule her next follow up appointment today.   IMarcille Blanco, CMA, am acting as transcriptionist for Starlyn Skeans, MD I have reviewed the above documentation for accuracy and completeness, and I agree with the above. -Dennard Nip, MD

## 2019-06-29 ENCOUNTER — Ambulatory Visit (INDEPENDENT_AMBULATORY_CARE_PROVIDER_SITE_OTHER): Payer: BC Managed Care – PPO | Admitting: Family Medicine

## 2019-06-29 ENCOUNTER — Other Ambulatory Visit: Payer: Self-pay

## 2019-06-29 ENCOUNTER — Encounter (INDEPENDENT_AMBULATORY_CARE_PROVIDER_SITE_OTHER): Payer: Self-pay | Admitting: Family Medicine

## 2019-06-29 VITALS — BP 141/84 | HR 66 | Temp 98.4°F | Ht 63.0 in | Wt 256.0 lb

## 2019-06-29 DIAGNOSIS — Z9189 Other specified personal risk factors, not elsewhere classified: Secondary | ICD-10-CM | POA: Diagnosis not present

## 2019-06-29 DIAGNOSIS — F3289 Other specified depressive episodes: Secondary | ICD-10-CM

## 2019-06-29 DIAGNOSIS — E559 Vitamin D deficiency, unspecified: Secondary | ICD-10-CM | POA: Diagnosis not present

## 2019-06-29 DIAGNOSIS — Z6841 Body Mass Index (BMI) 40.0 and over, adult: Secondary | ICD-10-CM

## 2019-06-29 DIAGNOSIS — E8881 Metabolic syndrome: Secondary | ICD-10-CM | POA: Diagnosis not present

## 2019-06-30 ENCOUNTER — Encounter (INDEPENDENT_AMBULATORY_CARE_PROVIDER_SITE_OTHER): Payer: Self-pay | Admitting: Family Medicine

## 2019-06-30 LAB — INSULIN, RANDOM: INSULIN: 18.2 u[IU]/mL (ref 2.6–24.9)

## 2019-06-30 LAB — VITAMIN D 25 HYDROXY (VIT D DEFICIENCY, FRACTURES): Vit D, 25-Hydroxy: 48.6 ng/mL (ref 30.0–100.0)

## 2019-06-30 LAB — HEMOGLOBIN A1C
Est. average glucose Bld gHb Est-mCnc: 97 mg/dL
Hgb A1c MFr Bld: 5 % (ref 4.8–5.6)

## 2019-06-30 NOTE — Progress Notes (Signed)
Office: [phone]  /  Fax: [phone]   HPI:   Chief Complaint: OBESITY Margaret Porter is here to discuss her progress with her obesity treatment plan. She is following a lower carbohydrate diet plan and is following her eating plan approximately 90% of the time. She states she is walking 45 minutes 5 times per week. Margaret Porter has been following the low carb plan and wants to continue it for now. She does miss eating fruit. Her weight is 256 lb (116.1 kg) today and has had a weight loss of 4 pounds over a period of 2 weeks since her last visit. She has lost 6 lbs since starting treatment with [name].  Vitamin D deficiency Margaret Porter has a diagnosis of Vitamin D deficiency. Her last Vitamin D level was reported to be 34.8 on 03/05/2019. She is currently taking prescription Vit D twice weekly per her Hem/Onc. Her Hem/Onc recommends she have a Vitamin D level between 60 and 90. She denies nausea, vomiting or muscle weakness.  Insulin Resistance Margaret Porter has a diagnosis of insulin resistance based on her elevated fasting insulin level >5. Although Margaret Porter's blood glucose readings are still under good control, insulin resistance puts her at greater risk of metabolic syndrome and diabetes. She is not taking metformin currently and continues to work on diet and exercise to decrease risk of diabetes. Lab Results  Component Value Date   HGBA1C 5.0 06/29/2019    At risk for diabetes Margaret Porter is at higher than averagerisk for developing diabetes due to her obesity. She currently denies polyuria or polydipsia.  Depression with emotional eating behaviors Margaret Porter is struggling with emotional eating and using food for comfort to the extent that it is negatively impacting her health. She often snacks when she is not hungry. Margaret Porter sometimes feels she is out of control and then feels guilty that she made poor food choices. She has been working on behavior modification techniques to help reduce her emotional  eating and has been somewhat successful. Margaret Porter craving for fruit and sweets since she is on the low carb plan. She was started on bupropion at her last visit but is unsure about whether she wants to take it or not. She shows no sign of suicidal or homicidal ideations.  Depression screen Margaret Porter 2/9 05/07/2019 01/20/2019 11/12/2018 01/20/2018 12/05/2016  Decreased Interest 0 0 1 0 0  Down, Depressed, Hopeless 0 0 1 0 0  PHQ - 2 Score 0 0 2 0 0  Altered sleeping - - 1 - -  Tired, decreased energy - - 1 - -  Change in appetite - - 0 - -  Feeling bad or failure about yourself  - - 0 - -  Trouble concentrating - - 0 - -  Moving slowly or fidgety/restless - - 0 - -  Suicidal thoughts - - 0 - -  PHQ-9 Score - - 4 - -  Difficult doing work/chores - - Not difficult at all - -   ASSESSMENT AND PLAN:  Vitamin D deficiency - Plan: VITAMIN D 25 Hydroxy (Vit-D Deficiency, Fractures)  Insulin resistance - Plan: Hemoglobin A1c, Insulin, random  Other depression  Class 3 severe obesity with serious comorbidity and body mass index (BMI) of 45.0 to 49.9 in adult, unspecified obesity type (HCC)  PLAN:  Vitamin D Deficiency Margaret Porter was informed that low Vitamin D levels contributes to fatigue and are associated with obesity, breast, and colon cancer. She agrees to continue taking prescription Vit D and will have routine testing of  Vitamin D today. She was informed of the risk of over-replacement of Vitamin D and agrees to not increase her dose unless she discusses this with [name] first. Margaret Porter agrees to follow-up with our clinic in 3 weeks.  Insulin Resistance Margaret Porter will continue to work on weight loss, exercise, and decreasing simple carbohydrates in her diet to help decrease the risk of diabetes. We dicussed metformin including benefits and risks. She was informed that eating too many simple carbohydrates or too many calories at one sitting increases the likelihood of GI side effects. Margaret Porter will  have A1c and fasting glucose and will follow-up with [name] as directed to monitor her progress.  Diabetes risk counselling Margaret Porter was given extended (15 minutes) diabetes prevention counseling today. She is 49 y.o. female and has risk factors for diabetes including obesity. We discussed intensive lifestyle modifications today with an emphasis on weight loss as well as increasing exercise and decreasing simple carbohydrates in her diet.   Depression with Emotional Eating Behaviors We discussed behavior modification techniques today to help Margaret Porter deal with her emotional eating and depression. Margaret Porter will continue to think about starting bupropion.  Obesity Margaret Porter is currently in the action stage of change. As such, her goal is to continue with weight loss efforts. She has agreed to follow a lower carbohydrate, vegetable and lean protein rich diet plan. Margaret Porter has been instructed to continue her current exercise regimen for weight loss and overall health benefits. We discussed the following Behavioral Modification Strategies today: decreasing simple carbohydrates and planning for success.  Margaret Porter has agreed to follow-up with our clinic in 3 weeks. She was informed of the importance of frequent follow-up visits to maximize her success with intensive lifestyle modifications for her multiple health conditions.  ALLERGIES: Allergies  Allergen Reactions  . Molds & Smuts     SOB, chest congestion  . Oxycodone     Questionable mouth itching, sweats, but was also simultaneously on Cipro, Flagyl, and Robaxin.  [name] Kitchen Red Dye     Caused a rash with large amounts  . Amoxicillin Rash  . Aspirin Anxiety  . Sulfamethoxazole-Trimethoprim Rash    MEDICATIONS: Current Outpatient Medications on File Prior to Visit  Medication Sig Dispense Refill  . buPROPion (WELLBUTRIN SR) 150 MG 12 hr tablet Take 1 tablet (150 mg total) by mouth daily. 30 tablet 0  . diphenhydrAMINE (BENADRYL) 25 MG tablet Take  25 mg by mouth every 6 (six) hours as needed for itching or allergies. Reported on 02/29/2016    . GENVOYA 150-150-200-10 MG TABS tablet TAKE ONE TABLET BY MOUTH ONCE DAILY WITH BREAKFAST. STORE IN ORIGINALCONTAINER AT ROOM TEMPERATURE. 30 tablet 5  . Vitamin D, Ergocalciferol, (DRISDOL) 1.25 MG (50000 UT) CAPS capsule Take 1 capsule (50,000 Units total) by mouth 2 (two) times a week. 24 capsule 1   No current facility-administered medications on file prior to visit.     PAST MEDICAL HISTORY: Past Medical History:  Diagnosis Date  . Allergic rhinitis   . Allergy   . Anemia   . Food allergy   . HIV infection ([place]) 2006  . Idiopathic urticaria   . Joint pain   . Lytic bone lesions on xray    bone lesions on hip,sternum and spine  . Obesity   . Pre-diabetes    pt not on meds  . Rosacea   . Sickle cell anemia (HCC)    Sickle cell trait with mom  . Swelling of both lower extremities    per  pt, left side is more swollen  . Vitamin D deficiency     PAST SURGICAL HISTORY: Past Surgical History:  Procedure Laterality Date  . APPENDECTOMY    . DILATATION & CURRETTAGE/HYSTEROSCOPY WITH RESECTOCOPE N/A 06/27/2015   Procedure: DILATATION & CURETTAGE/HYSTEROSCOPY WITH RESECTOCOPE;  Surgeon: Servando Salina, MD;  Location: [facility];  Service: Gynecology;  Laterality: N/A;  . LAPAROSCOPIC APPENDECTOMY N/A 09/07/2017   Procedure: APPENDECTOMY LAPAROSCOPIC;  Surgeon: Clovis Riley, MD;  Location: [facility];  Service: General;  Laterality: N/A;    SOCIAL HISTORY: Social History   Tobacco Use  . Smoking status: Never Smoker  . Smokeless tobacco: Never Used  Substance Use Topics  . Alcohol use: Yes    Alcohol/week: 0.0 standard drinks    Comment: occasional (special occasions)  . Drug use: No    FAMILY HISTORY: Family History  Problem Relation Age of Onset  . Diabetes Mother   . Diabetes Brother   . Heart disease Paternal Uncle   . Heart disease Maternal Grandmother   . Cancer  Maternal Grandmother   . Heart disease Paternal Grandmother   . Diabetes Father   . High blood pressure Father   . Colon cancer Neg Hx   . Colon polyps Neg Hx   . Esophageal cancer Neg Hx   . Rectal cancer Neg Hx   . Stomach cancer Neg Hx    ROS: Review of Systems  Gastrointestinal: Negative for nausea and vomiting.  Musculoskeletal:       Negative for muscle weakness.  Psychiatric/Behavioral: Positive for depression. Negative for suicidal ideas.       Negative for homicidal ideas.   PHYSICAL EXAM: Blood pressure (!) 141/84, pulse 66, temperature 98.4 F (36.9 C), height 5\' 3"  (1.6 m), weight 256 lb (116.1 kg), SpO2 98 %. Body mass index is 45.35 kg/m. Physical Exam Vitals signs reviewed.  Constitutional:      Appearance: Normal appearance. She is obese.  Cardiovascular:     Rate and Rhythm: Normal rate.     Pulses: Normal pulses.  Pulmonary:     Effort: Pulmonary effort is normal.     Breath sounds: Normal breath sounds.  Musculoskeletal: Normal range of motion.  Skin:    General: Skin is warm and dry.  Neurological:     Mental Status: She is alert and oriented to person, place, and time.  Psychiatric:        Behavior: Behavior normal.   RECENT LABS AND TESTS: BMET    Component Value Date/Time   NA 139 04/03/2019 0913   K 4.5 04/03/2019 0913   CL 105 04/03/2019 0913   CO2 27 04/03/2019 0913   GLUCOSE 94 04/03/2019 0913   BUN 11 04/03/2019 0913   CREATININE 0.94 04/03/2019 0913   CALCIUM 9.6 04/09/2019 1534   GFRNONAA 71 04/03/2019 0913   GFRAA 82 04/03/2019 0913   Lab Results  Component Value Date   HGBA1C 5.0 06/29/2019   HGBA1C 5.3 12/02/2018   HGBA1C 5.4 11/12/2018   HGBA1C 5.3 02/14/2018   HGBA1C 5.1 04/04/2017   Lab Results  Component Value Date   INSULIN WILL FOLLOW 06/29/2019   INSULIN 21.1 11/12/2018   CBC    Component Value Date/Time   WBC 4.8 04/03/2019 0913   RBC 4.30 04/03/2019 0913   HGB 13.4 04/03/2019 0913   HGB 14.1  11/12/2018 0938   HCT 40.3 04/03/2019 0913   HCT 42.9 11/12/2018 0938   PLT 249 04/03/2019 0913   PLT 257  11/12/2018 0938   MCV 93.7 04/03/2019 0913   MCV 94 11/12/2018 0938   MCH 31.2 04/03/2019 0913   MCHC 33.3 04/03/2019 0913   RDW 13.1 04/03/2019 0913   RDW 13.2 11/12/2018 0938   LYMPHSABS 1,306 04/03/2019 0913   LYMPHSABS 0.9 11/12/2018 0938   MONOABS 0.4 03/05/2019 1235   EOSABS 38 04/03/2019 0913   EOSABS 0.0 11/12/2018 0938   BASOSABS 19 04/03/2019 0913   BASOSABS 0.0 11/12/2018 0938   Iron/TIBC/Ferritin/ %Sat No results found for: IRON, TIBC, FERRITIN, IRONPCTSAT Lipid Panel     Component Value Date/Time   CHOL 149 12/02/2018 0810   TRIG 95 12/02/2018 0810   HDL 34 (L) 12/02/2018 0810   CHOLHDL 4.4 12/02/2018 0810   VLDL 24 04/04/2017 0935   LDLCALC 96 12/02/2018 0810   Hepatic Function Panel     Component Value Date/Time   PROT 5.9 (L) 04/03/2019 0913   PROT 6.0 02/03/2019 0920   ALBUMIN 3.9 03/05/2019 1235   AST 13 04/03/2019 0913   AST 15 03/05/2019 1235   ALT 12 04/03/2019 0913   ALT 18 03/05/2019 1235   ALKPHOS 128 (H) 03/05/2019 1235   BILITOT 0.3 04/03/2019 0913   BILITOT 0.4 03/05/2019 1235   BILIDIR 0.1 12/22/2015 0001   IBILI 0.3 12/22/2015 0001      Component Value Date/Time   TSH 2.421 04/09/2019 1534   TSH 2.170 11/12/2018 0938   TSH 1.985 11/29/2014 1029   Results for Margaret, Porter (MRN [id number]) as of 06/30/2019 08:15  Ref. Range 03/05/2019 12:35  Vitamin D, 25-Hydroxy Latest Ref Range: 30.0 - 100.0 ng/mL 34.8   OBESITY BEHAVIORAL INTERVENTION VISIT  Today's visit was #16  Starting weight: 262 lbs Starting date: 11/12/2018 Today's weight: 256 lbs  Today's date: 06/29/2019 Total lbs lost to date: 6    06/29/2019  Height 5\' 3"  (1.6 m)  Weight 256 lb (116.1 kg)  BMI (Calculated) 45.36  BLOOD PRESSURE - SYSTOLIC 201  BLOOD PRESSURE - DIASTOLIC 84   Body Fat % 00.7 %  Total Body Water (lbs) 89.21 lbs   ASK: We discussed  the diagnosis of obesity with Margaret Porter today and Margaret Porter agreed to give [name] permission to discuss obesity behavioral modification therapy today.  ASSESS: Margaret Porter has the diagnosis of obesity and her BMI today is 45.5. Margaret Porter is in the action stage of change.  ADVISE: Margaret Porter was educated on the multiple health risks of obesity as well as the benefit of weight loss to improve her health. She was advised of the need for long term treatment and the importance of lifestyle modifications to improve her current health and to decrease her risk of future health problems.  AGREE: Multiple dietary modification options and treatment options were discussed and  Carianna agreed to follow the recommendations documented in the above note.  ARRANGE: [place] was educated on the importance of frequent visits to treat obesity as outlined per CMS and USPSTF guidelines and agreed to schedule her next follow up appointment today.  IMichaelene Song, am acting as Location manager for [employer], FNP  I have reviewed the above documentation for accuracy and completeness, and I agree with the above.  - Masaji Billups, FNP-C.

## 2019-07-05 ENCOUNTER — Other Ambulatory Visit (INDEPENDENT_AMBULATORY_CARE_PROVIDER_SITE_OTHER): Payer: Self-pay | Admitting: Family Medicine

## 2019-07-05 DIAGNOSIS — F3289 Other specified depressive episodes: Secondary | ICD-10-CM

## 2019-07-15 ENCOUNTER — Ambulatory Visit (INDEPENDENT_AMBULATORY_CARE_PROVIDER_SITE_OTHER): Payer: BC Managed Care – PPO | Admitting: Bariatrics

## 2019-07-15 ENCOUNTER — Other Ambulatory Visit: Payer: Self-pay

## 2019-07-15 ENCOUNTER — Encounter (INDEPENDENT_AMBULATORY_CARE_PROVIDER_SITE_OTHER): Payer: Self-pay | Admitting: Bariatrics

## 2019-07-15 VITALS — BP 109/62 | HR 76 | Temp 98.5°F | Ht 63.0 in | Wt 259.0 lb

## 2019-07-15 DIAGNOSIS — J302 Other seasonal allergic rhinitis: Secondary | ICD-10-CM

## 2019-07-15 DIAGNOSIS — Z9189 Other specified personal risk factors, not elsewhere classified: Secondary | ICD-10-CM

## 2019-07-15 DIAGNOSIS — Z6841 Body Mass Index (BMI) 40.0 and over, adult: Secondary | ICD-10-CM

## 2019-07-15 DIAGNOSIS — E8881 Metabolic syndrome: Secondary | ICD-10-CM

## 2019-07-15 DIAGNOSIS — E559 Vitamin D deficiency, unspecified: Secondary | ICD-10-CM | POA: Diagnosis not present

## 2019-07-15 DIAGNOSIS — F3289 Other specified depressive episodes: Secondary | ICD-10-CM | POA: Diagnosis not present

## 2019-07-15 NOTE — Progress Notes (Signed)
Office: [phone]  /  Fax: [phone]   HPI:   Chief Complaint: OBESITY Margaret Porter is here to discuss her progress with her obesity treatment plan. She is on a lower carbohydrate diet plan and is following her eating plan approximately 85% of the time. She states she is walking 60+ minutes 5 times per week. Margaret Porter was last seen by me on 02/04/2019 and has gained 3 lbs. She states she is doing well with her water intake. Her weight is 259 lb (117.5 kg) today and has had a weight gain of 3 lbs since her last visit. She has lost 3 lbs since starting treatment with [name].  Vitamin D deficiency Margaret Porter has a diagnosis of Vitamin D deficiency. Her last Vitamin D level was noted to be 48.6 on 06/29/2019. She is currently taking Vit D per her PCP and denies nausea, vomiting or muscle weakness.  Insulin Resistance Margaret Porter has a diagnosis of insulin resistance based on her elevated fasting insulin level >5. Her last A1c was 5.0 on 06/29/2019 and her insulin was 18.2. Although Margaret Porter's blood glucose readings are still under good control, insulin resistance puts her at greater risk of metabolic syndrome and diabetes. She is not taking metformin currently and continues to work on diet and exercise to decrease risk of diabetes.  At risk for diabetes Margaret Porter is at higher than average risk for developing diabetes due to her obesity. She currently denies polyuria or polydipsia.  Depression with emotional eating behaviors Margaret Porter is struggling with emotional eating and using food for comfort to the extent that it is negatively impacting her health. She often snacks when she is not hungry. Margaret Porter sometimes feels she is out of control and then feels guilty that she made poor food choices. She has been working on behavior modification techniques to help reduce her emotional eating and has been somewhat successful. Margaret Porter states she is considering whether to take Wellbutrin or not. She shows no sign of  suicidal or homicidal ideations.  Depression screen [facility] 2/9 05/07/2019 01/20/2019 11/12/2018 01/20/2018 12/05/2016  Decreased Interest 0 0 1 0 0  Down, Depressed, Hopeless 0 0 1 0 0  PHQ - 2 Score 0 0 2 0 0  Altered sleeping - - 1 - -  Tired, decreased energy - - 1 - -  Change in appetite - - 0 - -  Feeling bad or failure about yourself  - - 0 - -  Trouble concentrating - - 0 - -  Moving slowly or fidgety/restless - - 0 - -  Suicidal thoughts - - 0 - -  PHQ-9 Score - - 4 - -  Difficult doing work/chores - - Not difficult at all - -   Seasonal Allergies Margaret Porter reports having itchy eyes and nose due to seasonal allergies.   ASSESSMENT AND PLAN:  Vitamin D deficiency  Insulin resistance  Seasonal allergies  Other depression - with emotional eating   At risk for diabetes mellitus  Class 3 severe obesity with serious comorbidity and body mass index (BMI) of 45.0 to 49.9 in adult, unspecified obesity type ([place])  PLAN:  Vitamin D Deficiency Margaret Porter was informed that low Vitamin D levels contributes to fatigue and are associated with obesity, breast, and colon cancer. She agrees to continue taking Vit D per her PCP and will follow-up for routine testing of Vitamin D, at least 2-3 times per year. She was informed of the risk of over-replacement of Vitamin D and agrees to not increase her dose unless  she discusses this with [name] first. Margaret Porter agrees to follow-up with our clinic in 2 weeks.  Insulin Resistance Margaret Porter will continue to work on weight loss, exercise, and decreasing simple carbohydrates in her diet to help decrease the risk of diabetes. We dicussed metformin including benefits and risks. She was informed that eating too many simple carbohydrates or too many calories at one sitting increases the likelihood of GI side effects. Margaret Porter will decrease her carbohydrates, increase protein, and continue her current exercise regimen. She will follow-up with [name] as directed to monitor  her progress.  Diabetes risk counseling Margaret Porter was given extended (15 minutes) diabetes prevention counseling today. She is 49 y.o. female and has risk factors for diabetes including obesity. We discussed intensive lifestyle modifications today with an emphasis on weight loss as well as increasing exercise and decreasing simple carbohydrates in her diet.  Depression with Emotional Eating Behaviors We discussed behavior modification techniques today to help Dejia deal with her emotional eating and depression. Margaret Porter will decide if she wants to take the Wellbutrin and will let us know.  Seasonal Allergies Margaret Porter will begin Zyrtec or Claritin, use Flonase nasal spray if needed, and OTC Pataday allergy eye drops.  Obesity Margaret Porter is currently in the action stage of change. As such, her goal is to continue with weight loss efforts. She has agreed to follow a lower carbohydrate, vegetable and lean protein rich diet plan. Margaret Porter will work on meal planning, intentional eating, increasing her water intake, eating 3 meals a day, and will set a timer. Jasmane has been instructed to work up to a goal of 150 minutes of combined cardio and strengthening exercise per week for weight loss and overall health benefits. We discussed the following Behavioral Modification Strategies today: increasing lean protein intake, decreasing simple carbohydrates, increasing vegetables, increase H20 intake, decrease eating out, no skipping meals, work on meal planning and easy cooking plans, and keeping healthy foods in the home.  Margaret Porter has agreed to follow-up with our clinic in 2 weeks. She was informed of the importance of frequent follow-up visits to maximize her success with intensive lifestyle modifications for her multiple health conditions.  ALLERGIES: Allergies  Allergen Reactions   Molds & Smuts     SOB, chest congestion   Oxycodone     Questionable mouth itching, sweats, but was also  simultaneously on Cipro, Flagyl, and Robaxin.   Red Dye     Caused a rash with large amounts   Amoxicillin Rash   Aspirin Anxiety   Sulfamethoxazole-Trimethoprim Rash    MEDICATIONS: Current Outpatient Medications on File Prior to Visit  Medication Sig Dispense Refill   buPROPion (WELLBUTRIN SR) 150 MG 12 hr tablet Take 1 tablet (150 mg total) by mouth daily. 30 tablet 0   diphenhydrAMINE (BENADRYL) 25 MG tablet Take 25 mg by mouth every 6 (six) hours as needed for itching or allergies. Reported on 02/29/2016     GENVOYA 150-150-200-10 MG TABS tablet TAKE ONE TABLET BY MOUTH ONCE DAILY WITH BREAKFAST. STORE IN ORIGINALCONTAINER AT ROOM TEMPERATURE. 30 tablet 5   Vitamin D, Ergocalciferol, (DRISDOL) 1.25 MG (50000 UT) CAPS capsule Take 1 capsule (50,000 Units total) by mouth 2 (two) times a week. 24 capsule 1   No current facility-administered medications on file prior to visit.     PAST MEDICAL HISTORY: Past Medical History:  Diagnosis Date   Allergic rhinitis    Allergy    Anemia    Food allergy    HIV infection ([place])  2006   Idiopathic urticaria    Joint pain    Lytic bone lesions on xray    bone lesions on hip,sternum and spine   Obesity    Pre-diabetes    pt not on meds   Rosacea    Sickle cell anemia (H[facility])    Sickle cell trait with mom   Swelling of both lower extremities    per pt, left side is more swollen   Vitamin D deficiency     PAST SURGICAL HISTORY: Past Surgical History:  Procedure Laterality Date   APPENDECTOMY     DILATATION & CURRETTAGE/HYSTEROSCOPY WITH RESECTOCOPE N/A 06/27/2015   Procedure: DILATATION & CURETTAGE/HYSTEROSCOPY WITH RESECTOCOPE;  Surgeon: Servando Salina, MD;  Location: [facility];  Service: Gynecology;  Laterality: N/A;   LAPAROSCOPIC APPENDECTOMY N/A 09/07/2017   Procedure: APPENDECTOMY LAPAROSCOPIC;  Surgeon: Clovis Riley, MD;  Location: [facility];  Service: General;  Laterality: N/A;    SOCIAL  HISTORY: Social History   Tobacco Use   Smoking status: Never Smoker   Smokeless tobacco: Never Used  Substance Use Topics   Alcohol use: Yes    Alcohol/week: 0.0 standard drinks    Comment: occasional (special occasions)   Drug use: No    FAMILY HISTORY: Family History  Problem Relation Age of Onset   Diabetes Mother    Diabetes Brother    Heart disease Paternal Uncle    Heart disease Maternal Grandmother    Cancer Maternal Grandmother    Heart disease Paternal Grandmother    Diabetes Father    High blood pressure Father    Colon cancer Neg Hx    Colon polyps Neg Hx    Esophageal cancer Neg Hx    Rectal cancer Neg Hx    Stomach cancer Neg Hx    ROS: Review of Systems  Gastrointestinal: Negative for nausea and vomiting.  Musculoskeletal:       Negative for muscle weakness.  Endo/Heme/Allergies: Positive for environmental allergies (seasonal).  Psychiatric/Behavioral: Positive for depression. Negative for suicidal ideas.       Negative for homicidal ideas.   PHYSICAL EXAM: Blood pressure 109/62, pulse 76, temperature 98.5 F (36.9 C), temperature source Oral, height 5\' 3"  (1.6 m), weight 259 lb (117.5 kg), SpO2 98 %. Body mass index is 45.88 kg/m. Physical Exam Vitals signs reviewed.  Constitutional:      Appearance: Normal appearance. She is obese.  Cardiovascular:     Rate and Rhythm: Normal rate.     Pulses: Normal pulses.  Pulmonary:     Effort: Pulmonary effort is normal.     Breath sounds: Normal breath sounds.  Musculoskeletal: Normal range of motion.  Skin:    General: Skin is warm and dry.  Neurological:     Mental Status: She is alert and oriented to person, place, and time.  Psychiatric:        Behavior: Behavior normal.   RECENT LABS AND TESTS: BMET    Component Value Date/Time   NA 139 04/03/2019 0913   K 4.5 04/03/2019 0913   CL 105 04/03/2019 0913   CO2 27 04/03/2019 0913   GLUCOSE 94 04/03/2019 0913   BUN 11  04/03/2019 0913   CREATININE 0.94 04/03/2019 0913   CALCIUM 9.6 04/09/2019 1534   GFRNONAA 71 04/03/2019 0913   GFRAA 82 04/03/2019 0913   Lab Results  Component Value Date   HGBA1C 5.0 06/29/2019   HGBA1C 5.3 12/02/2018   HGBA1C 5.4 11/12/2018   HGBA1C 5.3 02/14/2018  HGBA1C 5.1 04/04/2017   Lab Results  Component Value Date   INSULIN 18.2 06/29/2019   INSULIN 21.1 11/12/2018   CBC    Component Value Date/Time   WBC 4.8 04/03/2019 0913   RBC 4.30 04/03/2019 0913   HGB 13.4 04/03/2019 0913   HGB 14.1 11/12/2018 0938   HCT 40.3 04/03/2019 0913   HCT 42.9 11/12/2018 0938   PLT 249 04/03/2019 0913   PLT 257 11/12/2018 0938   MCV 93.7 04/03/2019 0913   MCV 94 11/12/2018 0938   MCH 31.2 04/03/2019 0913   MCHC 33.3 04/03/2019 0913   RDW 13.1 04/03/2019 0913   RDW 13.2 11/12/2018 0938   LYMPHSABS 1,306 04/03/2019 0913   LYMPHSABS 0.9 11/12/2018 0938   MONOABS 0.4 03/05/2019 1235   EOSABS 38 04/03/2019 0913   EOSABS 0.0 11/12/2018 0938   BASOSABS 19 04/03/2019 0913   BASOSABS 0.0 11/12/2018 0938   Iron/TIBC/Ferritin/ %Sat No results found for: IRON, TIBC, FERRITIN, IRONPCTSAT Lipid Panel     Component Value Date/Time   CHOL 149 12/02/2018 0810   TRIG 95 12/02/2018 0810   HDL 34 (L) 12/02/2018 0810   CHOLHDL 4.4 12/02/2018 0810   VLDL 24 04/04/2017 0935   LDLCALC 96 12/02/2018 0810   Hepatic Function Panel     Component Value Date/Time   PROT 5.9 (L) 04/03/2019 0913   PROT 6.0 02/03/2019 0920   ALBUMIN 3.9 03/05/2019 1235   AST 13 04/03/2019 0913   AST 15 03/05/2019 1235   ALT 12 04/03/2019 0913   ALT 18 03/05/2019 1235   ALKPHOS 128 (H) 03/05/2019 1235   BILITOT 0.3 04/03/2019 0913   BILITOT 0.4 03/05/2019 1235   BILIDIR 0.1 12/22/2015 0001   IBILI 0.3 12/22/2015 0001      Component Value Date/Time   TSH 2.421 04/09/2019 1534   TSH 2.170 11/12/2018 0938   TSH 1.985 11/29/2014 1029   Results for ORAH, SONNEN (MRN [id number]) as of 07/15/2019  14:06  Ref. Range 06/29/2019 12:47  Vitamin D, 25-Hydroxy Latest Ref Range: 30.0 - 100.0 ng/mL 48.6   OBESITY BEHAVIORAL INTERVENTION VISIT  Today's visit was #17  Starting weight: 262 lbs Starting date: 11/12/2018 Today's weight: 259 lbs  Today's date: 07/15/2019 Total lbs lost to date: 3    07/15/2019  Height 5\' 3"  (1.6 m)  Weight 259 lb (117.5 kg)  BMI (Calculated) 45.89  BLOOD PRESSURE - SYSTOLIC 376  BLOOD PRESSURE - DIASTOLIC 62   Body Fat % 28.3 %  Total Body Water (lbs) 93.2 lbs   ASK: We discussed the diagnosis of obesity with Margaret Porter today and Margaret Porter agreed to give [name] permission to discuss obesity behavioral modification therapy today.  ASSESS: Margaret Porter has the diagnosis of obesity and her BMI today is 45.9. Margaret Porter is in the action stage of change.   ADVISE: Margaret Porter was educated on the multiple health risks of obesity as well as the benefit of weight loss to improve her health. She was advised of the need for long term treatment and the importance of lifestyle modifications to improve her current health and to decrease her risk of future health problems.  AGREE: Multiple dietary modification options and treatment options were discussed and  Margaret Porter agreed to follow the recommendations documented in the above note.  ARRANGE: Margaret Porter was educated on the importance of frequent visits to treat obesity as outlined per CMS and USPSTF guidelines and agreed to schedule her next follow up appointment today.  I, Michaelene Song,  am acting as transcriptionist for [employer], DO   I have reviewed the above documentation for accuracy and completeness, and I agree with the above. -Jearld Lesch, DO

## 2019-07-29 ENCOUNTER — Ambulatory Visit (INDEPENDENT_AMBULATORY_CARE_PROVIDER_SITE_OTHER): Payer: BC Managed Care – PPO | Admitting: Bariatrics

## 2019-07-29 ENCOUNTER — Encounter (INDEPENDENT_AMBULATORY_CARE_PROVIDER_SITE_OTHER): Payer: Self-pay | Admitting: Bariatrics

## 2019-07-29 ENCOUNTER — Other Ambulatory Visit: Payer: Self-pay

## 2019-07-29 VITALS — BP 122/91 | HR 60 | Temp 98.0°F | Ht 63.0 in | Wt 257.0 lb

## 2019-07-29 DIAGNOSIS — M722 Plantar fascial fibromatosis: Secondary | ICD-10-CM | POA: Diagnosis not present

## 2019-07-29 DIAGNOSIS — Z6841 Body Mass Index (BMI) 40.0 and over, adult: Secondary | ICD-10-CM

## 2019-07-29 DIAGNOSIS — E8881 Metabolic syndrome: Secondary | ICD-10-CM

## 2019-07-29 DIAGNOSIS — F3289 Other specified depressive episodes: Secondary | ICD-10-CM | POA: Diagnosis not present

## 2019-07-29 DIAGNOSIS — Z9189 Other specified personal risk factors, not elsewhere classified: Secondary | ICD-10-CM | POA: Diagnosis not present

## 2019-07-29 NOTE — Progress Notes (Signed)
Office: [phone]  /  Fax: [phone]   HPI:   Chief Complaint: OBESITY Margaret Porter is here to discuss her progress with her obesity treatment plan. She is on the lower carbohydrate, vegetable and lean protein rich diet plan and is following her eating plan approximately 90 % of the time. She states she is doing cardio and walking for 60 minutes 5 times per week. Margaret Porter is down 2 lbs. She sometimes skips or decrease her food. She is drinking adequate water.  Her weight is 257 lb (116.6 kg) today and has had a weight loss of 2 pounds over a period of 2 weeks since her last visit. She has lost 5 lbs since starting treatment with [name].  Insulin Resistance Margaret Porter has a diagnosis of insulin resistance based on her elevated fasting insulin level >5. Last A1c was 5.0 and insulin of 18.2. Although Margaret Porter's blood glucose readings are still under good control, insulin resistance puts her at greater risk of metabolic syndrome and diabetes. She is not taking metformin currently and continues to work on diet and exercise to decrease risk of diabetes. She denies polyphagia.  Plantar Fasciitis Margaret Porter complains of pain when she first starts to walk.  Depression with Emotional Eating Behaviors Margaret Porter was prescribed bupropion, but she has not taken it yet. Margaret Porter struggles with emotional eating and using food for comfort to the extent that it is negatively impacting her health. She often snacks when she is not hungry. Margaret Porter sometimes feels she is out of control and then feels guilty that she made poor food choices. She has been working on behavior modification techniques to help reduce her emotional eating and has been somewhat successful. She shows no sign of suicidal or homicidal ideations.  Depression screen Margaret Porter 2/9 05/07/2019 01/20/2019 11/12/2018 01/20/2018 12/05/2016  Decreased Interest 0 0 1 0 0  Down, Depressed, Hopeless 0 0 1 0 0  PHQ - 2 Score 0 0 2 0 0  Altered sleeping - - 1 - -   Tired, decreased energy - - 1 - -  Change in appetite - - 0 - -  Feeling bad or failure about yourself  - - 0 - -  Trouble concentrating - - 0 - -  Moving slowly or fidgety/restless - - 0 - -  Suicidal thoughts - - 0 - -  PHQ-9 Score - - 4 - -  Difficult doing work/chores - - Not difficult at all - -    At risk for osteopenia and osteoporosis Margaret Porter is at higher risk of osteopenia and osteoporosis due to vitamin D deficiency.   ASSESSMENT AND PLAN:  Insulin resistance  Other depression  Plantar fasciitis  At risk for osteoporosis  Class 3 severe obesity with serious comorbidity and body mass index (BMI) of 45.0 to 49.9 in adult, unspecified obesity type (Margaret Porter)  PLAN:  Insulin Resistance Margaret Porter will continue to work on weight loss, exercise, increase protein, and decreasing simple carbohydrates in her diet to help decrease the risk of diabetes. We dicussed metformin including benefits and risks. She was informed that eating too many simple carbohydrates or too many calories at one sitting increases the likelihood of GI side effects. Margaret Porter declined metformin for now and prescription was not written today. Margaret Porter agrees to follow up with our clinic in 2 weeks as directed to monitor her progress.  Plantar Fasciitis Margaret Porter is to take Margaret Porter during the day for only 5 days. She is to ice the area at night and do stretches. Margaret Porter  agrees to follow up with our clinic in 2 weeks.  Depression with Emotional Eating Behaviors We discussed behavior modification techniques today to help Margaret Porter deal with her emotional eating and depression. Margaret Porter will start Wellbutrin (has prescription at home). Margaret Porter agrees to follow up with our clinic in 2 weeks.  At risk for osteopenia and osteoporosis Margaret Porter was given extended (15 minutes) osteoporosis prevention counseling today. Margaret Porter is at risk for osteopenia and osteoporsis due to her vitamin D deficiency. She was encouraged to  take her vitamin D and follow her higher calcium diet and increase strengthening exercise to help strengthen her bones and decrease her risk of osteopenia and osteoporosis.  Obesity Margaret Porter is currently in the action stage of change. As such, her goal is to continue with weight loss efforts She has agreed to follow a lower carbohydrate, vegetable and lean protein rich diet plan Margaret Porter has been instructed to work up to a goal of 150 minutes of combined cardio and strengthening exercise per week for weight loss and overall health benefits. We discussed the following Behavioral Modification Strategies today: increasing lean protein intake, decreasing simple carbohydrates, increasing vegetables, increase H20 intake, decrease eating out, no skipping meals, work on meal planning and easy cooking plans, and keeping healthy foods in the home Margaret Porter will try to not skip meals, and increase protein.  Margaret Porter has agreed to follow up with our clinic in 2 weeks. She was informed of the importance of frequent follow up visits to maximize her success with intensive lifestyle modifications for her multiple health conditions.  ALLERGIES: Allergies  Allergen Reactions  . Molds & Smuts     SOB, chest congestion  . Oxycodone     Questionable mouth itching, sweats, but was also simultaneously on Cipro, Flagyl, and Robaxin.  [name] Kitchen Red Dye     Caused a rash with large amounts  . Amoxicillin Rash  . Aspirin Anxiety  . Sulfamethoxazole-Trimethoprim Rash    MEDICATIONS: Current Outpatient Medications on File Prior to Visit  Medication Sig Dispense Refill  . buPROPion (WELLBUTRIN SR) 150 MG 12 hr tablet Take 1 tablet (150 mg total) by mouth daily. 30 tablet 0  . diphenhydrAMINE (BENADRYL) 25 MG tablet Take 25 mg by mouth every 6 (six) hours as needed for itching or allergies. Reported on 02/29/2016    . GENVOYA 150-150-200-10 MG TABS tablet TAKE ONE TABLET BY MOUTH ONCE DAILY WITH BREAKFAST. STORE IN  ORIGINALCONTAINER AT ROOM TEMPERATURE. 30 tablet 5  . Vitamin D, Ergocalciferol, (DRISDOL) 1.25 MG (50000 UT) CAPS capsule Take 1 capsule (50,000 Units total) by mouth 2 (two) times a week. 24 capsule 1   No current facility-administered medications on file prior to visit.     PAST MEDICAL HISTORY: Past Medical History:  Diagnosis Date  . Allergic rhinitis   . Allergy   . Anemia   . Food allergy   . HIV infection ([place]) 2006  . Idiopathic urticaria   . Joint pain   . Lytic bone lesions on xray    bone lesions on hip,sternum and spine  . Obesity   . Pre-diabetes    pt not on meds  . Rosacea   . Sickle cell anemia (H[facility])    Sickle cell trait with mom  . Swelling of both lower extremities    per pt, left side is more swollen  . Vitamin D deficiency     PAST SURGICAL HISTORY: Past Surgical History:  Procedure Laterality Date  . APPENDECTOMY    .  DILATATION & CURRETTAGE/HYSTEROSCOPY WITH RESECTOCOPE N/A 06/27/2015   Procedure: DILATATION & CURETTAGE/HYSTEROSCOPY WITH RESECTOCOPE;  Surgeon: Servando Salina, MD;  Location: [facility];  Service: Gynecology;  Laterality: N/A;  . LAPAROSCOPIC APPENDECTOMY N/A 09/07/2017   Procedure: APPENDECTOMY LAPAROSCOPIC;  Surgeon: Clovis Riley, MD;  Location: [facility];  Service: General;  Laterality: N/A;    SOCIAL HISTORY: Social History   Tobacco Use  . Smoking status: Never Smoker  . Smokeless tobacco: Never Used  Substance Use Topics  . Alcohol use: Yes    Alcohol/week: 0.0 standard drinks    Comment: occasional (special occasions)  . Drug use: No    FAMILY HISTORY: Family History  Problem Relation Age of Onset  . Diabetes Mother   . Diabetes Brother   . Heart disease Paternal Uncle   . Heart disease Maternal Grandmother   . Cancer Maternal Grandmother   . Heart disease Paternal Grandmother   . Diabetes Father   . High blood pressure Father   . Colon cancer Neg Hx   . Colon polyps Neg Hx   . Esophageal cancer Neg Hx   .  Rectal cancer Neg Hx   . Stomach cancer Neg Hx     ROS: Review of Systems  Constitutional: Positive for weight loss.  Endo/Heme/Allergies:       Negative polyphagia  Psychiatric/Behavioral: Positive for depression. Negative for suicidal ideas.    PHYSICAL EXAM: Blood pressure (!) 122/91, pulse 60, temperature 98 F (36.7 C), temperature source Oral, height 5\' 3"  (1.6 m), weight 257 lb (116.6 kg), last menstrual period 07/28/2019, SpO2 96 %. Body mass index is 45.53 kg/m. Physical Exam Vitals signs reviewed.  Constitutional:      Appearance: Normal appearance. She is obese.  Cardiovascular:     Rate and Rhythm: Normal rate.     Pulses: Normal pulses.  Pulmonary:     Effort: Pulmonary effort is normal.     Breath sounds: Normal breath sounds.  Musculoskeletal: Normal range of motion.  Skin:    General: Skin is warm and dry.  Neurological:     Mental Status: She is alert and oriented to person, place, and time.  Psychiatric:        Mood and Affect: Mood normal.        Behavior: Behavior normal.     RECENT LABS AND TESTS: BMET    Component Value Date/Time   NA 139 04/03/2019 0913   K 4.5 04/03/2019 0913   CL 105 04/03/2019 0913   CO2 27 04/03/2019 0913   GLUCOSE 94 04/03/2019 0913   BUN 11 04/03/2019 0913   CREATININE 0.94 04/03/2019 0913   CALCIUM 9.6 04/09/2019 1534   GFRNONAA 71 04/03/2019 0913   GFRAA 82 04/03/2019 0913   Lab Results  Component Value Date   HGBA1C 5.0 06/29/2019   HGBA1C 5.3 12/02/2018   HGBA1C 5.4 11/12/2018   HGBA1C 5.3 02/14/2018   HGBA1C 5.1 04/04/2017   Lab Results  Component Value Date   INSULIN 18.2 06/29/2019   INSULIN 21.1 11/12/2018   CBC    Component Value Date/Time   WBC 4.8 04/03/2019 0913   RBC 4.30 04/03/2019 0913   HGB 13.4 04/03/2019 0913   HGB 14.1 11/12/2018 0938   HCT 40.3 04/03/2019 0913   HCT 42.9 11/12/2018 0938   PLT 249 04/03/2019 0913   PLT 257 11/12/2018 0938   MCV 93.7 04/03/2019 0913   MCV 94  11/12/2018 0938   MCH 31.2 04/03/2019 0913   MCHC  33.3 04/03/2019 0913   RDW 13.1 04/03/2019 0913   RDW 13.2 11/12/2018 0938   LYMPHSABS 1,306 04/03/2019 0913   LYMPHSABS 0.9 11/12/2018 0938   MONOABS 0.4 03/05/2019 1235   EOSABS 38 04/03/2019 0913   EOSABS 0.0 11/12/2018 0938   BASOSABS 19 04/03/2019 0913   BASOSABS 0.0 11/12/2018 0938   Iron/TIBC/Ferritin/ %Sat No results found for: IRON, TIBC, FERRITIN, IRONPCTSAT Lipid Panel     Component Value Date/Time   CHOL 149 12/02/2018 0810   TRIG 95 12/02/2018 0810   HDL 34 (L) 12/02/2018 0810   CHOLHDL 4.4 12/02/2018 0810   VLDL 24 04/04/2017 0935   LDLCALC 96 12/02/2018 0810   Hepatic Function Panel     Component Value Date/Time   PROT 5.9 (L) 04/03/2019 0913   PROT 6.0 02/03/2019 0920   ALBUMIN 3.9 03/05/2019 1235   AST 13 04/03/2019 0913   AST 15 03/05/2019 1235   ALT 12 04/03/2019 0913   ALT 18 03/05/2019 1235   ALKPHOS 128 (H) 03/05/2019 1235   BILITOT 0.3 04/03/2019 0913   BILITOT 0.4 03/05/2019 1235   BILIDIR 0.1 12/22/2015 0001   IBILI 0.3 12/22/2015 0001      Component Value Date/Time   TSH 2.421 04/09/2019 1534   TSH 2.170 11/12/2018 0938   TSH 1.985 11/29/2014 1029      OBESITY BEHAVIORAL INTERVENTION VISIT  Today's visit was # 18   Starting weight: 262 lbs Starting date: 11/12/18 Today's weight : 257 lbs Today's date: 07/29/2019 Total lbs lost to date: 5    ASK: We discussed the diagnosis of obesity with Judieth Keens today and Georgeanna agreed to give [name] permission to discuss obesity behavioral modification therapy today.  ASSESS: Margarine has the diagnosis of obesity and her BMI today is 45.54 Ninnie is in the action stage of change   ADVISE: Tiphanie was educated on the multiple health risks of obesity as well as the benefit of weight loss to improve her health. She was advised of the need for long term treatment and the importance of lifestyle modifications to improve her current  health and to decrease her risk of future health problems.  AGREE: Multiple dietary modification options and treatment options were discussed and  Mariette agreed to follow the recommendations documented in the above note.  ARRANGE: Mykenna was educated on the importance of frequent visits to treat obesity as outlined per CMS and USPSTF guidelines and agreed to schedule her next follow up appointment today.  Wilhemena Durie, am acting as transcriptionist for [employer], DO  I have reviewed the above documentation for accuracy and completeness, and I agree with the above. -Jearld Lesch, DO

## 2019-08-04 ENCOUNTER — Encounter (INDEPENDENT_AMBULATORY_CARE_PROVIDER_SITE_OTHER): Payer: BC Managed Care – PPO | Admitting: *Deleted

## 2019-08-04 ENCOUNTER — Other Ambulatory Visit: Payer: Self-pay

## 2019-08-04 VITALS — BP 110/76 | HR 71 | Temp 98.9°F | Wt 257.0 lb

## 2019-08-04 DIAGNOSIS — Z006 Encounter for examination for normal comparison and control in clinical research program: Secondary | ICD-10-CM

## 2019-08-04 NOTE — Research (Signed)
Margaret Porter was here for her week 336 visit for A5321. She said she tried wellbutrin yesterday for the first time and her eye swelled up 3 hours later, so she will not try it again. She denies any other problems. She has been diagnosed with insulin resistance and is trying weight management with Dr. Owens Shark.  She will; be seeing Dr. Megan Salon in November and we will see her prior to that visit for [id number] and get labs.

## 2019-08-04 NOTE — Research (Signed)
c-met  

## 2019-08-05 LAB — COMPREHENSIVE METABOLIC PANEL
AG Ratio: 2 (calc) (ref 1.0–2.5)
ALT: 20 U/L (ref 6–29)
AST: 17 U/L (ref 10–35)
Albumin: 4.1 g/dL (ref 3.6–5.1)
Alkaline phosphatase (APISO): 103 U/L (ref 37–153)
BUN: 16 mg/dL (ref 7–25)
CO2: 22 mmol/L (ref 20–32)
Calcium: 8.8 mg/dL (ref 8.6–10.4)
Chloride: 107 mmol/L (ref 98–110)
Creat: 0.96 mg/dL (ref 0.50–1.05)
Globulin: 2.1 g/dL (calc) (ref 1.9–3.7)
Glucose, Bld: 103 mg/dL — ABNORMAL HIGH (ref 65–99)
Potassium: 4.4 mmol/L (ref 3.5–5.3)
Sodium: 139 mmol/L (ref 135–146)
Total Bilirubin: 0.3 mg/dL (ref 0.2–1.2)
Total Protein: 6.2 g/dL (ref 6.1–8.1)

## 2019-08-05 LAB — HEPATITIS C ANTIBODY
Hepatitis C Ab: NONREACTIVE
SIGNAL TO CUT-OFF: 0.01 (ref <1.00)

## 2019-08-13 ENCOUNTER — Ambulatory Visit (INDEPENDENT_AMBULATORY_CARE_PROVIDER_SITE_OTHER): Payer: BC Managed Care – PPO | Admitting: Bariatrics

## 2019-08-13 ENCOUNTER — Encounter (INDEPENDENT_AMBULATORY_CARE_PROVIDER_SITE_OTHER): Payer: Self-pay | Admitting: Bariatrics

## 2019-08-13 ENCOUNTER — Other Ambulatory Visit: Payer: Self-pay

## 2019-08-13 VITALS — BP 113/81 | HR 71 | Temp 98.2°F | Ht 63.0 in | Wt 252.0 lb

## 2019-08-13 DIAGNOSIS — E559 Vitamin D deficiency, unspecified: Secondary | ICD-10-CM

## 2019-08-13 DIAGNOSIS — Z6841 Body Mass Index (BMI) 40.0 and over, adult: Secondary | ICD-10-CM

## 2019-08-13 DIAGNOSIS — F3289 Other specified depressive episodes: Secondary | ICD-10-CM

## 2019-08-13 DIAGNOSIS — E8881 Metabolic syndrome: Secondary | ICD-10-CM

## 2019-08-13 DIAGNOSIS — Z9189 Other specified personal risk factors, not elsewhere classified: Secondary | ICD-10-CM

## 2019-08-17 NOTE — Progress Notes (Signed)
Office: [phone]  /  Fax: [phone]   HPI:   Chief Complaint: OBESITY Margaret Porter is here to discuss her progress with her obesity treatment plan. She is on the lower carbohydrate, vegetable and lean protein rich diet plan and is following her eating plan approximately 90 % of the time. She states she is walking 60 minutes 4 to 5 times per week. Margaret Porter is down five pounds. She states that she is doing well. Her weight is 252 lb (114.3 kg) today and has had a weight loss of 5 pounds over a period of 2 weeks since her last visit. She has lost 10 lbs since starting treatment with [name].  Insulin Resistance Margaret Porter has a diagnosis of insulin resistance based on her elevated fasting insulin level >5. Although Margaret Porter's blood glucose readings are still under good control, insulin resistance puts her at greater risk of metabolic syndrome and diabetes. Her last A1c was at 5.0 and last insulin level was at 18.2 She is not taking metformin currently and continues to work on diet and exercise to decrease risk of diabetes. Margaret Porter denies polyphagia.  At risk for diabetes Margaret Porter is at higher than average risk for developing diabetes due to her obesity and insulin resistance. She currently denies polyuria or polydipsia.  Vitamin D deficiency Margaret Porter has a diagnosis of vitamin D deficiency. Her last vitamin D level was at 48.6 She is currently taking vit D and she denies nausea, vomiting or muscle weakness.  Depression with emotional eating behaviors Margaret Porter is struggling with emotional eating and using food for comfort to the extent that it is negatively impacting her health. She often snacks when she is not hungry. Margaret Porter sometimes feels she is out of control and then feels guilty that she made poor food choices. She stopped taking Wellbutrin due to side effects. She has been working on behavior modification techniques to help reduce her emotional eating and has been somewhat successful. She  shows no sign of suicidal or homicidal ideations.  ASSESSMENT AND PLAN:  Insulin resistance  Vitamin D deficiency  At risk for diabetes mellitus  Other depression - Plan: topiramate (TOPAMAX) 50 MG tablet  Class 3 severe obesity with serious comorbidity and body mass index (BMI) of 40.0 to 44.9 in adult, unspecified obesity type ([place])  PLAN:  Insulin Resistance Margaret Porter will continue to work on weight loss, exercise, increasing lean protein and decreasing simple carbohydrates in her diet to help decrease the risk of diabetes. She was informed that eating too many simple carbohydrates or too many calories at one sitting increases the likelihood of GI side effects. Margaret Porter agreed to follow up with [name] as directed to monitor her progress.  Diabetes risk counseling Margaret Porter was given extended (15 minutes) diabetes prevention counseling today. She is 49 y.o. female and has risk factors for diabetes including obesity and insulin resistance. We discussed intensive lifestyle modifications today with an emphasis on weight loss as well as increasing exercise and decreasing simple carbohydrates in her diet.  Vitamin D Deficiency Margaret Porter was informed that low vitamin D levels contributes to fatigue and are associated with obesity, breast, and colon cancer. Margaret Porter will continue to take prescription Vit D @50 ,000 IU two times weekly and she will follow up for routine testing of vitamin D, at least 2-3 times per year. She was informed of the risk of over-replacement of vitamin D and agrees to not increase her dose unless she discusses this with [name] first.  Depression with Emotional Eating Behaviors We discussed  behavior modification techniques today to help Margaret Porter deal with her emotional eating and depression. She has agreed to take Topirmate 50 mg with evening meal #30 with no refills and follow up as directed.  Obesity Margaret Porter is currently in the action stage of change. As such, her goal is to  continue with weight loss efforts She has agreed to follow a lower carbohydrate, vegetable and lean protein rich diet plan Margaret Porter has been instructed to work up to a goal of 150 minutes of combined cardio and strengthening exercise per week for weight loss and overall health benefits. We discussed the following Behavioral Modification Strategies today: increase H2O intake, no skipping meals, keeping healthy foods in the home, increasing lean protein intake, decreasing simple carbohydrates, decrease eating out and work on meal planning and intentional eating  Margaret Porter has agreed to follow up with our clinic in 2 weeks. She was informed of the importance of frequent follow up visits to maximize her success with intensive lifestyle modifications for her multiple health conditions.  ALLERGIES: Allergies  Allergen Reactions  . Molds & Smuts     SOB, chest congestion  . Oxycodone     Questionable mouth itching, sweats, but was also simultaneously on Cipro, Flagyl, and Robaxin.  [name] Kitchen Red Dye     Caused a rash with large amounts  . Wellbutrin [Bupropion] Other (See Comments)    Eyes swelled 3 hours after taking one dose  . Amoxicillin Rash  . Aspirin Anxiety  . Sulfamethoxazole-Trimethoprim Rash    MEDICATIONS: Current Outpatient Medications on File Prior to Visit  Medication Sig Dispense Refill  . diphenhydrAMINE (BENADRYL) 25 MG tablet Take 25 mg by mouth every 6 (six) hours as needed for itching or allergies. Reported on 02/29/2016    . GENVOYA 150-150-200-10 MG TABS tablet TAKE ONE TABLET BY MOUTH ONCE DAILY WITH BREAKFAST. STORE IN ORIGINALCONTAINER AT ROOM TEMPERATURE. 30 tablet 5  . Vitamin D, Ergocalciferol, (DRISDOL) 1.25 MG (50000 UT) CAPS capsule Take 1 capsule (50,000 Units total) by mouth 2 (two) times a week. 24 capsule 1   No current facility-administered medications on file prior to visit.     PAST MEDICAL HISTORY: Past Medical History:  Diagnosis Date  . Allergic rhinitis    . Allergy   . Anemia   . Food allergy   . HIV infection ([place]) 2006  . Idiopathic urticaria   . Joint pain   . Lytic bone lesions on xray    bone lesions on hip,sternum and spine  . Obesity   . Pre-diabetes    pt not on meds  . Rosacea   . Sickle cell anemia (H[facility])    Sickle cell trait with mom  . Swelling of both lower extremities    per pt, left side is more swollen  . Vitamin D deficiency     PAST SURGICAL HISTORY: Past Surgical History:  Procedure Laterality Date  . APPENDECTOMY    . DILATATION & CURRETTAGE/HYSTEROSCOPY WITH RESECTOCOPE N/A 06/27/2015   Procedure: DILATATION & CURETTAGE/HYSTEROSCOPY WITH RESECTOCOPE;  Surgeon: Servando Salina, MD;  Location: [facility];  Service: Gynecology;  Laterality: N/A;  . LAPAROSCOPIC APPENDECTOMY N/A 09/07/2017   Procedure: APPENDECTOMY LAPAROSCOPIC;  Surgeon: Clovis Riley, MD;  Location: [facility];  Service: General;  Laterality: N/A;    SOCIAL HISTORY: Social History   Tobacco Use  . Smoking status: Never Smoker  . Smokeless tobacco: Never Used  Substance Use Topics  . Alcohol use: Yes    Alcohol/week: 0.0 standard drinks  Comment: occasional (special occasions)  . Drug use: No    FAMILY HISTORY: Family History  Problem Relation Age of Onset  . Diabetes Mother   . Diabetes Brother   . Heart disease Paternal Uncle   . Heart disease Maternal Grandmother   . Cancer Maternal Grandmother   . Heart disease Paternal Grandmother   . Diabetes Father   . High blood pressure Father   . Colon cancer Neg Hx   . Colon polyps Neg Hx   . Esophageal cancer Neg Hx   . Rectal cancer Neg Hx   . Stomach cancer Neg Hx     ROS: Review of Systems  Constitutional: Positive for weight loss.  Gastrointestinal: Negative for nausea and vomiting.  Genitourinary: Negative for frequency.  Musculoskeletal:       Negative for muscle weakness  Endo/Heme/Allergies: Negative for polydipsia.       Negative for polyphagia   Psychiatric/Behavioral: Positive for depression. Negative for suicidal ideas.    PHYSICAL EXAM: Blood pressure 113/81, pulse 71, temperature 98.2 F (36.8 C), temperature source Oral, height 5\' 3"  (1.6 m), weight 252 lb (114.3 kg), last menstrual period 07/28/2019, SpO2 97 %. Body mass index is 44.64 kg/m. Physical Exam Vitals signs reviewed.  Constitutional:      Appearance: Normal appearance. She is well-developed. She is obese.  Cardiovascular:     Rate and Rhythm: Normal rate.  Pulmonary:     Effort: Pulmonary effort is normal.  Musculoskeletal: Normal range of motion.  Skin:    General: Skin is warm and dry.  Neurological:     Mental Status: She is alert and oriented to person, place, and time.  Psychiatric:        Mood and Affect: Mood normal.        Behavior: Behavior normal.        Thought Content: Thought content does not include homicidal or suicidal ideation.     RECENT LABS AND TESTS: BMET    Component Value Date/Time   NA 139 08/04/2019 0930   K 4.4 08/04/2019 0930   CL 107 08/04/2019 0930   CO2 22 08/04/2019 0930   GLUCOSE 103 (H) 08/04/2019 0930   BUN 16 08/04/2019 0930   CREATININE 0.96 08/04/2019 0930   CALCIUM 8.8 08/04/2019 0930   CALCIUM 9.6 04/09/2019 1534   GFRNONAA 71 04/03/2019 0913   GFRAA 82 04/03/2019 0913   Lab Results  Component Value Date   HGBA1C 5.0 06/29/2019   HGBA1C 5.3 12/02/2018   HGBA1C 5.4 11/12/2018   HGBA1C 5.3 02/14/2018   HGBA1C 5.1 04/04/2017   Lab Results  Component Value Date   INSULIN 18.2 06/29/2019   INSULIN 21.1 11/12/2018   CBC    Component Value Date/Time   WBC 4.8 04/03/2019 0913   RBC 4.30 04/03/2019 0913   HGB 13.4 04/03/2019 0913   HGB 14.1 11/12/2018 0938   HCT 40.3 04/03/2019 0913   HCT 42.9 11/12/2018 0938   PLT 249 04/03/2019 0913   PLT 257 11/12/2018 0938   MCV 93.7 04/03/2019 0913   MCV 94 11/12/2018 0938   MCH 31.2 04/03/2019 0913   MCHC 33.3 04/03/2019 0913   RDW 13.1 04/03/2019  0913   RDW 13.2 11/12/2018 0938   LYMPHSABS 1,306 04/03/2019 0913   LYMPHSABS 0.9 11/12/2018 0938   MONOABS 0.4 03/05/2019 1235   EOSABS 38 04/03/2019 0913   EOSABS 0.0 11/12/2018 0938   BASOSABS 19 04/03/2019 0913   BASOSABS 0.0 11/12/2018 0938   Iron/TIBC/Ferritin/ %  Sat No results found for: IRON, TIBC, FERRITIN, IRONPCTSAT Lipid Panel     Component Value Date/Time   CHOL 149 12/02/2018 0810   TRIG 95 12/02/2018 0810   HDL 34 (L) 12/02/2018 0810   CHOLHDL 4.4 12/02/2018 0810   VLDL 24 04/04/2017 0935   LDLCALC 96 12/02/2018 0810   Hepatic Function Panel     Component Value Date/Time   PROT 6.2 08/04/2019 0930   PROT 6.0 02/03/2019 0920   ALBUMIN 3.9 03/05/2019 1235   AST 17 08/04/2019 0930   AST 15 03/05/2019 1235   ALT 20 08/04/2019 0930   ALT 18 03/05/2019 1235   ALKPHOS 128 (H) 03/05/2019 1235   BILITOT 0.3 08/04/2019 0930   BILITOT 0.4 03/05/2019 1235   BILIDIR 0.1 12/22/2015 0001   IBILI 0.3 12/22/2015 0001      Component Value Date/Time   TSH 2.421 04/09/2019 1534   TSH 2.170 11/12/2018 0938   TSH 1.985 11/29/2014 1029     Ref. Range 06/29/2019 12:47  Vitamin D, 25-Hydroxy Latest Ref Range: 30.0 - 100.0 ng/mL 48.6    OBESITY BEHAVIORAL INTERVENTION VISIT  Today's visit was # 19   Starting weight: 262 lbs Starting date: 11/12/2018 Today's weight : 252 lbs Today's date: 08/13/2019 Total lbs lost to date: 10    08/13/2019  Height 5\' 3"  (1.6 m)  Weight 252 lb (114.3 kg)  BMI (Calculated) 44.65  BLOOD PRESSURE - SYSTOLIC 502  BLOOD PRESSURE - DIASTOLIC 81   Body Fat % 77.4 %  Total Body Water (lbs) 86.4 lbs    ASK: We discussed the diagnosis of obesity with Margaret Porter today and Margaret Porter agreed to give [name] permission to discuss obesity behavioral modification therapy today.  ASSESS: Margaret Porter has the diagnosis of obesity and her BMI today is 44.65 Ailany is in the action stage of change   ADVISE: Kynadi was educated on the multiple  health risks of obesity as well as the benefit of weight loss to improve her health. She was advised of the need for long term treatment and the importance of lifestyle modifications to improve her current health and to decrease her risk of future health problems.  AGREE: Multiple dietary modification options and treatment options were discussed and  Margaret Porter agreed to follow the recommendations documented in the above note.  ARRANGE: Margaret Porter was educated on the importance of frequent visits to treat obesity as outlined per CMS and USPSTF guidelines and agreed to schedule her next follow up appointment today.  Corey Skains, am acting as Location manager for [employer]. Owens Shark, DO  I have reviewed the above documentation for accuracy and completeness, and I agree with the above. -Jearld Lesch, DO

## 2019-08-18 MED ORDER — TOPIRAMATE 50 MG PO TABS: 50.0000 mg | ORAL_TABLET | Freq: Every day | ORAL | 0 refills | Status: DC

## 2019-08-25 ENCOUNTER — Encounter (INDEPENDENT_AMBULATORY_CARE_PROVIDER_SITE_OTHER): Payer: Self-pay

## 2019-08-27 ENCOUNTER — Encounter (INDEPENDENT_AMBULATORY_CARE_PROVIDER_SITE_OTHER): Payer: Self-pay | Admitting: Bariatrics

## 2019-08-27 ENCOUNTER — Other Ambulatory Visit: Payer: Self-pay

## 2019-08-27 ENCOUNTER — Ambulatory Visit (INDEPENDENT_AMBULATORY_CARE_PROVIDER_SITE_OTHER): Payer: BC Managed Care – PPO | Admitting: Bariatrics

## 2019-08-27 VITALS — BP 107/78 | HR 76 | Temp 97.9°F | Ht 63.0 in | Wt 253.0 lb

## 2019-08-27 DIAGNOSIS — E559 Vitamin D deficiency, unspecified: Secondary | ICD-10-CM | POA: Diagnosis not present

## 2019-08-27 DIAGNOSIS — F3289 Other specified depressive episodes: Secondary | ICD-10-CM

## 2019-08-27 DIAGNOSIS — E8881 Metabolic syndrome: Secondary | ICD-10-CM | POA: Diagnosis not present

## 2019-08-27 DIAGNOSIS — E88819 Insulin resistance, unspecified: Secondary | ICD-10-CM

## 2019-08-27 DIAGNOSIS — E66813 Obesity, class 3: Secondary | ICD-10-CM

## 2019-08-27 DIAGNOSIS — Z9189 Other specified personal risk factors, not elsewhere classified: Secondary | ICD-10-CM

## 2019-08-27 DIAGNOSIS — Z6841 Body Mass Index (BMI) 40.0 and over, adult: Secondary | ICD-10-CM

## 2019-08-31 ENCOUNTER — Encounter (INDEPENDENT_AMBULATORY_CARE_PROVIDER_SITE_OTHER): Payer: Self-pay | Admitting: Bariatrics

## 2019-08-31 NOTE — Progress Notes (Signed)
Office: [phone]  /  Fax: [phone]   HPI:   Chief Complaint: OBESITY Margaret Porter is here to discuss her progress with her obesity treatment plan. She is on the lower carbohydrate, vegetable and lean protein rich diet plan and is following her eating plan approximately 90 % of the time. She states she is walking 60 minutes 4 times per week. Margaret Porter is up 1 pound. She has been "picking up" dinner. Margaret Porter has been on the "low carbohydrate diet". She has gotten (meat, salad) and mozzarella sticks, cauliflower. Margaret Porter is retaining water weight. Her weight is 253 lb (114.8 kg) today and has had a weight gain of 1 pound over a period of 2 weeks since her last visit. She has lost 9 lbs since starting treatment with [name].  Insulin Resistance Margaret Porter has a diagnosis of insulin resistance based on her elevated fasting insulin level >5. Although Margaret Porter's blood glucose readings are still under good control, insulin resistance puts her at greater risk of metabolic syndrome and diabetes. She is not taking metformin currently and continues to work on diet and exercise to decrease risk of diabetes. Margaret Porter denies polyphagia.  At risk for diabetes Margaret Porter is at higher than average risk for developing diabetes due to her obesity and insulin resistance. She currently denies polyuria or polydipsia.  Vitamin D deficiency Margaret Porter has a diagnosis of vitamin D deficiency. She is currently taking vit D and denies nausea, vomiting or muscle weakness.  Seasonal Allergies Margaret Porter has a diagnosis of seasonal allergies and she admits to watery eyes and congestion.  Depression with emotional eating behaviors Margaret Porter is starting Topamax, and she will start this week. Margaret Porter is struggling with emotional eating and using food for comfort to the extent that it is negatively impacting her health. She often snacks when she is not hungry. Margaret Porter sometimes feels she is out of control and then feels guilty  that she made poor food choices. She has been working on behavior modification techniques to help reduce her emotional eating and has been somewhat successful. She shows no sign of suicidal or homicidal ideations.  ASSESSMENT AND PLAN:  Insulin resistance  Vitamin D deficiency  Other depression - with emotional eating   At risk for diabetes mellitus  Class 3 severe obesity with serious comorbidity and body mass index (BMI) of 40.0 to 44.9 in adult, unspecified obesity type ([place])  PLAN:  Insulin Resistance Margaret Porter will continue to work on weight loss, increasing activity, increasing lean protein and decreasing simple carbohydrates in her diet to help decrease the risk of diabetes. She was informed that eating too many simple carbohydrates or too many calories at one sitting increases the likelihood of GI side effects. Margaret Porter agreed to follow up with [name] as directed to monitor her progress.  Diabetes risk counseling Margaret Porter was given extended (15 minutes) diabetes prevention counseling today. She is 49 y.o. female and has risk factors for diabetes including obesity and insulin resistance. We discussed intensive lifestyle modifications today with an emphasis on weight loss as well as increasing exercise and decreasing simple carbohydrates in her diet.  Vitamin D Deficiency Margaret Porter was informed that low vitamin D levels contributes to fatigue and are associated with obesity, breast, and colon cancer. She agrees to continue to take prescription Vit D @50 ,000 IU two times weekly and will follow up for routine testing of vitamin D, at least 2-3 times per year. She was informed of the risk of over-replacement of vitamin D and agrees to not increase her  dose unless she discusses this with [name] first.  Seasonal Allergies Margaret Porter agrees to begin oral antihistamines (off-brand Zyrtec) and OTC Flonase. She will use OTC Pataday allergy eye drops. Margaret Porter agrees to follow up with our clinic at the  agreed upon time.  Depression with Emotional Eating Behaviors We discussed behavior modification techniques today to help Margaret Porter deal with her emotional eating and depression. She has agreed to continue Topamax and follow up as directed.  Obesity Margaret Porter is currently in the action stage of change. As such, her goal is to continue with weight loss efforts She has agreed to follow the Category 3 plan Margaret Porter will continue her exercise regimen for weight loss and overall health benefits. We discussed the following Behavioral Modification Strategies today: planning for success, increase H2O intake, no skipping meals, keeping healthy foods in the home, increasing lean protein intake, decreasing simple carbohydrates, increasing vegetables, decrease eating out and work on meal planning and intentional eating  Margaret Porter has agreed to follow up with our clinic in 3 weeks. She was informed of the importance of frequent follow up visits to maximize her success with intensive lifestyle modifications for her multiple health conditions.  ALLERGIES: Allergies  Allergen Reactions   Molds & Smuts     SOB, chest congestion   Oxycodone     Questionable mouth itching, sweats, but was also simultaneously on Cipro, Flagyl, and Robaxin.   Red Dye     Caused a rash with large amounts   Wellbutrin [Bupropion] Other (See Comments)    Eyes swelled 3 hours after taking one dose   Amoxicillin Rash   Aspirin Anxiety   Sulfamethoxazole-Trimethoprim Rash    MEDICATIONS: Current Outpatient Medications on File Prior to Visit  Medication Sig Dispense Refill   diphenhydrAMINE (BENADRYL) 25 MG tablet Take 25 mg by mouth every 6 (six) hours as needed for itching or allergies. Reported on 02/29/2016     GENVOYA 150-150-200-10 MG TABS tablet TAKE ONE TABLET BY MOUTH ONCE DAILY WITH BREAKFAST. STORE IN ORIGINALCONTAINER AT ROOM TEMPERATURE. 30 tablet 5   topiramate (TOPAMAX) 50 MG tablet Take 1 tablet (50 mg  total) by mouth daily. 30 tablet 0   Vitamin D, Ergocalciferol, (DRISDOL) 1.25 MG (50000 UT) CAPS capsule Take 1 capsule (50,000 Units total) by mouth 2 (two) times a week. 24 capsule 1   No current facility-administered medications on file prior to visit.     PAST MEDICAL HISTORY: Past Medical History:  Diagnosis Date   Allergic rhinitis    Allergy    Anemia    Food allergy    HIV infection ([place]) 2006   Idiopathic urticaria    Joint pain    Lytic bone lesions on xray    bone lesions on hip,sternum and spine   Obesity    Pre-diabetes    pt not on meds   Rosacea    Sickle cell anemia (HCC)    Sickle cell trait with mom   Swelling of both lower extremities    per pt, left side is more swollen   Vitamin D deficiency     PAST SURGICAL HISTORY: Past Surgical History:  Procedure Laterality Date   APPENDECTOMY     DILATATION & CURRETTAGE/HYSTEROSCOPY WITH RESECTOCOPE N/A 06/27/2015   Procedure: DILATATION & CURETTAGE/HYSTEROSCOPY WITH RESECTOCOPE;  Surgeon: Servando Salina, MD;  Location: [facility];  Service: Gynecology;  Laterality: N/A;   LAPAROSCOPIC APPENDECTOMY N/A 09/07/2017   Procedure: APPENDECTOMY LAPAROSCOPIC;  Surgeon: Clovis Riley, MD;  Location: [facility];  Service: General;  Laterality: N/A;    SOCIAL HISTORY: Social History   Tobacco Use   Smoking status: Never Smoker   Smokeless tobacco: Never Used  Substance Use Topics   Alcohol use: Yes    Alcohol/week: 0.0 standard drinks    Comment: occasional (special occasions)   Drug use: No    FAMILY HISTORY: Family History  Problem Relation Age of Onset   Diabetes Mother    Diabetes Brother    Heart disease Paternal Uncle    Heart disease Maternal Grandmother    Cancer Maternal Grandmother    Heart disease Paternal Grandmother    Diabetes Father    High blood pressure Father    Colon cancer Neg Hx    Colon polyps Neg Hx    Esophageal cancer Neg Hx    Rectal cancer Neg  Hx    Stomach cancer Neg Hx     ROS: Review of Systems  Constitutional: Negative for weight loss.  HENT: Positive for congestion.   Eyes:       Positive for Watery Eyes  Gastrointestinal: Negative for nausea and vomiting.  Genitourinary: Negative for frequency.  Musculoskeletal:       Negative for muscle weakness  Endo/Heme/Allergies: Negative for polydipsia.       Negative for polyphagia  Psychiatric/Behavioral: Positive for depression. Negative for suicidal ideas.    PHYSICAL EXAM: Blood pressure 107/78, pulse 76, temperature 97.9 F (36.6 C), temperature source Oral, height 5\' 3"  (1.6 m), weight 253 lb (114.8 kg), last menstrual period 07/28/2019, SpO2 96 %. Body mass index is 44.82 kg/m. Physical Exam Vitals signs reviewed.  Constitutional:      Appearance: Normal appearance. She is well-developed. She is obese.  Cardiovascular:     Rate and Rhythm: Normal rate.  Pulmonary:     Effort: Pulmonary effort is normal.  Musculoskeletal: Normal range of motion.  Skin:    General: Skin is warm and dry.  Neurological:     Mental Status: She is alert and oriented to person, place, and time.  Psychiatric:        Mood and Affect: Mood normal.        Behavior: Behavior normal.        Thought Content: Thought content does not include homicidal or suicidal ideation.     RECENT LABS AND TESTS: BMET    Component Value Date/Time   NA 139 08/04/2019 0930   K 4.4 08/04/2019 0930   CL 107 08/04/2019 0930   CO2 22 08/04/2019 0930   GLUCOSE 103 (H) 08/04/2019 0930   BUN 16 08/04/2019 0930   CREATININE 0.96 08/04/2019 0930   CALCIUM 8.8 08/04/2019 0930   CALCIUM 9.6 04/09/2019 1534   GFRNONAA 71 04/03/2019 0913   GFRAA 82 04/03/2019 0913   Lab Results  Component Value Date   HGBA1C 5.0 06/29/2019   HGBA1C 5.3 12/02/2018   HGBA1C 5.4 11/12/2018   HGBA1C 5.3 02/14/2018   HGBA1C 5.1 04/04/2017   Lab Results  Component Value Date   INSULIN 18.2 06/29/2019   INSULIN  21.1 11/12/2018   CBC    Component Value Date/Time   WBC 4.8 04/03/2019 0913   RBC 4.30 04/03/2019 0913   HGB 13.4 04/03/2019 0913   HGB 14.1 11/12/2018 0938   HCT 40.3 04/03/2019 0913   HCT 42.9 11/12/2018 0938   PLT 249 04/03/2019 0913   PLT 257 11/12/2018 0938   MCV 93.7 04/03/2019 0913   MCV 94 11/12/2018 0938   MCH 31.2  04/03/2019 0913   MCHC 33.3 04/03/2019 0913   RDW 13.1 04/03/2019 0913   RDW 13.2 11/12/2018 0938   LYMPHSABS 1,306 04/03/2019 0913   LYMPHSABS 0.9 11/12/2018 0938   MONOABS 0.4 03/05/2019 1235   EOSABS 38 04/03/2019 0913   EOSABS 0.0 11/12/2018 0938   BASOSABS 19 04/03/2019 0913   BASOSABS 0.0 11/12/2018 0938   Iron/TIBC/Ferritin/ %Sat No results found for: IRON, TIBC, FERRITIN, IRONPCTSAT Lipid Panel     Component Value Date/Time   CHOL 149 12/02/2018 0810   TRIG 95 12/02/2018 0810   HDL 34 (L) 12/02/2018 0810   CHOLHDL 4.4 12/02/2018 0810   VLDL 24 04/04/2017 0935   LDLCALC 96 12/02/2018 0810   Hepatic Function Panel     Component Value Date/Time   PROT 6.2 08/04/2019 0930   PROT 6.0 02/03/2019 0920   ALBUMIN 3.9 03/05/2019 1235   AST 17 08/04/2019 0930   AST 15 03/05/2019 1235   ALT 20 08/04/2019 0930   ALT 18 03/05/2019 1235   ALKPHOS 128 (H) 03/05/2019 1235   BILITOT 0.3 08/04/2019 0930   BILITOT 0.4 03/05/2019 1235   BILIDIR 0.1 12/22/2015 0001   IBILI 0.3 12/22/2015 0001      Component Value Date/Time   TSH 2.421 04/09/2019 1534   TSH 2.170 11/12/2018 0938   TSH 1.985 11/29/2014 1029     Ref. Range 06/29/2019 12:47  Vitamin D, 25-Hydroxy Latest Ref Range: 30.0 - 100.0 ng/mL 48.6   OBESITY BEHAVIORAL INTERVENTION VISIT  Today's visit was # 20   Starting weight: 262 lbs Starting date: 11/12/2018 Today's weight : 253 lbs Today's date: 08/27/2019 Total lbs lost to date: 9    08/27/2019  Height 5\' 3"  (1.6 m)  Weight 253 lb (114.8 kg)  BMI (Calculated) 44.83  BLOOD PRESSURE - SYSTOLIC [id number]  BLOOD PRESSURE - DIASTOLIC  78   Body Fat % 45.9 %  Total Body Water (lbs) 93.8 lbs    ASK: We discussed the diagnosis of obesity with Judieth Keens today and Ryleigh agreed to give [name] permission to discuss obesity behavioral modification therapy today.  ASSESS: Chanon has the diagnosis of obesity and her BMI today is 44.83 Carmilla is in the action stage of change   ADVISE: Stephnie was educated on the multiple health risks of obesity as well as the benefit of weight loss to improve her health. She was advised of the need for long term treatment and the importance of lifestyle modifications to improve her current health and to decrease her risk of future health problems.  AGREE: Multiple dietary modification options and treatment options were discussed and  Monte agreed to follow the recommendations documented in the above note.  ARRANGE: Ranna was educated on the importance of frequent visits to treat obesity as outlined per CMS and USPSTF guidelines and agreed to schedule her next follow up appointment today.  Corey Skains, am acting as Location manager for [employer]. Owens Shark, DO  I have reviewed the above documentation for accuracy and completeness, and I agree with the above. -Jearld Lesch, DO

## 2019-09-16 ENCOUNTER — Encounter: Payer: Self-pay | Admitting: *Deleted

## 2019-09-16 LAB — HIV-1 RNA QUANT-NO REFLEX-BLD: HIV-1 RNA Viral Load: <40

## 2019-09-17 ENCOUNTER — Other Ambulatory Visit: Payer: Self-pay

## 2019-09-17 ENCOUNTER — Encounter (INDEPENDENT_AMBULATORY_CARE_PROVIDER_SITE_OTHER): Payer: Self-pay | Admitting: Physician Assistant

## 2019-09-17 ENCOUNTER — Ambulatory Visit (INDEPENDENT_AMBULATORY_CARE_PROVIDER_SITE_OTHER): Payer: BC Managed Care – PPO | Admitting: Bariatrics

## 2019-09-17 ENCOUNTER — Ambulatory Visit (INDEPENDENT_AMBULATORY_CARE_PROVIDER_SITE_OTHER): Payer: BC Managed Care – PPO | Admitting: Physician Assistant

## 2019-09-17 VITALS — BP 114/75 | HR 67 | Temp 98.0°F | Ht 63.0 in | Wt 252.0 lb

## 2019-09-17 DIAGNOSIS — E66813 Obesity, class 3: Secondary | ICD-10-CM

## 2019-09-17 DIAGNOSIS — F3289 Other specified depressive episodes: Secondary | ICD-10-CM

## 2019-09-17 DIAGNOSIS — E559 Vitamin D deficiency, unspecified: Secondary | ICD-10-CM

## 2019-09-17 DIAGNOSIS — Z6841 Body Mass Index (BMI) 40.0 and over, adult: Secondary | ICD-10-CM

## 2019-09-17 DIAGNOSIS — Z9189 Other specified personal risk factors, not elsewhere classified: Secondary | ICD-10-CM

## 2019-09-17 MED ORDER — TOPIRAMATE 50 MG PO TABS: 50.0000 mg | ORAL_TABLET | Freq: Every day | ORAL | 0 refills | Status: DC

## 2019-09-22 NOTE — Progress Notes (Signed)
Office: [phone]  /  Fax: [phone]   HPI:   Chief Complaint: OBESITY Margaret Porter is here to discuss her progress with her obesity treatment plan. She is on the Category 3 plan and is following her eating plan approximately 80 % of the time. She states she is walking 45 minutes 4 times per week. Tyna reports that she is a very picky eater and she doesn't like the food on the Category 3 plan. She is not eating enough during the day. Her weight is 252 lb (114.3 kg) today and has had a weight loss of 1 pound over a period of 3 weeks since her last visit. She has lost 10 lbs since starting treatment with [name].  Vitamin D deficiency Margaret Porter has a diagnosis of vitamin D deficiency. Margaret Porter is on vit D and she denies nausea, vomiting or muscle weakness.  At risk for osteopenia and osteoporosis Margaret Porter is at higher risk of osteopenia and osteoporosis due to vitamin D deficiency.   Depression with emotional eating behaviors Margaret Porter is on Topamax and she reports that it is not helping. Margaret Porter is struggling with emotional eating and using food for comfort to the extent that it is negatively impacting her health. She often snacks when she is not hungry. Margaret Porter sometimes feels she is out of control and then feels guilty that she made poor food choices. She has been working on behavior modification techniques to help reduce her emotional eating and has been somewhat successful. She shows no sign of suicidal or homicidal ideations.  ASSESSMENT AND PLAN:  Vitamin D deficiency  Other depression - Plan: topiramate (TOPAMAX) 50 MG tablet  At risk for osteoporosis  Class 3 severe obesity with serious comorbidity and body mass index (BMI) of 40.0 to 44.9 in adult, unspecified obesity type ([place])  PLAN:  Vitamin D Deficiency Margaret Porter was informed that low vitamin D levels contributes to fatigue and are associated with obesity, breast, and colon cancer. Echo will continue to take  prescription Vit D @50 ,000 IU two times weekly and she will follow up for routine testing of vitamin D, at least 2-3 times per year. She was informed of the risk of over-replacement of vitamin D and agrees to not increase her dose unless she discusses this with [name] first.  At risk for osteopenia and osteoporosis Margaret Porter was given extended  (15 minutes) osteoporosis prevention counseling today. Margaret Porter is at risk for osteopenia and osteoporosis due to her vitamin D deficiency. She was encouraged to take her vitamin D and follow her higher calcium diet and increase strengthening exercise to help strengthen her bones and decrease her risk of osteopenia and osteoporosis.  Depression with Emotional Eating Behaviors We discussed behavior modification techniques today to help Margaret Porter deal with her emotional eating and depression. She has agreed to continue Topamax 50 mg once daily #30 with no refills and follow up as directed.  Obesity Margaret Porter is currently in the action stage of change. As such, her goal is to continue with weight loss efforts She has agreed to change to keeping a food journal with 1500 calories and 95 grams of protein daily Margaret Porter has been instructed to work up to a goal of 150 minutes of combined cardio and strengthening exercise per week for weight loss and overall health benefits. We discussed the following Behavioral Modification Strategies today: keeping healthy foods in the home and work on meal planning and easy cooking plans  Margaret Porter has agreed to follow up with our clinic in 2 weeks.  She was informed of the importance of frequent follow up visits to maximize her success with intensive lifestyle modifications for her multiple health conditions.  ALLERGIES: Allergies  Allergen Reactions   Molds & Smuts     SOB, chest congestion   Oxycodone     Questionable mouth itching, sweats, but was also simultaneously on Cipro, Flagyl, and Robaxin.   Red Dye     Caused a rash  with large amounts   Wellbutrin [Bupropion] Other (See Comments)    Eyes swelled 3 hours after taking one dose   Amoxicillin Rash   Aspirin Anxiety   Sulfamethoxazole-Trimethoprim Rash    MEDICATIONS: Current Outpatient Medications on File Prior to Visit  Medication Sig Dispense Refill   diphenhydrAMINE (BENADRYL) 25 MG tablet Take 25 mg by mouth every 6 (six) hours as needed for itching or allergies. Reported on 02/29/2016     GENVOYA 150-150-200-10 MG TABS tablet TAKE ONE TABLET BY MOUTH ONCE DAILY WITH BREAKFAST. STORE IN ORIGINALCONTAINER AT ROOM TEMPERATURE. 30 tablet 5   Vitamin D, Ergocalciferol, (DRISDOL) 1.25 MG (50000 UT) CAPS capsule Take 1 capsule (50,000 Units total) by mouth 2 (two) times a week. 24 capsule 1   No current facility-administered medications on file prior to visit.     PAST MEDICAL HISTORY: Past Medical History:  Diagnosis Date   Allergic rhinitis    Allergy    Anemia    Food allergy    HIV infection ([place]) 2006   Idiopathic urticaria    Joint pain    Lytic bone lesions on xray    bone lesions on hip,sternum and spine   Obesity    Pre-diabetes    pt not on meds   Rosacea    Sickle cell anemia (HCC)    Sickle cell trait with mom   Swelling of both lower extremities    per pt, left side is more swollen   Vitamin D deficiency     PAST SURGICAL HISTORY: Past Surgical History:  Procedure Laterality Date   APPENDECTOMY     DILATATION & CURRETTAGE/HYSTEROSCOPY WITH RESECTOCOPE N/A 06/27/2015   Procedure: DILATATION & CURETTAGE/HYSTEROSCOPY WITH RESECTOCOPE;  Surgeon: Servando Salina, MD;  Location: [facility];  Service: Gynecology;  Laterality: N/A;   LAPAROSCOPIC APPENDECTOMY N/A 09/07/2017   Procedure: APPENDECTOMY LAPAROSCOPIC;  Surgeon: Clovis Riley, MD;  Location: [facility];  Service: General;  Laterality: N/A;    SOCIAL HISTORY: Social History   Tobacco Use   Smoking status: Never Smoker   Smokeless tobacco: Never  Used  Substance Use Topics   Alcohol use: Yes    Alcohol/week: 0.0 standard drinks    Comment: occasional (special occasions)   Drug use: No    FAMILY HISTORY: Family History  Problem Relation Age of Onset   Diabetes Mother    Diabetes Brother    Heart disease Paternal Uncle    Heart disease Maternal Grandmother    Cancer Maternal Grandmother    Heart disease Paternal Grandmother    Diabetes Father    High blood pressure Father    Colon cancer Neg Hx    Colon polyps Neg Hx    Esophageal cancer Neg Hx    Rectal cancer Neg Hx    Stomach cancer Neg Hx     ROS: Review of Systems  Constitutional: Positive for weight loss.  Gastrointestinal: Negative for nausea and vomiting.  Musculoskeletal:       Negative for muscle weakness  Psychiatric/Behavioral: Positive for depression. Negative for suicidal ideas.  PHYSICAL EXAM: Blood pressure 114/75, pulse 67, temperature 98 F (36.7 C), temperature source Oral, height 5\' 3"  (1.6 m), weight 252 lb (114.3 kg), SpO2 94 %. Body mass index is 44.64 kg/m. Physical Exam Vitals signs reviewed.  Constitutional:      Appearance: Normal appearance. She is well-developed. She is obese.  Cardiovascular:     Rate and Rhythm: Normal rate.  Pulmonary:     Effort: Pulmonary effort is normal.  Musculoskeletal: Normal range of motion.  Skin:    General: Skin is warm and dry.  Neurological:     Mental Status: She is alert and oriented to person, place, and time.  Psychiatric:        Mood and Affect: Mood normal.        Behavior: Behavior normal.        Thought Content: Thought content does not include homicidal or suicidal ideation.     RECENT LABS AND TESTS: BMET    Component Value Date/Time   NA 139 08/04/2019 0930   K 4.4 08/04/2019 0930   CL 107 08/04/2019 0930   CO2 22 08/04/2019 0930   GLUCOSE 103 (H) 08/04/2019 0930   BUN 16 08/04/2019 0930   CREATININE 0.96 08/04/2019 0930   CALCIUM 8.8 08/04/2019 0930     CALCIUM 9.6 04/09/2019 1534   GFRNONAA 71 04/03/2019 0913   GFRAA 82 04/03/2019 0913   Lab Results  Component Value Date   HGBA1C 5.0 06/29/2019   HGBA1C 5.3 12/02/2018   HGBA1C 5.4 11/12/2018   HGBA1C 5.3 02/14/2018   HGBA1C 5.1 04/04/2017   Lab Results  Component Value Date   INSULIN 18.2 06/29/2019   INSULIN 21.1 11/12/2018   CBC    Component Value Date/Time   WBC 4.8 04/03/2019 0913   RBC 4.30 04/03/2019 0913   HGB 13.4 04/03/2019 0913   HGB 14.1 11/12/2018 0938   HCT 40.3 04/03/2019 0913   HCT 42.9 11/12/2018 0938   PLT 249 04/03/2019 0913   PLT 257 11/12/2018 0938   MCV 93.7 04/03/2019 0913   MCV 94 11/12/2018 0938   MCH 31.2 04/03/2019 0913   MCHC 33.3 04/03/2019 0913   RDW 13.1 04/03/2019 0913   RDW 13.2 11/12/2018 0938   LYMPHSABS 1,306 04/03/2019 0913   LYMPHSABS 0.9 11/12/2018 0938   MONOABS 0.4 03/05/2019 1235   EOSABS 38 04/03/2019 0913   EOSABS 0.0 11/12/2018 0938   BASOSABS 19 04/03/2019 0913   BASOSABS 0.0 11/12/2018 0938   Iron/TIBC/Ferritin/ %Sat No results found for: IRON, TIBC, FERRITIN, IRONPCTSAT Lipid Panel     Component Value Date/Time   CHOL 149 12/02/2018 0810   TRIG 95 12/02/2018 0810   HDL 34 (L) 12/02/2018 0810   CHOLHDL 4.4 12/02/2018 0810   VLDL 24 04/04/2017 0935   LDLCALC 96 12/02/2018 0810   Hepatic Function Panel     Component Value Date/Time   PROT 6.2 08/04/2019 0930   PROT 6.0 02/03/2019 0920   ALBUMIN 3.9 03/05/2019 1235   AST 17 08/04/2019 0930   AST 15 03/05/2019 1235   ALT 20 08/04/2019 0930   ALT 18 03/05/2019 1235   ALKPHOS 128 (H) 03/05/2019 1235   BILITOT 0.3 08/04/2019 0930   BILITOT 0.4 03/05/2019 1235   BILIDIR 0.1 12/22/2015 0001   IBILI 0.3 12/22/2015 0001      Component Value Date/Time   TSH 2.421 04/09/2019 1534   TSH 2.170 11/12/2018 0938   TSH 1.985 11/29/2014 1029     Ref. Range 06/29/2019  12:47  Vitamin D, 25-Hydroxy Latest Ref Range: 30.0 - 100.0 ng/mL 48.6    OBESITY  BEHAVIORAL INTERVENTION VISIT  Today's visit was # 21   Starting weight: 262 lbs Starting date: 11/12/2018 Today's weight : 252 lbs   Today's date: 09/17/2019 Total lbs lost to date: 10    09/17/2019  Height 5\' 3"  (1.6 m)  Weight 252 lb (114.3 kg)  BMI (Calculated) 44.65  BLOOD PRESSURE - SYSTOLIC [id number]  BLOOD PRESSURE - DIASTOLIC 75   Body Fat % [id number] %  Total Body Water (lbs) 90 lbs    ASK: We discussed the diagnosis of obesity with Margaret Porter today and Margaret Porter agreed to give [name] permission to discuss obesity behavioral modification therapy today.  ASSESS: Tanyetta has the diagnosis of obesity and her BMI today is 44.65 Margaret Porter is in the action stage of change   ADVISE: Margaret Porter was educated on the multiple health risks of obesity as well as the benefit of weight loss to improve her health. She was advised of the need for long term treatment and the importance of lifestyle modifications to improve her current health and to decrease her risk of future health problems.  AGREE: Multiple dietary modification options and treatment options were discussed and  Margaret Porter agreed to follow the recommendations documented in the above note.  ARRANGE: Margaret Porter was educated on the importance of frequent visits to treat obesity as outlined per CMS and USPSTF guidelines and agreed to schedule her next follow up appointment today.  Corey Skains, am acting as transcriptionist for Abby Potash, PA-C I, Abby Potash, PA-C have reviewed above note and agree with its content

## 2019-09-24 ENCOUNTER — Encounter (HOSPITAL_COMMUNITY)
Admission: RE | Admit: 2019-09-24 | Discharge: 2019-09-24 | Disposition: A | Discharge status: Home / Self Care | Payer: BC Managed Care – PPO | Source: Ambulatory Visit | Attending: Hematology | Admitting: Hematology

## 2019-09-24 ENCOUNTER — Ambulatory Visit (HOSPITAL_COMMUNITY)
Admission: RE | Admit: 2019-09-24 | Discharge: 2019-09-24 | Disposition: A | Discharge status: Home / Self Care | Payer: BC Managed Care – PPO | Source: Ambulatory Visit | Attending: Hematology | Admitting: Hematology

## 2019-09-24 ENCOUNTER — Other Ambulatory Visit: Payer: Self-pay

## 2019-09-24 DIAGNOSIS — C7951 Secondary malignant neoplasm of bone: Secondary | ICD-10-CM

## 2019-09-24 MED ORDER — TECHNETIUM TC 99M MEDRONATE IV KIT
20.7000 | PACK | Freq: Once | INTRAVENOUS | Status: AC
  Administered 2019-09-24: 20.7 via INTRAVENOUS

## 2019-09-25 ENCOUNTER — Other Ambulatory Visit: Payer: Self-pay | Admitting: Hematology

## 2019-09-25 DIAGNOSIS — E559 Vitamin D deficiency, unspecified: Secondary | ICD-10-CM

## 2019-09-29 LAB — HM PAP SMEAR: HM Pap smear: NEGATIVE

## 2019-09-30 ENCOUNTER — Encounter (INDEPENDENT_AMBULATORY_CARE_PROVIDER_SITE_OTHER): Payer: Self-pay | Admitting: Family Medicine

## 2019-09-30 ENCOUNTER — Other Ambulatory Visit: Payer: Self-pay

## 2019-09-30 ENCOUNTER — Ambulatory Visit (INDEPENDENT_AMBULATORY_CARE_PROVIDER_SITE_OTHER): Payer: BC Managed Care – PPO | Admitting: Family Medicine

## 2019-09-30 VITALS — BP 125/76 | HR 65 | Temp 98.0°F | Ht 63.0 in | Wt 252.0 lb

## 2019-09-30 DIAGNOSIS — E88819 Insulin resistance, unspecified: Secondary | ICD-10-CM

## 2019-09-30 DIAGNOSIS — Z6841 Body Mass Index (BMI) 40.0 and over, adult: Secondary | ICD-10-CM

## 2019-09-30 DIAGNOSIS — E8881 Metabolic syndrome: Secondary | ICD-10-CM | POA: Diagnosis not present

## 2019-09-30 NOTE — Progress Notes (Addendum)
Office: [phone]  /  Fax: [phone]   HPI:   Chief Complaint: OBESITY Margaret Porter is here to discuss her progress with her obesity treatment plan. She states she is not following a plan. She states she is walking 45 minutes 3 times per week. Margaret Porter has been off plan. She is a Careers information officer and there is much confusion with school issues due to [place] which has her out of sorts. She is skipping meals and not packing her lunch for school. She has not started journaling as discussed at her last visit and reports she has been drinking sweet tea. Her weight is 252 lb (114.3 kg) today and has not lost weight since her last visit. She has lost 10 lbs since starting treatment with [name].  Insulin Resistance Margaret Porter has a diagnosis of insulin resistance based on her elevated fasting insulin level >5. Although Margaret Porter's blood glucose readings are still under good control, insulin resistance puts her at greater risk of metabolic syndrome and diabetes. She is not taking metformin currently and continues to work on diet and exercise to decrease risk of diabetes. No polyphagia. Lab Results  Component Value Date   HGBA1C 5.0 06/29/2019    ASSESSMENT AND PLAN:  Insulin resistance  Class 3 severe obesity with serious comorbidity and body mass index (BMI) of 40.0 to 44.9 in adult, unspecified obesity type (Margaret Porter)  PLAN:  Insulin Resistance Margaret Porter will continue to work on weight loss, exercise, and decreasing simple carbohydrates in her diet to help decrease the risk of diabetes. Margaret Porter will continue her meal plan and follow-up with [name] as directed to monitor her progress.  Obesity Margaret Porter is currently in the action stage of change. As such, her goal is to continue with weight loss efforts. She has agreed to follow the Category 3 plan or journal 1500 calories + 90 grams of protein. She wants to try to journal while doing the Category 3 plan. She has a friend at work that attends our clinic and they  plan to support each other in sticking to the plan.  Margaret Porter has been instructed to continue her current exercise regimen for weight loss and overall health benefits. We discussed the following Behavioral Modification Strategies today: increasing lean protein intake, no skipping meals, and planning for success.  Margaret Porter has agreed to follow-up with our clinic in 2 weeks. She was informed of the importance of frequent follow-up visits to maximize her success with intensive lifestyle modifications for her multiple health conditions.  ALLERGIES: Allergies  Allergen Reactions  . Molds & Smuts     SOB, chest congestion  . Oxycodone     Questionable mouth itching, sweats, but was also simultaneously on Cipro, Flagyl, and Robaxin.  [name] Kitchen Red Dye     Caused a rash with large amounts  . Wellbutrin [Bupropion] Other (See Comments)    Eyes swelled 3 hours after taking one dose  . Amoxicillin Rash  . Aspirin Anxiety  . Sulfamethoxazole-Trimethoprim Rash    MEDICATIONS: Current Outpatient Medications on File Prior to Visit  Medication Sig Dispense Refill  . diphenhydrAMINE (BENADRYL) 25 MG tablet Take 25 mg by mouth every 6 (six) hours as needed for itching or allergies. Reported on 02/29/2016    . GENVOYA 150-150-200-10 MG TABS tablet TAKE ONE TABLET BY MOUTH ONCE DAILY WITH BREAKFAST. STORE IN ORIGINALCONTAINER AT ROOM TEMPERATURE. 30 tablet 5  . Vitamin D, Ergocalciferol, (DRISDOL) 1.25 MG (50000 UT) CAPS capsule TAKE 1 CAPSULE (50,000 UNITS TOTAL) BY MOUTH 2 (TWO) TIMES  A WEEK. 24 capsule 1   No current facility-administered medications on file prior to visit.     PAST MEDICAL HISTORY: Past Medical History:  Diagnosis Date  . Allergic rhinitis   . Allergy   . Anemia   . Food allergy   . HIV infection ([place]) 2006  . Idiopathic urticaria   . Joint pain   . Lytic bone lesions on xray    bone lesions on hip,sternum and spine  . Obesity   . Pre-diabetes    pt not on meds  . Rosacea   .  Sickle cell anemia (HCC)    Sickle cell trait with mom  . Swelling of both lower extremities    per pt, left side is more swollen  . Vitamin D deficiency     PAST SURGICAL HISTORY: Past Surgical History:  Procedure Laterality Date  . APPENDECTOMY    . DILATATION & CURRETTAGE/HYSTEROSCOPY WITH RESECTOCOPE N/A 06/27/2015   Procedure: DILATATION & CURETTAGE/HYSTEROSCOPY WITH RESECTOCOPE;  Surgeon: Servando Salina, MD;  Location: [facility];  Service: Gynecology;  Laterality: N/A;  . LAPAROSCOPIC APPENDECTOMY N/A 09/07/2017   Procedure: APPENDECTOMY LAPAROSCOPIC;  Surgeon: Clovis Riley, MD;  Location: [facility];  Service: General;  Laterality: N/A;    SOCIAL HISTORY: Social History   Tobacco Use  . Smoking status: Never Smoker  . Smokeless tobacco: Never Used  Substance Use Topics  . Alcohol use: Yes    Alcohol/week: 0.0 standard drinks    Comment: occasional (special occasions)  . Drug use: No    FAMILY HISTORY: Family History  Problem Relation Age of Onset  . Diabetes Mother   . Diabetes Brother   . Heart disease Paternal Uncle   . Heart disease Maternal Grandmother   . Cancer Maternal Grandmother   . Heart disease Paternal Grandmother   . Diabetes Father   . High blood pressure Father   . Colon cancer Neg Hx   . Colon polyps Neg Hx   . Esophageal cancer Neg Hx   . Rectal cancer Neg Hx   . Stomach cancer Neg Hx    ROS: Review of Systems  Endo/Heme/Allergies:       Negative for polyphagia.   PHYSICAL EXAM: Blood pressure 125/76, pulse 65, temperature 98 F (36.7 C), temperature source Oral, height 5\' 3"  (1.6 m), weight 252 lb (114.3 kg), last menstrual period 08/28/2019, SpO2 98 %. Body mass index is 44.64 kg/m. Physical Exam Vitals signs reviewed.  Constitutional:      Appearance: Normal appearance. She is obese.  Cardiovascular:     Rate and Rhythm: Normal rate.     Pulses: Normal pulses.  Pulmonary:     Effort: Pulmonary effort is normal.     Breath  sounds: Normal breath sounds.  Musculoskeletal: Normal range of motion.  Skin:    General: Skin is warm and dry.  Neurological:     Mental Status: She is alert and oriented to person, place, and time.  Psychiatric:        Behavior: Behavior normal.   RECENT LABS AND TESTS: BMET    Component Value Date/Time   NA 139 08/04/2019 0930   K 4.4 08/04/2019 0930   CL 107 08/04/2019 0930   CO2 22 08/04/2019 0930   GLUCOSE 103 (H) 08/04/2019 0930   BUN 16 08/04/2019 0930   CREATININE 0.96 08/04/2019 0930   CALCIUM 8.8 08/04/2019 0930   CALCIUM 9.6 04/09/2019 1534   GFRNONAA 71 04/03/2019 0913   GFRAA 82 04/03/2019  [id number]   Lab Results  Component Value Date   HGBA1C 5.0 06/29/2019   HGBA1C 5.3 12/02/2018   HGBA1C 5.4 11/12/2018   HGBA1C 5.3 02/14/2018   HGBA1C 5.1 04/04/2017   Lab Results  Component Value Date   INSULIN 18.2 06/29/2019   INSULIN 21.1 11/12/2018   CBC    Component Value Date/Time   WBC 4.8 04/03/2019 0913   RBC 4.30 04/03/2019 0913   HGB 13.4 04/03/2019 0913   HGB 14.1 11/12/2018 0938   HCT 40.3 04/03/2019 0913   HCT 42.9 11/12/2018 0938   PLT 249 04/03/2019 0913   PLT 257 11/12/2018 0938   MCV 93.7 04/03/2019 0913   MCV 94 11/12/2018 0938   MCH 31.2 04/03/2019 0913   MCHC 33.3 04/03/2019 0913   RDW 13.1 04/03/2019 0913   RDW 13.2 11/12/2018 0938   LYMPHSABS 1,306 04/03/2019 0913   LYMPHSABS 0.9 11/12/2018 0938   MONOABS 0.4 03/05/2019 1235   EOSABS 38 04/03/2019 0913   EOSABS 0.0 11/12/2018 0938   BASOSABS 19 04/03/2019 0913   BASOSABS 0.0 11/12/2018 0938   Iron/TIBC/Ferritin/ %Sat No results found for: IRON, TIBC, FERRITIN, IRONPCTSAT Lipid Panel     Component Value Date/Time   CHOL 149 12/02/2018 0810   TRIG 95 12/02/2018 0810   HDL 34 (L) 12/02/2018 0810   CHOLHDL 4.4 12/02/2018 0810   VLDL 24 04/04/2017 0935   LDLCALC 96 12/02/2018 0810   Hepatic Function Panel     Component Value Date/Time   PROT 6.2 08/04/2019 0930   PROT 6.0  02/03/2019 0920   ALBUMIN 3.9 03/05/2019 1235   AST 17 08/04/2019 0930   AST 15 03/05/2019 1235   ALT 20 08/04/2019 0930   ALT 18 03/05/2019 1235   ALKPHOS 128 (H) 03/05/2019 1235   BILITOT 0.3 08/04/2019 0930   BILITOT 0.4 03/05/2019 1235   BILIDIR 0.1 12/22/2015 0001   IBILI 0.3 12/22/2015 0001      Component Value Date/Time   TSH 2.421 04/09/2019 1534   TSH 2.170 11/12/2018 0938   TSH 1.985 11/29/2014 1029   Results for BELIA, KOOISTRA (MRN [id number]) as of 09/30/2019 14:02  Ref. Range 06/29/2019 12:47  Vitamin D, 25-Hydroxy Latest Ref Range: 30.0 - 100.0 ng/mL 48.6   OBESITY BEHAVIORAL INTERVENTION VISIT  Today's visit was #22  Starting weight: 262 lbs Starting date: 11/12/2018 Today's weight: 252 lbs  Today's date: 09/30/2019 Total lbs lost to date: 10    09/30/2019  Height 5\' 3"  (1.6 m)  Weight 252 lb (114.3 kg)  BMI (Calculated) 44.65  BLOOD PRESSURE - SYSTOLIC 0000000  BLOOD PRESSURE - DIASTOLIC 76   Body Fat % 0000000 %  Total Body Water (lbs) 88.8 lbs   ASK: We discussed the diagnosis of obesity with Margaret Porter today and Margaret Porter agreed to give [name] permission to discuss obesity behavioral modification therapy today.  ASSESS: Margaret Porter has the diagnosis of obesity and her BMI today is 44.7. Margaret Porter is in the action stage of change.   ADVISE: Margaret Porter was educated on the multiple health risks of obesity as well as the benefit of weight loss to improve her health. She was advised of the need for long term treatment and the importance of lifestyle modifications to improve her current health and to decrease her risk of future health problems.  AGREE: Multiple dietary modification options and treatment options were discussed and  Ira agreed to follow the recommendations documented in the above note.  ARRANGE: Daiya was educated  on the importance of frequent visits to treat obesity as outlined per CMS and USPSTF guidelines and agreed to schedule her  next follow up appointment today.  IMichaelene Song, am acting as Location manager for [employer], FNP  I have reviewed the above documentation for accuracy and completeness, and I agree with the above.  - Omero Kowal, FNP-C.

## 2019-10-08 NOTE — Progress Notes (Signed)
HEMATOLOGY/ONCOLOGY CONSULTATION NOTE  Date of Service: 10/08/2019  Patient Care Team: Tysinger, Camelia Eng, PA-C as PCP - General (Family Medicine) Michel Bickers, MD as PCP - Infectious Diseases (Infectious Diseases) Servando Salina, MD as Consulting Physician (Obstetrics and Gynecology) Georgia Lopes, DO as Consulting Physician (Bariatrics)  CHIEF COMPLAINTS/PURPOSE OF CONSULTATION:  Sclerotic Bone Lesions  HISTORY OF PRESENTING ILLNESS:   Margaret Porter is a wonderful 49 y.o. female who has been referred to [name] by Chana Bode, PA-C for evaluation and management of Sclerotic Bone Lesions. The pt reports that she is doing well overall.   The pt reports that she had appendicitis in September 2018, and had a CT A/P which revealed multiple foci of sclerosis within the spine and pelvic bones. She notes that this was not followed up on until a recent physical.   The pt notes that she has occasional knee and hip pain, and denies any other bone pains or new back pains. She has some occasional mild chest pain which presents when she bends over. The pt is now taking 50k units of Vitamin D once a week.   She denies changes in breathing, new headaches, changes in bowel habits, changes in urinary habits, fevers, chills, night sweats, or unexpected weight loss.   The pt takes Genvoya, diagnosed with HIV in 2003, last viral load was undetected, last CD4 was in the 700s and follows with Dr. Megan Salon in [place]. The pt has not needed to take preventative antibiotics, and has not had problems with opportunistic infections.  The pt notes that her mother has sickle cell trait, and is unsure if she herself has the trait as well.  The pt notes that she is intending to start a diet and exercise routine to lose weight.   She has not had any changes in bowel habits. She notes that her last PAP smear revealed a yeast infection, but other pap smears have been normal. She has had cervical polyps removed  in the past. She has not had anal pap smears. She is up to speed with her mammograms. She is 49 years old and will be having her first colonoscopy later this month.  The pt is a middle school PE teacher.  Of note prior to the patient's visit today, pt has had a CT Chest completed on 02/11/19 with results revealing No lung masses or nodules. No acute findings. 2. 15 mm left breast mass. Although this is likely a benign lesion such as a fibroadenoma, in light of the skeletal lesions, diagnostic mammography with possible left breast ultrasound is recommended. 3. Multiple sclerotic bone lesions consistent with metastatic disease.  Most recent lab results (12/02/18) of CBC w/diff is as follows: all values are WNL. 02/03/19 SPEP revealed all values WNL, including the absence of an M spike.  On review of systems, pt reports good energy levels, eating well, stable weight, moving her bowels well, and denies progressive symptoms, fevers, chills, night sweats, unexpected weight loss, changes in bowel habits, abdominal pains, specific back pain, pelvic pain, new headaches, frequent infections, changes in breathing, and any other symptoms.   On PMHx the pt reports HIV, rosacea, Vitamin D deficiency. 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 The patient's last visit with [name] was on 04/09/2019. The pt reports that she is doing well overall.  The pt reports that she feel fine and is doing well.  Of note since the patient's last visit, pt has had NM Bone Scan Whole Body completed on 09/24/2019 with results revealing "No abnormal radiotracer accumulation on whole-body bone scan."  Lab results today (10/09/19) of CBC w/diff and CMP is as  follows: all values are WNL  PENDING CMP   PENDING VITAMIN D PENDING LACTATE DEHYDROGENASE  On review of systems, pt denies any bone pain, body aches, back pain, changes in bowels, fevers, night sweats, pain around the ribs and any other symptoms.   MEDICAL HISTORY:  Past Medical History:  Diagnosis Date  . Allergic rhinitis   . Allergy   . Anemia   . Food allergy   . HIV infection ([place]) 2006  . Idiopathic urticaria   . Joint pain   . Lytic bone lesions on xray    bone lesions on hip,sternum and spine  . Obesity   . Pre-diabetes    pt not on meds  . Rosacea   . Sickle cell anemia (HCC)    Sickle cell trait with mom  . Swelling of both lower extremities    per pt, left side is more swollen  . Vitamin D deficiency     SURGICAL HISTORY: Past Surgical History:  Procedure Laterality Date  . APPENDECTOMY    . DILATATION & CURRETTAGE/HYSTEROSCOPY WITH RESECTOCOPE N/A 06/27/2015   Procedure: DILATATION & CURETTAGE/HYSTEROSCOPY WITH RESECTOCOPE;  Surgeon: Servando Salina, MD;  Location: [facility];  Service: Gynecology;  Laterality: N/A;  . LAPAROSCOPIC APPENDECTOMY N/A 09/07/2017   Procedure: APPENDECTOMY LAPAROSCOPIC;  Surgeon: Clovis Riley, MD;  Location: [facility];  Service: General;  Laterality: N/A;    SOCIAL HISTORY: Social History   Socioeconomic History  . Marital status: Single    Spouse name: Not on file  . Number of children: Not on file  . Years of education: Not on file  . Highest education level: Not on file  Occupational History  . Occupation: Art therapist  Social Needs  . Financial resource strain: Not on file  . Food insecurity    Worry: Not on file    Inability: Not on file  . Transportation needs    Medical: Not on file    Non-medical: Not on file  Tobacco Use  . Smoking status: Never Smoker  . Smokeless tobacco: Never Used  Substance and Sexual Activity  . Alcohol use: Yes    Alcohol/week: 0.0 standard drinks    Comment: occasional  (special occasions)  . Drug use: No  . Sexual activity: Never    Partners: Male    Birth control/protection: Condom    Comment: given condoms  Lifestyle  . Physical activity    Days per week: Not on file    Minutes per session: Not on file  . Stress: Not on file  Relationships  . Social Herbalist on phone: Not on file    Gets together: Not on file    Attends religious service: Not on file    Active member of club or organization: Not on file    Attends meetings of clubs or organizations: Not on file    Relationship status: Not on file  . Intimate partner violence    Fear of current or ex partner: Not on file    Emotionally abused: Not on file  Physically abused: Not on file    Forced sexual activity: Not on file  Other Topics Concern  . 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   Single, no children    FAMILY HISTORY: Family History  Problem Relation Age of Onset  . Diabetes Mother   . Diabetes Brother   . Heart disease Paternal Uncle   . Heart disease Maternal Grandmother   . Cancer Maternal Grandmother   . Heart disease Paternal Grandmother   . Diabetes Father   . High blood pressure Father   . Colon cancer Neg Hx   . Colon polyps Neg Hx   . Esophageal cancer Neg Hx   . Rectal cancer Neg Hx   . Stomach cancer Neg Hx     ALLERGIES:  is allergic to molds & smuts; oxycodone; red dye; wellbutrin [bupropion]; amoxicillin; aspirin; and sulfamethoxazole-trimethoprim.  MEDICATIONS:  Current Outpatient Medications  Medication Sig Dispense Refill  . diphenhydrAMINE (BENADRYL) 25 MG tablet Take 25 mg by mouth every 6 (six) hours as needed for itching or allergies. Reported on 02/29/2016    . GENVOYA 150-150-200-10 MG TABS tablet TAKE ONE TABLET BY MOUTH ONCE DAILY WITH BREAKFAST. STORE IN ORIGINALCONTAINER AT ROOM TEMPERATURE. 30 tablet 5  . Vitamin D, Ergocalciferol, (DRISDOL) 1.25 MG (50000 UT) CAPS capsule  TAKE 1 CAPSULE (50,000 UNITS TOTAL) BY MOUTH 2 (TWO) TIMES A WEEK. 24 capsule 1   No current facility-administered medications for this visit.     REVIEW OF SYSTEMS:   A 10+ POINT REVIEW OF SYSTEMS WAS OBTAINED including neurology, dermatology, psychiatry, cardiac, respiratory, lymph, extremities, GI, GU, Musculoskeletal, constitutional, breasts, reproductive, HEENT.  All pertinent positives are noted in the HPI.  All others are negative.   PHYSICAL EXAMINATION: ECOG FS:0 - Asymptomatic  There were no vitals filed for this visit. Wt Readings from Last 3 Encounters:  09/30/19 252 lb (114.3 kg)  09/17/19 252 lb (114.3 kg)  08/27/19 253 lb (114.8 kg)   There is no height or weight on file to calculate BMI.    GENERAL:alert, in no acute distress and comfortable SKIN: no acute rashes, no significant lesions EYES: conjunctiva are pink and non-injected, sclera anicteric OROPHARYNX: MMM, no exudates, no oropharyngeal erythema or ulceration NECK: supple, no JVD LYMPH:  no palpable lymphadenopathy in the cervical, axillary or inguinal regions LUNGS: clear to auscultation b/l with normal respiratory effort HEART: regular rate & rhythm ABDOMEN:  normoactive bowel sounds , non tender, not distended. Extremity: no pedal edema PSYCH: alert & oriented x 3 with fluent speech NEURO: no focal motor/sensory deficits   LABORATORY DATA:  I have reviewed the data as listed  . CBC Latest Ref Rng & Units 04/03/2019 03/05/2019 12/02/2018  WBC 3.8 - 10.8 Thousand/uL 4.8 5.0 4.9  Hemoglobin 11.7 - 15.5 g/dL 13.4 13.1 13.3  Hematocrit 35.0 - 45.0 % 40.3 39.9 39.4  Platelets 140 - 400 Thousand/uL 249 232 301    . CMP Latest Ref Rng & Units 08/04/2019 04/09/2019 04/03/2019  Glucose 65 - 99 mg/dL 103(H) - 94  BUN 7 - 25 mg/dL 16 - 11  Creatinine 0.50 - 1.05 mg/dL 0.96 - 0.94  Sodium 135 - 146 mmol/L 139 - 139  Potassium 3.5 - 5.3 mmol/L 4.4 - 4.5  Chloride 98 - 110 mmol/L 107 - 105  CO2 20 - 32 mmol/L 22 -  27  Calcium 8.6 - 10.4 mg/dL 8.8 9.6 8.8  Total Protein  6.1 - 8.1 g/dL 6.2 - 5.9(L)  Total Bilirubin 0.2 - 1.2 mg/dL 0.3 - 0.3  Alkaline Phos 38 - 126 U/L - - -  AST 10 - 35 U/L 17 - 13  ALT 6 - 29 U/L 20 - 12    09/24/2019: ([id number]) NM Bone Scan Whole Body   RADIOGRAPHIC STUDIES: I have personally reviewed the radiological images as listed and agreed with the findings in the report. Nm Bone Scan Whole Body  Result Date: 09/24/2019 CLINICAL DATA:  Bone density assess, risk factors Bone lesion, L/S-spine, incidental on CT, malignancy suspected Multiple bone lesions on spine. ? tumor vs sclerosing bone dysplasia EXAM: NUCLEAR MEDICINE WHOLE BODY BONE SCAN TECHNIQUE: Whole body anterior and posterior images were obtained approximately 3 hours after intravenous injection of radiopharmaceutical. RADIOPHARMACEUTICALS:  20.7 mCi Technetium-106m MDP IV COMPARISON:  PET-CT scan April 02, 2019, CT scan February 11 2019, bone scan December 07, 2014 FINDINGS: No focal radiotracer accumulation within the axillary or appendicular skeleton. Sclerotic lesions on comparison CT scan are not represented on the bone scan. No change from remote bone scan 12/07/2014. IMPRESSION: No abnormal radiotracer accumulation on whole-body bone scan. Electronically Signed   By: Suzy Bouchard M.D.   On: 09/24/2019 17:16    ASSESSMENT & PLAN:  49 y.o. female with  1. Sclerotic Bone Lesions 09/07/17 CT A/P in the setting of active appendicitis which revealed multiple foci of sclerosis within the spine and pelvic bones 11/26/18 Bilateral mammogram did not observe suspicious findings in the left breast, but noted an indeterminate focal 2cm asymmetry in the right breast, which was subsequently [name] and then biopsied on 12/22/18 with needle and revealed a fibroadenoma. 01/29/19 CT A/P revealed Numerous sclerotic osseous lesions, the majority of which are unchanged though with mild progression of a few lesions. These remain  indeterminate with both malignant (metastatic disease and lymphoma) and benign conditions (sclerosing skeletal dysplasias and sarcoidosis for example) on the differential. 2. No other evidence of malignancy or acute abnormality in the abdomen or pelvis. 3. Nonobstructing left renal calculus.  02/11/19 CT Chest revealed No lung masses or nodules.  No acute findings. 2. 15 mm left breast mass. Although this is likely a benign lesion such as a fibroadenoma, in light of the skeletal lesions, diagnostic mammography with possible left breast ultrasound is recommended. 3. Multiple sclerotic bone lesions consistent with metastatic disease. -Believe that the CT Chest could be misread, and should have noted a RIGHT breast mass, rather than left.  Labs upon initial presentation: 02/03/19 SPEP was normal without an M spike detected. 12/02/18 CBC w/diff was normal.  PLAN: -Discussed pt labwork today, 10/09/19;  all values are WNL  PENDING CMP   PENDING VITAMIN D PENDING LACTATE DEHYDROGENASE  - Discussed that whole body bone scan did not show any concerning or new.  -Discussed that Blood counts look normal. Will follow up on vitamin D levels.  -Advised to continuing taking vitamin D and to continue following up with PCP -Recommended bone density test to check for osteopetrosis  -Return to clinic as needed  FOLLOW UP: RTC with Dr Irene Limbo as needed    The total time spent in the appt was 15 minutes and more than 50% was on counseling and direct patient cares.  All of the patient's questions were answered with apparent satisfaction. The patient knows to call the clinic with any problems, questions or concerns.     Sullivan Lone MD [place] AAHIVMS [facility] Hematology/Oncology Physician [facility]  (Office):       [phone] (Work cell):  [phone] (Fax):           [phone]  10/08/2019 9:37 PM  I, Scot Dock, am acting as a scribe for Dr. Sullivan Lone.   .I have reviewed the above  documentation for accuracy and completeness, and I agree with the above. Brunetta Genera MD

## 2019-10-09 ENCOUNTER — Inpatient Hospital Stay: Payer: BC Managed Care – PPO | Attending: Hematology

## 2019-10-09 ENCOUNTER — Inpatient Hospital Stay (HOSPITAL_BASED_OUTPATIENT_CLINIC_OR_DEPARTMENT_OTHER): Payer: BC Managed Care – PPO | Admitting: Hematology

## 2019-10-09 ENCOUNTER — Other Ambulatory Visit: Payer: Self-pay

## 2019-10-09 VITALS — BP 163/68 | HR 66 | Temp 97.8°F | Resp 18 | Ht 63.0 in | Wt 260.9 lb

## 2019-10-09 DIAGNOSIS — Q782 Osteopetrosis: Secondary | ICD-10-CM | POA: Diagnosis not present

## 2019-10-09 DIAGNOSIS — M899 Disorder of bone, unspecified: Secondary | ICD-10-CM | POA: Insufficient documentation

## 2019-10-09 DIAGNOSIS — B20 Human immunodeficiency virus [HIV] disease: Secondary | ICD-10-CM | POA: Insufficient documentation

## 2019-10-09 DIAGNOSIS — E559 Vitamin D deficiency, unspecified: Secondary | ICD-10-CM | POA: Diagnosis not present

## 2019-10-09 DIAGNOSIS — C7951 Secondary malignant neoplasm of bone: Secondary | ICD-10-CM

## 2019-10-09 LAB — CBC WITH DIFFERENTIAL/PLATELET
Abs Immature Granulocytes: 0.01 10*3/uL (ref 0.00–0.07)
Basophils Absolute: 0 10*3/uL (ref 0.0–0.1)
Basophils Relative: 0 %
Eosinophils Absolute: 0 10*3/uL (ref 0.0–0.5)
Eosinophils Relative: 0 %
HCT: 41.7 % (ref 36.0–46.0)
Hemoglobin: 13.5 g/dL (ref 12.0–15.0)
Immature Granulocytes: 0 %
Lymphocytes Relative: 29 %
Lymphs Abs: 1.5 10*3/uL (ref 0.7–4.0)
MCH: 31.3 pg (ref 26.0–34.0)
MCHC: 32.4 g/dL (ref 30.0–36.0)
MCV: 96.5 fL (ref 80.0–100.0)
Monocytes Absolute: 0.5 10*3/uL (ref 0.1–1.0)
Monocytes Relative: 9 %
Neutro Abs: 3.2 10*3/uL (ref 1.7–7.7)
Neutrophils Relative %: 62 %
Platelets: 230 10*3/uL (ref 150–400)
RBC: 4.32 MIL/uL (ref 3.87–5.11)
RDW: 12.8 % (ref 11.5–15.5)
WBC: 5.2 10*3/uL (ref 4.0–10.5)
nRBC: 0 % (ref 0.0–0.2)

## 2019-10-09 LAB — CMP (CANCER CENTER ONLY)
ALT: 20 U/L (ref 0–44)
AST: 18 U/L (ref 15–41)
Albumin: 3.8 g/dL (ref 3.5–5.0)
Alkaline Phosphatase: 122 U/L (ref 38–126)
Anion gap: 7 (ref 5–15)
BUN: 12 mg/dL (ref 6–20)
CO2: 28 mmol/L (ref 22–32)
Calcium: 8.9 mg/dL (ref 8.9–10.3)
Chloride: 107 mmol/L (ref 98–111)
Creatinine: 0.94 mg/dL (ref 0.44–1.00)
GFR, Est AFR Am: >60 mL/min (ref >60)
GFR, Estimated: >60 mL/min (ref >60)
Glucose, Bld: 100 mg/dL — ABNORMAL HIGH (ref 70–99)
Potassium: 4.6 mmol/L (ref 3.5–5.1)
Sodium: 142 mmol/L (ref 135–145)
Total Bilirubin: 0.4 mg/dL (ref 0.3–1.2)
Total Protein: 6.4 g/dL — ABNORMAL LOW (ref 6.5–8.1)

## 2019-10-09 LAB — LACTATE DEHYDROGENASE: LDH: 208 U/L — ABNORMAL HIGH (ref 98–192)

## 2019-10-09 LAB — VITAMIN D 25 HYDROXY (VIT D DEFICIENCY, FRACTURES): Vit D, 25-Hydroxy: 82.82 ng/mL (ref 30–100)

## 2019-10-10 ENCOUNTER — Telehealth: Payer: Self-pay | Admitting: Hematology

## 2019-10-10 NOTE — Telephone Encounter (Signed)
Per 10/9 los RTC with Dr Irene Limbo as needed

## 2019-10-14 ENCOUNTER — Encounter (INDEPENDENT_AMBULATORY_CARE_PROVIDER_SITE_OTHER): Payer: Self-pay | Admitting: Family Medicine

## 2019-10-14 ENCOUNTER — Other Ambulatory Visit: Payer: Self-pay

## 2019-10-14 ENCOUNTER — Ambulatory Visit (INDEPENDENT_AMBULATORY_CARE_PROVIDER_SITE_OTHER): Payer: BC Managed Care – PPO | Admitting: Family Medicine

## 2019-10-14 VITALS — BP 116/82 | HR 65 | Temp 98.2°F | Ht 63.0 in | Wt 258.0 lb

## 2019-10-14 DIAGNOSIS — Z6841 Body Mass Index (BMI) 40.0 and over, adult: Secondary | ICD-10-CM

## 2019-10-14 DIAGNOSIS — E8881 Metabolic syndrome: Secondary | ICD-10-CM

## 2019-10-19 NOTE — Progress Notes (Signed)
Office: [phone]  /  Fax: [phone]   HPI:   Chief Complaint: OBESITY Margaret Porter is here to discuss her progress with her obesity treatment plan. She is on the keep a food journal with 1500 calories and 90 grams of protein daily and the Category 3 plan and is following her eating plan approximately 50 % of the time. She states she is exercising 0 minutes 0 times per week. Margaret Porter is still skipping breakfast, especially on the weekends. She is eating things that are not on the plan. She is eating extra fruit daily.  She is not eating all of the protein on the plan. She admits to eating fried foods. Margaret Porter journaled 4 out of the last 14 days. She had some fruit juice over the weekend. Margaret Porter is not packing her lunch daily. Her weight is 258 lb (117 kg) today and has had a weight gain of 6 pounds over a period of 2 weeks since her last visit. She has lost 4 lbs since starting treatment with [name].  Insulin Resistance Margaret Porter has a diagnosis of insulin resistance based on her elevated fasting insulin level >5. Although Margaret Porter's blood glucose readings are still under good control, insulin resistance puts her at greater risk of metabolic syndrome and diabetes. She gets busy and forgets to eat. Margaret Porter is not taking metformin currently and she continues to work on diet and exercise to decrease risk of diabetes. Margaret Porter denies polyphagia. Lab Results  Component Value Date   HGBA1C 5.0 06/29/2019    ASSESSMENT AND PLAN:  Insulin resistance  Class 3 severe obesity with serious comorbidity and body mass index (BMI) of 45.0 to 49.9 in adult, unspecified obesity type ([place])  PLAN:  Insulin Resistance Margaret Porter will continue to work on weight loss, exercise, and decreasing simple carbohydrates in her diet to help decrease the risk of diabetes. We dicussed metformin including benefits and risks. She was informed that eating too many simple carbohydrates or too many calories at one sitting  increases the likelihood of GI side effects. Triniti will continue with the meal plan and follow up with [name] as directed to monitor her progress.  Obesity Margaret Porter is currently in the action stage of change. As such, her goal is to continue with weight loss efforts She has agreed to follow the Category 3 plan Samayra will walk for 30 minutes, 3 to 4 days per week for weight loss and overall health benefits. We discussed the following Behavioral Modification Strategies today: planning for success, no skipping meals, increasing lean protein intake, decreasing simple carbohydrates/sugary beverages and working on meal planning and easy cooking plans  Margaret Porter has agreed to follow up with our clinic in 2 to 3 weeks. She was informed of the importance of frequent follow up visits to maximize her success with intensive lifestyle modifications for her multiple health conditions.  ALLERGIES: Allergies  Allergen Reactions  . Molds & Smuts     SOB, chest congestion  . Oxycodone     Questionable mouth itching, sweats, but was also simultaneously on Cipro, Flagyl, and Robaxin.  [name] Kitchen Red Dye     Caused a rash with large amounts  . Wellbutrin [Bupropion] Other (See Comments)    Eyes swelled 3 hours after taking one dose  . Amoxicillin Rash  . Aspirin Anxiety  . Sulfamethoxazole-Trimethoprim Rash    MEDICATIONS: Current Outpatient Medications on File Prior to Visit  Medication Sig Dispense Refill  . diphenhydrAMINE (BENADRYL) 25 MG tablet Take 25 mg by mouth every 6 (  six) hours as needed for itching or allergies. Reported on 02/29/2016    . GENVOYA 150-150-200-10 MG TABS tablet TAKE ONE TABLET BY MOUTH ONCE DAILY WITH BREAKFAST. STORE IN ORIGINALCONTAINER AT ROOM TEMPERATURE. 30 tablet 5  . Vitamin D, Ergocalciferol, (DRISDOL) 1.25 MG (50000 UT) CAPS capsule TAKE 1 CAPSULE (50,000 UNITS TOTAL) BY MOUTH 2 (TWO) TIMES A WEEK. 24 capsule 1   No current facility-administered medications on file prior to  visit.     PAST MEDICAL HISTORY: Past Medical History:  Diagnosis Date  . Allergic rhinitis   . Allergy   . Anemia   . Food allergy   . HIV infection ([place]) 2006  . Idiopathic urticaria   . Joint pain   . Lytic bone lesions on xray    bone lesions on hip,sternum and spine  . Obesity   . Pre-diabetes    pt not on meds  . Rosacea   . Sickle cell anemia (H[facility])    Sickle cell trait with mom  . Swelling of both lower extremities    per pt, left side is more swollen  . Vitamin D deficiency     PAST SURGICAL HISTORY: Past Surgical History:  Procedure Laterality Date  . APPENDECTOMY    . DILATATION & CURRETTAGE/HYSTEROSCOPY WITH RESECTOCOPE N/A 06/27/2015   Procedure: DILATATION & CURETTAGE/HYSTEROSCOPY WITH RESECTOCOPE;  Surgeon: Servando Salina, MD;  Location: [facility];  Service: Gynecology;  Laterality: N/A;  . LAPAROSCOPIC APPENDECTOMY N/A 09/07/2017   Procedure: APPENDECTOMY LAPAROSCOPIC;  Surgeon: Clovis Riley, MD;  Location: [facility];  Service: General;  Laterality: N/A;    SOCIAL HISTORY: Social History   Tobacco Use  . Smoking status: Never Smoker  . Smokeless tobacco: Never Used  Substance Use Topics  . Alcohol use: Yes    Alcohol/week: 0.0 standard drinks    Comment: occasional (special occasions)  . Drug use: No    FAMILY HISTORY: Family History  Problem Relation Age of Onset  . Diabetes Mother   . Diabetes Brother   . Heart disease Paternal Uncle   . Heart disease Maternal Grandmother   . Cancer Maternal Grandmother   . Heart disease Paternal Grandmother   . Diabetes Father   . High blood pressure Father   . Colon cancer Neg Hx   . Colon polyps Neg Hx   . Esophageal cancer Neg Hx   . Rectal cancer Neg Hx   . Stomach cancer Neg Hx     ROS: Review of Systems  Constitutional: Negative for weight loss.  Endo/Heme/Allergies:       Negative for polyphagia    PHYSICAL EXAM: Blood pressure 116/82, pulse 65, temperature 98.2 F (36.8 C),  temperature source Oral, height 5\' 3"  (1.6 m), weight 258 lb (117 kg), SpO2 97 %. Body mass index is 45.7 kg/m. Physical Exam Vitals signs reviewed.  Constitutional:      Appearance: Normal appearance. She is well-developed. She is obese.  Cardiovascular:     Rate and Rhythm: Normal rate.  Pulmonary:     Effort: Pulmonary effort is normal.  Musculoskeletal: Normal range of motion.  Skin:    General: Skin is warm and dry.  Neurological:     Mental Status: She is alert and oriented to person, place, and time.  Psychiatric:        Mood and Affect: Mood normal.        Behavior: Behavior normal.     RECENT LABS AND TESTS: BMET    Component Value Date/Time  NA 142 10/09/2019 1122   K 4.6 10/09/2019 1122   CL 107 10/09/2019 1122   CO2 28 10/09/2019 1122   GLUCOSE 100 (H) 10/09/2019 1122   BUN 12 10/09/2019 1122   CREATININE 0.94 10/09/2019 1122   CREATININE 0.96 08/04/2019 0930   CALCIUM 8.9 10/09/2019 1122   CALCIUM 9.6 04/09/2019 1534   GFRNONAA >60 10/09/2019 1122   GFRNONAA 71 04/03/2019 0913   GFRAA >60 10/09/2019 1122   GFRAA 82 04/03/2019 0913   Lab Results  Component Value Date   HGBA1C 5.0 06/29/2019   HGBA1C 5.3 12/02/2018   HGBA1C 5.4 11/12/2018   HGBA1C 5.3 02/14/2018   HGBA1C 5.1 04/04/2017   Lab Results  Component Value Date   INSULIN 18.2 06/29/2019   INSULIN 21.1 11/12/2018   CBC    Component Value Date/Time   WBC 5.2 10/09/2019 1122   RBC 4.32 10/09/2019 1122   HGB 13.5 10/09/2019 1122   HGB 14.1 11/12/2018 0938   HCT 41.7 10/09/2019 1122   HCT 42.9 11/12/2018 0938   PLT 230 10/09/2019 1122   PLT 257 11/12/2018 0938   MCV 96.5 10/09/2019 1122   MCV 94 11/12/2018 0938   MCH 31.3 10/09/2019 1122   MCHC 32.4 10/09/2019 1122   RDW 12.8 10/09/2019 1122   RDW 13.2 11/12/2018 0938   LYMPHSABS 1.5 10/09/2019 1122   LYMPHSABS 0.9 11/12/2018 0938   MONOABS 0.5 10/09/2019 1122   EOSABS 0.0 10/09/2019 1122   EOSABS 0.0 11/12/2018 0938    BASOSABS 0.0 10/09/2019 1122   BASOSABS 0.0 11/12/2018 0938   Iron/TIBC/Ferritin/ %Sat No results found for: IRON, TIBC, FERRITIN, IRONPCTSAT Lipid Panel     Component Value Date/Time   CHOL 149 12/02/2018 0810   TRIG 95 12/02/2018 0810   HDL 34 (L) 12/02/2018 0810   CHOLHDL 4.4 12/02/2018 0810   VLDL 24 04/04/2017 0935   LDLCALC 96 12/02/2018 0810   Hepatic Function Panel     Component Value Date/Time   PROT 6.4 (L) 10/09/2019 1122   PROT 6.0 02/03/2019 0920   ALBUMIN 3.8 10/09/2019 1122   AST 18 10/09/2019 1122   ALT 20 10/09/2019 1122   ALKPHOS 122 10/09/2019 1122   BILITOT 0.4 10/09/2019 1122   BILIDIR 0.1 12/22/2015 0001   IBILI 0.3 12/22/2015 0001      Component Value Date/Time   TSH 2.421 04/09/2019 1534   TSH 2.170 11/12/2018 0938   TSH 1.985 11/29/2014 1029     Ref. Range 10/09/2019 11:22  Vitamin D, 25-Hydroxy Latest Ref Range: 30 - 100 ng/mL 82.82    OBESITY BEHAVIORAL INTERVENTION VISIT  Today's visit was # 23  Starting weight: 262 lbs Starting date: 11/12/2018 Today's weight : 258 lbs Today's date: 10/14/2019 Total lbs lost to date: 4    10/14/2019  Height 5\' 3"  (1.6 m)  Weight 258 lb (117 kg)  BMI (Calculated) 45.71  BLOOD PRESSURE - SYSTOLIC [id number]  BLOOD PRESSURE - DIASTOLIC 82   Body Fat % 51 %  Total Body Water (lbs) 90.6 lbs    ASK: We discussed the diagnosis of obesity with Margaret Porter today and Margaret Porter agreed to give [name] permission to discuss obesity behavioral modification therapy today.  ASSESS: Margaret Porter has the diagnosis of obesity and her BMI today is 45.71 Margaret Porter is in the action stage of change   ADVISE: Margaret Porter was educated on the multiple health risks of obesity as well as the benefit of weight loss to improve her health. She was  advised of the need for long term treatment and the importance of lifestyle modifications to improve her current health and to decrease her risk of future health problems.  AGREE:  Multiple dietary modification options and treatment options were discussed and  Margaret Porter agreed to follow the recommendations documented in the above note.  ARRANGE: Margaret Porter was educated on the importance of frequent visits to treat obesity as outlined per CMS and USPSTF guidelines and agreed to schedule her next follow up appointment today.  I, Doreene Nest, am acting as transcriptionist for [employer], FNP-C  I have reviewed the above documentation for accuracy and completeness, and I agree with the above.  - Ojas Coone, FNP-C.

## 2019-10-20 ENCOUNTER — Encounter (INDEPENDENT_AMBULATORY_CARE_PROVIDER_SITE_OTHER): Payer: Self-pay | Admitting: Family Medicine

## 2019-11-04 ENCOUNTER — Ambulatory Visit (INDEPENDENT_AMBULATORY_CARE_PROVIDER_SITE_OTHER): Payer: BC Managed Care – PPO | Admitting: Family Medicine

## 2019-11-04 ENCOUNTER — Other Ambulatory Visit: Payer: Self-pay

## 2019-11-04 ENCOUNTER — Encounter (INDEPENDENT_AMBULATORY_CARE_PROVIDER_SITE_OTHER): Payer: Self-pay | Admitting: Family Medicine

## 2019-11-04 VITALS — BP 118/78 | HR 64 | Temp 98.5°F | Ht 63.0 in | Wt 254.0 lb

## 2019-11-04 DIAGNOSIS — E8881 Metabolic syndrome: Secondary | ICD-10-CM | POA: Diagnosis not present

## 2019-11-04 DIAGNOSIS — Z6841 Body Mass Index (BMI) 40.0 and over, adult: Secondary | ICD-10-CM

## 2019-11-04 NOTE — Progress Notes (Signed)
Office: [phone]  /  Fax: [phone]   HPI:   Chief Complaint: OBESITY Margaret Porter is here to discuss her progress with her obesity treatment plan. She is on the Category 3 plan and is following her eating plan approximately 85% of the time. She states she is walking 60 minutes 4 times per week. Margaret Porter is doing better about not skipping meals, especially breakfast. She is working on increasing her vegetables. She has started drinking low calorie juice (5 cal/serving) as suggested at last visit. . She reports doing better with packing lunches, but sometimes buys her lunch at a fast food place. She tries to make good choices when she does this. She states she still has leftover Halloween candy in the house which she has been eating.  Her weight is 254 lb (115.2 kg) today and has had a weight loss of 4 pounds over a period of 3 weeks since her last visit. She has lost 8 lbs since starting treatment with [name].  Insulin Resistance Margaret Porter has a diagnosis of insulin resistance based on her elevated fasting insulin level >5. She is not on metformin due to her propensity for skipping meals. She denies polyphagia except when she skips meals.  ASSESSMENT AND PLAN:  Insulin resistance  Class 3 severe obesity with serious comorbidity and body mass index (BMI) of 45.0 to 49.9 in adult, unspecified obesity type (Margaret Porter)  PLAN:  Insulin Resistance Margaret Porter will continue to work on weight loss, exercise, and decreasing simple carbohydrates in her diet to help decrease the risk of diabetes. We dicussed metformin including benefits and risks. She was informed that eating too many simple carbohydrates or too many calories at one sitting increases the likelihood of GI side effects. Margaret Porter was instructed to continue her meal plan and follow-up as directed in 2-3 weeks.  Obesity Margaret Porter is currently in the action stage of change. As such, her goal is to continue with weight loss efforts. She has agreed  to follow the Category 3 plan. Margaret Porter has been instructed to continue her current exercise regimen for weight loss and overall health benefits. We discussed the following Behavioral Modification Strategies today: increasing lean protein intake, decreasing simple carbohydrates, meal prep,   decrease eating out, avoiding temptation,and planning for success.  Margaret Porter has agreed to follow-up with our clinic in 2-3 weeks. She was informed of the importance of frequent follow-up visits to maximize her success with intensive lifestyle modifications for her multiple health conditions.  ALLERGIES: Allergies  Allergen Reactions  . Molds & Smuts     SOB, chest congestion  . Oxycodone     Questionable mouth itching, sweats, but was also simultaneously on Cipro, Flagyl, and Robaxin.  [name] Kitchen Red Dye     Caused a rash with large amounts  . Wellbutrin [Bupropion] Other (See Comments)    Eyes swelled 3 hours after taking one dose  . Amoxicillin Rash  . Aspirin Anxiety  . Sulfamethoxazole-Trimethoprim Rash    MEDICATIONS: Current Outpatient Medications on File Prior to Visit  Medication Sig Dispense Refill  . diphenhydrAMINE (BENADRYL) 25 MG tablet Take 25 mg by mouth every 6 (six) hours as needed for itching or allergies. Reported on 02/29/2016    . GENVOYA 150-150-200-10 MG TABS tablet TAKE ONE TABLET BY MOUTH ONCE DAILY WITH BREAKFAST. STORE IN ORIGINALCONTAINER AT ROOM TEMPERATURE. 30 tablet 5  . Vitamin D, Ergocalciferol, (DRISDOL) 1.25 MG (50000 UT) CAPS capsule TAKE 1 CAPSULE (50,000 UNITS TOTAL) BY MOUTH 2 (TWO) TIMES A WEEK. 24 capsule  1   No current facility-administered medications on file prior to visit.     PAST MEDICAL HISTORY: Past Medical History:  Diagnosis Date  . Allergic rhinitis   . Allergy   . Anemia   . Food allergy   . HIV infection ([place]) 2006  . Idiopathic urticaria   . Joint pain   . Lytic bone lesions on xray    bone lesions on hip,sternum and spine  . Obesity   .  Pre-diabetes    pt not on meds  . Rosacea   . Sickle cell anemia ([facility])    Sickle cell trait with mom  . Swelling of both lower extremities    per pt, left side is more swollen  . Vitamin D deficiency     PAST SURGICAL HISTORY: Past Surgical History:  Procedure Laterality Date  . APPENDECTOMY    . DILATATION & CURRETTAGE/HYSTEROSCOPY WITH RESECTOCOPE N/A 06/27/2015   Procedure: DILATATION & CURETTAGE/HYSTEROSCOPY WITH RESECTOCOPE;  Surgeon: Servando Salina, MD;  Location: [facility];  Service: Gynecology;  Laterality: N/A;  . LAPAROSCOPIC APPENDECTOMY N/A 09/07/2017   Procedure: APPENDECTOMY LAPAROSCOPIC;  Surgeon: Clovis Riley, MD;  Location: [facility];  Service: General;  Laterality: N/A;    SOCIAL HISTORY: Social History   Tobacco Use  . Smoking status: Never Smoker  . Smokeless tobacco: Never Used  Substance Use Topics  . Alcohol use: Yes    Alcohol/week: 0.0 standard drinks    Comment: occasional (special occasions)  . Drug use: No    FAMILY HISTORY: Family History  Problem Relation Age of Onset  . Diabetes Mother   . Diabetes Brother   . Heart disease Paternal Uncle   . Heart disease Maternal Grandmother   . Cancer Maternal Grandmother   . Heart disease Paternal Grandmother   . Diabetes Father   . High blood pressure Father   . Colon cancer Neg Hx   . Colon polyps Neg Hx   . Esophageal cancer Neg Hx   . Rectal cancer Neg Hx   . Stomach cancer Neg Hx    ROS: Review of Systems  Endo/Heme/Allergies:       Negative for polyphagia unless she skips meals.   PHYSICAL EXAM: Blood pressure 118/78, pulse 64, temperature 98.5 F (36.9 C), temperature source Oral, height 5\' 3"  (1.6 m), weight 254 lb (115.2 kg), last menstrual period 10/28/2019, SpO2 97 %. Body mass index is 44.99 kg/m. Physical Exam Vitals signs reviewed.  Constitutional:      Appearance: Normal appearance. She is obese.  Cardiovascular:     Rate and Rhythm: Normal rate.     Pulses: Normal  pulses.  Pulmonary:     Effort: Pulmonary effort is normal.     Breath sounds: Normal breath sounds.  Musculoskeletal: Normal range of motion.  Skin:    General: Skin is warm and dry.  Neurological:     Mental Status: She is alert and oriented to person, place, and time.  Psychiatric:        Behavior: Behavior normal.   RECENT LABS AND TESTS: BMET    Component Value Date/Time   NA 142 10/09/2019 1122   K 4.6 10/09/2019 1122   CL 107 10/09/2019 1122   CO2 28 10/09/2019 1122   GLUCOSE 100 (H) 10/09/2019 1122   BUN 12 10/09/2019 1122   CREATININE 0.94 10/09/2019 1122   CREATININE 0.96 08/04/2019 0930   CALCIUM 8.9 10/09/2019 1122   CALCIUM 9.6 04/09/2019 1534   GFRNONAA >60 10/09/2019  [place] 04/03/2019 0913   GFRAA >60 10/09/2019 1122   GFRAA 82 04/03/2019 0913   Lab Results  Component Value Date   HGBA1C 5.0 06/29/2019   HGBA1C 5.3 12/02/2018   HGBA1C 5.4 11/12/2018   HGBA1C 5.3 02/14/2018   HGBA1C 5.1 04/04/2017   Lab Results  Component Value Date   INSULIN 18.2 06/29/2019   INSULIN 21.1 11/12/2018   CBC    Component Value Date/Time   WBC 5.2 10/09/2019 1122   RBC 4.32 10/09/2019 1122   HGB 13.5 10/09/2019 1122   HGB 14.1 11/12/2018 0938   HCT 41.7 10/09/2019 1122   HCT 42.9 11/12/2018 0938   PLT 230 10/09/2019 1122   PLT 257 11/12/2018 0938   MCV 96.5 10/09/2019 1122   MCV 94 11/12/2018 0938   MCH 31.3 10/09/2019 1122   MCHC 32.4 10/09/2019 1122   RDW 12.8 10/09/2019 1122   RDW 13.2 11/12/2018 0938   LYMPHSABS 1.5 10/09/2019 1122   LYMPHSABS 0.9 11/12/2018 0938   MONOABS 0.5 10/09/2019 1122   EOSABS 0.0 10/09/2019 1122   EOSABS 0.0 11/12/2018 0938   BASOSABS 0.0 10/09/2019 1122   BASOSABS 0.0 11/12/2018 0938   Iron/TIBC/Ferritin/ %Sat No results found for: IRON, TIBC, FERRITIN, IRONPCTSAT Lipid Panel     Component Value Date/Time   CHOL 149 12/02/2018 0810   TRIG 95 12/02/2018 0810   HDL 34 (L) 12/02/2018 0810   CHOLHDL 4.4  12/02/2018 0810   VLDL 24 04/04/2017 0935   LDLCALC 96 12/02/2018 0810   Hepatic Function Panel     Component Value Date/Time   PROT 6.4 (L) 10/09/2019 1122   PROT 6.0 02/03/2019 0920   ALBUMIN 3.8 10/09/2019 1122   AST 18 10/09/2019 1122   ALT 20 10/09/2019 1122   ALKPHOS 122 10/09/2019 1122   BILITOT 0.4 10/09/2019 1122   BILIDIR 0.1 12/22/2015 0001   IBILI 0.3 12/22/2015 0001      Component Value Date/Time   TSH 2.421 04/09/2019 1534   TSH 2.170 11/12/2018 0938   TSH 1.985 11/29/2014 1029   Results for JANYLAH, HUMPHERY (MRN [id number]) as of 11/04/2019 12:22  Ref. Range 10/09/2019 11:22  Vitamin D, 25-Hydroxy Latest Ref Range: 30 - 100 ng/mL 82.82   OBESITY BEHAVIORAL INTERVENTION VISIT  Today's visit was #24  Starting weight: 262 lbs Starting date: 11/12/2018 Today's weight: 254 lbs  Today's date: 11/04/2019 Total lbs lost to date: 8    11/04/2019  Height 5\' 3"  (1.6 m)  Weight 254 lb (115.2 kg)  BMI (Calculated) 45.01  BLOOD PRESSURE - SYSTOLIC 123456  BLOOD PRESSURE - DIASTOLIC 78   Body Fat % [id number] %  Total Body Water (lbs) 86 lbs   ASK: We discussed the diagnosis of obesity with Margaret Porter today and Margaret Porter agreed to give [name] permission to discuss obesity behavioral modification therapy today.  ASSESS: Ngoc has the diagnosis of obesity and her BMI today is 45.1. Neomi is in the action stage of change.   ADVISE: Shandrell was educated on the multiple health risks of obesity as well as the benefit of weight loss to improve her health. She was advised of the need for long term treatment and the importance of lifestyle modifications to improve her current health and to decrease her risk of future health problems.  AGREE: Multiple dietary modification options and treatment options were discussed and  Gissell agreed to follow the recommendations documented in the above note.  ARRANGE: Lashekia was educated  on the importance of frequent visits to  treat obesity as outlined per CMS and USPSTF guidelines and agreed to schedule her next follow up appointment today.  I, Michaelene Song, am acting as Location manager for [employer], [place].  I have reviewed the above documentation for accuracy and completeness, and I agree with the above.  - Nakita Santerre, FNP-C.

## 2019-11-05 ENCOUNTER — Other Ambulatory Visit: Payer: Self-pay

## 2019-11-05 ENCOUNTER — Encounter (INDEPENDENT_AMBULATORY_CARE_PROVIDER_SITE_OTHER): Payer: Self-pay | Admitting: Family Medicine

## 2019-11-05 DIAGNOSIS — B20 Human immunodeficiency virus [HIV] disease: Secondary | ICD-10-CM

## 2019-11-05 DIAGNOSIS — Z113 Encounter for screening for infections with a predominantly sexual mode of transmission: Secondary | ICD-10-CM

## 2019-11-05 DIAGNOSIS — Z79899 Other long term (current) drug therapy: Secondary | ICD-10-CM

## 2019-11-09 ENCOUNTER — Other Ambulatory Visit: Payer: BC Managed Care – PPO

## 2019-11-09 ENCOUNTER — Other Ambulatory Visit (HOSPITAL_COMMUNITY)
Admission: RE | Admit: 2019-11-09 | Discharge: 2019-11-09 | Disposition: A | Discharge status: Home / Self Care | Payer: BC Managed Care – PPO | Source: Ambulatory Visit | Attending: Internal Medicine | Admitting: Internal Medicine

## 2019-11-09 ENCOUNTER — Other Ambulatory Visit: Payer: Self-pay

## 2019-11-09 DIAGNOSIS — B20 Human immunodeficiency virus [HIV] disease: Secondary | ICD-10-CM

## 2019-11-09 DIAGNOSIS — Z113 Encounter for screening for infections with a predominantly sexual mode of transmission: Secondary | ICD-10-CM | POA: Insufficient documentation

## 2019-11-09 DIAGNOSIS — Z79899 Other long term (current) drug therapy: Secondary | ICD-10-CM

## 2019-11-10 LAB — URINE CYTOLOGY ANCILLARY ONLY
Chlamydia: NEGATIVE
Comment: NEGATIVE
Comment: NORMAL
Neisseria Gonorrhea: NEGATIVE

## 2019-11-10 LAB — T-HELPER CELL (CD4) - (RCID CLINIC ONLY)
CD4 % Helper T Cell: 43 % (ref 33–65)
CD4 T Cell Abs: 596 /uL (ref 400–1790)

## 2019-11-12 LAB — CBC WITH DIFFERENTIAL/PLATELET
Absolute Monocytes: 423 cells/uL (ref 200–950)
Basophils Absolute: 10 cells/uL (ref 0–200)
Basophils Relative: 0.2 %
Eosinophils Absolute: 41 cells/uL (ref 15–500)
Eosinophils Relative: 0.8 %
HCT: 42.9 % (ref 35.0–45.0)
Hemoglobin: 14.1 g/dL (ref 11.7–15.5)
Lymphs Abs: 1515 cells/uL (ref 850–3900)
MCH: 30.9 pg (ref 27.0–33.0)
MCHC: 32.9 g/dL (ref 32.0–36.0)
MCV: 94.1 fL (ref 80.0–100.0)
MPV: 11.6 fL (ref 7.5–12.5)
Monocytes Relative: 8.3 %
Neutro Abs: 3111 cells/uL (ref 1500–7800)
Neutrophils Relative %: 61 %
Platelets: 236 10*3/uL (ref 140–400)
RBC: 4.56 10*6/uL (ref 3.80–5.10)
RDW: 13 % (ref 11.0–15.0)
Total Lymphocyte: 29.7 %
WBC: 5.1 10*3/uL (ref 3.8–10.8)

## 2019-11-12 LAB — COMPREHENSIVE METABOLIC PANEL
AG Ratio: 2 (calc) (ref 1.0–2.5)
ALT: 19 U/L (ref 6–29)
AST: 18 U/L (ref 10–35)
Albumin: 4.1 g/dL (ref 3.6–5.1)
Alkaline phosphatase (APISO): 115 U/L (ref 37–153)
BUN: 16 mg/dL (ref 7–25)
CO2: 27 mmol/L (ref 20–32)
Calcium: 9.4 mg/dL (ref 8.6–10.4)
Chloride: 104 mmol/L (ref 98–110)
Creat: 1 mg/dL (ref 0.50–1.05)
Globulin: 2.1 g/dL (calc) (ref 1.9–3.7)
Glucose, Bld: 97 mg/dL (ref 65–99)
Potassium: 4.5 mmol/L (ref 3.5–5.3)
Sodium: 140 mmol/L (ref 135–146)
Total Bilirubin: 0.3 mg/dL (ref 0.2–1.2)
Total Protein: 6.2 g/dL (ref 6.1–8.1)

## 2019-11-12 LAB — LIPID PANEL
Cholesterol: 196 mg/dL (ref <200)
HDL: 44 mg/dL — ABNORMAL LOW (ref >=50)
LDL Cholesterol (Calc): 126 mg/dL (calc) — ABNORMAL HIGH
Non-HDL Cholesterol (Calc): 152 mg/dL (calc) — ABNORMAL HIGH (ref <130)
Total CHOL/HDL Ratio: 4.5 (calc) (ref <5.0)
Triglycerides: 143 mg/dL (ref <150)

## 2019-11-12 LAB — HIV-1 RNA QUANT-NO REFLEX-BLD
HIV 1 RNA Quant: <20 copies/mL
HIV-1 RNA Quant, Log: <1.3 Log copies/mL

## 2019-11-12 LAB — RPR: RPR Ser Ql: NONREACTIVE

## 2019-11-20 ENCOUNTER — Telehealth: Payer: Self-pay

## 2019-11-20 NOTE — Telephone Encounter (Signed)
COVID-19 Pre-Screening Questions:11/20/19   Do you currently have a fever (>100 F), chills or unexplained body aches? *NO   Are you currently experiencing new cough, shortness of breath, sore throat, runny nose? NO .  Have you recently travelled outside the state of [place] in the last 14 days?NO .  Have you been in contact with someone that is currently pending confirmation of Covid19 testing or has been confirmed to have the [place] virus?  NO  **If the patient answers NO to ALL questions -  advise the patient to please call the clinic before coming to the office should any symptoms develop.

## 2019-11-23 ENCOUNTER — Ambulatory Visit: Payer: BC Managed Care – PPO | Admitting: Internal Medicine

## 2019-11-24 ENCOUNTER — Ambulatory Visit (INDEPENDENT_AMBULATORY_CARE_PROVIDER_SITE_OTHER): Payer: BC Managed Care – PPO | Admitting: Internal Medicine

## 2019-11-24 ENCOUNTER — Other Ambulatory Visit: Payer: Self-pay

## 2019-11-24 ENCOUNTER — Ambulatory Visit (INDEPENDENT_AMBULATORY_CARE_PROVIDER_SITE_OTHER): Payer: BC Managed Care – PPO | Admitting: Family Medicine

## 2019-11-24 ENCOUNTER — Encounter (INDEPENDENT_AMBULATORY_CARE_PROVIDER_SITE_OTHER): Payer: Self-pay | Admitting: Family Medicine

## 2019-11-24 VITALS — BP 112/74 | HR 80 | Temp 98.1°F | Ht 63.0 in | Wt 261.0 lb

## 2019-11-24 DIAGNOSIS — Z23 Encounter for immunization: Secondary | ICD-10-CM

## 2019-11-24 DIAGNOSIS — E7849 Other hyperlipidemia: Secondary | ICD-10-CM | POA: Diagnosis not present

## 2019-11-24 DIAGNOSIS — Z9189 Other specified personal risk factors, not elsewhere classified: Secondary | ICD-10-CM | POA: Diagnosis not present

## 2019-11-24 DIAGNOSIS — R609 Edema, unspecified: Secondary | ICD-10-CM | POA: Diagnosis not present

## 2019-11-24 DIAGNOSIS — B20 Human immunodeficiency virus [HIV] disease: Secondary | ICD-10-CM | POA: Diagnosis not present

## 2019-11-24 DIAGNOSIS — Z6841 Body Mass Index (BMI) 40.0 and over, adult: Secondary | ICD-10-CM

## 2019-11-24 DIAGNOSIS — E8881 Metabolic syndrome: Secondary | ICD-10-CM

## 2019-11-24 MED ORDER — SPIRONOLACTONE 50 MG PO TABS: 50.0000 mg | ORAL_TABLET | Freq: Every day | ORAL | 0 refills | Status: DC

## 2019-11-24 NOTE — Assessment & Plan Note (Signed)
Her infection remains under excellent, long-term control.  She received her influenza vaccine today.  I encouraged her to get regular exercise.  She will continue Genvoya and follow-up after lab work in 1 year.

## 2019-11-24 NOTE — Progress Notes (Signed)
Patient Active Problem List   Diagnosis Date Noted  . Human immunodeficiency virus (HIV) disease ([place]) 01/24/2007    Priority: High  . Left knee pain 11/23/2015    Priority: Medium  . Abnormal dreams 01/06/2014    Priority: Medium  . Obesity 11/06/2007    Priority: Medium  . Bony sclerosis 02/03/2019  . Abnormal CT scan, pelvis 02/03/2019  . Achilles tendinitis of left lower extremity 02/03/2019  . S/P appendectomy 01/20/2019  . Vaccine counseling 01/20/2019  . Achilles tendon pain 01/20/2019  . Insulin resistance 12/09/2018  . Hyperglycemia 12/05/2016  . Routine general medical examination at a health care facility 12/22/2015  . Vitamin D deficiency 12/22/2015  . Need for pneumococcal vaccination 12/22/2015  . Alkaline phosphatase elevation 12/22/2015  . Rosacea 04/22/2012  . URINARY INCONTINENCE, URGE 10/24/2010  . Hyperlipidemia 11/06/2007  . URTICARIA, IDIOPATHIC 03/06/2007    Patient's Medications  New Prescriptions   No medications on file  Previous Medications   DIPHENHYDRAMINE (BENADRYL) 25 MG TABLET    Take 25 mg by mouth every 6 (six) hours as needed for itching or allergies. Reported on 02/29/2016   GENVOYA 150-150-200-10 MG TABS TABLET    TAKE ONE TABLET BY MOUTH ONCE DAILY WITH BREAKFAST. STORE IN ORIGINALCONTAINER AT ROOM TEMPERATURE.   VITAMIN D, ERGOCALCIFEROL, (DRISDOL) 1.25 MG (50000 UT) CAPS CAPSULE    TAKE 1 CAPSULE (50,000 UNITS TOTAL) BY MOUTH 2 (TWO) TIMES A WEEK.  Modified Medications   No medications on file  Discontinued Medications   No medications on file    Subjective: Margaret Porter is in for her routine HIV follow-up visit.  She has not had any problems obtaining, taking or tolerating her Genvoya.  He she used to take it first thing in the morning but now takes it at around 4 PM with dinner after she gets home from work.  She thinks she is missed only 1 or 2 doses in the past 6 months.  She is feeling well but has not been getting any  regular exercise during the Covid pandemic and has gained some weight.  Review of Systems: Review of Systems  Constitutional: Negative for fever and weight loss.  Respiratory: Negative for cough, sputum production and shortness of breath.   Cardiovascular: Negative for chest pain.  Gastrointestinal: Negative for abdominal pain, diarrhea, nausea and vomiting.  Psychiatric/Behavioral: Negative for depression. The patient is not nervous/anxious.     Past Medical History:  Diagnosis Date  . Allergic rhinitis   . Allergy   . Anemia   . Food allergy   . HIV infection ([place]) 2006  . Idiopathic urticaria   . Joint pain   . Lytic bone lesions on xray    bone lesions on hip,sternum and spine  . Obesity   . Pre-diabetes    pt not on meds  . Rosacea   . Sickle cell anemia (H[facility])    Sickle cell trait with mom  . Swelling of both lower extremities    per pt, left side is more swollen  . Vitamin D deficiency     Social History   Tobacco Use  . Smoking status: Never Smoker  . Smokeless tobacco: Never Used  Substance Use Topics  . Alcohol use: Yes    Alcohol/week: 0.0 standard drinks    Comment: occasional (special occasions)  . Drug use: No    Family History  Problem Relation Age of Onset  . Diabetes Mother   .  Diabetes Brother   . Heart disease Paternal Uncle   . Heart disease Maternal Grandmother   . Cancer Maternal Grandmother   . Heart disease Paternal Grandmother   . Diabetes Father   . High blood pressure Father   . Colon cancer Neg Hx   . Colon polyps Neg Hx   . Esophageal cancer Neg Hx   . Rectal cancer Neg Hx   . Stomach cancer Neg Hx     Allergies  Allergen Reactions  . Molds & Smuts     SOB, chest congestion  . Oxycodone     Questionable mouth itching, sweats, but was also simultaneously on Cipro, Flagyl, and Robaxin.  [name] Kitchen Red Dye     Caused a rash with large amounts  . Wellbutrin [Bupropion] Other (See Comments)    Eyes swelled 3 hours after taking one  dose  . Amoxicillin Rash  . Aspirin Anxiety  . Sulfamethoxazole-Trimethoprim Rash    Health Maintenance  Topic Date Due  . PAP SMEAR-Modifier  07/31/2014  . INFLUENZA VACCINE  08/01/2019  . MAMMOGRAM  11/26/2020  . TETANUS/TDAP  12/21/2025  . COLONOSCOPY  03/08/2029  . HIV Screening  Completed    Objective:  Vitals:   11/24/19 1415  BP: 136/85  Pulse: 89  Temp: 98.1 F (36.7 C)  TempSrc: Oral  Weight: 266 lb (120.7 kg)   Body mass index is 47.12 kg/m.  Physical Exam Constitutional:      Comments: She is in good spirits.  Her weight is up 14 pounds in the past 2 months.  Cardiovascular:     Rate and Rhythm: Normal rate and regular rhythm.     Heart sounds: No murmur.  Pulmonary:     Effort: Pulmonary effort is normal.     Breath sounds: Normal breath sounds.  Psychiatric:        Mood and Affect: Mood normal.     Lab Results Lab Results  Component Value Date   WBC 5.1 11/09/2019   HGB 14.1 11/09/2019   HCT 42.9 11/09/2019   MCV 94.1 11/09/2019   PLT 236 11/09/2019    Lab Results  Component Value Date   CREATININE 1.00 11/09/2019   BUN 16 11/09/2019   NA 140 11/09/2019   K 4.5 11/09/2019   CL 104 11/09/2019   CO2 27 11/09/2019    Lab Results  Component Value Date   ALT 19 11/09/2019   AST 18 11/09/2019   ALKPHOS 122 10/09/2019   BILITOT 0.3 11/09/2019    Lab Results  Component Value Date   CHOL 196 11/09/2019   HDL 44 (L) 11/09/2019   LDLCALC 126 (H) 11/09/2019   TRIG 143 11/09/2019   CHOLHDL 4.5 11/09/2019   Lab Results  Component Value Date   LABRPR NON-REACTIVE 11/09/2019   HIV 1 RNA Quant  Date Value  11/09/2019 <20 NOT DETECTED copies/mL  04/03/2019 <20 NOT DETECTED copies/mL  12/02/2018 not detected copies   HIV-1 RNA Viral Load (no units)  Date Value  08/04/2019 <40  02/16/2019 <40  08/19/2018 <40   CD4 (no units)  Date Value  11/08/2015 550  02/08/2015 443  12/21/2013 475   CD4 T Cell Abs (/uL)  Date Value   11/09/2019 596  04/03/2019 540     Problem List Items Addressed This Visit      High   Human immunodeficiency virus (HIV) disease ([place])    Her infection remains under excellent, long-term control.  She received her influenza vaccine today.  I encouraged her to get regular exercise.  She will continue Genvoya and follow-up after lab work in 1 year.      Relevant Orders   1 Year CBC   1 Year CD4   1 Year CMP   1 Year Lipid panel   1 Year RPR   1 Year VL        Michel Bickers, MD [facility] [place] [phone] pager   [phone] cell 11/24/2019, 2:31 PM

## 2019-11-25 NOTE — Progress Notes (Signed)
Office: [phone]  /  Fax: [phone]   HPI:   Chief Complaint: OBESITY Margaret Porter is here to discuss her progress with her obesity treatment plan. She is on the Category 3 plan and is following her eating plan approximately 85 % of the time. She states she is walking for 50 minutes 3-4 times per week. Margaret Porter feels bloated and her legs are swollen, which is common for her per patient. She is taking hydrochlorothiazide 12.5 mg. She is up 7 lbs in water. She had recent labs with doctor.  Her weight is 261 lb (118.4 kg) today and has gained 6 lbs since her last visit. She has lost 1 lb since starting treatment with [name].  Insulin Resistance Margaret Porter has a diagnosis of insulin resistance based on her elevated fasting insulin level >5. Although Margaret Porter's blood glucose readings are still under good control, insulin resistance puts her at greater risk of metabolic syndrome and diabetes. She previously tried metformin and continues to work on diet and exercise to decrease risk of diabetes.  At risk for diabetes Margaret Porter is at higher than average risk for developing diabetes due to her obesity and insulin resistance. She currently denies polyuria or polydipsia.  Hyperlipidemia Margaret Porter has hyperlipidemia and has been trying to improve her cholesterol levels with intensive lifestyle modification including a low saturated fat diet, exercise and weight loss. Last LDL was 126 and HDL of 44. She denies any chest pain, claudication or myalgias.  Edema Margaret Porter is up 7 lbs in water weight according to bioimpedance scale. She is has history of sulfa allergy.   ASSESSMENT AND PLAN:  Insulin resistance  Other hyperlipidemia  Edema, unspecified type - Plan: spironolactone (ALDACTONE) 50 MG tablet  At risk for diabetes mellitus  Class 3 severe obesity with serious comorbidity and body mass index (BMI) of 45.0 to 49.9 in adult, unspecified obesity type ([place])  PLAN:  Insulin Resistance Margaret Porter  will continue to work on weight loss, exercise, and decreasing simple carbohydrates in her diet to help decrease the risk of diabetes. We dicussed metformin including benefits and risks. She was informed that eating too many simple carbohydrates or too many calories at one sitting increases the likelihood of GI side effects. Margaret Porter agrees to follow up with [name] as directed to monitor her progress.  Diabetes risk counseling Margaret Porter was given extended (15 minutes) diabetes prevention counseling today. She is 49 y.o. female and has risk factors for diabetes including obesity and insulin resistance. We discussed intensive lifestyle modifications today with an emphasis on weight loss as well as increasing exercise and decreasing simple carbohydrates in her diet.  Hyperlipidemia Margaret Porter was informed of the American Heart Association Guidelines emphasizing intensive lifestyle modifications as the first line treatment for hyperlipidemia. We discussed many lifestyle modifications today in depth, and Margaret Porter will continue to work on decreasing saturated fats such as fatty red meat, butter and many fried foods. She will also increase vegetables and lean protein in her diet and continue to work on exercise and weight loss efforts. We will continue to monitor.  Edema Margaret Porter agrees to start spironolactone 50 mg PO daily #30 with no refills. Margaret Porter agrees to follow up with our clinic in 2 weeks.  Obesity Margaret Porter is currently in the action stage of change. As such, her goal is to continue with weight loss efforts She has agreed to follow the Category 3 plan or journal Margaret Porter has been instructed to work up to a goal of 150 minutes of combined cardio and  strengthening exercise per week or as tolerated for weight loss and overall health benefits. We discussed the following Behavioral Modification Strategies today: increasing lean protein intake, decreasing simple carbohydrates, increasing vegetables, increase  H20 intake, and work on meal planning and easy cooking plans   Margaret Porter has agreed to follow up with our clinic in 2 weeks. She was informed of the importance of frequent follow up visits to maximize her success with intensive lifestyle modifications for her multiple health conditions.  ALLERGIES: Allergies  Allergen Reactions  . Molds & Smuts     SOB, chest congestion  . Oxycodone     Questionable mouth itching, sweats, but was also simultaneously on Cipro, Flagyl, and Robaxin.  [name] Kitchen Red Dye     Caused a rash with large amounts  . Wellbutrin [Bupropion] Other (See Comments)    Eyes swelled 3 hours after taking one dose  . Amoxicillin Rash  . Aspirin Anxiety  . Sulfamethoxazole-Trimethoprim Rash    MEDICATIONS: Current Outpatient Medications on File Prior to Visit  Medication Sig Dispense Refill  . diphenhydrAMINE (BENADRYL) 25 MG tablet Take 25 mg by mouth every 6 (six) hours as needed for itching or allergies. Reported on 02/29/2016    . GENVOYA 150-150-200-10 MG TABS tablet TAKE ONE TABLET BY MOUTH ONCE DAILY WITH BREAKFAST. STORE IN ORIGINALCONTAINER AT ROOM TEMPERATURE. 30 tablet 5  . Vitamin D, Ergocalciferol, (DRISDOL) 1.25 MG (50000 UT) CAPS capsule TAKE 1 CAPSULE (50,000 UNITS TOTAL) BY MOUTH 2 (TWO) TIMES A WEEK. 24 capsule 1   No current facility-administered medications on file prior to visit.     PAST MEDICAL HISTORY: Past Medical History:  Diagnosis Date  . Allergic rhinitis   . Allergy   . Anemia   . Food allergy   . HIV infection ([place]) 2006  . Idiopathic urticaria   . Joint pain   . Lytic bone lesions on xray    bone lesions on hip,sternum and spine  . Obesity   . Pre-diabetes    pt not on meds  . Rosacea   . Sickle cell anemia (HCC)    Sickle cell trait with mom  . Swelling of both lower extremities    per pt, left side is more swollen  . Vitamin D deficiency     PAST SURGICAL HISTORY: Past Surgical History:  Procedure Laterality Date  .  APPENDECTOMY    . DILATATION & CURRETTAGE/HYSTEROSCOPY WITH RESECTOCOPE N/A 06/27/2015   Procedure: DILATATION & CURETTAGE/HYSTEROSCOPY WITH RESECTOCOPE;  Surgeon: Servando Salina, MD;  Location: [facility];  Service: Gynecology;  Laterality: N/A;  . LAPAROSCOPIC APPENDECTOMY N/A 09/07/2017   Procedure: APPENDECTOMY LAPAROSCOPIC;  Surgeon: Clovis Riley, MD;  Location: [facility];  Service: General;  Laterality: N/A;    SOCIAL HISTORY: Social History   Tobacco Use  . Smoking status: Never Smoker  . Smokeless tobacco: Never Used  Substance Use Topics  . Alcohol use: Yes    Alcohol/week: 0.0 standard drinks    Comment: occasional (special occasions)  . Drug use: No    FAMILY HISTORY: Family History  Problem Relation Age of Onset  . Diabetes Mother   . Diabetes Brother   . Heart disease Paternal Uncle   . Heart disease Maternal Grandmother   . Cancer Maternal Grandmother   . Heart disease Paternal Grandmother   . Diabetes Father   . High blood pressure Father   . Colon cancer Neg Hx   . Colon polyps Neg Hx   . Esophageal cancer Neg  Hx   . Rectal cancer Neg Hx   . Stomach cancer Neg Hx     ROS: Review of Systems  Constitutional: Negative for weight loss.  Cardiovascular: Negative for chest pain and claudication.  Genitourinary: Negative for frequency.  Musculoskeletal: Negative for myalgias.  Endo/Heme/Allergies: Negative for polydipsia.    PHYSICAL EXAM: Blood pressure 112/74, pulse 80, temperature 98.1 F (36.7 C), temperature source Oral, height 5\' 3"  (1.6 m), weight 261 lb (118.4 kg), last menstrual period 10/24/2019, SpO2 93 %. Body mass index is 46.23 kg/m. Physical Exam Vitals signs reviewed.  Constitutional:      Appearance: Normal appearance. She is obese.  Cardiovascular:     Rate and Rhythm: Normal rate.     Pulses: Normal pulses.  Pulmonary:     Effort: Pulmonary effort is normal.     Breath sounds: Normal breath sounds.  Musculoskeletal: Normal range  of motion.  Skin:    General: Skin is warm and dry.  Neurological:     Mental Status: She is alert and oriented to person, place, and time.  Psychiatric:        Mood and Affect: Mood normal.        Behavior: Behavior normal.     RECENT LABS AND TESTS: BMET    Component Value Date/Time   NA 140 11/09/2019 0839   K 4.5 11/09/2019 0839   CL 104 11/09/2019 0839   CO2 27 11/09/2019 0839   GLUCOSE 97 11/09/2019 0839   BUN 16 11/09/2019 0839   CREATININE 1.00 11/09/2019 0839   CALCIUM 9.4 11/09/2019 0839   CALCIUM 9.6 04/09/2019 1534   GFRNONAA >60 10/09/2019 1122   GFRNONAA 71 04/03/2019 0913   GFRAA >60 10/09/2019 1122   GFRAA 82 04/03/2019 0913   Lab Results  Component Value Date   HGBA1C 5.0 06/29/2019   HGBA1C 5.3 12/02/2018   HGBA1C 5.4 11/12/2018   HGBA1C 5.3 02/14/2018   HGBA1C 5.1 04/04/2017   Lab Results  Component Value Date   INSULIN 18.2 06/29/2019   INSULIN 21.1 11/12/2018   CBC    Component Value Date/Time   WBC 5.1 11/09/2019 0839   RBC 4.56 11/09/2019 0839   HGB 14.1 11/09/2019 0839   HGB 14.1 11/12/2018 0938   HCT 42.9 11/09/2019 0839   HCT 42.9 11/12/2018 0938   PLT 236 11/09/2019 0839   PLT 257 11/12/2018 0938   MCV 94.1 11/09/2019 0839   MCV 94 11/12/2018 0938   MCH 30.9 11/09/2019 0839   MCHC 32.9 11/09/2019 0839   RDW 13.0 11/09/2019 0839   RDW 13.2 11/12/2018 0938   LYMPHSABS 1,515 11/09/2019 0839   LYMPHSABS 0.9 11/12/2018 0938   MONOABS 0.5 10/09/2019 1122   EOSABS 41 11/09/2019 0839   EOSABS 0.0 11/12/2018 0938   BASOSABS 10 11/09/2019 0839   BASOSABS 0.0 11/12/2018 0938   Iron/TIBC/Ferritin/ %Sat No results found for: IRON, TIBC, FERRITIN, IRONPCTSAT Lipid Panel     Component Value Date/Time   CHOL 196 11/09/2019 0839   TRIG 143 11/09/2019 0839   HDL 44 (L) 11/09/2019 0839   CHOLHDL 4.5 11/09/2019 0839   VLDL 24 04/04/2017 0935   LDLCALC 126 (H) 11/09/2019 0839   Hepatic Function Panel     Component Value Date/Time     PROT 6.2 11/09/2019 0839   PROT 6.0 02/03/2019 0920   ALBUMIN 3.8 10/09/2019 1122   AST 18 11/09/2019 0839   AST 18 10/09/2019 1122   ALT 19 11/09/2019 0839   ALT  20 10/09/2019 1122   ALKPHOS 122 10/09/2019 1122   BILITOT 0.3 11/09/2019 0839   BILITOT 0.4 10/09/2019 1122   BILIDIR 0.1 12/22/2015 0001   IBILI 0.3 12/22/2015 0001      Component Value Date/Time   TSH 2.421 04/09/2019 1534   TSH 2.170 11/12/2018 0938   TSH 1.985 11/29/2014 1029      OBESITY BEHAVIORAL INTERVENTION VISIT  Today's visit was # 25   Starting weight: 262 lbs Starting date: 11/12/18 Today's weight : 261 lbs Today's date: 11/24/2019 Total lbs lost to date: 1    ASK: We discussed the diagnosis of obesity with Judieth Keens today and Raechelle agreed to give [name] permission to discuss obesity behavioral modification therapy today.  ASSESS: Rwan has the diagnosis of obesity and her BMI today is 46.25 Elonna is in the action stage of change   ADVISE: Opalene was educated on the multiple health risks of obesity as well as the benefit of weight loss to improve her health. She was advised of the need for long term treatment and the importance of lifestyle modifications to improve her current health and to decrease her risk of future health problems.  AGREE: Multiple dietary modification options and treatment options were discussed and  Basia agreed to follow the recommendations documented in the above note.  ARRANGE: Nevena was educated on the importance of frequent visits to treat obesity as outlined per CMS and USPSTF guidelines and agreed to schedule her next follow up appointment today.  Wilhemena Durie, am acting as transcriptionist for Briscoe Deutscher, DO  I have reviewed the above documentation for accuracy and completeness, and I agree with the above. Briscoe Deutscher, DO

## 2019-11-30 ENCOUNTER — Encounter (INDEPENDENT_AMBULATORY_CARE_PROVIDER_SITE_OTHER): Payer: Self-pay | Admitting: Family Medicine

## 2019-12-03 IMAGING — CT CT CHEST W/ CM
2 of 4 series · 11 of 36 positions shown, 13 images · IV contrast (iopamidol)
Comparison: Abdomen and pelvis CT, 01/29/2019. Chest radiograph,
02/01/2017.

CLINICAL DATA: History reports abnormal CT scan of the abdomen
pelvis, 01/29/2019 with liver lesions. No liver lesions were noted
on that exam, but there are numerous sclerotic bone lesions.
Questionable metastatic disease. No current history of carcinoma.

EXAM:
CT CHEST WITH CONTRAST
TECHNIQUE: Multidetector CT imaging of the chest was performed during
intravenous contrast administration.
CONTRAST:  75mL 2IBR9M-1DD IOPAMIDOL (2IBR9M-1DD) INJECTION 61%

[Series 2: chest 2.00 br40 s3 ax · axial · 0.69mm/px · z∈[+1512,+1782]mm · 8 of 161 slices shown, 10 images]
[im 13/161  mediastinal]
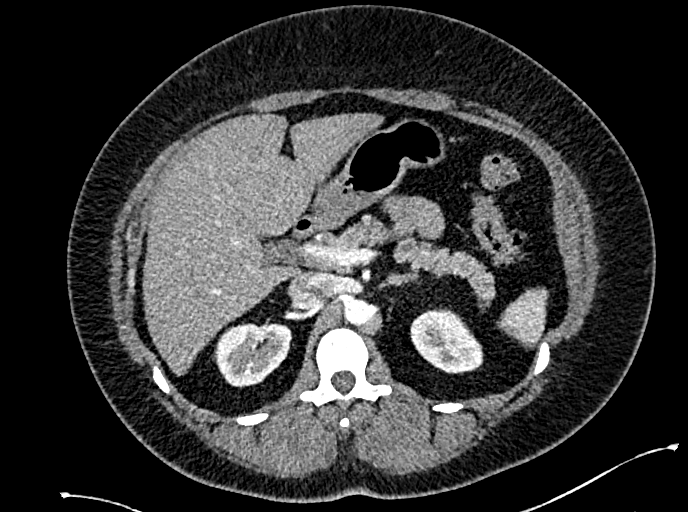
[im 13/161  lung]
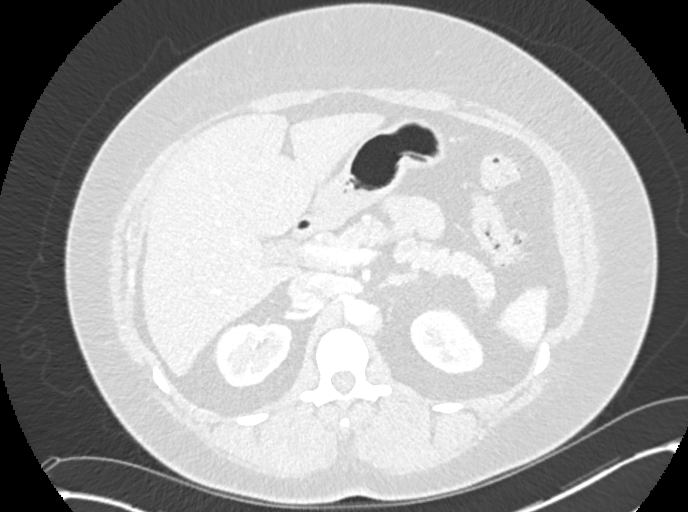
[im 37/161  lung]
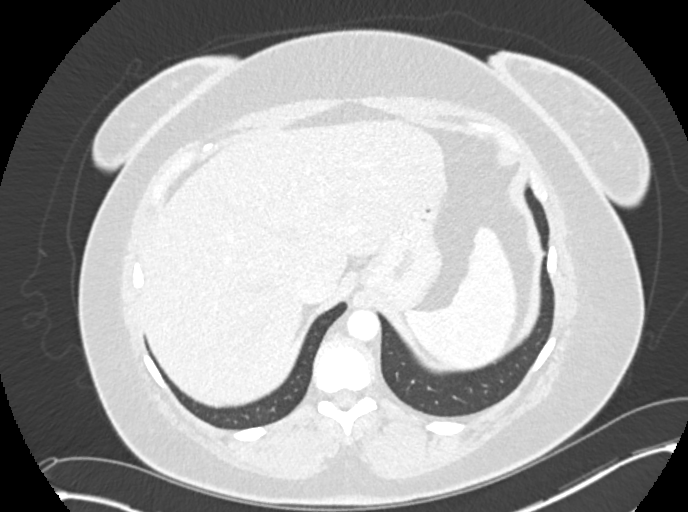
[im 50/161  lung]
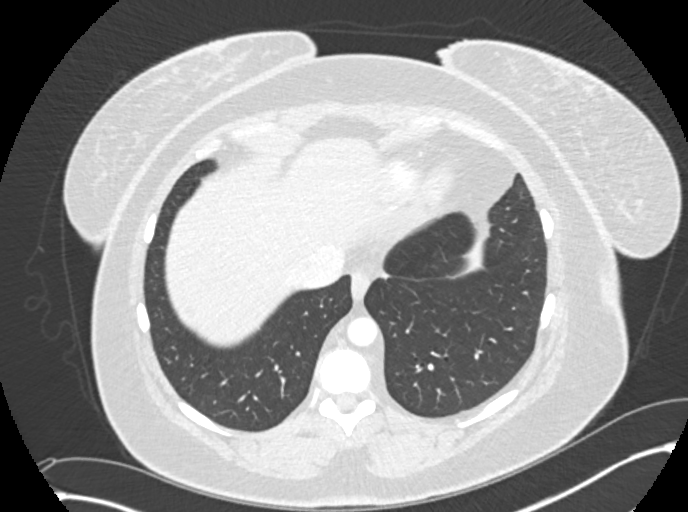
[im 74/161  lung]
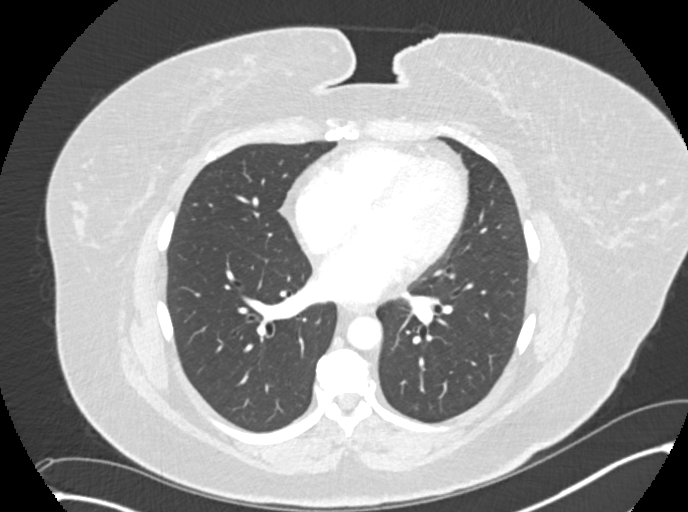
[im 87/161  mediastinal]
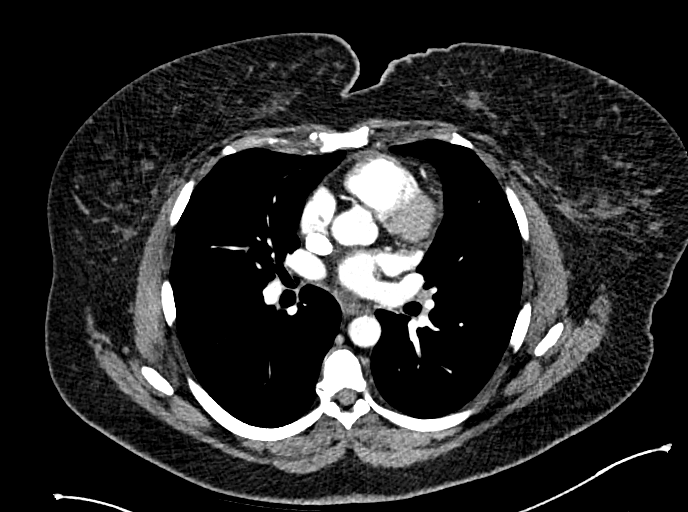
[im 87/161  lung]
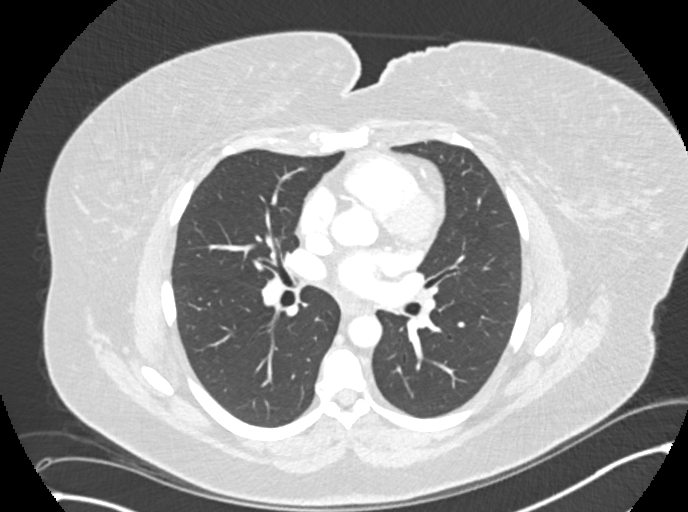
[im 111/161  lung]
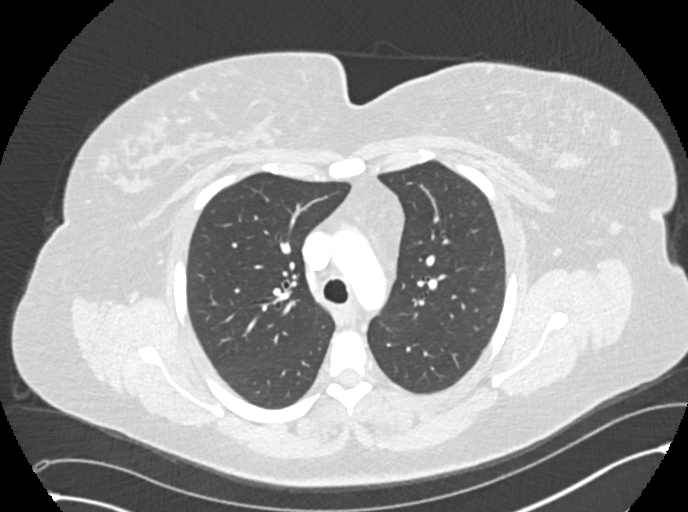
[im 124/161  lung]
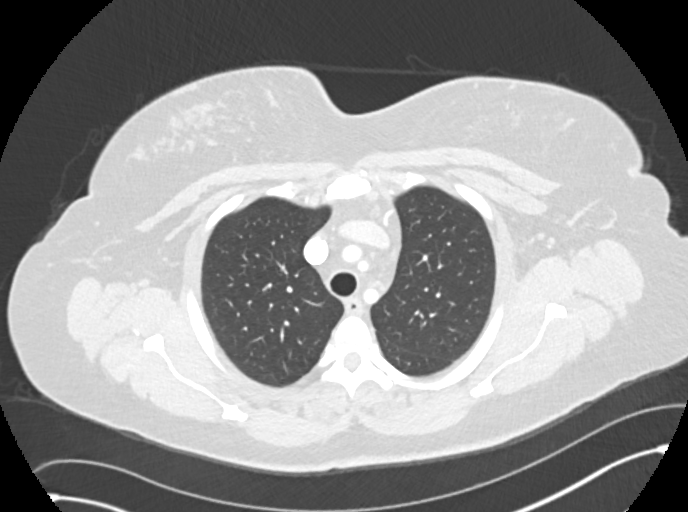
[im 148/161  lung]
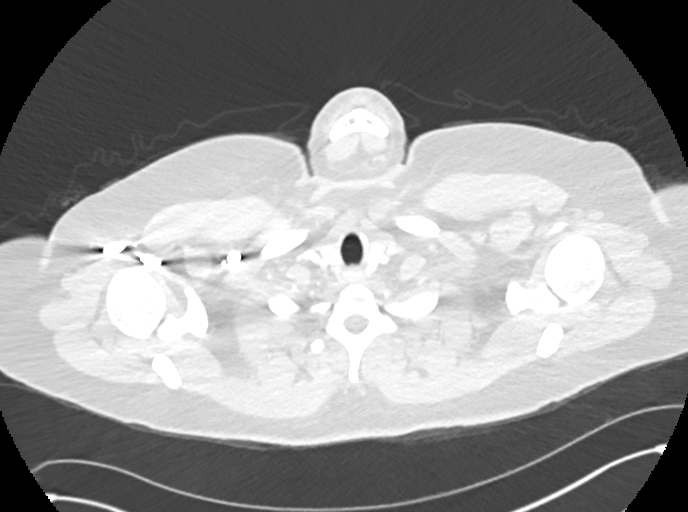

[Series 4: chest 2.00 br40 s3 cor · coronal · 0.63mm/px · 3 of 175 slices shown]
[im 35/175  lung]
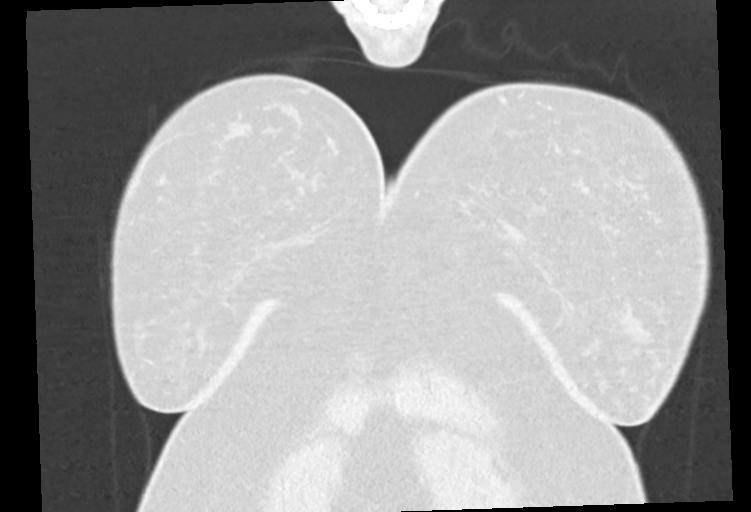
[im 70/175  lung]
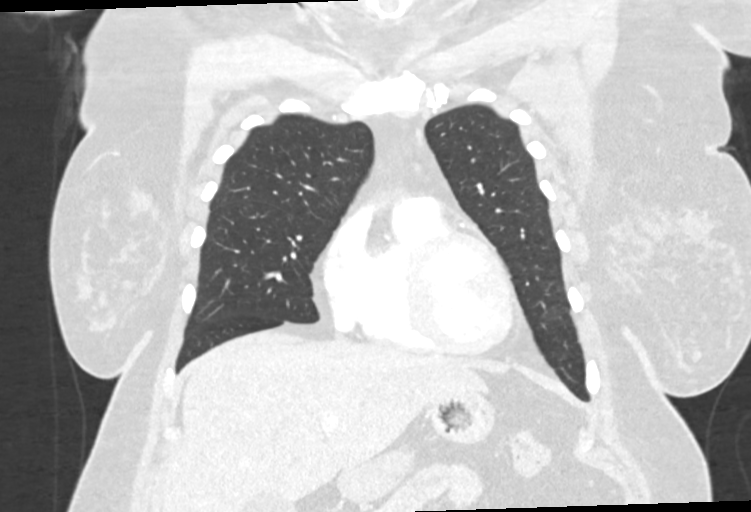
[im 105/175  lung]
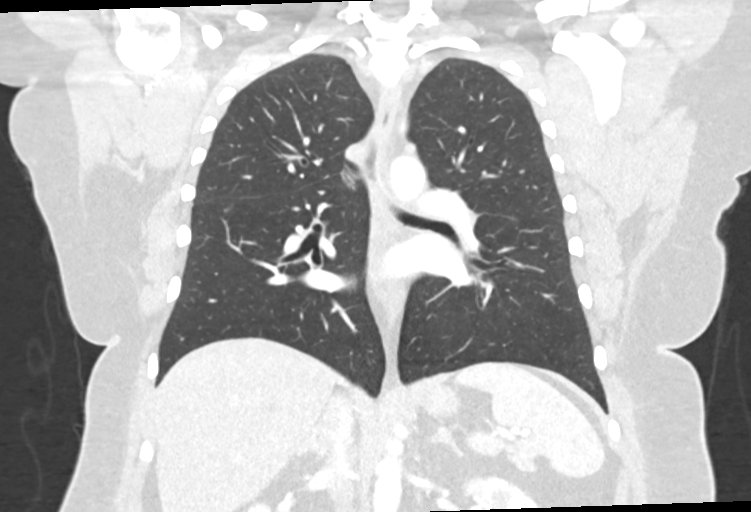

[11 of 36 positions shown; findings below may reference images not displayed]

FINDINGS: Cardiovascular: Heart is normal in size and configuration. No
pericardial effusion. No coronary artery calcifications. The great
vessels normal caliber. No aortic atherosclerosis. Branch vessels
are widely patent.

Mediastinum/Nodes: No enlarged mediastinal, hilar, or axillary lymph
nodes. Thyroid gland, trachea, and esophagus demonstrate no
significant findings.

Lungs/Pleura: Minimal left upper lobe lingula and right middle lobe
scarring. Lungs otherwise clear. No pleural effusion or
pneumothorax. No lung mass or nodule.

Upper Abdomen: Unremarkable.

Musculoskeletal: Numerous sclerotic bone lesions are noted most
evident along the spine and sternum. No osteolytic lesions.

Small left breast mass measuring 15 mm. Although this has benign
morphology, there are no prior mammograms on the imaging timeline.
Recommend follow-up diagnostic mammography and possible ultrasound
for further assessment.
IMPRESSION: 1. No lung masses or nodules.  No acute findings.
2. 15 mm left breast mass. Although this is likely a benign lesion
such as a fibroadenoma, in light of the skeletal lesions, diagnostic
mammography with possible left breast ultrasound is recommended.
3. Multiple sclerotic bone lesions consistent with metastatic
disease.

## 2019-12-15 ENCOUNTER — Ambulatory Visit (INDEPENDENT_AMBULATORY_CARE_PROVIDER_SITE_OTHER): Payer: BC Managed Care – PPO | Admitting: Family Medicine

## 2019-12-15 ENCOUNTER — Other Ambulatory Visit: Payer: Self-pay

## 2019-12-15 ENCOUNTER — Encounter (INDEPENDENT_AMBULATORY_CARE_PROVIDER_SITE_OTHER): Payer: Self-pay | Admitting: Family Medicine

## 2019-12-15 VITALS — BP 134/76 | HR 84 | Temp 97.8°F | Ht 63.0 in | Wt 258.0 lb

## 2019-12-15 DIAGNOSIS — Z9189 Other specified personal risk factors, not elsewhere classified: Secondary | ICD-10-CM

## 2019-12-15 DIAGNOSIS — E559 Vitamin D deficiency, unspecified: Secondary | ICD-10-CM

## 2019-12-15 DIAGNOSIS — Z6841 Body Mass Index (BMI) 40.0 and over, adult: Secondary | ICD-10-CM

## 2019-12-15 DIAGNOSIS — R609 Edema, unspecified: Secondary | ICD-10-CM

## 2019-12-15 MED ORDER — SPIRONOLACTONE 50 MG PO TABS: 50.0000 mg | ORAL_TABLET | Freq: Every day | ORAL | 0 refills | Status: DC

## 2019-12-16 LAB — BASIC METABOLIC PANEL
BUN/Creatinine Ratio: 17 (ref 9–23)
BUN: 17 mg/dL (ref 6–24)
CO2: 22 mmol/L (ref 20–29)
Calcium: 9.7 mg/dL (ref 8.7–10.2)
Chloride: 101 mmol/L (ref 96–106)
Creatinine, Ser: 0.98 mg/dL (ref 0.57–1.00)
GFR calc Af Amer: 77 mL/min/{1.73_m2} (ref >59)
GFR calc non Af Amer: 67 mL/min/{1.73_m2} (ref >59)
Glucose: 83 mg/dL (ref 65–99)
Potassium: 4.7 mmol/L (ref 3.5–5.2)
Sodium: 136 mmol/L (ref 134–144)

## 2019-12-17 ENCOUNTER — Other Ambulatory Visit: Payer: Self-pay | Admitting: Internal Medicine

## 2019-12-17 ENCOUNTER — Encounter (INDEPENDENT_AMBULATORY_CARE_PROVIDER_SITE_OTHER): Payer: Self-pay | Admitting: Family Medicine

## 2019-12-17 DIAGNOSIS — R609 Edema, unspecified: Secondary | ICD-10-CM | POA: Insufficient documentation

## 2019-12-17 DIAGNOSIS — B20 Human immunodeficiency virus [HIV] disease: Secondary | ICD-10-CM

## 2019-12-17 NOTE — Progress Notes (Signed)
Office: [phone]  /  Fax: [phone]   HPI:  Chief Complaint: OBESITY Margaret Porter is here to discuss her progress with her obesity treatment plan. She is on the follow the Category 3 plan and states she is following her eating plan approximately 85 % of the time. She states she is walking 60 minutes 3 to 4 times per week.  Margaret Porter is skipping fewer meals. She has tended to skip breakfast or lunch but is working on this.. She is focusing on protein at all meals and she is trying to increase her vegetable intake. Margaret Porter does not like a lot of vegetables.  Margaret Porter has Margaret and Spironolactone was started at the last visit. She does report lower extremity Margaret, which she feels has improved with Spironolactone. Margaret Porter reports occasional dizziness. She admits to not drinking enough water.  Vitamin D deficiency Margaret Porter has a diagnosis of vitamin D deficiency. Her vitamin D level is at goal (82 on 10/09/19). She is still taking prescription vitamin D 50,000 IU every 3 days. Margaret Porter denies nausea, vomiting or muscle weakness.    At risk for osteopenia and osteoporosis Margaret Porter is at higher risk of osteopenia and osteoporosis due to vitamin D deficiency.   Today's visit was # 26  Starting weight: 262 lbs Starting date: 11/12/2018 Today's weight : 258 lbs Today's date: 12/15/2019 Total lbs lost to date: 4 Total lbs lost since last in-office visit: 3  ASSESSMENT AND PLAN:  Margaret, unspecified type - Plan: Basic Metabolic Panel (BMET), spironolactone (ALDACTONE) 50 MG tablet  Vitamin D deficiency  At risk for osteoporosis  Class 3 severe obesity with serious comorbidity and body mass index (BMI) of 45.0 to 49.9 in adult, unspecified obesity type ([place])  PLAN:  Margaret Porter agrees to continue Spironolactone 50 mg daily #30 with no refills and follow up as directed. We will check BMET today.  Vitamin D Deficiency Low vitamin D level contributes to fatigue and are  associated with obesity, breast, and colon cancer. [name] agrees to decrease the dose of prescription vitamin D and change to taking prescription Vit D @50 ,000 IU weekly (no prescription refill needed). She will follow up for routine testing of vitamin D, at least 2-3 times per year to avoid over-replacement.  At risk for osteopenia and osteoporosis Margaret Porter was given extended  (15 minutes) osteoporosis prevention counseling today. Margaret Porter is at risk for osteopenia and osteoporsis due to her vitamin D deficiency. She was encouraged to take her vitamin D and follow her higher calcium diet and increase strengthening exercise to help strengthen her bones and decrease her risk of osteopenia and osteoporosis.   Obesity Margaret Porter is currently in the action stage of change. As such, her goal is to continue with weight loss efforts She has agreed to follow the Category 3 plan Margaret Porter will continue walking for 60 minutes, 3 to 4 times per week for weight loss and overall health benefits. We discussed the following Behavioral Modification Strategies today: planning for success, no skipping meals and increasing lean protein intake  Margaret Porter has agreed to follow up with our clinic in 3 weeks. She was informed of the importance of frequent follow up visits to maximize her success with intensive lifestyle modifications for her multiple health conditions.  ALLERGIES: Allergies  Allergen Reactions  . Molds & Smuts     SOB, chest congestion  . Oxycodone     Questionable mouth itching, sweats, but was also simultaneously on Cipro, Flagyl, and Robaxin.  [name] Kitchen Red Dye  Caused a rash with large amounts  . Wellbutrin [Bupropion] Other (See Comments)    Eyes swelled 3 hours after taking one dose  . Amoxicillin Rash  . Aspirin Anxiety  . Sulfamethoxazole-Trimethoprim Rash    MEDICATIONS: Current Outpatient Medications on File Prior to Visit  Medication Sig Dispense Refill  . diphenhydrAMINE (BENADRYL)  25 MG tablet Take 25 mg by mouth every 6 (six) hours as needed for itching or allergies. Reported on 02/29/2016    . Vitamin D, Ergocalciferol, (DRISDOL) 1.25 MG (50000 UT) CAPS capsule TAKE 1 CAPSULE (50,000 UNITS TOTAL) BY MOUTH 2 (TWO) TIMES A WEEK. (Patient taking differently: Take 50,000 Units by mouth every 7 (seven) days. ) 24 capsule 1   No current facility-administered medications on file prior to visit.    PAST MEDICAL HISTORY: Past Medical History:  Diagnosis Date  . Allergic rhinitis   . Allergy   . Anemia   . Food allergy   . HIV infection ([place]) 2006  . Idiopathic urticaria   . Joint pain   . Lytic bone lesions on xray    bone lesions on hip,sternum and spine  . Obesity   . Pre-diabetes    pt not on meds  . Rosacea   . Sickle cell anemia (HCC)    Sickle cell trait with mom  . Swelling of both lower extremities    per pt, left side is more swollen  . Vitamin D deficiency     PAST SURGICAL HISTORY: Past Surgical History:  Procedure Laterality Date  . APPENDECTOMY    . DILATATION & CURRETTAGE/HYSTEROSCOPY WITH RESECTOCOPE N/A 06/27/2015   Procedure: DILATATION & CURETTAGE/HYSTEROSCOPY WITH RESECTOCOPE;  Surgeon: Servando Salina, MD;  Location: [facility];  Service: Gynecology;  Laterality: N/A;  . LAPAROSCOPIC APPENDECTOMY N/A 09/07/2017   Procedure: APPENDECTOMY LAPAROSCOPIC;  Surgeon: Clovis Riley, MD;  Location: [facility];  Service: General;  Laterality: N/A;    SOCIAL HISTORY: Social History   Tobacco Use  . Smoking status: Never Smoker  . Smokeless tobacco: Never Used  Substance Use Topics  . Alcohol use: Yes    Alcohol/week: 0.0 standard drinks    Comment: occasional (special occasions)  . Drug use: No    FAMILY HISTORY: Family History  Problem Relation Age of Onset  . Diabetes Mother   . Diabetes Brother   . Heart disease Paternal Uncle   . Heart disease Maternal Grandmother   . Cancer Maternal Grandmother   . Heart disease Paternal Grandmother    . Diabetes Father   . High blood pressure Father   . Colon cancer Neg Hx   . Colon polyps Neg Hx   . Esophageal cancer Neg Hx   . Rectal cancer Neg Hx   . Stomach cancer Neg Hx     ROS: Review of Systems  Constitutional: Positive for weight loss.  Gastrointestinal: Negative for nausea and vomiting.  Musculoskeletal:       Positive for Margaret  Neurological: Positive for dizziness.    PHYSICAL EXAM: Blood pressure 134/76, pulse 84, temperature 97.8 F (36.6 C), temperature source Oral, height 5\' 3"  (1.6 m), weight 258 lb (117 kg), SpO2 95 %. Body mass index is 45.7 kg/m. Physical Exam Vitals reviewed.  Constitutional:      General: She is not in acute distress.    Appearance: Normal appearance. She is well-developed. She is obese.  Cardiovascular:     Rate and Rhythm: Normal rate.  Pulmonary:     Effort: Pulmonary effort is  normal.  Musculoskeletal:        General: Normal range of motion.     Right lower leg: Margaret present.     Left lower leg: Margaret present.  Skin:    General: Skin is warm and dry.  Neurological:     Mental Status: She is alert and oriented to person, place, and time.  Psychiatric:        Mood and Affect: Mood normal.        Behavior: Behavior normal.     RECENT LABS AND TESTS: BMET    Component Value Date/Time   NA 136 12/15/2019 1341   K 4.7 12/15/2019 1341   CL 101 12/15/2019 1341   CO2 22 12/15/2019 1341   GLUCOSE 83 12/15/2019 1341   GLUCOSE 97 11/09/2019 0839   BUN 17 12/15/2019 1341   CREATININE 0.98 12/15/2019 1341   CREATININE 1.00 11/09/2019 0839   CALCIUM 9.7 12/15/2019 1341   CALCIUM 9.6 04/09/2019 1534   GFRNONAA 67 12/15/2019 1341   GFRNONAA >60 10/09/2019 1122   GFRNONAA 71 04/03/2019 0913   GFRAA 77 12/15/2019 1341   GFRAA >60 10/09/2019 1122   GFRAA 82 04/03/2019 0913   Lab Results  Component Value Date   HGBA1C 5.0 06/29/2019   HGBA1C 5.3 12/02/2018   HGBA1C 5.4 11/12/2018   HGBA1C 5.3 02/14/2018   HGBA1C 5.1  04/04/2017   Lab Results  Component Value Date   INSULIN 18.2 06/29/2019   INSULIN 21.1 11/12/2018   CBC    Component Value Date/Time   WBC 5.1 11/09/2019 0839   RBC 4.56 11/09/2019 0839   HGB 14.1 11/09/2019 0839   HGB 14.1 11/12/2018 0938   HCT 42.9 11/09/2019 0839   HCT 42.9 11/12/2018 0938   PLT 236 11/09/2019 0839   PLT 257 11/12/2018 0938   MCV 94.1 11/09/2019 0839   MCV 94 11/12/2018 0938   MCH 30.9 11/09/2019 0839   MCHC 32.9 11/09/2019 0839   RDW 13.0 11/09/2019 0839   RDW 13.2 11/12/2018 0938   LYMPHSABS 1,515 11/09/2019 0839   LYMPHSABS 0.9 11/12/2018 0938   MONOABS 0.5 10/09/2019 1122   EOSABS 41 11/09/2019 0839   EOSABS 0.0 11/12/2018 0938   BASOSABS 10 11/09/2019 0839   BASOSABS 0.0 11/12/2018 0938   Iron/TIBC/Ferritin/ %Sat No results found for: IRON, TIBC, FERRITIN, IRONPCTSAT Lipid Panel     Component Value Date/Time   CHOL 196 11/09/2019 0839   TRIG 143 11/09/2019 0839   HDL 44 (L) 11/09/2019 0839   CHOLHDL 4.5 11/09/2019 0839   VLDL 24 04/04/2017 0935   LDLCALC 126 (H) 11/09/2019 0839   Hepatic Function Panel     Component Value Date/Time   PROT 6.2 11/09/2019 0839   PROT 6.0 02/03/2019 0920   ALBUMIN 3.8 10/09/2019 1122   AST 18 11/09/2019 0839   AST 18 10/09/2019 1122   ALT 19 11/09/2019 0839   ALT 20 10/09/2019 1122   ALKPHOS 122 10/09/2019 1122   BILITOT 0.3 11/09/2019 0839   BILITOT 0.4 10/09/2019 1122   BILIDIR 0.1 12/22/2015 0001   IBILI 0.3 12/22/2015 0001      Component Value Date/Time   TSH 2.421 04/09/2019 1534   TSH 2.170 11/12/2018 0938   TSH 1.985 11/29/2014 1029     Ref. Range 10/09/2019 11:22  Vitamin D, 25-Hydroxy Latest Ref Range: 30 - 100 ng/mL 82.82    I, Doreene Nest, am acting as Location manager for [employer], FNP-C  I have reviewed the above documentation  for accuracy and completeness, and I agree with the above.  - Samaad Hashem, FNP-C.

## 2020-01-07 ENCOUNTER — Ambulatory Visit (INDEPENDENT_AMBULATORY_CARE_PROVIDER_SITE_OTHER): Payer: BC Managed Care – PPO | Admitting: Family Medicine

## 2020-01-07 ENCOUNTER — Other Ambulatory Visit: Payer: Self-pay

## 2020-01-07 ENCOUNTER — Encounter (INDEPENDENT_AMBULATORY_CARE_PROVIDER_SITE_OTHER): Payer: Self-pay | Admitting: Family Medicine

## 2020-01-07 VITALS — BP 127/83 | HR 78 | Temp 98.1°F | Ht 63.0 in | Wt 262.0 lb

## 2020-01-07 DIAGNOSIS — Z6841 Body Mass Index (BMI) 40.0 and over, adult: Secondary | ICD-10-CM

## 2020-01-07 DIAGNOSIS — E8881 Metabolic syndrome: Secondary | ICD-10-CM

## 2020-01-11 ENCOUNTER — Ambulatory Visit: Payer: BC Managed Care – PPO | Admitting: Medical

## 2020-01-11 ENCOUNTER — Other Ambulatory Visit: Payer: Self-pay

## 2020-01-11 ENCOUNTER — Encounter: Payer: Self-pay | Admitting: Medical

## 2020-01-11 VITALS — BP 130/84 | HR 103 | Temp 97.8°F | Ht 63.0 in | Wt 266.0 lb

## 2020-01-11 DIAGNOSIS — Q782 Osteopetrosis: Secondary | ICD-10-CM

## 2020-01-11 DIAGNOSIS — E6609 Other obesity due to excess calories: Secondary | ICD-10-CM | POA: Diagnosis not present

## 2020-01-11 DIAGNOSIS — Z0000 Encounter for general adult medical examination without abnormal findings: Secondary | ICD-10-CM | POA: Diagnosis not present

## 2020-01-11 DIAGNOSIS — E2839 Other primary ovarian failure: Secondary | ICD-10-CM | POA: Insufficient documentation

## 2020-01-11 DIAGNOSIS — B20 Human immunodeficiency virus [HIV] disease: Secondary | ICD-10-CM

## 2020-01-11 DIAGNOSIS — E8881 Metabolic syndrome: Secondary | ICD-10-CM

## 2020-01-11 DIAGNOSIS — N3941 Urge incontinence: Secondary | ICD-10-CM

## 2020-01-11 DIAGNOSIS — E785 Hyperlipidemia, unspecified: Secondary | ICD-10-CM

## 2020-01-11 DIAGNOSIS — E559 Vitamin D deficiency, unspecified: Secondary | ICD-10-CM

## 2020-01-11 DIAGNOSIS — Z7189 Other specified counseling: Secondary | ICD-10-CM

## 2020-01-11 DIAGNOSIS — R609 Edema, unspecified: Secondary | ICD-10-CM

## 2020-01-11 DIAGNOSIS — N951 Menopausal and female climacteric states: Secondary | ICD-10-CM

## 2020-01-11 DIAGNOSIS — L501 Idiopathic urticaria: Secondary | ICD-10-CM

## 2020-01-11 DIAGNOSIS — Z7185 Encounter for immunization safety counseling: Secondary | ICD-10-CM

## 2020-01-11 NOTE — Progress Notes (Signed)
Subjective:   HPI  Margaret Porter is a 50 y.o. female who presents for Chief Complaint  Patient presents with  . Annual Exam    with fasting labs     Medical care team includes: Tianna Baus, Camelia Eng, PA-C here for primary care Dentist, [facility] Dr. Garwin Brothers with [facility] Dr. Owens Shark with weight management clinic [facility] Dr. Michel Bickers with infectious disease  Concerns: She notes some frequency with urination but less than 14 times per day.  She does get some urgency and has to get to the bathroom quickly with urination.  No burning with urination, no blood in the urine, no fever no nausea or vomiting.  No vaginal discharge.  She did discuss with gynecology at her physical this past year but does not recall how they left the discussion  She has questions about the Covid vaccine.  She recalls vaguely some question about getting sick with the MMR vaccine but no other vaccine reaction in the past.  However she notes getting the MMR vaccine several more times than you are supposed due to errors in recordkeeping when she was a child.  She is compliant with her HIV medication, involved in HIV study still.  Sees infectious disease regularly  She still sees weight management clinic regularly  She had a crusted skin lesion on her right upper eyelid.  She saw her eye doctor for this a few months ago when it was still crusted.  The eye doctor did not seem too concerned.  Reviewed their medical, surgical, family, social, medication, and allergy history and updated chart as appropriate.  Past Medical History:  Diagnosis Date  . Allergic rhinitis   . Allergy   . Food allergy   . HIV infection ([place]) 2006  . Idiopathic urticaria   . Joint pain   . Lytic bone lesions on xray    bone lesions on hip,sternum and spine  . Obesity   . Pre-diabetes    pt not on meds  . Rosacea   . Sickle cell anemia (HCC)    Sickle cell trait with mom  . Swelling  of both lower extremities    per pt, left side is more swollen  . Vitamin D deficiency     Past Surgical History:  Procedure Laterality Date  . COLONOSCOPY  03/09/2019   diverticulosis sigmoid, int hemorrhoids, Dr. Harl Bowie  . DILATATION & CURRETTAGE/HYSTEROSCOPY WITH RESECTOCOPE N/A 06/27/2015   Procedure: DILATATION & CURETTAGE/HYSTEROSCOPY WITH RESECTOCOPE;  Surgeon: Servando Salina, MD;  Location: [facility];  Service: Gynecology;  Laterality: N/A;  . LAPAROSCOPIC APPENDECTOMY N/A 09/07/2017   Procedure: APPENDECTOMY LAPAROSCOPIC;  Surgeon: Clovis Riley, MD;  Location: [facility];  Service: General;  Laterality: N/A;    Social History   Socioeconomic History  . Marital status: Single    Spouse name: Not on file  . Number of children: Not on file  . Years of education: Not on file  . Highest education level: Not on file  Occupational History  . Occupation: Art therapist  Tobacco Use  . Smoking status: Never Smoker  . Smokeless tobacco: Never Used  Substance and Sexual Activity  . Alcohol use: Yes    Alcohol/week: 0.0 standard drinks    Comment: occasional (special occasions)  . Drug use: No  . Sexual activity: Never    Partners: Male    Birth control/protection: Condom    Comment: given condoms  Other Topics Concern  . 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   Single, no children   Social Determinants of Health   Financial Resource Strain:   . Difficulty of Paying Living Expenses: Not on file  Food Insecurity:   . Worried About Charity fundraiser in the Last Year: Not on file  . Ran Out of Food in the Last Year: Not on file  Transportation Needs:   . Lack of Transportation (Medical): Not on file  . Lack of Transportation (Non-Medical): Not on file  Physical Activity:   . Days of Exercise per Week: Not on file  . Minutes of Exercise per Session: Not on file  Stress:   . Feeling of Stress : Not on  file  Social Connections:   . Frequency of Communication with Friends and Family: Not on file  . Frequency of Social Gatherings with Friends and Family: Not on file  . Attends Religious Services: Not on file  . Active Member of Clubs or Organizations: Not on file  . Attends Archivist Meetings: Not on file  . Marital Status: Not on file  Intimate Partner Violence:   . Fear of Current or Ex-Partner: Not on file  . Emotionally Abused: Not on file  . Physically Abused: Not on file  . Sexually Abused: Not on file    Family History  Problem Relation Age of Onset  . Diabetes Mother   . Diabetes Brother   . Heart disease Paternal Uncle   . Heart disease Maternal Grandmother   . Cancer Maternal Grandmother        breast  . Heart disease Paternal Grandmother   . Cancer Paternal Grandmother        breast  . Diabetes Father   . High blood pressure Father   . Colon cancer Neg Hx   . Colon polyps Neg Hx   . Esophageal cancer Neg Hx   . Rectal cancer Neg Hx   . Stomach cancer Neg Hx      Current Outpatient Medications:  .  diphenhydrAMINE (BENADRYL) 25 MG tablet, Take 25 mg by mouth every 6 (six) hours as needed for itching or allergies. Reported on 02/29/2016, Disp: , Rfl:  .  GENVOYA 150-150-200-10 MG TABS tablet, TAKE ONE TABLET BY MOUTH ONCE DAILY WITH BREAKFAST. STORE IN ORIGINALCONTAINER AT ROOM TEMPERATURE., Disp: 30 tablet, Rfl: 5 .  nystatin-triamcinolone ointment (MYCOLOG), APPLY TO AFFECTED AREA 2X A DAY FOR 10 DAYS AS NEEDED, Disp: , Rfl:  .  spironolactone (ALDACTONE) 50 MG tablet, Take 1 tablet (50 mg total) by mouth daily., Disp: 30 tablet, Rfl: 0 .  Vitamin D, Ergocalciferol, (DRISDOL) 1.25 MG (50000 UT) CAPS capsule, Take 50,000 Units by mouth every 7 (seven) days., Disp: , Rfl:   Allergies  Allergen Reactions  . Molds & Smuts     SOB, chest congestion  . Oxycodone     Questionable mouth itching, sweats, but was also simultaneously on Cipro, Flagyl, and  Robaxin.  [name] Kitchen Red Dye     Caused a rash with large amounts  . Wellbutrin [Bupropion] Other (See Comments)    Eyes swelled 3 hours after taking one dose  . Amoxicillin Rash  . Aspirin Anxiety  . Sulfamethoxazole-Trimethoprim Rash    Review of Systems Constitutional: -fever, -chills, -sweats, -unexpected weight change, -decreased appetite, -fatigue Allergy: -sneezing, -itching, -congestion Dermatology: -changing moles, --rash, -lumps ENT: -runny nose, -ear pain, -sore throat, -hoarseness, -sinus pain, -teeth pain, -  ringing in ears, -hearing loss, -nosebleeds Cardiology: -chest pain, -palpitations, -swelling, -difficulty breathing when lying flat, -waking up short of breath Respiratory: -cough, -shortness of breath, -difficulty breathing with exercise or exertion, -wheezing, -coughing up blood Gastroenterology: -abdominal pain, -nausea, -vomiting, -diarrhea, -constipation, -blood in stool, -changes in bowel movement, -difficulty swallowing or eating Hematology: -bleeding, -bruising  Musculoskeletal: -joint aches, -muscle aches, -joint swelling, -back pain, -neck pain, -cramping, -changes in gait Ophthalmology: denies vision changes, eye redness, itching, discharge Urology: -burning with urination, -difficulty urinating, -blood in urine, +urinary frequency, -urgency,+mild incontinence Neurology: -headache, -weakness, -tingling, -numbness, -memory loss, -falls, -dizziness Psychology: -depressed mood, -agitation, -sleep problems Breast/gyn: -breast tenderness, -discharge, -lumps, -vaginal discharge,- irregular periods, -heavy periods     Objective:  BP 130/84   Pulse (!) 103   Temp 97.8 F (36.6 C)   Ht 5' 3"  (1.6 m)   Wt 266 lb (120.7 kg)   LMP  (LMP Unknown)   SpO2 96%   BMI 47.12 kg/m   General appearance: alert, no distress, WD/WN, African American female Skin: Right upper eyelid midline with a brownish somewhat roundish flat 2 mm x 3 mm lesion, nonspecific scattered macules, no  worrisome lesions HEENT: normocephalic, conjunctiva/corneas normal, sclerae anicteric, PERRLA, EOMi, nares patent, no discharge or erythema, pharynx normal Oral cavity: MMM, tongue normal, teeth normal Neck: supple, no lymphadenopathy, no thyromegaly, no masses, normal ROM, no bruits Chest: non tender, normal shape and expansion Heart: RRR, normal S1, S2, no murmurs Lungs: CTA bilaterally, no wheezes, rhonchi, or rales Abdomen: +bs, soft, +surgical port scars noted, non tender, non distended, no masses, no hepatomegaly, no splenomegaly, no bruits Back: non tender, normal ROM, no scoliosis Musculoskeletal: upper extremities non tender, no obvious deformity, normal ROM throughout, lower extremities non tender, no obvious deformity, normal ROM throughout Extremities: no edema, no cyanosis, no clubbing Pulses: 2+ symmetric, upper and lower extremities, normal cap refill Neurological: alert, oriented x 3, CN2-12 intact, strength normal upper extremities and lower extremities, sensation normal throughout, DTRs 2+ throughout, no cerebellar signs, gait normal Psychiatric: normal affect, behavior normal, pleasant  Breast/gyn/rectal - deferred to gynecology    Assessment and Plan :   Encounter Diagnoses  Name Primary?  . Routine general medical examination at a health care facility Yes  . Human immunodeficiency virus (HIV) disease ([place])   . Hyperlipidemia, unspecified hyperlipidemia type   . Class 1 obesity due to excess calories without serious comorbidity in adult, unspecified BMI   . URINARY INCONTINENCE, URGE   . Vitamin D deficiency   . Vaccine counseling   . Edema, unspecified type   . Bony sclerosis   . URTICARIA, IDIOPATHIC   . Insulin resistance   . Estrogen deficiency   . Perimenopausal      Physical exam - discussed and counseled on healthy lifestyle, diet, exercise, preventative care, vaccinations, sick and well care, proper use of emergency dept and after hours care, and  addressed their concerns.    I reviewed several labs in the chart record including comprehensive metabolic panel, CBC, lipid, STD screening, HIV viral load and CD4 counts, vitamin D, all from October November 2020.  Health screening: See your eye doctor yearly for routine vision care. See your dentist yearly for routine dental care including hygiene visits twice yearly.  Cancer screening Colonoscopy:  Reviewed colonoscopy on file that is up to date from March 2020  We will request recent Pap and mammogram from gynecology, Dr. Garwin Brothers   Vaccinations: We discussed Shingrix vaccine, Covid vaccine.  I answered her questions.  She will plan to get Covid vaccine when it becomes available soon.  She will plan to get shingles vaccine later in the spring  She is up-to-date on other vaccines, tetanus, pneumococcal, flu    Separate significant chronic issues discussed: HIV disease-compliant with therapy, reviewed recent labs, managed by infectious disease and in drug study  Obesity-continue efforts to lose weight through healthy diet, exercise, and continue management with weight management clinic  Hyperlipidemia-we spent some time discussing her recent labs, goals for therapy, possibility of statin  Urinary incontinence-discussed Kegel exercises, possible causes, other potential evaluation.  She sees gynecology.  She will start with Kegel exercises.  Vitamin D deficiency-her recent vitamin D level looks good, continue current therapy  History of edema-doing fine on current medication  Bony sclerosis-I had sent her to oncology, Dr.Gautam Kale due to abnormal findings of sclerotic bone lesions on CT scan.   Per oncology report, her abdominal pelvis CT in 2018 showed multiple foci of sclerosis in the spine and pelvic bones.   November 2019 mammogram did not show suspicious findings in the left breast but showed an indeterminate area of the right breast which was subsequently ultrasound and  biopsied which revealed fibroadenoma.  A January 2020 CT abdomen pelvis revealed numerous sclerotic osseous lesions unchanged with mild progression of a few lesions.  Dr.Kale also reviewed the chest CT and SPEP with no M spike detected.  After reviewing her labs and imaging, and after reviewing a bone scan showing no concerning lesions they advise she continue vitamin D supplement, have a bone density test in the follow-up with primary care and to return to them as needed.    Plan for bone density test   Viviann was seen today for annual exam.  Diagnoses and all orders for this visit:  Routine general medical examination at a health care facility -     DG Bone Density; Future  Human immunodeficiency virus (HIV) disease ([place])  Hyperlipidemia, unspecified hyperlipidemia type  Class 1 obesity due to excess calories without serious comorbidity in adult, unspecified BMI  URINARY INCONTINENCE, URGE  Vitamin D deficiency -     DG Bone Density; Future  Vaccine counseling  Edema, unspecified type  Bony sclerosis  URTICARIA, IDIOPATHIC  Insulin resistance -     DG Bone Density; Future  Estrogen deficiency -     DG Bone Density; Future  Perimenopausal -     DG Bone Density; Future    Follow-up pending labs, yearly for physical

## 2020-01-11 NOTE — Patient Instructions (Addendum)
It was a pleasure to see you as always.  See your dentist yearly See your eye doctor yearly See your gynecologist yearly Continue regular follow-up with HIV clinic   After reviewing your records from oncology in light of the sclerotic lesions that were found.  They recommended you continue your vitamin D supplement, and they recommended you have a bone density test to screen for osteoporosis.  If you already had this done through gynecology please let me know.  Please call to schedule your bone density test.   [facility]  [id number], [place], [place] [address]   I recommend you follow-up with [facility] dermatology given the skin lesion of the right just to make sure they do not feel like it is a concern.  It looks a little unusual to me.   I reviewed the heart disease calculator.  You currently still   Bladder concerns: We discussed bladder concerns today such as overactive bladder or incontinence.  I recommend you try using Kegel exercises as below to strengthen the pelvic floor.  This may help improve your symptoms over the next several weeks.      This is a decent Kegel exercise video I found on youtube to help.  [email or web address]    Kegel Exercises  Kegel exercises can help strengthen your pelvic floor muscles. The pelvic floor is a group of muscles that support your rectum, small intestine, and bladder. In females, pelvic floor muscles also help support the womb (uterus). These muscles help you control the flow of urine and stool. Kegel exercises are painless and simple, and they do not require any equipment. Your provider may suggest Kegel exercises to:  Improve bladder and bowel control.  Improve sexual response.  Improve weak pelvic floor muscles after surgery to remove the uterus (hysterectomy) or pregnancy (females).  Improve weak pelvic floor muscles after prostate gland removal or  surgery (males). Kegel exercises involve squeezing your pelvic floor muscles, which are the same muscles you squeeze when you try to stop the flow of urine or keep from passing gas. The exercises can be done while sitting, standing, or lying down, but it is best to vary your position. Exercises How to do Kegel exercises: 1. Squeeze your pelvic floor muscles tight. You should feel a tight lift in your rectal area. If you are a female, you should also feel a tightness in your vaginal area. Keep your stomach, buttocks, and legs relaxed. 2. Hold the muscles tight for up to 10 seconds. 3. Breathe normally. 4. Relax your muscles. 5. Repeat as told by your health care provider. Repeat this exercise daily as told by your health care provider. Continue to do this exercise for at least 4-6 weeks, or for as long as told by your health care provider. You may be referred to a physical therapist who can help you learn more about how to do Kegel exercises. Depending on your condition, your health care provider may recommend:  Varying how long you squeeze your muscles.  Doing several sets of exercises every day.  Doing exercises for several weeks.  Making Kegel exercises a part of your regular exercise routine. This information is not intended to replace advice given to you by your health care provider. Make sure you discuss any questions you have with your health care provider. Document Revised: 08/06/2018 Document Reviewed: 08/06/2018 Elsevier Patient Education  [place].

## 2020-01-12 NOTE — Progress Notes (Signed)
done

## 2020-01-12 NOTE — Progress Notes (Signed)
Chief Complaint:   Margaret Porter is here to discuss her progress with her obesity treatment plan along with follow-up of her obesity related diagnoses. Margaret Porter is on the Category 3 Plan and states she is following her eating plan approximately 75% of the time. Margaret Porter states she is walking 60 minutes 3 times per week.  Today's visit was #: 19 Starting weight: 262 lbs Starting date: 11/12/2018 Today's weight: 262 lbs Today's date: 01/07/2020 Total lbs lost to date: 0 Total lbs lost since last in-office visit: 0  Interim History: Margaret Porter is still skipping at least 1 meal a day most days. She reports eating breakfast most days. She packs a lunch about 2 days per week for work and some days goes out to eat. She would like to try a low carb plan.   Subjective:   Insulin resistance. [place] is not on metformin. No polyphagia. Last A1c was 5.0 on 06/29/2019.   Assessment/Plan:   Insulin resistance. Arial will continue her meal plan.  Class 3 severe obesity with serious comorbidity and body mass index (BMI) of 45.0 to 49.9 in adult, unspecified obesity type ([place]).  Margaret Porter is currently in the action stage of change. As such, her goal is to continue with weight loss efforts. She has agreed to following a lower carbohydrate, vegetable and lean protein rich diet plan.   She may have a [employer]iere Protein shake for one meal 1 time daily. She was advised to stop skipping meals.  Handout was given on Recipes.  We discussed the following exercise goals today: Continue current exercise regimen as noted above.  We discussed the following behavioral modification strategies today: decreasing simple carbohydrates and planning for success.  Margaret Porter has agreed to follow-up with our clinic in 2 weeks. She was informed of the importance of frequent follow-up visits to maximize her success with intensive lifestyle modifications for her multiple health conditions.    Objective:   Blood pressure 127/83, pulse 78, temperature 98.1 F (36.7 C), temperature source Oral, height 5\' 3"  (1.6 m), weight 262 lb (118.8 kg), SpO2 96 %. Body mass index is 46.41 kg/m.  General: Cooperative, alert, well developed, in no acute distress. HEENT: Conjunctivae and lids unremarkable. Neck: No thyromegaly.  Cardiovascular: Regular rhythm.  Lungs: Normal work of breathing. Extremities: No edema.  Neurologic: No focal deficits.   Lab Results  Component Value Date   CREATININE 0.98 12/15/2019   BUN 17 12/15/2019   NA 136 12/15/2019   K 4.7 12/15/2019   CL 101 12/15/2019   CO2 22 12/15/2019   Lab Results  Component Value Date   ALT 19 11/09/2019   AST 18 11/09/2019   ALKPHOS 122 10/09/2019   BILITOT 0.3 11/09/2019   Lab Results  Component Value Date   HGBA1C 5.0 06/29/2019   HGBA1C 5.3 12/02/2018   HGBA1C 5.4 11/12/2018   HGBA1C 5.3 02/14/2018   HGBA1C 5.1 04/04/2017   Lab Results  Component Value Date   INSULIN 18.2 06/29/2019   INSULIN 21.1 11/12/2018   Lab Results  Component Value Date   TSH 2.421 04/09/2019   Lab Results  Component Value Date   CHOL 196 11/09/2019   HDL 44 (L) 11/09/2019   LDLCALC 126 (H) 11/09/2019   TRIG 143 11/09/2019   CHOLHDL 4.5 11/09/2019   Lab Results  Component Value Date   WBC 5.1 11/09/2019   HGB 14.1 11/09/2019   HCT 42.9 11/09/2019   MCV 94.1 11/09/2019  PLT 236 11/09/2019   No results found for: IRON, TIBC, FERRITIN  Attestation Statements:   Reviewed by clinician on day of visit: allergies, medications, problem list, medical history, surgical history, family history, social history, and previous encounter notes.  IMichaelene Song, am acting as Location manager for [employer], FNP  I have reviewed the above documentation for accuracy and completeness, and I agree with the above. -  Georgianne Fick, FNP

## 2020-01-20 ENCOUNTER — Other Ambulatory Visit: Payer: Self-pay

## 2020-01-20 ENCOUNTER — Encounter (INDEPENDENT_AMBULATORY_CARE_PROVIDER_SITE_OTHER): Payer: Self-pay | Admitting: Family Medicine

## 2020-01-20 ENCOUNTER — Ambulatory Visit (INDEPENDENT_AMBULATORY_CARE_PROVIDER_SITE_OTHER): Payer: BC Managed Care – PPO | Admitting: Family Medicine

## 2020-01-20 VITALS — BP 121/85 | HR 69 | Temp 98.1°F | Ht 63.0 in | Wt 261.0 lb

## 2020-01-20 DIAGNOSIS — R609 Edema, unspecified: Secondary | ICD-10-CM

## 2020-01-20 DIAGNOSIS — Z6841 Body Mass Index (BMI) 40.0 and over, adult: Secondary | ICD-10-CM

## 2020-01-20 NOTE — Progress Notes (Signed)
Chief Complaint:   Margaret Porter is here to discuss her progress with her obesity treatment plan along with follow-up of her obesity related diagnoses. Margaret Porter is on the Category 3 Plan and states she is following her eating plan approximately 75% of the time. Margaret Porter states she is walking 30 minutes 3-4 times per week.  Today's visit was #: 28 Starting weight: 262 lbs Starting date: 11/12/2018 Today's weight: 261 lbs Today's date: 01/20/2020 Total lbs lost to date: 1 Total lbs lost since last in-office visit: 1  Interim History: Margaret Porter did not start the low carb eating plan as discussed last visit due to not having the necessary groceries. She still wants to try the low carb diet.  Subjective:   Margaret Porter, unspecified type. Margaret Porter was started on spironolactone for fluid retention a few months ago. She is not having any Margaret Porter in her ankles or fingers. She would like to discontinue the spironolactone. BP is normal today.  Assessment/Plan:   Margaret Porter, unspecified type. Margaret Porter will discontinue spironolactone.  Class 3 severe obesity with serious comorbidity and body mass index (BMI) of 45.0 to 49.9 in adult, unspecified obesity type (Margaret Porter).  Margaret Porter is currently in the action stage of change. As such, her goal is to continue with weight loss efforts. She has agreed to following a lower carbohydrate, vegetable and lean protein rich diet plan.   She was advised to clean out her cabinets of carbohydrates and was encouraged to eat vegetables at least 2 times per day. I also told her she must eat 3 meals per day.   Exercise goals: will continue current exercise regimen for weight loss and overall health benefits.  Behavioral modification strategies: increasing vegetables, no skipping meals and planning for success.  Margaret Porter has agreed to follow-up with our clinic in 2 weeks. She was informed of the importance of frequent follow-up visits to maximize her success with  intensive lifestyle modifications for her multiple health conditions.   Objective:   Blood pressure 121/85, pulse 69, temperature 98.1 F (36.7 C), temperature source Oral, height 5\' 3"  (1.6 m), weight 261 lb (118.4 kg), last menstrual period 10/24/2019, SpO2 94 %. Body mass index is 46.23 kg/m.  General: Cooperative, alert, well developed, in no acute distress. HEENT: Conjunctivae and lids unremarkable. Cardiovascular: Regular rhythm.  Lungs: Normal work of breathing. Neurologic: No focal deficits.   Lab Results  Component Value Date   CREATININE 0.98 12/15/2019   BUN 17 12/15/2019   NA 136 12/15/2019   K 4.7 12/15/2019   CL 101 12/15/2019   CO2 22 12/15/2019   Lab Results  Component Value Date   ALT 19 11/09/2019   AST 18 11/09/2019   ALKPHOS 122 10/09/2019   BILITOT 0.3 11/09/2019   Lab Results  Component Value Date   HGBA1C 5.0 06/29/2019   HGBA1C 5.3 12/02/2018   HGBA1C 5.4 11/12/2018   HGBA1C 5.3 02/14/2018   HGBA1C 5.1 04/04/2017   Lab Results  Component Value Date   INSULIN 18.2 06/29/2019   INSULIN 21.1 11/12/2018   Lab Results  Component Value Date   TSH 2.421 04/09/2019   Lab Results  Component Value Date   CHOL 196 11/09/2019   HDL 44 (L) 11/09/2019   LDLCALC 126 (H) 11/09/2019   TRIG 143 11/09/2019   CHOLHDL 4.5 11/09/2019   Lab Results  Component Value Date   WBC 5.1 11/09/2019   HGB 14.1 11/09/2019   HCT 42.9 11/09/2019   MCV 94.1 11/09/2019  PLT 236 11/09/2019   No results found for: IRON, TIBC, FERRITIN  Attestation Statements:   Reviewed by clinician on day of visit: allergies, medications, problem list, medical history, surgical history, family history, social history, and previous encounter notes.  Margaret Porter, am acting as Location manager for [employer], FNP   I have reviewed the above documentation for accuracy and completeness, and I agree with the above. -  Margaret Fick, FNP

## 2020-01-21 ENCOUNTER — Encounter (INDEPENDENT_AMBULATORY_CARE_PROVIDER_SITE_OTHER): Payer: Self-pay | Admitting: Family Medicine

## 2020-01-22 IMAGING — CT NUCLEAR MEDICINE PET IMAGE INITIAL (PI) SKULL BASE TO THIGH
5 series · 31 of 40 positions shown · non-contrast
Comparison: Chest CT of 01/29/2019. Of 02/11/2019. Abdominopelvic
CT

CLINICAL DATA: Initial treatment strategy for sclerotic bone
lesions. History of HIV /ids. Evaluate for primary malignancy.

EXAM:
NUCLEAR MEDICINE PET SKULL BASE TO THIGH
TECHNIQUE: 12.5 mCi F-18 FDG was injected intravenously. Full-ring PET imaging
was performed from the skull base to thigh after the radiotracer. CT
data was obtained and used for attenuation correction and anatomic
localization.
Fasting blood glucose: 88 mg/dl

[Series 3: pet sk_thigh ac · axial · 5.0mm · 4.07mm/px · z∈[-846,+90]mm · 7 of 235 slices shown]
[im 1/235]
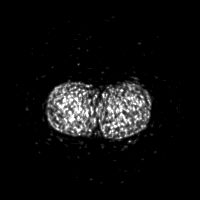
[im 30/235]
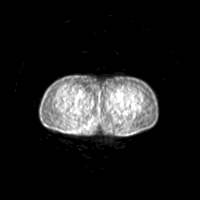
[im 88/235]
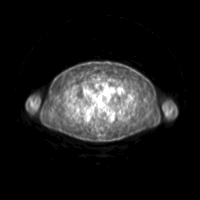
[im 118/235]
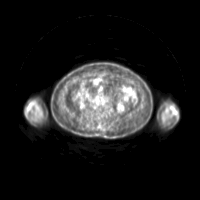
[im 147/235]
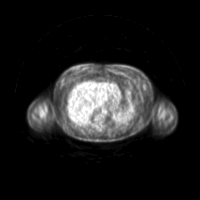
[im 205/235]
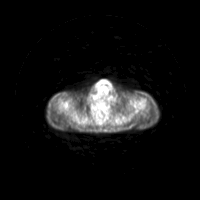
[im 235/235]
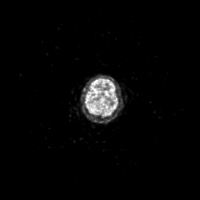

[Series 4: ct sk_thigh 5.0 hd_fov · axial · 1.22mm/px · z∈[-846,+90]mm · 7 of 235 slices shown]
[im 1/235  brain]
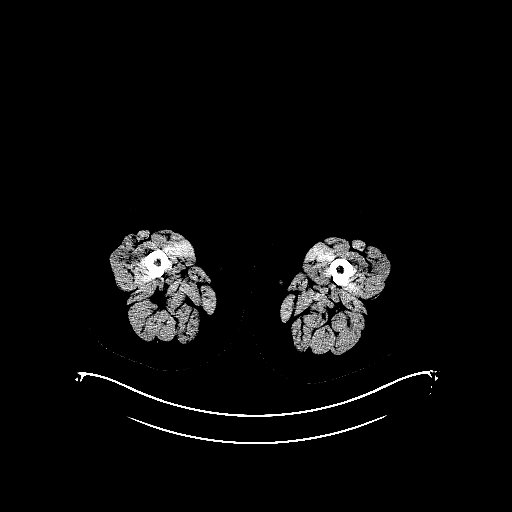
[im 30/235  brain]
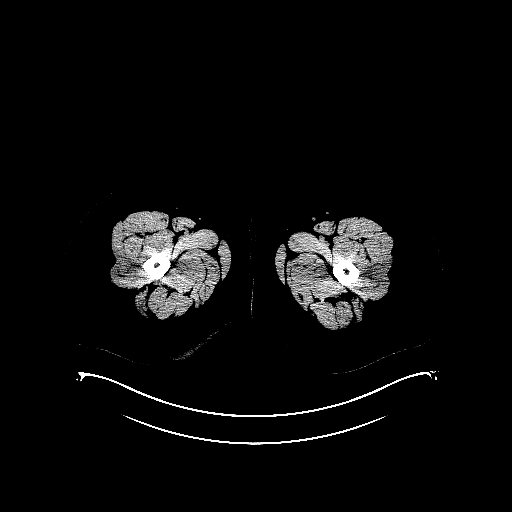
[im 88/235  brain]
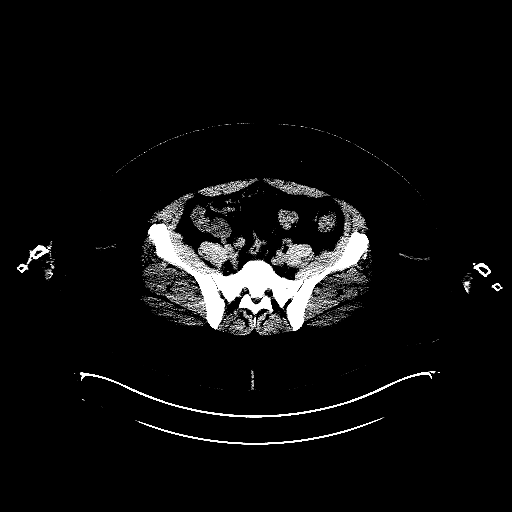
[im 118/235  brain]
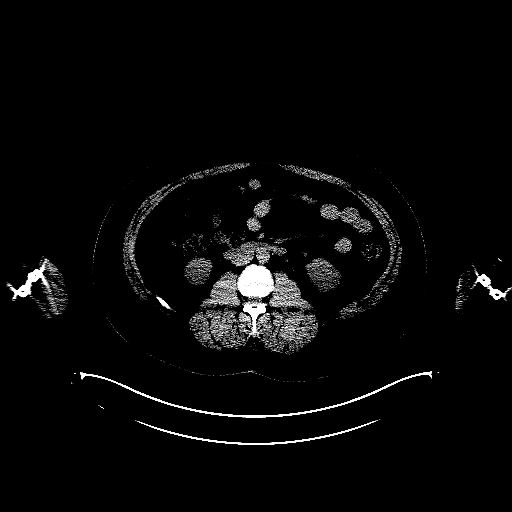
[im 147/235  brain]
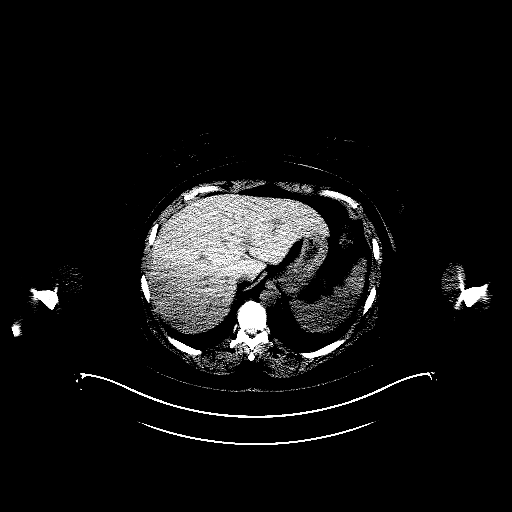
[im 205/235  brain]
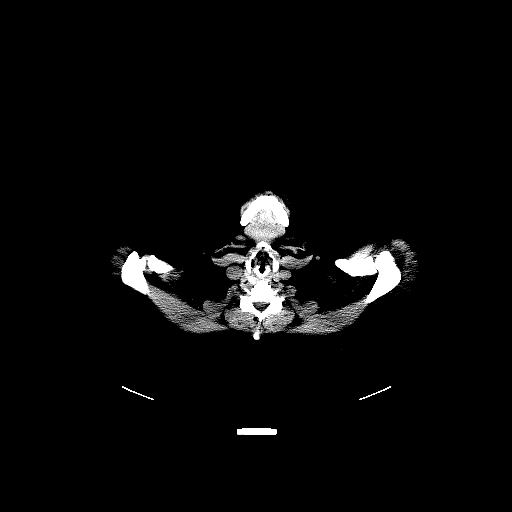
[im 235/235  brain]
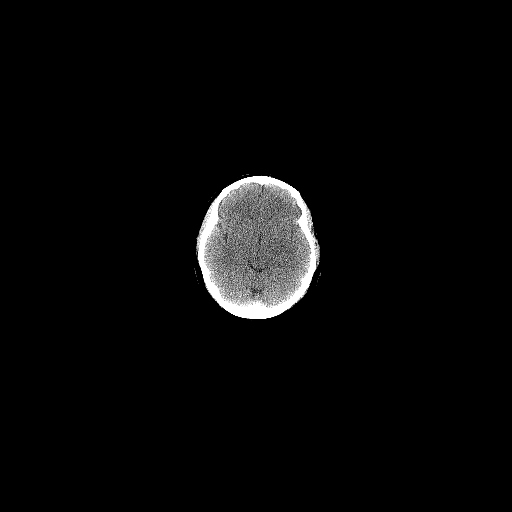

[Series 5: pet sk_thigh nac · axial · 5.0mm · 4.07mm/px · z∈[-846,+90]mm · 7 of 235 slices shown]
[im 1/235]
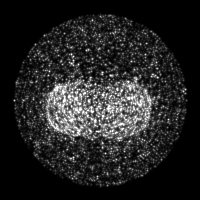
[im 59/235]
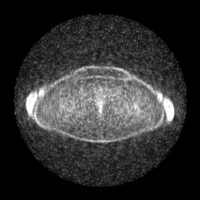
[im 88/235]
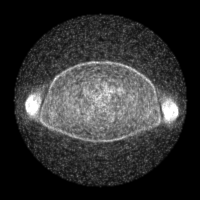
[im 118/235]
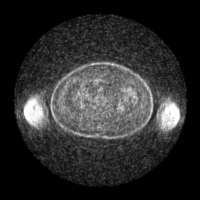
[im 147/235]
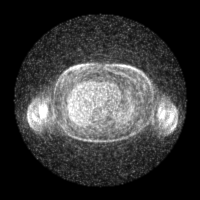
[im 205/235]
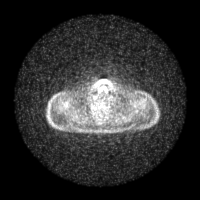
[im 235/235]
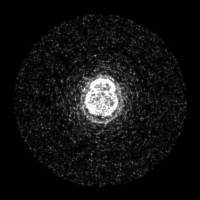

[Series 8: ct sk_thigh 5.0 b70f (id)_bone · axial · 0.80mm/px · z∈[-298,-42]mm · 2 of 65 slices shown]
[im 1/65  brain]
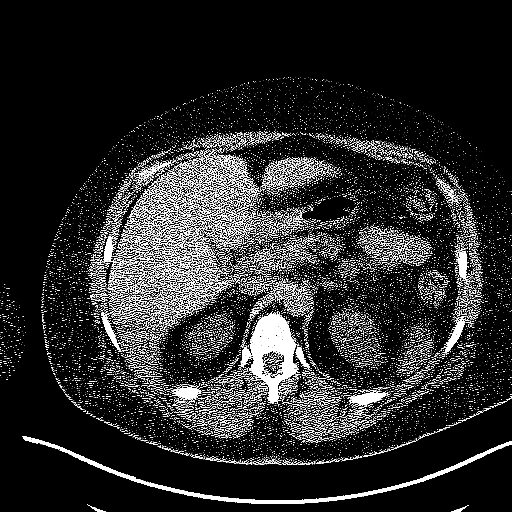
[im 65/65  brain]
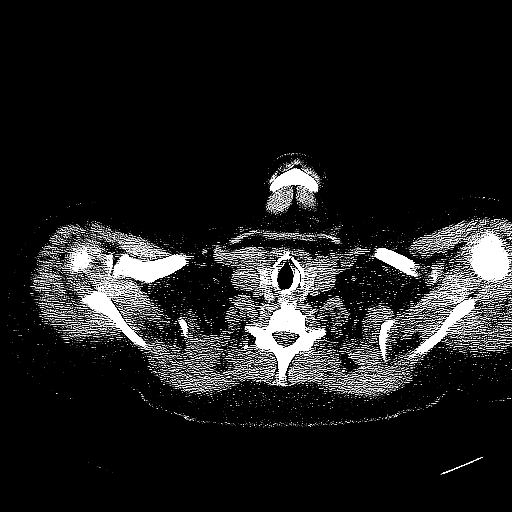

[Series 603: mip range · coronal · 1.94mm/px · 8 of 32 slices shown]
[im 1/32]
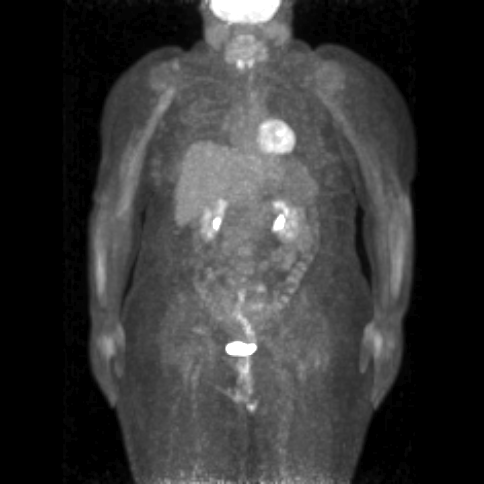
[im 4/32]
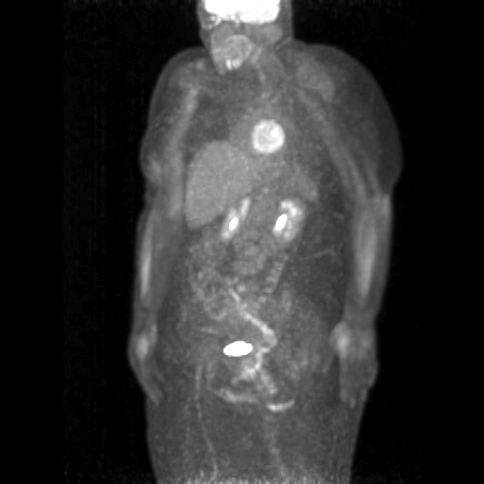
[im 7/32]
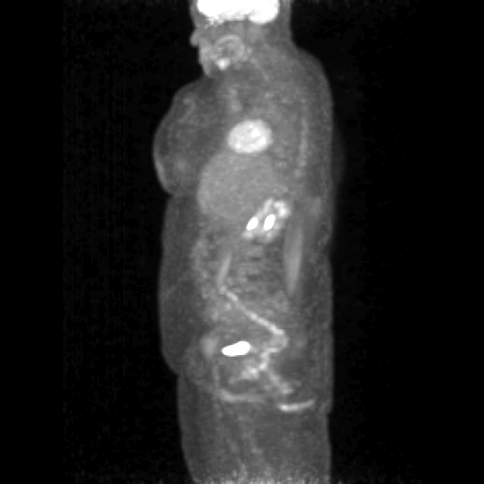
[im 14/32]
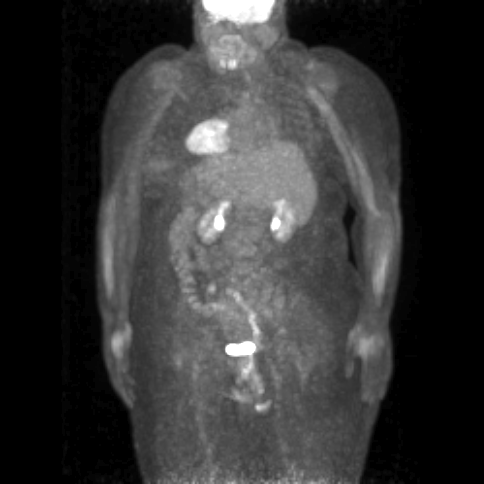
[im 18/32]
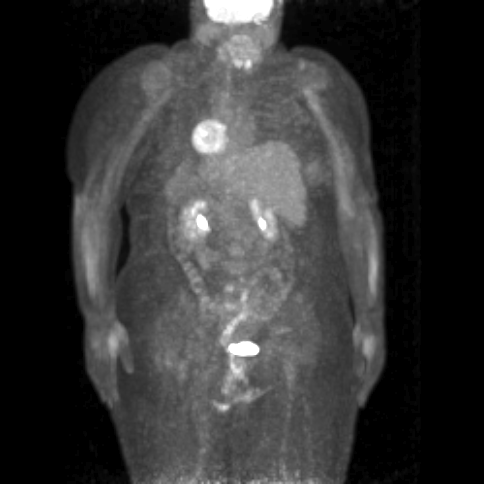
[im 21/32]
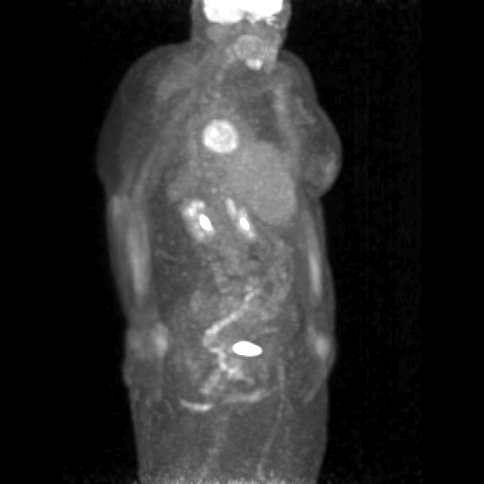
[im 28/32]
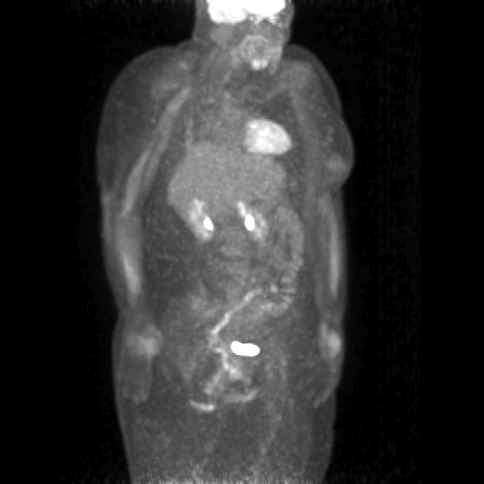
[im 32/32]
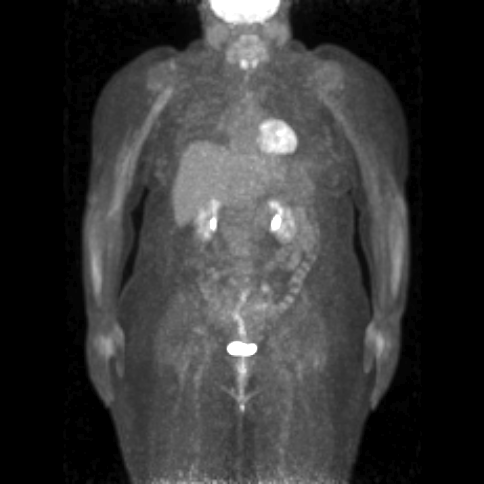

[31 of 40 positions shown; findings below may reference images not displayed]

FINDINGS: Mediastinal blood pool activity: SUV max

NECK: No areas of abnormal hypermetabolism.

Incidental CT findings: No cervical adenopathy.

CHEST: No pulmonary parenchymal or thoracic nodal hypermetabolism.

Incidental CT findings: Tiny hiatal hernia. No thoracic adenopathy.
The left breast nodule detailed on prior diagnostic chest CT is not
hypermetabolic, including on image 77/4.

ABDOMEN/PELVIS: No abdominopelvic parenchymal or nodal
hypermetabolism.

Incidental CT findings: Normal adrenal glands. No abdominopelvic
adenopathy. Suspect underlying uterine fibroids, given somewhat
globular appearance of the uterus.

SKELETON: Mildly heterogeneous marrow activity, felt to be within
normal variation. Areas of relative increased activity are not felt
to correspond to the multifocal sclerotic lesions described on prior
diagnostic CTs.

Incidental CT findings: No new superimposed osseous abnormality.
IMPRESSION: No evidence of hypermetabolic primary malignancy or soft tissue
metastasis.

## 2020-02-03 ENCOUNTER — Ambulatory Visit (INDEPENDENT_AMBULATORY_CARE_PROVIDER_SITE_OTHER): Payer: BC Managed Care – PPO | Admitting: Family Medicine

## 2020-02-11 ENCOUNTER — Encounter (INDEPENDENT_AMBULATORY_CARE_PROVIDER_SITE_OTHER): Payer: BC Managed Care – PPO | Admitting: *Deleted

## 2020-02-11 ENCOUNTER — Other Ambulatory Visit: Payer: Self-pay

## 2020-02-11 VITALS — BP 129/83 | HR 74 | Temp 98.3°F | Ht 63.0 in | Wt 269.0 lb

## 2020-02-11 DIAGNOSIS — Z006 Encounter for examination for normal comparison and control in clinical research program: Secondary | ICD-10-CM

## 2020-02-11 NOTE — Research (Signed)
Margaret Porter was here today for both A5322 and A5321 study visit. 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 Her vitamin D deficiency is much improved. She will be coming back in July for the next visit.

## 2020-02-12 LAB — CBC WITH DIFFERENTIAL/PLATELET
Absolute Monocytes: 383 cells/uL (ref 200–950)
Basophils Absolute: 22 cells/uL (ref 0–200)
Basophils Relative: 0.5 %
Eosinophils Absolute: 31 cells/uL (ref 15–500)
Eosinophils Relative: 0.7 %
HCT: 42.9 % (ref 35.0–45.0)
Hemoglobin: 14.6 g/dL (ref 11.7–15.5)
Lymphs Abs: 1311 cells/uL (ref 850–3900)
MCH: 32.4 pg (ref 27.0–33.0)
MCHC: 34 g/dL (ref 32.0–36.0)
MCV: 95.1 fL (ref 80.0–100.0)
MPV: 11.3 fL (ref 7.5–12.5)
Monocytes Relative: 8.7 %
Neutro Abs: 2653 cells/uL (ref 1500–7800)
Neutrophils Relative %: 60.3 %
Platelets: 221 10*3/uL (ref 140–400)
RBC: 4.51 10*6/uL (ref 3.80–5.10)
RDW: 13.7 % (ref 11.0–15.0)
Total Lymphocyte: 29.8 %
WBC: 4.4 10*3/uL (ref 3.8–10.8)

## 2020-02-12 LAB — HEMOGLOBIN A1C
Hgb A1c MFr Bld: 5.4 % of total Hgb (ref <5.7)
Mean Plasma Glucose: 108 (calc)
eAG (mmol/L): 6 (calc)

## 2020-02-12 LAB — LIPID PANEL
Cholesterol: 201 mg/dL — ABNORMAL HIGH (ref <200)
HDL: 43 mg/dL — ABNORMAL LOW (ref >=50)
LDL Cholesterol (Calc): 131 mg/dL (calc) — ABNORMAL HIGH
Non-HDL Cholesterol (Calc): 158 mg/dL (calc) — ABNORMAL HIGH (ref <130)
Total CHOL/HDL Ratio: 4.7 (calc) (ref <5.0)
Triglycerides: 155 mg/dL — ABNORMAL HIGH (ref <150)

## 2020-02-12 LAB — PROTEIN / CREATININE RATIO, URINE
Creatinine, Urine: 185 mg/dL (ref 20–275)
Protein/Creat Ratio: 97 mg/g creat (ref 21–161)
Protein/Creatinine Ratio: 0.097 mg/mg creat (ref 0.021–0.16)
Total Protein, Urine: 18 mg/dL (ref 5–24)

## 2020-02-12 LAB — COMPREHENSIVE METABOLIC PANEL
AG Ratio: 2 (calc) (ref 1.0–2.5)
ALT: 22 U/L (ref 6–29)
AST: 19 U/L (ref 10–35)
Albumin: 4.3 g/dL (ref 3.6–5.1)
Alkaline phosphatase (APISO): 106 U/L (ref 37–153)
BUN/Creatinine Ratio: 17 (calc) (ref 6–22)
BUN: 19 mg/dL (ref 7–25)
CO2: 25 mmol/L (ref 20–32)
Calcium: 9.4 mg/dL (ref 8.6–10.4)
Chloride: 108 mmol/L (ref 98–110)
Creat: 1.11 mg/dL — ABNORMAL HIGH (ref 0.50–1.05)
Globulin: 2.1 g/dL (calc) (ref 1.9–3.7)
Glucose, Bld: 98 mg/dL (ref 65–99)
Potassium: 4.5 mmol/L (ref 3.5–5.3)
Sodium: 140 mmol/L (ref 135–146)
Total Bilirubin: 0.4 mg/dL (ref 0.2–1.2)
Total Protein: 6.4 g/dL (ref 6.1–8.1)

## 2020-02-14 ENCOUNTER — Encounter (INDEPENDENT_AMBULATORY_CARE_PROVIDER_SITE_OTHER): Payer: Self-pay | Admitting: Family Medicine

## 2020-02-15 ENCOUNTER — Encounter (INDEPENDENT_AMBULATORY_CARE_PROVIDER_SITE_OTHER): Payer: Self-pay | Admitting: Family Medicine

## 2020-02-16 ENCOUNTER — Other Ambulatory Visit: Payer: Self-pay

## 2020-02-16 ENCOUNTER — Ambulatory Visit (INDEPENDENT_AMBULATORY_CARE_PROVIDER_SITE_OTHER): Payer: BC Managed Care – PPO | Admitting: Bariatrics

## 2020-02-16 ENCOUNTER — Encounter (INDEPENDENT_AMBULATORY_CARE_PROVIDER_SITE_OTHER): Payer: Self-pay | Admitting: Bariatrics

## 2020-02-16 VITALS — BP 113/78 | HR 74 | Temp 98.3°F | Ht 63.0 in | Wt 261.0 lb

## 2020-02-16 DIAGNOSIS — E559 Vitamin D deficiency, unspecified: Secondary | ICD-10-CM

## 2020-02-16 DIAGNOSIS — Z9189 Other specified personal risk factors, not elsewhere classified: Secondary | ICD-10-CM

## 2020-02-16 DIAGNOSIS — R5383 Other fatigue: Secondary | ICD-10-CM | POA: Diagnosis not present

## 2020-02-16 DIAGNOSIS — Z6841 Body Mass Index (BMI) 40.0 and over, adult: Secondary | ICD-10-CM

## 2020-02-16 DIAGNOSIS — E8881 Metabolic syndrome: Secondary | ICD-10-CM

## 2020-02-16 NOTE — Progress Notes (Signed)
Chief Complaint:   Margaret Porter is here to discuss her progress with her obesity treatment plan along with follow-up of her obesity related diagnoses. Margaret Porter is following a lower carbohydrate, vegetable and lean protein rich diet plan and states she is following her eating plan approximately 85% of the time. Margaret Porter states she is walking 30 minutes 3 times per week.  Today's visit was #: 63 Starting weight: 262 lbs Starting date: 11/12/2018 Today's weight: 261 lbs Today's date: 02/16/2020 Total lbs lost to date: 1 Total lbs lost since last in-office visit: 0  Interim History: Margaret Porter is not getting enough water daily.  Subjective:   Insulin resistance. Margaret Porter has a diagnosis of insulin resistance based on her elevated fasting insulin level >5. She continues to work on diet and exercise to decrease her risk of diabetes. No polyphagia.  Lab Results  Component Value Date   INSULIN 18.2 06/29/2019   INSULIN 21.1 11/12/2018   Lab Results  Component Value Date   HGBA1C 5.4 02/11/2020   Vitamin D deficiency. No nausea, vomiting, or muscle weakness. Last Vitamin D level 82.82 on 10/09/2019.  Other fatigue. Margaret Porter reports fatigue with certain activities.  At risk for hypoglycemia. Margaret Porter is at increased risk for hypoglycemia due to insulin resistance, dietary changes, and skipping meals.   Assessment/Plan:   Insulin resistance. [place] will continue to work on weight loss, exercise, and decreasing simple carbohydrates to help decrease the risk of diabetes. Margaret Porter agreed to follow-up with [name] as directed to closely monitor her progress. Insulin, random level ordered.  Vitamin D deficiency. Low Vitamin D level contributes to fatigue and are associated with obesity, breast, and colon cancer. She agrees to continue to take Vitamin D. VITAMIN D 25 Hydroxy (Vit-D Deficiency, Fractures) level ordered.  Other fatigue. Fatigue may be related to obesity,  depression or many other causes. Labs will be ordered, and in the meanwhile, Margaret Porter will focus on self care including making healthy food choices, increasing physical activity and focusing on stress reduction. She will gradually increase activity over time. T3, T4, free, TSH labs ordered.  At risk for hypoglycemia. Margaret Porter was given approximately 15 minutes of counseling today regarding prevention of hypoglycemia. She was advised of symptoms of hypoglycemia. Margaret Porter was instructed to avoid skipping meals, eat regular protein rich meals and schedule low calorie snacks as needed.   Repetitive spaced learning was employed today to elicit superior memory formation and behavioral change.  Class 3 severe obesity with serious comorbidity and body mass index (BMI) of 45.0 to 49.9 in adult, unspecified obesity type (Margaret Porter).  Margaret Porter is currently in the action stage of change. As such, her goal is to continue with weight loss efforts. She has agreed to following a lower carbohydrate, vegetable and lean protein rich diet plan.   She will work on meal planning, mindful eating, and discontinue meal skipping.  We reviewed lab results with the patient including CMP, lipids, CBC, and HgbA1c.  Exercise goals: All adults should avoid inactivity. Some physical activity is better than none, and adults who participate in any amount of physical activity gain some health benefits.  Behavioral modification strategies: increasing lean protein intake, decreasing simple carbohydrates, increasing vegetables, increasing water intake, decreasing eating out, no skipping meals, meal planning and cooking strategies and keeping healthy foods in the home.  Any has agreed to follow-up with our clinic in 2 weeks. She was informed of the importance of frequent follow-up visits to maximize her success with intensive  lifestyle modifications for her multiple health conditions.   Margaret Porter was informed we would discuss her lab  results at her next visit unless there is a critical issue that needs to be addressed sooner. Margaret Porter agreed to keep her next visit at the agreed upon time to discuss these results.  Objective:   Blood pressure 113/78, pulse 74, temperature 98.3 F (36.8 C), height 5\' 3"  (1.6 m), weight 261 lb (118.4 kg), SpO2 97 %. Body mass index is 46.23 kg/m.  General: Cooperative, alert, well developed, in no acute distress. HEENT: Conjunctivae and lids unremarkable. Cardiovascular: Regular rhythm.  Lungs: Normal work of breathing. Neurologic: No focal deficits.   Lab Results  Component Value Date   CREATININE 1.11 (H) 02/11/2020   BUN 19 02/11/2020   NA 140 02/11/2020   K 4.5 02/11/2020   CL 108 02/11/2020   CO2 25 02/11/2020   Lab Results  Component Value Date   ALT 22 02/11/2020   AST 19 02/11/2020   ALKPHOS 122 10/09/2019   BILITOT 0.4 02/11/2020   Lab Results  Component Value Date   HGBA1C 5.4 02/11/2020   HGBA1C 5.0 06/29/2019   HGBA1C 5.3 12/02/2018   HGBA1C 5.4 11/12/2018   HGBA1C 5.3 02/14/2018   Lab Results  Component Value Date   INSULIN 18.2 06/29/2019   INSULIN 21.1 11/12/2018   Lab Results  Component Value Date   TSH 2.421 04/09/2019   Lab Results  Component Value Date   CHOL 201 (H) 02/11/2020   HDL 43 (L) 02/11/2020   LDLCALC 131 (H) 02/11/2020   TRIG 155 (H) 02/11/2020   CHOLHDL 4.7 02/11/2020   Lab Results  Component Value Date   WBC 4.4 02/11/2020   HGB 14.6 02/11/2020   HCT 42.9 02/11/2020   MCV 95.1 02/11/2020   PLT 221 02/11/2020   No results found for: IRON, TIBC, FERRITIN  Attestation Statements:   Reviewed by clinician on day of visit: allergies, medications, problem list, medical history, surgical history, family history, social history, and previous encounter notes.  Migdalia Dk, am acting as Location manager for [employer], DO   I have reviewed the above documentation for accuracy and completeness, and I agree with the  above. Jearld Lesch, DO

## 2020-02-17 LAB — TSH: TSH: 2.76 u[IU]/mL (ref 0.450–4.500)

## 2020-02-17 LAB — INSULIN, RANDOM: INSULIN: 30.4 u[IU]/mL — ABNORMAL HIGH (ref 2.6–24.9)

## 2020-02-17 LAB — VITAMIN D 25 HYDROXY (VIT D DEFICIENCY, FRACTURES): Vit D, 25-Hydroxy: 40.2 ng/mL (ref 30.0–100.0)

## 2020-02-17 LAB — T3: T3, Total: 134 ng/dL (ref 71–180)

## 2020-02-17 LAB — T4, FREE: Free T4: 1.2 ng/dL (ref 0.82–1.77)

## 2020-02-24 ENCOUNTER — Encounter: Payer: Self-pay | Admitting: Infectious Disease

## 2020-02-24 LAB — CD4/CD8 (T-HELPER/T-SUPPRESSOR CELL): HIV 1 RNA UltraQuant: <40

## 2020-02-27 ENCOUNTER — Ambulatory Visit: Payer: BC Managed Care – PPO | Attending: Internal Medicine

## 2020-02-27 DIAGNOSIS — Z23 Encounter for immunization: Secondary | ICD-10-CM

## 2020-02-27 NOTE — Progress Notes (Signed)
   Covid-19 Vaccination Clinic  Name:  Margaret Porter    MRN: [id number] DOB: 1970-08-19  02/27/2020  [name] was observed post Covid-19 immunization for 15 minutes without incidence. She was provided with Vaccine Information Sheet and instruction to access the V-Safe system.   [name] was instructed to call 911 with any severe reactions post vaccine: [name] Kitchen Difficulty breathing  . Swelling of your face and throat  . A fast heartbeat  . A bad rash all over your body  . Dizziness and weakness    Immunizations Administered    Name Date Dose VIS Date Route   Pfizer COVID-19 Vaccine 02/27/2020  2:02 PM 0.3 mL 12/11/2019 Intramuscular   Manufacturer: [place]   Lot: [id number]   [place]: [id number]

## 2020-02-29 ENCOUNTER — Encounter (INDEPENDENT_AMBULATORY_CARE_PROVIDER_SITE_OTHER): Payer: Self-pay | Admitting: Family Medicine

## 2020-02-29 ENCOUNTER — Ambulatory Visit (INDEPENDENT_AMBULATORY_CARE_PROVIDER_SITE_OTHER): Payer: BC Managed Care – PPO | Admitting: Family Medicine

## 2020-02-29 ENCOUNTER — Other Ambulatory Visit: Payer: Self-pay

## 2020-02-29 VITALS — BP 121/79 | HR 85 | Temp 97.8°F | Ht 63.0 in | Wt 265.0 lb

## 2020-02-29 DIAGNOSIS — G4709 Other insomnia: Secondary | ICD-10-CM

## 2020-02-29 DIAGNOSIS — E559 Vitamin D deficiency, unspecified: Secondary | ICD-10-CM

## 2020-02-29 DIAGNOSIS — Z6841 Body Mass Index (BMI) 40.0 and over, adult: Secondary | ICD-10-CM

## 2020-03-01 ENCOUNTER — Encounter (INDEPENDENT_AMBULATORY_CARE_PROVIDER_SITE_OTHER): Payer: Self-pay | Admitting: Family Medicine

## 2020-03-01 NOTE — Progress Notes (Signed)
Chief Complaint:   Margaret Porter is here to discuss her progress with her obesity treatment plan along with follow-up of her obesity related diagnoses. Margaret Porter is on the Category 3 Plan and states she is following her eating plan approximately 85% of the time. Margaret Porter states she is walking 60 minutes 3 times per week.  Today's visit was #: 48 Starting weight: 262 lbs Starting date: 11/12/2018 Today's weight: 265 lbs Today's date: 02/29/2020 Total lbs lost to date: 0 Total lbs lost since last in-office visit: 0  Interim History: Margaret Porter is still skipping breakfast during the week and on the weekends. She is still eating out for lunch during the week and usually has fast food.  Subjective:   Other insomnia. Margaret Porter reports being unable to sleep at night due to taking a nap after school and sometimes staying up too late watching TV.  Vitamin D deficiency. Vitamin D level has decreased to 40.4 on 02/16/2020 from 82.82 on 10/09/2019. She had been on twice weekly Vitamin D per Oncology, but is now on once weekly.  Assessment/Plan:   Other insomnia. The problem of recurrent insomnia was discussed. Orders and follow up as documented in patient record. Counseling: Intensive lifestyle modifications are the first line treatment for this issue. We discussed several lifestyle modifications today and she will continue to work on diet, exercise and weight loss efforts. We discussed good sleep hygiene. She will decrease naps and add walking most days of the week instead.  Vitamin D deficiency. Low Vitamin D level contributes to fatigue and are associated with obesity, breast, and colon cancer. Margaret Porter will add OTC 2000 IU Vitamin D3 daily and continue weekly prescription Vitamin D. She will follow-up for routine testing of Vitamin D, at least 2-3 times per year to avoid over-replacement.  Class 3 severe obesity with serious comorbidity and body mass index (BMI) of 45.0 to 49.9 in  adult, unspecified obesity type (Margaret Porter).  Margaret Porter is currently in the action stage of change. As such, her goal is to continue with weight loss efforts. She has agreed to the Category 3 Plan.   We discussed packing lunch for school and having a shake for breakfast rather than skipping breakfast.  Exercise goals: Margaret Porter will increase walking to 5 times a week.  Behavioral modification strategies: increasing lean protein intake, decreasing simple carbohydrates, decreasing eating out, meal planning and cooking strategies and planning for success.  Margaret Porter has agreed to follow-up with our clinic in 3 weeks. She was informed of the importance of frequent follow-up visits to maximize her success with intensive lifestyle modifications for her multiple health conditions.   Objective:   Blood pressure 121/79, pulse 85, temperature 97.8 F (36.6 C), temperature source Oral, height 5\' 3"  (1.6 m), weight 265 lb (120.2 kg), SpO2 98 %. Body mass index is 46.94 kg/m.  General: Cooperative, alert, well developed, in no acute distress. HEENT: Conjunctivae and lids unremarkable. Cardiovascular: Regular rhythm.  Lungs: Normal work of breathing. Neurologic: No focal deficits.   Lab Results  Component Value Date   CREATININE 1.11 (H) 02/11/2020   BUN 19 02/11/2020   NA 140 02/11/2020   K 4.5 02/11/2020   CL 108 02/11/2020   CO2 25 02/11/2020   Lab Results  Component Value Date   ALT 22 02/11/2020   AST 19 02/11/2020   ALKPHOS 122 10/09/2019   BILITOT 0.4 02/11/2020   Lab Results  Component Value Date   HGBA1C 5.4 02/11/2020   HGBA1C  5.0 06/29/2019   HGBA1C 5.3 12/02/2018   HGBA1C 5.4 11/12/2018   HGBA1C 5.3 02/14/2018   Lab Results  Component Value Date   INSULIN 30.4 (H) 02/16/2020   INSULIN 18.2 06/29/2019   INSULIN 21.1 11/12/2018   Lab Results  Component Value Date   TSH 2.760 02/16/2020   Lab Results  Component Value Date   CHOL 201 (H) 02/11/2020   HDL 43 (L)  02/11/2020   LDLCALC 131 (H) 02/11/2020   TRIG 155 (H) 02/11/2020   CHOLHDL 4.7 02/11/2020   Lab Results  Component Value Date   WBC 4.4 02/11/2020   HGB 14.6 02/11/2020   HCT 42.9 02/11/2020   MCV 95.1 02/11/2020   PLT 221 02/11/2020   No results found for: IRON, TIBC, FERRITIN  Attestation Statements:   Reviewed by clinician on day of visit: allergies, medications, problem list, medical history, surgical history, family history, social history, and previous encounter notes.  IMichaelene Song, am acting as Location manager for [employer], FNP   I have reviewed the above documentation for accuracy and completeness, and I agree with the above. -  Georgianne Fick, FNP

## 2020-03-03 ENCOUNTER — Telehealth: Payer: Self-pay | Admitting: Medical

## 2020-03-03 NOTE — Telephone Encounter (Signed)
Received requested records from [facility] 

## 2020-03-15 ENCOUNTER — Encounter: Payer: Self-pay | Admitting: Medical

## 2020-03-19 ENCOUNTER — Ambulatory Visit: Payer: BC Managed Care – PPO | Attending: Internal Medicine

## 2020-03-19 DIAGNOSIS — Z23 Encounter for immunization: Secondary | ICD-10-CM

## 2020-03-19 NOTE — Progress Notes (Signed)
   Covid-19 Vaccination Clinic  Name:  Margaret Porter    MRN: [id number] DOB: [birth year]  03/19/2020  [name] was observed post Covid-19 immunization for 15 minutes without incident. She was provided with Vaccine Information Sheet and instruction to access the V-Safe system.   [name] was instructed to call 911 with any severe reactions post vaccine: [name] Kitchen Difficulty breathing  . Swelling of face and throat  . A fast heartbeat  . A bad rash all over body  . Dizziness and weakness   Immunizations Administered    Name Date Dose VIS Date Route   Pfizer COVID-19 Vaccine 03/19/2020 10:30 AM 0.3 mL 12/11/2019 Intramuscular   Manufacturer: [place]   Lot: [id number]   [place]: [id number]

## 2020-03-21 ENCOUNTER — Ambulatory Visit (INDEPENDENT_AMBULATORY_CARE_PROVIDER_SITE_OTHER): Payer: BC Managed Care – PPO | Admitting: Family Medicine

## 2020-03-21 ENCOUNTER — Other Ambulatory Visit: Payer: Self-pay

## 2020-03-21 ENCOUNTER — Encounter (INDEPENDENT_AMBULATORY_CARE_PROVIDER_SITE_OTHER): Payer: Self-pay | Admitting: Family Medicine

## 2020-03-21 VITALS — BP 115/78 | HR 85 | Temp 97.9°F | Ht 63.0 in | Wt 268.0 lb

## 2020-03-21 DIAGNOSIS — E8881 Metabolic syndrome: Secondary | ICD-10-CM

## 2020-03-21 DIAGNOSIS — Z6841 Body Mass Index (BMI) 40.0 and over, adult: Secondary | ICD-10-CM | POA: Diagnosis not present

## 2020-03-22 NOTE — Progress Notes (Signed)
Chief Complaint:   OBESITY Margaret Porter is here to discuss her progress with her obesity treatment plan along with follow-up of her obesity related diagnoses. Margaret Porter is on the Category 3 Plan and states she is following her eating plan approximately 80% of the time. Margaret Porter states she is walking for 40 minutes 3 times per week.  Today's visit was #: 18 Starting weight: 262 lbs Starting date: 11/12/2018 Today's weight: 268 lbs Today's date: 03/21/2020 Total lbs lost to date: 0 Total lbs lost since last in-office visit: 0  Interim History: Margaret Porter has not skipped any meals over the past few weeks. She is still eating out at lunch. She often eats out at [facility] or she picks up other fast food. We have discussed healthy food choices when dining out several times.  Subjective:   1. Insulin resistance Margaret Porter is on metformin. Last A1c was 5.4. She denies polyphagia. Lab Results  Component Value Date   HGBA1C 5.4 02/11/2020    Assessment/Plan:   1. Insulin resistance Margaret Porter will continue her meal plan, and will continue to work on weight loss, exercise, and decreasing simple carbohydrates to help decrease the risk of diabetes. Margaret Porter agreed to follow-up with [name] as directed to closely monitor her progress.  2. Class 3 severe obesity with serious comorbidity and body mass index (BMI) of 45.0 to 49.9 in adult, unspecified obesity type (HCC) Margaret Porter is currently in the action stage of change. As such, her goal is to continue with weight loss efforts. She has agreed to the Category 3 Plan.   Margaret Porter was encouraged to pack her lunch and make better choices when dining out. Handout given: Eating Out.  Exercise goals: All adults should avoid inactivity. Some physical activity is better than none, and adults who participate in any amount of physical activity gain some health benefits.  Behavioral modification strategies: decreasing eating out, no skipping meals and meal planning and  cooking strategies.  Margaret Porter has agreed to follow-up with our clinic in 3 weeks. She was informed of the importance of frequent follow-up visits to maximize her success with intensive lifestyle modifications for her multiple health conditions.   Objective:   Blood pressure 115/78, pulse 85, temperature 97.9 F (36.6 C), height 5\' 3"  (1.6 m), weight 268 lb (121.6 kg), SpO2 97 %. Body mass index is 47.47 kg/m.  General: Cooperative, alert, well developed, in no acute distress. HEENT: Conjunctivae and lids unremarkable. Cardiovascular: Regular rhythm.  Lungs: Normal work of breathing. Neurologic: No focal deficits.   Lab Results  Component Value Date   CREATININE 1.11 (H) 02/11/2020   BUN 19 02/11/2020   NA 140 02/11/2020   K 4.5 02/11/2020   CL 108 02/11/2020   CO2 25 02/11/2020   Lab Results  Component Value Date   ALT 22 02/11/2020   AST 19 02/11/2020   ALKPHOS 122 10/09/2019   BILITOT 0.4 02/11/2020   Lab Results  Component Value Date   HGBA1C 5.4 02/11/2020   HGBA1C 5.0 06/29/2019   HGBA1C 5.3 12/02/2018   HGBA1C 5.4 11/12/2018   HGBA1C 5.3 02/14/2018   Lab Results  Component Value Date   INSULIN 30.4 (H) 02/16/2020   INSULIN 18.2 06/29/2019   INSULIN 21.1 11/12/2018   Lab Results  Component Value Date   TSH 2.760 02/16/2020   Lab Results  Component Value Date   CHOL 201 (H) 02/11/2020   HDL 43 (L) 02/11/2020   LDLCALC 131 (H) 02/11/2020   TRIG 155 (H) 02/11/2020  CHOLHDL 4.7 02/11/2020   Lab Results  Component Value Date   WBC 4.4 02/11/2020   HGB 14.6 02/11/2020   HCT 42.9 02/11/2020   MCV 95.1 02/11/2020   PLT 221 02/11/2020   No results found for: IRON, TIBC, FERRITIN  Attestation Statements:   Reviewed by clinician on day of visit: allergies, medications, problem list, medical history, surgical history, family history, social history, and previous encounter notes.   Wilhemena Durie, am acting as Location manager for [employer],  FNP-C.  I have reviewed the above documentation for accuracy and completeness, and I agree with the above. -  Georgianne Fick, FNP

## 2020-04-11 ENCOUNTER — Ambulatory Visit (INDEPENDENT_AMBULATORY_CARE_PROVIDER_SITE_OTHER): Payer: BC Managed Care – PPO | Admitting: Family Medicine

## 2020-04-11 ENCOUNTER — Encounter (INDEPENDENT_AMBULATORY_CARE_PROVIDER_SITE_OTHER): Payer: Self-pay | Admitting: Family Medicine

## 2020-04-11 ENCOUNTER — Other Ambulatory Visit: Payer: Self-pay

## 2020-04-11 VITALS — BP 113/75 | HR 72 | Temp 97.9°F | Ht 63.0 in | Wt 274.0 lb

## 2020-04-11 DIAGNOSIS — Z6841 Body Mass Index (BMI) 40.0 and over, adult: Secondary | ICD-10-CM | POA: Diagnosis not present

## 2020-04-11 DIAGNOSIS — E559 Vitamin D deficiency, unspecified: Secondary | ICD-10-CM | POA: Diagnosis not present

## 2020-04-11 DIAGNOSIS — E66813 Obesity, class 3: Secondary | ICD-10-CM

## 2020-04-11 NOTE — Progress Notes (Signed)
Chief Complaint:   Margaret Porter is here to discuss her progress with her obesity treatment plan along with follow-up of her obesity related diagnoses. Margaret Porter is on the Category 3 Plan and states she is following her eating plan approximately 80% of the time. Margaret Porter states she is walking 50 minutes 3 times per week.  Today's visit was #: 58 Starting weight: 262 lbs Starting date: 11/12/2018 Today's weight: 274 lbs Today's date: 04/11/2020 Total lbs lost to date: 0 Total lbs lost since last in-office visit: 0  Interim History: Margaret Porter is not skipping any meals. She has been packing her lunch most days. She denies excessive snacking. She reports deviating from the plan at breakfast and not having any protein. She is also having carbs with dinner.  Subjective:   Vitamin D deficiency. Margaret Porter is on OTC Vitamin D3 2,000 daily and once weekly prescription Vitamin D. Last Vitamin D 40.2 on 02/16/2020.  Assessment/Plan:   Vitamin D deficiency. Low Vitamin D level contributes to fatigue and are associated with obesity, breast, and colon cancer. She agrees to continue to take OTC and prescription Vitamin D as directed and will follow-up for routine testing of Vitamin D, at least 2-3 times per year to avoid over-replacement.  Class 3 severe obesity with serious comorbidity and body mass index (BMI) of 45.0 to 49.9 in adult, unspecified obesity type ([place]).  Margaret Porter is currently in the action stage of change. As such, her goal is to continue with weight loss efforts. She has agreed to the Category 3 Plan.   She will try to strictly stick to the Category 3 plan the next few weeks.  Exercise goals: Margaret Porter will continue her current exercise regimen.  Behavioral modification strategies: increasing lean protein intake, decreasing simple carbohydrates and meal planning and cooking strategies.  Margaret Porter has agreed to follow-up with our clinic in 3 weeks. She was informed of  the importance of frequent follow-up visits to maximize her success with intensive lifestyle modifications for her multiple health conditions.   Objective:   Blood pressure 113/75, pulse 72, temperature 97.9 F (36.6 C), temperature source Oral, height 5\' 3"  (1.6 m), weight 274 lb (124.3 kg), SpO2 94 %. Body mass index is 48.54 kg/m.  General: Cooperative, alert, well developed, in no acute distress. HEENT: Conjunctivae and lids unremarkable. Cardiovascular: Regular rhythm.  Lungs: Normal work of breathing. Neurologic: No focal deficits.   Lab Results  Component Value Date   CREATININE 1.11 (H) 02/11/2020   BUN 19 02/11/2020   NA 140 02/11/2020   K 4.5 02/11/2020   CL 108 02/11/2020   CO2 25 02/11/2020   Lab Results  Component Value Date   ALT 22 02/11/2020   AST 19 02/11/2020   ALKPHOS 122 10/09/2019   BILITOT 0.4 02/11/2020   Lab Results  Component Value Date   HGBA1C 5.4 02/11/2020   HGBA1C 5.0 06/29/2019   HGBA1C 5.3 12/02/2018   HGBA1C 5.4 11/12/2018   HGBA1C 5.3 02/14/2018   Lab Results  Component Value Date   INSULIN 30.4 (H) 02/16/2020   INSULIN 18.2 06/29/2019   INSULIN 21.1 11/12/2018   Lab Results  Component Value Date   TSH 2.760 02/16/2020   Lab Results  Component Value Date   CHOL 201 (H) 02/11/2020   HDL 43 (L) 02/11/2020   LDLCALC 131 (H) 02/11/2020   TRIG 155 (H) 02/11/2020   CHOLHDL 4.7 02/11/2020   Lab Results  Component Value Date   WBC 4.4  02/11/2020   HGB 14.6 02/11/2020   HCT 42.9 02/11/2020   MCV 95.1 02/11/2020   PLT 221 02/11/2020   No results found for: IRON, TIBC, FERRITIN  Attestation Statements:   Reviewed by clinician on day of visit: allergies, medications, problem list, medical history, surgical history, family history, social history, and previous encounter notes.  Margaret Porter, am acting as Location manager for [employer], FNP   I have reviewed the above documentation for accuracy and completeness, and I  agree with the above. -  Margaret Fick, FNP

## 2020-04-12 ENCOUNTER — Encounter (INDEPENDENT_AMBULATORY_CARE_PROVIDER_SITE_OTHER): Payer: Self-pay | Admitting: Family Medicine

## 2020-04-13 ENCOUNTER — Ambulatory Visit
Admission: RE | Admit: 2020-04-13 | Discharge: 2020-04-13 | Disposition: A | Discharge status: Home / Self Care | Payer: BC Managed Care – PPO | Source: Ambulatory Visit | Attending: Medical | Admitting: Medical

## 2020-04-13 ENCOUNTER — Other Ambulatory Visit: Payer: Self-pay

## 2020-04-13 DIAGNOSIS — Z0000 Encounter for general adult medical examination without abnormal findings: Secondary | ICD-10-CM

## 2020-04-13 DIAGNOSIS — E559 Vitamin D deficiency, unspecified: Secondary | ICD-10-CM

## 2020-04-13 DIAGNOSIS — N951 Menopausal and female climacteric states: Secondary | ICD-10-CM

## 2020-04-13 DIAGNOSIS — E8881 Metabolic syndrome: Secondary | ICD-10-CM

## 2020-04-13 DIAGNOSIS — E2839 Other primary ovarian failure: Secondary | ICD-10-CM

## 2020-04-25 ENCOUNTER — Ambulatory Visit (INDEPENDENT_AMBULATORY_CARE_PROVIDER_SITE_OTHER): Payer: BC Managed Care – PPO | Admitting: Family Medicine

## 2020-04-26 ENCOUNTER — Other Ambulatory Visit: Payer: Self-pay

## 2020-04-26 ENCOUNTER — Ambulatory Visit (INDEPENDENT_AMBULATORY_CARE_PROVIDER_SITE_OTHER): Payer: BC Managed Care – PPO | Admitting: Family Medicine

## 2020-04-26 ENCOUNTER — Encounter (INDEPENDENT_AMBULATORY_CARE_PROVIDER_SITE_OTHER): Payer: Self-pay | Admitting: Family Medicine

## 2020-04-26 VITALS — BP 112/77 | HR 71 | Temp 97.4°F | Ht 63.0 in | Wt 269.0 lb

## 2020-04-26 DIAGNOSIS — Z6841 Body Mass Index (BMI) 40.0 and over, adult: Secondary | ICD-10-CM

## 2020-04-26 DIAGNOSIS — E8881 Metabolic syndrome: Secondary | ICD-10-CM | POA: Diagnosis not present

## 2020-04-27 NOTE — Progress Notes (Signed)
Chief Complaint:   OBESITY Margaret Porter is here to discuss her progress with her obesity treatment plan along with follow-up of her obesity related diagnoses. Margaret Porter is on the Category 3 Plan and states she is following her eating plan approximately 80% of the time. Margaret Porter states she is walking for 45 minutes 3-4 times per week.  Today's visit was #: 29 Starting weight: 262 lbs Starting date: 11/12/2018 Today's weight: 269 lbs Today's date: 04/26/2020 Total lbs lost to date: 0 Total lbs lost since last in-office visit: 5  Interim History: Margaret Porter is eating out less, and when she does eat out she chooses a salad. She is packing her lunch more. She does still eat out about 2 times per week.  Subjective:   1. Insulin resistance Margaret Porter denies polyphagia. She is not on metformin.  Assessment/Plan:   1. Insulin resistance Margaret Porter will continue her meal plan, and will continue to work on weight loss, exercise, and decreasing simple carbohydrates to help decrease the risk of diabetes. Margaret Porter agreed to follow-up with [name] as directed to closely monitor her progress.  2. Class 3 severe obesity with serious comorbidity and body mass index (BMI) of 45.0 to 49.9 in adult, unspecified obesity type (HCC) Margaret Porter is currently in the action stage of change. As such, her goal is to continue with weight loss efforts. She has agreed to the Category 3 Plan.   Margaret Porter will continue to decrease dining out and increase meal planning.  Exercise goals: As is.  Behavioral modification strategies: increasing lean protein intake, decreasing simple carbohydrates, decreasing eating out, meal planning and cooking strategies and better snacking choices.  Margaret Porter has agreed to follow-up with our clinic in 3 weeks. She was informed of the importance of frequent follow-up visits to maximize her success with intensive lifestyle modifications for her multiple health conditions.   Objective:   Blood  pressure 112/77, pulse 71, temperature (!) 97.4 F (36.3 C), temperature source Oral, height 5\' 3"  (1.6 m), weight 269 lb (122 kg), SpO2 97 %. Body mass index is 47.65 kg/m.  General: Cooperative, alert, well developed, in no acute distress. HEENT: Conjunctivae and lids unremarkable. Cardiovascular: Regular rhythm.  Lungs: Normal work of breathing. Neurologic: No focal deficits.   Lab Results  Component Value Date   CREATININE 1.11 (H) 02/11/2020   BUN 19 02/11/2020   NA 140 02/11/2020   K 4.5 02/11/2020   CL 108 02/11/2020   CO2 25 02/11/2020   Lab Results  Component Value Date   ALT 22 02/11/2020   AST 19 02/11/2020   ALKPHOS 122 10/09/2019   BILITOT 0.4 02/11/2020   Lab Results  Component Value Date   HGBA1C 5.4 02/11/2020   HGBA1C 5.0 06/29/2019   HGBA1C 5.3 12/02/2018   HGBA1C 5.4 11/12/2018   HGBA1C 5.3 02/14/2018   Lab Results  Component Value Date   INSULIN 30.4 (H) 02/16/2020   INSULIN 18.2 06/29/2019   INSULIN 21.1 11/12/2018   Lab Results  Component Value Date   TSH 2.760 02/16/2020   Lab Results  Component Value Date   CHOL 201 (H) 02/11/2020   HDL 43 (L) 02/11/2020   LDLCALC 131 (H) 02/11/2020   TRIG 155 (H) 02/11/2020   CHOLHDL 4.7 02/11/2020   Lab Results  Component Value Date   WBC 4.4 02/11/2020   HGB 14.6 02/11/2020   HCT 42.9 02/11/2020   MCV 95.1 02/11/2020   PLT 221 02/11/2020   No results found for: IRON, TIBC, FERRITIN  Attestation Statements:   Reviewed by clinician on day of visit: allergies, medications, problem list, medical history, surgical history, family history, social history, and previous encounter notes.   Wilhemena Durie, am acting as Location manager for [employer], FNP-C.  I have reviewed the above documentation for accuracy and completeness, and I agree with the above. -  Georgianne Fick, FNP

## 2020-04-28 ENCOUNTER — Encounter (INDEPENDENT_AMBULATORY_CARE_PROVIDER_SITE_OTHER): Payer: Self-pay | Admitting: Family Medicine

## 2020-05-16 ENCOUNTER — Ambulatory Visit (INDEPENDENT_AMBULATORY_CARE_PROVIDER_SITE_OTHER): Payer: BC Managed Care – PPO | Admitting: Family Medicine

## 2020-05-16 ENCOUNTER — Encounter (INDEPENDENT_AMBULATORY_CARE_PROVIDER_SITE_OTHER): Payer: Self-pay | Admitting: Family Medicine

## 2020-05-16 ENCOUNTER — Other Ambulatory Visit: Payer: Self-pay

## 2020-05-16 VITALS — BP 117/76 | HR 67 | Temp 97.2°F | Ht 63.0 in | Wt 269.0 lb

## 2020-05-16 DIAGNOSIS — J302 Other seasonal allergic rhinitis: Secondary | ICD-10-CM | POA: Insufficient documentation

## 2020-05-16 DIAGNOSIS — Z6841 Body Mass Index (BMI) 40.0 and over, adult: Secondary | ICD-10-CM | POA: Diagnosis not present

## 2020-05-16 NOTE — Progress Notes (Signed)
Chief Complaint:   OBESITY Margaret Porter is here to discuss her progress with her obesity treatment plan along with follow-up of her obesity related diagnoses. Margaret Porter is on the Category 3 Plan and states she is following her eating plan approximately 75% of the time. Margaret Porter states she is walking for 30 minutes 2-3 times per week.  Today's visit was #: 60 Starting weight: 262 lbs Starting date: 11/12/2018 Today's weight: 269 lbs Today's date: 05/16/2020 Total lbs lost to date: 0 Total lbs lost since last in-office visit: 0  Interim History: Margaret Porter is eating out about 3 times per week. She chooses fried food when she is eating out. She is still skipping some meals. She deviates significantly from her plan. She admits to drinking hot tea with sugar. She is packing her lunch  rather than eating out daily.  Subjective:   1. Seasonal allergies Margaret Porter takes Zyrtec daily. She notes cough and drainage in the back of her throat.  Assessment/Plan:   1. Seasonal allergies Margaret Porter agreed to add Flonase 2 sprays into each nostril daily and continue Zyrtec.  2. Class 3 severe obesity with serious comorbidity and body mass index (BMI) of 45.0 to 49.9 in adult, unspecified obesity type (HCC) Margaret Porter is currently in the action stage of change. As such, her goal is to continue with weight loss efforts. She has agreed to keeping a food journal and adhering to recommended goals of 1500-1600 calories and 90 grams of protein daily.   I demonstrated MyFitness Pal. Handout given today: Journaling.  Exercise goals: As is.  Behavioral modification strategies: increasing lean protein intake, decreasing simple carbohydrates, decreasing liquid calories, decreasing eating out, planning for success and keeping a strict food journal.  Margaret Porter has agreed to follow-up with our clinic in 3 weeks. She was informed of the importance of frequent follow-up visits to maximize her success with intensive  lifestyle modifications for her multiple health conditions.   Objective:   Blood pressure 117/76, pulse 67, temperature (!) 97.2 F (36.2 C), temperature source Oral, height 5\' 3"  (1.6 m), weight 269 lb (122 kg), SpO2 97 %. Body mass index is 47.65 kg/m.  General: Cooperative, alert, well developed, in no acute distress. HEENT: Conjunctivae and lids unremarkable. Cardiovascular: Regular rhythm.  Lungs: Normal work of breathing. Neurologic: No focal deficits.   Lab Results  Component Value Date   CREATININE 1.11 (H) 02/11/2020   BUN 19 02/11/2020   NA 140 02/11/2020   K 4.5 02/11/2020   CL 108 02/11/2020   CO2 25 02/11/2020   Lab Results  Component Value Date   ALT 22 02/11/2020   AST 19 02/11/2020   ALKPHOS 122 10/09/2019   BILITOT 0.4 02/11/2020   Lab Results  Component Value Date   HGBA1C 5.4 02/11/2020   HGBA1C 5.0 06/29/2019   HGBA1C 5.3 12/02/2018   HGBA1C 5.4 11/12/2018   HGBA1C 5.3 02/14/2018   Lab Results  Component Value Date   INSULIN 30.4 (H) 02/16/2020   INSULIN 18.2 06/29/2019   INSULIN 21.1 11/12/2018   Lab Results  Component Value Date   TSH 2.760 02/16/2020   Lab Results  Component Value Date   CHOL 201 (H) 02/11/2020   HDL 43 (L) 02/11/2020   LDLCALC 131 (H) 02/11/2020   TRIG 155 (H) 02/11/2020   CHOLHDL 4.7 02/11/2020   Lab Results  Component Value Date   WBC 4.4 02/11/2020   HGB 14.6 02/11/2020   HCT 42.9 02/11/2020   MCV 95.1 02/11/2020  PLT 221 02/11/2020   No results found for: IRON, TIBC, FERRITIN  Attestation Statements:   Reviewed by clinician on day of visit: allergies, medications, problem list, medical history, surgical history, family history, social history, and previous encounter notes.   Margaret Porter, am acting as Location manager for [employer], FNP-C.  I have reviewed the above documentation for accuracy and completeness, and I agree with the above. -  Margaret Fick, FNP

## 2020-06-06 ENCOUNTER — Encounter (INDEPENDENT_AMBULATORY_CARE_PROVIDER_SITE_OTHER): Payer: Self-pay | Admitting: Family Medicine

## 2020-06-06 ENCOUNTER — Ambulatory Visit (INDEPENDENT_AMBULATORY_CARE_PROVIDER_SITE_OTHER): Payer: BC Managed Care – PPO | Admitting: Family Medicine

## 2020-06-06 ENCOUNTER — Other Ambulatory Visit: Payer: Self-pay

## 2020-06-06 VITALS — BP 111/75 | HR 71 | Temp 98.2°F | Ht 63.0 in | Wt 269.0 lb

## 2020-06-06 DIAGNOSIS — Z9189 Other specified personal risk factors, not elsewhere classified: Secondary | ICD-10-CM | POA: Diagnosis not present

## 2020-06-06 DIAGNOSIS — E7849 Other hyperlipidemia: Secondary | ICD-10-CM

## 2020-06-06 DIAGNOSIS — E8881 Metabolic syndrome: Secondary | ICD-10-CM

## 2020-06-06 DIAGNOSIS — Z6841 Body Mass Index (BMI) 40.0 and over, adult: Secondary | ICD-10-CM

## 2020-06-06 DIAGNOSIS — E559 Vitamin D deficiency, unspecified: Secondary | ICD-10-CM

## 2020-06-06 NOTE — Progress Notes (Signed)
Chief Complaint:   OBESITY Margaret Porter is here to discuss her progress with her obesity treatment plan along with follow-up of her obesity related diagnoses. Margaret Porter is on keeping a food journal and adhering to recommended goals of 1500-1600 calories and 90 grams of protein daily and states she is following her eating plan approximately 70% of the time. Margaret Porter states she is walking for 30 minutes 3 times per week.  Today's visit was #: 77 Starting weight: 262 lbs Starting date: 11/12/2018 Today's weight: 269 lbs Today's date: 06/06/2020 Total lbs lost to date: 0 Total lbs lost since last in-office visit: 0  Interim History: [name] journaled a few days but then "fell off" from it. She notes eating out has increased. She is skipping breakfast at times. She has not done well overall on Category 3, Journaling, or the low carbohydrate plan.  Subjective:   1. Other hyperlipidemia Margaret Porter has hyperlipidemia and has been trying to improve her cholesterol levels with intensive lifestyle modification including a low saturated fat diet, exercise and weight loss. Last LDL was elevated at 131, HDL was low at 43, and triglycerides were high at 155. She is not on statin. She denies any chest pain, claudication or myalgias.  Lab Results  Component Value Date   ALT 22 02/11/2020   AST 19 02/11/2020   ALKPHOS 122 10/09/2019   BILITOT 0.4 02/11/2020   Lab Results  Component Value Date   CHOL 201 (H) 02/11/2020   HDL 43 (L) 02/11/2020   LDLCALC 131 (H) 02/11/2020   TRIG 155 (H) 02/11/2020   CHOLHDL 4.7 02/11/2020  The 10-year ASCVD risk score [name] DC Jr., et al., 2013) is: 1.7%   Values used to calculate the score:     Age: 1 years     Sex: Female     Is Non-Hispanic African American: Yes     Diabetic: No     Tobacco smoker: No     Systolic Blood Pressure: 628 mmHg     Is BP treated: No     HDL Cholesterol: 43 mg/dL     Total Cholesterol: 201 mg/dL  2. Insulin  resistance Margaret Porter has a diagnosis of insulin resistance based on her elevated fasting insulin level >5. She is not on metformin since she tends to skip meals. She denies polyphagia. She continues to work on diet and exercise to decrease her risk of diabetes.  Lab Results  Component Value Date   INSULIN 30.4 (H) 02/16/2020   INSULIN 18.2 06/29/2019   INSULIN 21.1 11/12/2018   Lab Results  Component Value Date   HGBA1C 5.4 02/11/2020   3. Vitamin D deficiency Margaret Porter's last Vit D level was low at 40.2. She is on prescription Vit D.  4. At risk for deficient intake of food The patient is at a higher than average risk of deficient intake of food due to skipping meals and lack of protein.  Assessment/Plan:   1. Other hyperlipidemia Cardiovascular risk and specific lipid/LDL goals reviewed. We discussed several lifestyle modifications today. Breah will continue to work on diet, exercise and weight loss efforts. We will check labs today.  - Comprehensive metabolic panel - Lipid Panel With LDL/HDL Ratio  2. Insulin resistance Margaret Porter will continue to work on weight loss, exercise, and decreasing simple carbohydrates to help decrease the risk of diabetes. We will check labs today. Margaret Porter agreed to follow-up with Margaret Porter as directed to closely monitor her progress.  - Comprehensive metabolic panel - Hemoglobin A1c -  Insulin, random  3. Vitamin D deficiency Low Vitamin D level contributes to fatigue and are associated with obesity, breast, and colon cancer. [name] agreed to continue taking prescription Vitamin D 50,000 IU every week and will follow-up for routine testing of Vitamin D, at least 2-3 times per year to avoid over-replacement. We will check labs today.  - VITAMIN D 25 Hydroxy (Vit-D Deficiency, Fractures)  4. At risk for deficient intake of food Margaret Porter was given approximately 15 minutes of deficit intake of food prevention counseling today. Margaret Porter is at risk for  eating too few calories based on current food recall. She was encouraged to focus on meeting caloric and protein goals according to her recommended meal plan.   5. Class 3 severe obesity with serious comorbidity and body mass index (BMI) of 45.0 to 49.9 in adult, unspecified obesity type (HCC) Margaret Porter is currently in the action stage of change. As such, her goal is to continue with weight loss efforts. She has agreed to keeping a food journal and adhering to recommended goals of 1500-1600 calories and 90 grams of protein daily.   Handout given today: Smart Fruit.  Exercise goals: As is.  Behavioral modification strategies: increasing lean protein intake, no skipping meals and planning for success.  Margaret Porter has agreed to follow-up with our clinic in 3 weeks. She was informed of the importance of frequent follow-up visits to maximize her success with intensive lifestyle modifications for her multiple health conditions.   Margaret Porter was informed we would discuss her lab results at her next visit unless there is a critical issue that needs to be addressed sooner. Margaret Porter agreed to keep her next visit at the agreed upon time to discuss these results.  Objective:   Blood pressure 111/75, pulse 71, temperature 98.2 F (36.8 C), temperature source Oral, height 5\' 3"  (1.6 m), weight 269 lb (122 kg), SpO2 96 %. Body mass index is 47.65 kg/m.  General: Cooperative, alert, well developed, in no acute distress. HEENT: Conjunctivae and lids unremarkable. Cardiovascular: Regular rhythm.  Lungs: Normal work of breathing. Neurologic: No focal deficits.   Lab Results  Component Value Date   CREATININE 1.11 (H) 02/11/2020   BUN 19 02/11/2020   NA 140 02/11/2020   K 4.5 02/11/2020   CL 108 02/11/2020   CO2 25 02/11/2020   Lab Results  Component Value Date   ALT 22 02/11/2020   AST 19 02/11/2020   ALKPHOS 122 10/09/2019   BILITOT 0.4 02/11/2020   Lab Results  Component Value Date   HGBA1C  5.4 02/11/2020   HGBA1C 5.0 06/29/2019   HGBA1C 5.3 12/02/2018   HGBA1C 5.4 11/12/2018   HGBA1C 5.3 02/14/2018   Lab Results  Component Value Date   INSULIN 30.4 (H) 02/16/2020   INSULIN 18.2 06/29/2019   INSULIN 21.1 11/12/2018   Lab Results  Component Value Date   TSH 2.760 02/16/2020   Lab Results  Component Value Date   CHOL 201 (H) 02/11/2020   HDL 43 (L) 02/11/2020   LDLCALC 131 (H) 02/11/2020   TRIG 155 (H) 02/11/2020   CHOLHDL 4.7 02/11/2020   Lab Results  Component Value Date   WBC 4.4 02/11/2020   HGB 14.6 02/11/2020   HCT 42.9 02/11/2020   MCV 95.1 02/11/2020   PLT 221 02/11/2020   No results found for: IRON, TIBC, FERRITIN  Attestation Statements:   Reviewed by clinician on day of visit: allergies, medications, problem list, medical history, surgical history, family history, social history, and  previous encounter notes.   Wilhemena Durie, am acting as Location manager for [employer], FNP-C.  I have reviewed the above documentation for accuracy and completeness, and I agree with the above. -  Georgianne Fick, FNP

## 2020-06-07 LAB — COMPREHENSIVE METABOLIC PANEL
ALT: 18 IU/L (ref 0–32)
AST: 17 IU/L (ref 0–40)
Albumin/Globulin Ratio: 2.3 — ABNORMAL HIGH (ref 1.2–2.2)
Albumin: 4.5 g/dL (ref 3.8–4.9)
Alkaline Phosphatase: 135 IU/L — ABNORMAL HIGH (ref 48–121)
BUN/Creatinine Ratio: 13 (ref 9–23)
BUN: 13 mg/dL (ref 6–24)
Bilirubin Total: 0.3 mg/dL (ref 0.0–1.2)
CO2: 25 mmol/L (ref 20–29)
Calcium: 9.4 mg/dL (ref 8.7–10.2)
Chloride: 103 mmol/L (ref 96–106)
Creatinine, Ser: 1 mg/dL (ref 0.57–1.00)
GFR calc Af Amer: 75 mL/min/{1.73_m2} (ref >59)
GFR calc non Af Amer: 65 mL/min/{1.73_m2} (ref >59)
Globulin, Total: 2 g/dL (ref 1.5–4.5)
Glucose: 111 mg/dL — ABNORMAL HIGH (ref 65–99)
Potassium: 4.4 mmol/L (ref 3.5–5.2)
Sodium: 141 mmol/L (ref 134–144)
Total Protein: 6.5 g/dL (ref 6.0–8.5)

## 2020-06-07 LAB — LIPID PANEL WITH LDL/HDL RATIO
Cholesterol, Total: 213 mg/dL — ABNORMAL HIGH (ref 100–199)
HDL: 43 mg/dL (ref >39)
LDL Chol Calc (NIH): 141 mg/dL — ABNORMAL HIGH (ref 0–99)
LDL/HDL Ratio: 3.3 ratio — ABNORMAL HIGH (ref 0.0–3.2)
Triglycerides: 160 mg/dL — ABNORMAL HIGH (ref 0–149)
VLDL Cholesterol Cal: 29 mg/dL (ref 5–40)

## 2020-06-07 LAB — INSULIN, RANDOM: INSULIN: 35.7 u[IU]/mL — ABNORMAL HIGH (ref 2.6–24.9)

## 2020-06-07 LAB — VITAMIN D 25 HYDROXY (VIT D DEFICIENCY, FRACTURES): Vit D, 25-Hydroxy: 42.5 ng/mL (ref 30.0–100.0)

## 2020-06-07 LAB — HEMOGLOBIN A1C
Est. average glucose Bld gHb Est-mCnc: 108 mg/dL
Hgb A1c MFr Bld: 5.4 % (ref 4.8–5.6)

## 2020-06-27 ENCOUNTER — Encounter (INDEPENDENT_AMBULATORY_CARE_PROVIDER_SITE_OTHER): Payer: Self-pay | Admitting: Family Medicine

## 2020-06-27 ENCOUNTER — Other Ambulatory Visit: Payer: Self-pay

## 2020-06-27 ENCOUNTER — Ambulatory Visit (INDEPENDENT_AMBULATORY_CARE_PROVIDER_SITE_OTHER): Payer: BC Managed Care – PPO | Admitting: Family Medicine

## 2020-06-27 VITALS — BP 130/79 | HR 101 | Temp 98.0°F | Ht 63.0 in | Wt 270.0 lb

## 2020-06-27 DIAGNOSIS — E559 Vitamin D deficiency, unspecified: Secondary | ICD-10-CM | POA: Diagnosis not present

## 2020-06-27 DIAGNOSIS — Z6841 Body Mass Index (BMI) 40.0 and over, adult: Secondary | ICD-10-CM

## 2020-06-28 NOTE — Progress Notes (Signed)
Chief Complaint:   OBESITY Margaret Porter is here to discuss her progress with her obesity treatment plan along with follow-up of her obesity related diagnoses. Margaret Porter is on keeping a food journal and adhering to recommended goals of 1500-1600 calories and 90 grams of protein daily and states she is following her eating plan approximately 70-75% of the time. Margaret Porter states she is walking for 50 minutes 4 times per week.  Today's visit was #: 60 Starting weight: 262 lbs Starting date: 11/12/2018 Today's weight: 270 lbs Today's date: 06/27/2020 Total lbs lost to date: 0 Total lbs lost since last in-office visit: 0  Interim History: Margaret Porter notes she has been skipping breakfast since school is out. She is Pharmacist, hospital. She is snacking on simple carbohydrates. She knows what to do but she struggles to follow through.  Subjective:   1. Vitamin D deficiency Margaret Porter's Vit D level is nearly at goal at 42.5. She is on prescription Vit D and OTC Vit D. I discussed labs with the patient today.  Assessment/Plan:   1. Vitamin D deficiency Low Vitamin D level contributes to fatigue and are associated with obesity, breast, and colon cancer. Margaret Porter agreed to continue taking both prescription Vitamin D and OTC Vit D, and will follow-up for routine testing of Vitamin D, at least 2-3 times per year to avoid over-replacement.  2. Class 3 severe obesity with serious comorbidity and body mass index (BMI) of 45.0 to 49.9 in adult, unspecified obesity type (HCC) Margaret Porter is currently in the action stage of change. As such, her goal is to continue with weight loss efforts. She has agreed to the Category 2 Plan and keeping a food journal and adhering to recommended goals of 250-350 calories and 25 grams of protein at breakfast daily.   Handout given today: Smart Fruit.  Exercise goals: As is.  Behavioral modification strategies: increasing lean protein intake, decreasing simple carbohydrates and decreasing  eating out.  Margaret Porter has agreed to follow-up with our clinic in 4 weeks. She was informed of the importance of frequent follow-up visits to maximize her success with intensive lifestyle modifications for her multiple health conditions.   Objective:   Blood pressure 130/79, pulse (!) 101, temperature 98 F (36.7 C), temperature source Oral, height 5\' 3"  (1.6 m), weight 270 lb (122.5 kg), SpO2 97 %. Body mass index is 47.83 kg/m.  General: Cooperative, alert, well developed, in no acute distress. HEENT: Conjunctivae and lids unremarkable. Cardiovascular: Regular rhythm.  Lungs: Normal work of breathing. Neurologic: No focal deficits.   Lab Results  Component Value Date   CREATININE 1.00 06/06/2020   BUN 13 06/06/2020   NA 141 06/06/2020   K 4.4 06/06/2020   CL 103 06/06/2020   CO2 25 06/06/2020   Lab Results  Component Value Date   ALT 18 06/06/2020   AST 17 06/06/2020   ALKPHOS 135 (H) 06/06/2020   BILITOT 0.3 06/06/2020   Lab Results  Component Value Date   HGBA1C 5.4 06/06/2020   HGBA1C 5.4 02/11/2020   HGBA1C 5.0 06/29/2019   HGBA1C 5.3 12/02/2018   HGBA1C 5.4 11/12/2018   Lab Results  Component Value Date   INSULIN 35.7 (H) 06/06/2020   INSULIN 30.4 (H) 02/16/2020   INSULIN 18.2 06/29/2019   INSULIN 21.1 11/12/2018   Lab Results  Component Value Date   TSH 2.760 02/16/2020   Lab Results  Component Value Date   CHOL 213 (H) 06/06/2020   HDL 43 06/06/2020   LDLCALC 141 (  H) 06/06/2020   TRIG 160 (H) 06/06/2020   CHOLHDL 4.7 02/11/2020   Lab Results  Component Value Date   WBC 4.4 02/11/2020   HGB 14.6 02/11/2020   HCT 42.9 02/11/2020   MCV 95.1 02/11/2020   PLT 221 02/11/2020   No results found for: IRON, TIBC, FERRITIN  Attestation Statements:   Reviewed by clinician on day of visit: allergies, medications, problem list, medical history, surgical history, family history, social history, and previous encounter notes.   Wilhemena Durie, am  acting as Location manager for [employer], FNP-C.  I have reviewed the above documentation for accuracy and completeness, and I agree with the above. -  Georgianne Fick, FNP

## 2020-06-29 ENCOUNTER — Encounter (INDEPENDENT_AMBULATORY_CARE_PROVIDER_SITE_OTHER): Payer: Self-pay | Admitting: Family Medicine

## 2020-07-19 ENCOUNTER — Other Ambulatory Visit: Payer: Self-pay

## 2020-07-19 ENCOUNTER — Encounter (INDEPENDENT_AMBULATORY_CARE_PROVIDER_SITE_OTHER): Payer: Self-pay | Admitting: *Deleted

## 2020-07-19 VITALS — BP 121/84 | HR 76 | Temp 98.3°F | Wt 271.4 lb

## 2020-07-19 DIAGNOSIS — Z006 Encounter for examination for normal comparison and control in clinical research program: Secondary | ICD-10-CM

## 2020-07-19 NOTE — Research (Signed)
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 She denies any new problems except for sunburn from [place] in [place] recently. She will be returning in October for the last visit for [facility].

## 2020-07-20 LAB — COMPREHENSIVE METABOLIC PANEL
AG Ratio: 2.1 (calc) (ref 1.0–2.5)
ALT: 15 U/L (ref 6–29)
AST: 17 U/L (ref 10–35)
Albumin: 4.2 g/dL (ref 3.6–5.1)
Alkaline phosphatase (APISO): 113 U/L (ref 37–153)
BUN/Creatinine Ratio: 16 (calc) (ref 6–22)
BUN: 18 mg/dL (ref 7–25)
CO2: 21 mmol/L (ref 20–32)
Calcium: 8.9 mg/dL (ref 8.6–10.4)
Chloride: 104 mmol/L (ref 98–110)
Creat: 1.1 mg/dL — ABNORMAL HIGH (ref 0.50–1.05)
Globulin: 2 g/dL (calc) (ref 1.9–3.7)
Glucose, Bld: 109 mg/dL — ABNORMAL HIGH (ref 65–99)
Potassium: 4.3 mmol/L (ref 3.5–5.3)
Sodium: 139 mmol/L (ref 135–146)
Total Bilirubin: 0.4 mg/dL (ref 0.2–1.2)
Total Protein: 6.2 g/dL (ref 6.1–8.1)

## 2020-07-20 LAB — HEPATITIS C ANTIBODY
Hepatitis C Ab: NONREACTIVE
SIGNAL TO CUT-OFF: 0.01 (ref <1.00)

## 2020-07-25 ENCOUNTER — Encounter (INDEPENDENT_AMBULATORY_CARE_PROVIDER_SITE_OTHER): Payer: Self-pay | Admitting: Family Medicine

## 2020-07-25 ENCOUNTER — Encounter (INDEPENDENT_AMBULATORY_CARE_PROVIDER_SITE_OTHER): Payer: Self-pay

## 2020-07-25 ENCOUNTER — Ambulatory Visit (INDEPENDENT_AMBULATORY_CARE_PROVIDER_SITE_OTHER): Payer: BC Managed Care – PPO | Admitting: Family Medicine

## 2020-07-25 ENCOUNTER — Other Ambulatory Visit: Payer: Self-pay

## 2020-07-25 VITALS — BP 121/81 | HR 73 | Temp 98.1°F | Ht 63.0 in | Wt 268.0 lb

## 2020-07-25 DIAGNOSIS — Z9189 Other specified personal risk factors, not elsewhere classified: Secondary | ICD-10-CM

## 2020-07-25 DIAGNOSIS — E8881 Metabolic syndrome: Secondary | ICD-10-CM

## 2020-07-25 DIAGNOSIS — E559 Vitamin D deficiency, unspecified: Secondary | ICD-10-CM | POA: Diagnosis not present

## 2020-07-25 DIAGNOSIS — Z6841 Body Mass Index (BMI) 40.0 and over, adult: Secondary | ICD-10-CM

## 2020-07-25 MED ORDER — WEGOVY 0.25 MG/0.5ML ~~LOC~~ SOAJ: 0.2500 mg | SUBCUTANEOUS | 0 refills | Status: DC

## 2020-07-25 MED ORDER — VITAMIN D (ERGOCALCIFEROL) 1.25 MG (50000 UNIT) PO CAPS: 50000.0000 [IU] | ORAL_CAPSULE | ORAL | 0 refills | Status: DC

## 2020-07-25 NOTE — Progress Notes (Signed)
Chief Complaint:   OBESITY Margaret Porter is here to discuss her progress with her obesity treatment plan along with follow-up of her obesity related diagnoses. Margaret Porter is on the Category 2 Plan and keeping a food journal and adhering to recommended goals of 250-350 calories and 25 grams of protein at breakfast daily and states she is following her eating plan approximately 70% of the time. Margaret Porter states she is walking for 50 minutes 4 times per week.  Today's visit was #: 60 Starting weight: 262 lbs Starting date: 11/12/2018 Today's weight: 268 lbs Today's date: 07/25/2020 Total lbs lost to date: 0 Total lbs lost since last in-office visit: 2  Interim History: Margaret Porter is skipping breakfast most days because she sleeps late. She follows the plan very little based on food recall. She is eating out quite a bit. She struggles with motivation to prepare food.  Subjective:   1. Vitamin D deficiency Margaret Porter's last Vit D was low at 42.5. She is on prescription Vit D.  2. Insulin resistance Margaret Porter has a diagnosis of insulin resistance based on her elevated fasting insulin level >5. She notes polyphagia at times. Last A1c was 5.4. She continues to work on diet and exercise to decrease her risk of diabetes.  Lab Results  Component Value Date   INSULIN 35.7 (H) 06/06/2020   INSULIN 30.4 (H) 02/16/2020   INSULIN 18.2 06/29/2019   INSULIN 21.1 11/12/2018   Lab Results  Component Value Date   HGBA1C 5.4 06/06/2020   3. At risk for diabetes mellitus Margaret Porter is at higher than average risk for developing diabetes due to her obesity, insulin resistance, and family hx (both parents and a brother have diabetes). .   Assessment/Plan:   1. Vitamin D deficiency Low Vitamin D level contributes to fatigue and are associated with obesity, breast, and colon cancer. We will refill prescription Vitamin D for 1 month. Margaret Porter will follow-up for routine testing of Vitamin D, at least 2-3 times per  year to avoid over-replacement.  - Vitamin D, Ergocalciferol, (DRISDOL) 1.25 MG (50000 UNIT) CAPS capsule; Take 1 capsule (50,000 Units total) by mouth every 7 (seven) days.  Dispense: 4 capsule; Refill: 0  2. Insulin resistance Margaret Porter will continue her meal plan, and will continue to work on weight loss, exercise, and decreasing simple carbohydrates to help decrease the risk of diabetes. Margaret Porter agreed to follow-up with [name] as directed to closely monitor her progress.  3. At risk for diabetes mellitus Margaret Porter was given approximately 15 minutes of diabetes education and counseling today. We discussed intensive lifestyle modifications today with an emphasis on weight loss as well as increasing exercise and decreasing simple carbohydrates in her diet. We also reviewed medication options with an emphasis on risk versus benefit of those discussed.   Repetitive spaced learning was employed today to elicit superior memory formation and behavioral change.  4. Class 3 severe obesity with serious comorbidity and body mass index (BMI) of 45.0 to 49.9 in adult, unspecified obesity type (HCC) Margaret Porter is currently in the action stage of change. As such, her goal is to continue with weight loss efforts. She has agreed to the Category 2 Plan and keeping a food journal and adhering to recommended goals of 250-350 calories and 25 grams of protein at breakfast daily.   We discussed strategies to reduce meal skipping.  We discussed various medication options to help Ebonique with her weight loss efforts and we both agreed to start Wegovy 0.25 mg SubQ weekly  with no refills. Margaret Porter denies personal or family history of thyroid cancer, history of pancreatitis, or current cholelithiasis. She was informed of side effects.  - Semaglutide-Weight Management (WEGOVY) 0.25 MG/0.5ML SOAJ; Inject 0.25 mg into the skin once a week.  Dispense: 2 mL; Refill: 0  Exercise goals: As is.  Behavioral modification strategies:  no skipping meals and meal planning and cooking strategies.  Margaret Porter has agreed to follow-up with our clinic in 2 weeks. She was informed of the importance of frequent follow-up visits to maximize her success with intensive lifestyle modifications for her multiple health conditions.   Objective:   Blood pressure 121/81, pulse 73, temperature 98.1 F (36.7 C), temperature source Oral, height 5\' 3"  (1.6 m), weight (!) 268 lb (121.6 kg), SpO2 98 %. Body mass index is 47.47 kg/m.  General: Cooperative, alert, well developed, in no acute distress. HEENT: Conjunctivae and lids unremarkable. Cardiovascular: Regular rhythm.  Lungs: Normal work of breathing. Neurologic: No focal deficits.   Lab Results  Component Value Date   CREATININE 1.10 (H) 07/19/2020   BUN 18 07/19/2020   NA 139 07/19/2020   K 4.3 07/19/2020   CL 104 07/19/2020   CO2 21 07/19/2020   Lab Results  Component Value Date   ALT 15 07/19/2020   AST 17 07/19/2020   ALKPHOS 135 (H) 06/06/2020   BILITOT 0.4 07/19/2020   Lab Results  Component Value Date   HGBA1C 5.4 06/06/2020   HGBA1C 5.4 02/11/2020   HGBA1C 5.0 06/29/2019   HGBA1C 5.3 12/02/2018   HGBA1C 5.4 11/12/2018   Lab Results  Component Value Date   INSULIN 35.7 (H) 06/06/2020   INSULIN 30.4 (H) 02/16/2020   INSULIN 18.2 06/29/2019   INSULIN 21.1 11/12/2018   Lab Results  Component Value Date   TSH 2.760 02/16/2020   Lab Results  Component Value Date   CHOL 213 (H) 06/06/2020   HDL 43 06/06/2020   LDLCALC 141 (H) 06/06/2020   TRIG 160 (H) 06/06/2020   CHOLHDL 4.7 02/11/2020   Lab Results  Component Value Date   WBC 4.4 02/11/2020   HGB 14.6 02/11/2020   HCT 42.9 02/11/2020   MCV 95.1 02/11/2020   PLT 221 02/11/2020   No results found for: IRON, TIBC, FERRITIN  Attestation Statements:   Reviewed by clinician on day of visit: allergies, medications, problem list, medical history, surgical history, family history, social history, and  previous encounter notes.   Wilhemena Durie, am acting as Location manager for [employer], FNP-C.  I have reviewed the above documentation for accuracy and completeness, and I agree with the above. -  Georgianne Fick, FNP

## 2020-07-29 ENCOUNTER — Encounter: Payer: Self-pay | Admitting: Infectious Disease

## 2020-07-29 LAB — CD4/CD8 (T-HELPER/T-SUPPRESSOR CELL)
CD4 % Helper T Cell: 43.1
CD4 Count: 603
CD8 % Suppressor T Cell: 40.4
CD8 T Cell Abs: 566
HIV 1 RNA UltraQuant: <40

## 2020-08-08 ENCOUNTER — Other Ambulatory Visit: Payer: Self-pay

## 2020-08-08 ENCOUNTER — Encounter (INDEPENDENT_AMBULATORY_CARE_PROVIDER_SITE_OTHER): Payer: Self-pay | Admitting: Family Medicine

## 2020-08-08 ENCOUNTER — Ambulatory Visit (INDEPENDENT_AMBULATORY_CARE_PROVIDER_SITE_OTHER): Payer: BC Managed Care – PPO | Admitting: Family Medicine

## 2020-08-08 VITALS — BP 132/83 | Ht 63.0 in | Wt 270.0 lb

## 2020-08-08 DIAGNOSIS — Z6841 Body Mass Index (BMI) 40.0 and over, adult: Secondary | ICD-10-CM

## 2020-08-08 DIAGNOSIS — E8881 Metabolic syndrome: Secondary | ICD-10-CM

## 2020-08-08 MED ORDER — WEGOVY 0.25 MG/0.5ML ~~LOC~~ SOAJ: 0.2500 mg | SUBCUTANEOUS | 0 refills | Status: DC

## 2020-08-09 ENCOUNTER — Encounter (INDEPENDENT_AMBULATORY_CARE_PROVIDER_SITE_OTHER): Payer: Self-pay | Admitting: Family Medicine

## 2020-08-09 NOTE — Progress Notes (Signed)
Chief Complaint:   OBESITY Margaret Porter is here to discuss her progress with her obesity treatment plan along with follow-up of her obesity related diagnoses. Margaret Porter is on the Category 2 Plan and keeping a food journal and adhering to recommended goals of 250-350 calories and 25 grams of protein at breakfast daily and states she is following her eating plan approximately 70% of the time. Margaret Porter states she is walking for 60 minutes 4 times per week.  Today's visit was #: 8 Starting weight: 262 lbs Starting date: 11/12/2018 Today's weight: 270 lbs Today's date: 08/08/2020 Total lbs lost to date: 0 Total lbs lost since last in-office visit: 0  Interim History: Margaret Porter has not started [facility] due to supply issues. She notes she has not done well on the plan. She is skipping breakfast most days. She is deviating from the plan significantly. She still lacks motivation at times to prepare food. She is drinking sugar sweetened beverages such as juice and soda.  Subjective:   1. Insulin resistance Margaret Porter has a diagnosis of insulin resistance based on her elevated fasting insulin level >5. She notes some hunger and cravings for chips and sweets. She continues to work on diet and exercise to decrease her risk of diabetes.  Lab Results  Component Value Date   INSULIN 35.7 (H) 06/06/2020   INSULIN 30.4 (H) 02/16/2020   INSULIN 18.2 06/29/2019   INSULIN 21.1 11/12/2018   Lab Results  Component Value Date   HGBA1C 5.4 06/06/2020   Assessment/Plan:   1. Insulin resistance Margaret Porter will continue to work on weight loss, exercise, and decreasing simple carbohydrates to help decrease the risk of diabetes. we will try Wegovy to reduce hunger and cravings. Margaret Porter agreed to follow-up with [name] as directed to closely monitor her progress.  2. Class 3 severe obesity with serious comorbidity and body mass index (BMI) of 45.0 to 49.9 in adult, unspecified obesity type (HCC) Margaret Porter is currently  in the action stage of change. As such, her goal is to continue with weight loss efforts. She has agreed to the Category 3 Plan.   We discussed various medication options to help Margaret Porter with her weight loss efforts and we both agreed to start Wegovy 0.25 mg SubQ weekly with no refills. We will send Wegovy to [place] where they are trying to keep [facility] in stock.   - Semaglutide-Weight Management (WEGOVY) 0.25 MG/0.5ML SOAJ; Inject 0.25 mg into the skin once a week.  Dispense: 2 mL; Refill: 0  Exercise goals: As is.  Behavioral modification strategies: increasing lean protein intake, decreasing liquid calories, no skipping meals, meal planning and cooking strategies and keeping healthy foods in the home.  Margaret Porter has agreed to follow-up with our clinic in 2 to 3 weeks. She was informed of the importance of frequent follow-up visits to maximize her success with intensive lifestyle modifications for her multiple health conditions.   Objective:   Height 5\' 3"  (1.6 m), weight 270 lb (122.5 kg). Body mass index is 47.83 kg/m.  General: Cooperative, alert, well developed, in no acute distress. HEENT: Conjunctivae and lids unremarkable. Cardiovascular: Regular rhythm.  Lungs: Normal work of breathing. Neurologic: No focal deficits.   Lab Results  Component Value Date   CREATININE 1.10 (H) 07/19/2020   BUN 18 07/19/2020   NA 139 07/19/2020   K 4.3 07/19/2020   CL 104 07/19/2020   CO2 21 07/19/2020   Lab Results  Component Value Date   ALT 15 07/19/2020  AST 17 07/19/2020   ALKPHOS 135 (H) 06/06/2020   BILITOT 0.4 07/19/2020   Lab Results  Component Value Date   HGBA1C 5.4 06/06/2020   HGBA1C 5.4 02/11/2020   HGBA1C 5.0 06/29/2019   HGBA1C 5.3 12/02/2018   HGBA1C 5.4 11/12/2018   Lab Results  Component Value Date   INSULIN 35.7 (H) 06/06/2020   INSULIN 30.4 (H) 02/16/2020   INSULIN 18.2 06/29/2019   INSULIN 21.1 11/12/2018   Lab Results  Component Value  Date   TSH 2.760 02/16/2020   Lab Results  Component Value Date   CHOL 213 (H) 06/06/2020   HDL 43 06/06/2020   LDLCALC 141 (H) 06/06/2020   TRIG 160 (H) 06/06/2020   CHOLHDL 4.7 02/11/2020   Lab Results  Component Value Date   WBC 4.4 02/11/2020   HGB 14.6 02/11/2020   HCT 42.9 02/11/2020   MCV 95.1 02/11/2020   PLT 221 02/11/2020   No results found for: IRON, TIBC, FERRITIN  Attestation Statements:   Reviewed by clinician on day of visit: allergies, medications, problem list, medical history, surgical history, family history, social history, and previous encounter notes.   Wilhemena Durie, am acting as Location manager for [employer], FNP-C.  I have reviewed the above documentation for accuracy and completeness, and I agree with the above. -  Georgianne Fick, FNP

## 2020-08-16 ENCOUNTER — Encounter (INDEPENDENT_AMBULATORY_CARE_PROVIDER_SITE_OTHER): Payer: Self-pay | Admitting: Family Medicine

## 2020-08-25 ENCOUNTER — Encounter (INDEPENDENT_AMBULATORY_CARE_PROVIDER_SITE_OTHER): Payer: Self-pay | Admitting: Family Medicine

## 2020-08-27 ENCOUNTER — Other Ambulatory Visit (INDEPENDENT_AMBULATORY_CARE_PROVIDER_SITE_OTHER): Payer: Self-pay | Admitting: Family Medicine

## 2020-08-27 DIAGNOSIS — E559 Vitamin D deficiency, unspecified: Secondary | ICD-10-CM

## 2020-08-29 ENCOUNTER — Ambulatory Visit (INDEPENDENT_AMBULATORY_CARE_PROVIDER_SITE_OTHER): Payer: BC Managed Care – PPO | Admitting: Family Medicine

## 2020-08-30 ENCOUNTER — Other Ambulatory Visit: Payer: BC Managed Care – PPO

## 2020-08-30 ENCOUNTER — Encounter: Payer: Self-pay | Admitting: Medical

## 2020-08-30 ENCOUNTER — Other Ambulatory Visit: Payer: Self-pay

## 2020-08-30 ENCOUNTER — Ambulatory Visit
Admission: RE | Admit: 2020-08-30 | Discharge: 2020-08-30 | Disposition: A | Discharge status: Home / Self Care | Payer: BC Managed Care – PPO | Source: Ambulatory Visit | Attending: Medical | Admitting: Medical

## 2020-08-30 ENCOUNTER — Telehealth: Payer: BC Managed Care – PPO | Admitting: Medical

## 2020-08-30 VITALS — Ht 63.0 in | Wt 269.0 lb

## 2020-08-30 DIAGNOSIS — R509 Fever, unspecified: Secondary | ICD-10-CM

## 2020-08-30 DIAGNOSIS — R05 Cough: Secondary | ICD-10-CM | POA: Diagnosis not present

## 2020-08-30 DIAGNOSIS — R059 Cough, unspecified: Secondary | ICD-10-CM

## 2020-08-30 DIAGNOSIS — Z20822 Contact with and (suspected) exposure to covid-19: Secondary | ICD-10-CM

## 2020-08-30 MED ORDER — PROMETHAZINE-DM 6.25-15 MG/5ML PO SYRP: 5.0000 mL | ORAL_SOLUTION | Freq: Four times a day (QID) | ORAL | 0 refills | Status: DC | PRN

## 2020-08-30 NOTE — Addendum Note (Signed)
Addended by: Carlena Hurl on: 08/30/2020 02:40 PM   Modules accepted: Orders

## 2020-08-30 NOTE — Progress Notes (Signed)
done

## 2020-08-30 NOTE — Progress Notes (Addendum)
Subjective:     Patient ID: Margaret Porter, female   DOB: [birth year], 50 y.o.   MRN: [id number]  This visit type was conducted due to national recommendations for restrictions regarding the COVID-19 Pandemic (e.g. social distancing) in an effort to limit this patient's exposure and mitigate transmission in our community.  Due to their co-morbid illnesses, this patient is at least at moderate risk for complications without adequate follow up.  This format is felt to be most appropriate for this patient at this time.    Documentation for virtual audio and video telecommunications through [place] encounter:  The patient was located at home. The provider was located in the office. The patient did consent to this visit and is aware of possible charges through their insurance for this visit.  The other persons participating in this telemedicine service were none. Time spent on call was 20 minutes and in review of previous records 20 minutes total.  This virtual service is not related to other E/M service within previous 7 days.   HPI Chief Complaint  Patient presents with  . Cough    with congestion    Virtual consult for not feeling well.  Started feeling bad last week.    Started a week ago with sore throat, slight fever, sweats but attributed some of the fever and sweats to her menstrual period that started last week.  Progressed to more cough and congestion.  Getting some more production with cough.  No recent change in smell or taste.  Some chills.  No nausea or vomiting.  Using mucinex DM.  Has not taken a covid test.  Is vaccinated.    She gets like this every year, so assumed this was her yearly respirator episode.    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  Says she just assumed it was something in the air.  However she gets these episodes a couple times per year and is  coughing at home.  She has no pets at home  No other aggravating or relieving factors. No other complaint.   Past Medical History:  Diagnosis Date  . Allergic rhinitis   . Allergy   . Food allergy   . HIV infection ([place]) 2006  . Idiopathic urticaria   . Joint pain   . Lytic bone lesions on xray    bone lesions on hip,sternum and spine  . Obesity   . Pre-diabetes    pt not on meds  . Rosacea   . Sickle cell anemia (HCC)    Sickle cell trait with mom  . Swelling of both lower extremities    per pt, left side is more swollen  . Vitamin D deficiency     Review of Systems As in subjective    Objective:   Physical Exam Due to coronavirus pandemic stay at home measures, patient visit was virtual and they were not examined in person.   Ht 5\' 3"  (1.6 m)   Wt 269 lb (122 kg)   BMI 47.65 kg/m  General: Well-developed well-nourished no acute distress, seated comfortably but coughing     Assessment:     Encounter Diagnoses  Name Primary?  . Cough Yes  . Fever, unspecified fever cause        Plan:     Given her symptoms and concerns, we need to rule out infection.  Given  her symptoms I doubt this is an allergy but more likely an infection.  I have asked her to go get a chest x-ray at [facility] now as well as going get a Covid test.  We discussed locations for Covid testing.  Advise she be at home quarantining until we have some results.   We should assume Covid until we rule this out.  Advise rest, hydration, can use Mucinex DM or cough medicine below for worse cough.  We discussed you can still acquire Covid even after having been vaccinated  If Covid test comes back positive we will refer to infusion clinic  Given the sputum description we may end up using antibiotic if Covid test is negative depending on x-ray results.  We discussed quarantine  Zelena was seen today for cough.  Diagnoses and all orders for this visit:  Cough -     DG Chest 2 View;  Future  Fever, unspecified fever cause -     DG Chest 2 View; Future  Other orders -     promethazine-dextromethorphan (PROMETHAZINE-DM) 6.25-15 MG/5ML syrup; Take 5 mLs by mouth 4 (four) times daily as needed for cough.  Follow-up pending x-ray results and Covid test

## 2020-08-31 LAB — NOVEL CORONAVIRUS, NAA: SARS-CoV-2, NAA: NOT DETECTED

## 2020-09-01 ENCOUNTER — Other Ambulatory Visit: Payer: Self-pay | Admitting: Medical

## 2020-09-01 MED ORDER — CLARITHROMYCIN 500 MG PO TABS: 500.0000 mg | ORAL_TABLET | Freq: Two times a day (BID) | ORAL | 0 refills | Status: DC

## 2020-09-09 ENCOUNTER — Other Ambulatory Visit: Payer: Self-pay | Admitting: Internal Medicine

## 2020-09-09 DIAGNOSIS — B20 Human immunodeficiency virus [HIV] disease: Secondary | ICD-10-CM

## 2020-10-03 ENCOUNTER — Other Ambulatory Visit: Payer: Self-pay

## 2020-10-03 ENCOUNTER — Encounter (INDEPENDENT_AMBULATORY_CARE_PROVIDER_SITE_OTHER): Payer: Self-pay | Admitting: Family Medicine

## 2020-10-03 ENCOUNTER — Ambulatory Visit (INDEPENDENT_AMBULATORY_CARE_PROVIDER_SITE_OTHER): Payer: BC Managed Care – PPO | Admitting: Family Medicine

## 2020-10-03 VITALS — BP 120/85 | HR 62 | Temp 97.8°F | Ht 63.0 in | Wt 265.0 lb

## 2020-10-03 DIAGNOSIS — E8881 Metabolic syndrome: Secondary | ICD-10-CM

## 2020-10-03 DIAGNOSIS — Z6841 Body Mass Index (BMI) 40.0 and over, adult: Secondary | ICD-10-CM | POA: Diagnosis not present

## 2020-10-03 DIAGNOSIS — E88819 Insulin resistance, unspecified: Secondary | ICD-10-CM

## 2020-10-03 DIAGNOSIS — Z9189 Other specified personal risk factors, not elsewhere classified: Secondary | ICD-10-CM

## 2020-10-03 DIAGNOSIS — E559 Vitamin D deficiency, unspecified: Secondary | ICD-10-CM | POA: Diagnosis not present

## 2020-10-03 DIAGNOSIS — E66813 Obesity, class 3: Secondary | ICD-10-CM

## 2020-10-03 MED ORDER — WEGOVY 0.25 MG/0.5ML ~~LOC~~ SOAJ: 0.2500 mg | SUBCUTANEOUS | 0 refills | Status: DC

## 2020-10-03 MED ORDER — VITAMIN D (ERGOCALCIFEROL) 1.25 MG (50000 UNIT) PO CAPS: 50000.0000 [IU] | ORAL_CAPSULE | ORAL | 0 refills | Status: DC

## 2020-10-03 NOTE — Progress Notes (Signed)
Chief Complaint:   OBESITY Margaret Porter is here to discuss her progress with her obesity treatment plan along with follow-up of her obesity related diagnoses. Margaret Porter is on the Category 3 Plan and states she is following her eating plan approximately small % of the time. Margaret Porter states she is walking 7 times per week.   Today's visit was #: 70 Starting weight: 262 lbs Starting date: 11/12/2018 Today's weight: 265 lbs Today's date: 10/03/2020 Total lbs lost to date: 0 Total lbs lost since last in-office visit: 5  Interim History: Margaret Porter has not started [facility] due to availability. She is skipping breakfast but makes sure to get lunch and dinner in.However, she does deviate from the plan significantly and eats out quite often.  She has not had an office visit since 08/08/2020. She has been drinking quite a bit of soda.   Subjective:   1. Vitamin D deficiency Margaret Porter's last Vit D level was low at 42.5. She is on prescription Vit D.  2. Insulin resistance  She denies polyphagia. She is not on metformin due to a history of meal skipping.  continues to work on diet and exercise to decrease her risk of diabetes.  Lab Results  Component Value Date   INSULIN 35.7 (H) 06/06/2020   INSULIN 30.4 (H) 02/16/2020   INSULIN 18.2 06/29/2019   INSULIN 21.1 11/12/2018   Lab Results  Component Value Date   HGBA1C 5.4 06/06/2020   3. At risk for malnutrition Margaret Porter is at increased risk for malnutrition due to protein deficiency from skipping meals.  Assessment/Plan:   1. Vitamin D deficiency We will refill prescription Vitamin D for 1 month.  - Vitamin D, Ergocalciferol, (DRISDOL) 1.25 MG (50000 UNIT) CAPS capsule; Take 1 capsule (50,000 Units total) by mouth every 7 (seven) days.  Dispense: 4 capsule; Refill: 0  2. Insulin resistance Margaret Porter will continue her meal plan, and she is to not skip meals. She will continue to work on weight loss, exercise, and decreasing simple  carbohydrates to help decrease the risk of diabetes.   3. At risk for malnutrition Margaret Porter was given approximately 15 minutes of counseling today regarding prevention of malnutrition and ways to meet macronutrient goals.  4. Class 3 severe obesity with serious comorbidity and body mass index (BMI) of 45.0 to 49.9 in adult, unspecified obesity type (HCC) Margaret Porter is currently in the action stage of change. As such, her goal is to continue with weight loss efforts. She has agreed to the Category 3 Plan.   Margaret Porter will work on having breakfast daily. She will cut out sugar sweetened beverages.   We discussed various medication options to help Margaret Porter with her weight loss efforts and we both agreed to start [facility], and prescription has been sent to [facility] where it is in stock.  - Semaglutide-Weight Management (WEGOVY) 0.25 MG/0.5ML SOAJ; Inject 0.25 mg into the skin once a week.  Dispense: 2 mL; Refill: 0  Exercise goals: As is.  Behavioral modification strategies: increasing lean protein intake, decreasing liquid calories, decreasing eating out, no skipping meals and meal planning and cooking strategies.  Margaret Porter has agreed to follow-up with our clinic in 3 weeks  Objective:   Blood pressure 120/85, pulse 62, temperature 97.8 F (36.6 C), temperature source Oral, height 5\' 3"  (1.6 m), weight 265 lb (120.2 kg), SpO2 96 %. Body mass index is 46.94 kg/m.  General: Cooperative, alert, well developed, in no acute distress. HEENT: Conjunctivae and lids unremarkable. Cardiovascular: Regular  rhythm.  Lungs: Normal work of breathing. Neurologic: No focal deficits.   Lab Results  Component Value Date   CREATININE 1.10 (H) 07/19/2020   BUN 18 07/19/2020   NA 139 07/19/2020   K 4.3 07/19/2020   CL 104 07/19/2020   CO2 21 07/19/2020   Lab Results  Component Value Date   ALT 15 07/19/2020   AST 17 07/19/2020   ALKPHOS 135 (H) 06/06/2020   BILITOT 0.4 07/19/2020    Lab Results  Component Value Date   HGBA1C 5.4 06/06/2020   HGBA1C 5.4 02/11/2020   HGBA1C 5.0 06/29/2019   HGBA1C 5.3 12/02/2018   HGBA1C 5.4 11/12/2018   Lab Results  Component Value Date   INSULIN 35.7 (H) 06/06/2020   INSULIN 30.4 (H) 02/16/2020   INSULIN 18.2 06/29/2019   INSULIN 21.1 11/12/2018   Lab Results  Component Value Date   TSH 2.760 02/16/2020   Lab Results  Component Value Date   CHOL 213 (H) 06/06/2020   HDL 43 06/06/2020   LDLCALC 141 (H) 06/06/2020   TRIG 160 (H) 06/06/2020   CHOLHDL 4.7 02/11/2020   Lab Results  Component Value Date   WBC 4.4 02/11/2020   HGB 14.6 02/11/2020   HCT 42.9 02/11/2020   MCV 95.1 02/11/2020   PLT 221 02/11/2020   No results found for: IRON, TIBC, FERRITIN  Attestation Statements:   Reviewed by clinician on day of visit: allergies, medications, problem list, medical history, surgical history, family history, social history, and previous encounter notes.   Wilhemena Durie, am acting as Location manager for [employer], FNP-C.  I have reviewed the above documentation for accuracy and completeness, and I agree with the above. -  Georgianne Fick, FNP

## 2020-10-04 ENCOUNTER — Encounter (INDEPENDENT_AMBULATORY_CARE_PROVIDER_SITE_OTHER): Payer: Self-pay | Admitting: Family Medicine

## 2020-10-05 LAB — HM PAP SMEAR: HM Pap smear: NORMAL

## 2020-10-05 MED ORDER — OZEMPIC (0.25 OR 0.5 MG/DOSE) 2 MG/1.5ML ~~LOC~~ SOPN: 0.2500 mg | PEN_INJECTOR | SUBCUTANEOUS | 0 refills | Status: DC

## 2020-10-18 ENCOUNTER — Encounter: Payer: BC Managed Care – PPO | Admitting: *Deleted

## 2020-10-24 ENCOUNTER — Other Ambulatory Visit: Payer: Self-pay

## 2020-10-24 ENCOUNTER — Ambulatory Visit (INDEPENDENT_AMBULATORY_CARE_PROVIDER_SITE_OTHER): Payer: BC Managed Care – PPO | Admitting: Family Medicine

## 2020-10-24 ENCOUNTER — Encounter (INDEPENDENT_AMBULATORY_CARE_PROVIDER_SITE_OTHER): Payer: Self-pay | Admitting: Family Medicine

## 2020-10-24 VITALS — BP 111/77 | HR 97 | Temp 98.4°F | Ht 63.0 in | Wt 267.0 lb

## 2020-10-24 DIAGNOSIS — Z9189 Other specified personal risk factors, not elsewhere classified: Secondary | ICD-10-CM | POA: Diagnosis not present

## 2020-10-24 DIAGNOSIS — E559 Vitamin D deficiency, unspecified: Secondary | ICD-10-CM | POA: Diagnosis not present

## 2020-10-24 DIAGNOSIS — E88819 Insulin resistance, unspecified: Secondary | ICD-10-CM

## 2020-10-24 DIAGNOSIS — Z6841 Body Mass Index (BMI) 40.0 and over, adult: Secondary | ICD-10-CM | POA: Diagnosis not present

## 2020-10-24 DIAGNOSIS — E8881 Metabolic syndrome: Secondary | ICD-10-CM

## 2020-10-24 MED ORDER — VITAMIN D (ERGOCALCIFEROL) 1.25 MG (50000 UNIT) PO CAPS: 50000.0000 [IU] | ORAL_CAPSULE | ORAL | 0 refills | Status: DC

## 2020-10-25 ENCOUNTER — Encounter (INDEPENDENT_AMBULATORY_CARE_PROVIDER_SITE_OTHER): Payer: Self-pay | Admitting: Family Medicine

## 2020-10-25 NOTE — Progress Notes (Signed)
Chief Complaint:   [name] is here to discuss her progress with her obesity treatment plan along with follow-up of her obesity related diagnoses. Margaret Porter is on the Category 3 Plan and states she is following her eating plan approximately 75% of the time. Margaret Porter states she is walking 7,500 steps 7 times per week.  Today's visit was #: 58 Starting weight: 262 lbs Starting date: 11/12/2018 Today's weight: 267 lbs Today's date: 10/24/2020 Total lbs lost to date: 0 Total lbs lost since last in-office visit: 0  Interim History: Margaret Porter picked up the Ozempic, but has not started it because she has a few questions. [facility] was not covered). She has been eating out more -  sometimes 3 times per day. She is working on decreasing this.  Subjective:   Insulin resistance. Margaret Porter tends to eat out quite a bit. She reports some polyphagia.  Lab Results  Component Value Date   INSULIN 35.7 (H) 06/06/2020   INSULIN 30.4 (H) 02/16/2020   INSULIN 18.2 06/29/2019   INSULIN 21.1 11/12/2018   Lab Results  Component Value Date   HGBA1C 5.4 06/06/2020   Vitamin D deficiency. Last Vitamin D level was low at  42.5. Margaret Porter is on weekly prescription Vitamin D supplementation.    Ref. Range 06/06/2020 12:40  Vitamin D, 25-Hydroxy Latest Ref Range: 30.0 - 100.0 ng/mL 42.5   At risk for side effect of medication. Margaret Porter is at risk of side effects from starting Ozempic.  Assessment/Plan:   Insulin resistance. I answered her questions about Ozempic and  she will start Ozempic 0.25 mg as directed.   Vitamin D deficiency.  She was given a refill on her Vitamin D, Ergocalciferol, (DRISDOL) 1.25 MG (50000 UNIT) CAPS capsule every week #12 with 0 refills.  At risk for side effect of medication. Margaret Porter was given approximately 15 minutes of drug side effect counseling today.  We discussed side effect possibility and risk versus benefits. Margaret Porter agreed to the medication and  will contact this office if these side effects are intolerable.  Repetitive spaced learning was employed today to elicit superior memory formation and behavioral change.  Class 3 severe obesity with serious comorbidity and body mass index (BMI) of 45.0 to 49.9 in adult, unspecified obesity type ([place]).  Margaret Porter is currently in the action stage of change. As such, her goal is to continue with weight loss efforts. She has agreed to the Category 3 Plan. She may have a protein shake for breakfast. We discussed breakfast options with protein.  Exercise goals: Margaret Porter will continue her current exercise regimen.   Behavioral modification strategies: decreasing eating out and meal planning and cooking strategies.  Margaret Porter has agreed to follow-up with our clinic in 2-3 weeks.   Objective:   Blood pressure 111/77, pulse 97, temperature 98.4 F (36.9 C), height 5\' 3"  (1.6 m), weight 267 lb (121.1 kg), SpO2 97 %. Body mass index is 47.3 kg/m.  General: Cooperative, alert, well developed, in no acute distress. HEENT: Conjunctivae and lids unremarkable. Cardiovascular: Regular rhythm.  Lungs: Normal work of breathing. Neurologic: No focal deficits.   Lab Results  Component Value Date   CREATININE 1.10 (H) 07/19/2020   BUN 18 07/19/2020   NA 139 07/19/2020   K 4.3 07/19/2020   CL 104 07/19/2020   CO2 21 07/19/2020   Lab Results  Component Value Date   ALT 15 07/19/2020   AST 17 07/19/2020   ALKPHOS 135 (H) 06/06/2020  BILITOT 0.4 07/19/2020   Lab Results  Component Value Date   HGBA1C 5.4 06/06/2020   HGBA1C 5.4 02/11/2020   HGBA1C 5.0 06/29/2019   HGBA1C 5.3 12/02/2018   HGBA1C 5.4 11/12/2018   Lab Results  Component Value Date   INSULIN 35.7 (H) 06/06/2020   INSULIN 30.4 (H) 02/16/2020   INSULIN 18.2 06/29/2019   INSULIN 21.1 11/12/2018   Lab Results  Component Value Date   TSH 2.760 02/16/2020   Lab Results  Component Value Date   CHOL 213 (H) 06/06/2020   HDL  43 06/06/2020   LDLCALC 141 (H) 06/06/2020   TRIG 160 (H) 06/06/2020   CHOLHDL 4.7 02/11/2020   Lab Results  Component Value Date   WBC 4.4 02/11/2020   HGB 14.6 02/11/2020   HCT 42.9 02/11/2020   MCV 95.1 02/11/2020   PLT 221 02/11/2020   No results found for: IRON, TIBC, FERRITIN  Attestation Statements:   Reviewed by clinician on day of visit: allergies, medications, problem list, medical history, surgical history, family history, social history, and previous encounter notes.  IMichaelene Song, am acting as Location manager for [employer], FNP-C   I have reviewed the above documentation for accuracy and completeness, and I agree with the above. -  Georgianne Fick, FNP

## 2020-10-27 ENCOUNTER — Other Ambulatory Visit: Payer: Self-pay

## 2020-10-27 ENCOUNTER — Encounter: Payer: Self-pay | Admitting: Internal Medicine

## 2020-10-27 ENCOUNTER — Encounter (INDEPENDENT_AMBULATORY_CARE_PROVIDER_SITE_OTHER): Payer: Self-pay | Admitting: *Deleted

## 2020-10-27 VITALS — BP 124/79 | HR 65 | Temp 98.3°F | Ht 63.0 in | Wt 273.5 lb

## 2020-10-27 DIAGNOSIS — Z006 Encounter for examination for normal comparison and control in clinical research program: Secondary | ICD-10-CM

## 2020-10-27 NOTE — Research (Signed)
Margaret Porter was here for her final visit for The HAILO Study: A Long Term follow-up of Older HIV-Infected Adults in the ACTG, an observational study addressing the issues of aging, HIV infection and Inflammation  She has started on semaglutide recently for insulin resistance.  She denies any other new diagnoses, but is complaining of having bad cramping pains in her abd/side occasionally. She needs to get her flushot and covid booster and plans to get that when she sees Dr. Megan Salon in a few weeks. She also looks like she never developed an antibody to Hep B vaccination. She will continue on the other observational study, [facility] twice yearly and will be returning for that in December.

## 2020-10-28 LAB — PROTEIN / CREATININE RATIO, URINE
Creatinine, Urine: 106 mg/dL (ref 20–275)
Protein/Creat Ratio: 113 mg/g creat (ref 21–161)
Protein/Creatinine Ratio: 0.113 mg/mg creat (ref 0.021–0.16)
Total Protein, Urine: 12 mg/dL (ref 5–24)

## 2020-10-28 LAB — LIPID PANEL
Cholesterol: 164 mg/dL (ref <200)
HDL: 44 mg/dL — ABNORMAL LOW (ref >=50)
LDL Cholesterol (Calc): 95 mg/dL (calc)
Non-HDL Cholesterol (Calc): 120 mg/dL (calc) (ref <130)
Total CHOL/HDL Ratio: 3.7 (calc) (ref <5.0)
Triglycerides: 149 mg/dL (ref <150)

## 2020-10-28 LAB — COMPREHENSIVE METABOLIC PANEL
AG Ratio: 1.9 (calc) (ref 1.0–2.5)
ALT: 14 U/L (ref 6–29)
AST: 16 U/L (ref 10–35)
Albumin: 4 g/dL (ref 3.6–5.1)
Alkaline phosphatase (APISO): 102 U/L (ref 37–153)
BUN: 12 mg/dL (ref 7–25)
CO2: 24 mmol/L (ref 20–32)
Calcium: 9 mg/dL (ref 8.6–10.4)
Chloride: 105 mmol/L (ref 98–110)
Creat: 0.92 mg/dL (ref 0.50–1.05)
Globulin: 2.1 g/dL (calc) (ref 1.9–3.7)
Glucose, Bld: 97 mg/dL (ref 65–99)
Potassium: 4.3 mmol/L (ref 3.5–5.3)
Sodium: 137 mmol/L (ref 135–146)
Total Bilirubin: 0.3 mg/dL (ref 0.2–1.2)
Total Protein: 6.1 g/dL (ref 6.1–8.1)

## 2020-10-28 LAB — HEPATITIS B SURFACE ANTIGEN: Hepatitis B Surface Ag: NONREACTIVE

## 2020-10-28 LAB — HEPATITIS C ANTIBODY
Hepatitis C Ab: NONREACTIVE
SIGNAL TO CUT-OFF: 0.01 (ref <1.00)

## 2020-10-28 LAB — HEPATITIS B SURFACE ANTIBODY,QUALITATIVE: Hep B S Ab: NONREACTIVE

## 2020-11-01 ENCOUNTER — Other Ambulatory Visit: Payer: BC Managed Care – PPO

## 2020-11-10 ENCOUNTER — Other Ambulatory Visit: Payer: Self-pay

## 2020-11-10 ENCOUNTER — Ambulatory Visit (INDEPENDENT_AMBULATORY_CARE_PROVIDER_SITE_OTHER): Payer: BC Managed Care – PPO | Admitting: Family Medicine

## 2020-11-10 ENCOUNTER — Encounter (INDEPENDENT_AMBULATORY_CARE_PROVIDER_SITE_OTHER): Payer: Self-pay | Admitting: Family Medicine

## 2020-11-10 VITALS — BP 122/79 | HR 71 | Temp 98.0°F | Ht 63.0 in | Wt 269.0 lb

## 2020-11-10 DIAGNOSIS — E8881 Metabolic syndrome: Secondary | ICD-10-CM

## 2020-11-10 DIAGNOSIS — Z6841 Body Mass Index (BMI) 40.0 and over, adult: Secondary | ICD-10-CM | POA: Diagnosis not present

## 2020-11-10 DIAGNOSIS — Z9189 Other specified personal risk factors, not elsewhere classified: Secondary | ICD-10-CM | POA: Diagnosis not present

## 2020-11-10 DIAGNOSIS — E88819 Insulin resistance, unspecified: Secondary | ICD-10-CM

## 2020-11-10 DIAGNOSIS — R1032 Left lower quadrant pain: Secondary | ICD-10-CM

## 2020-11-10 MED ORDER — OZEMPIC (0.25 OR 0.5 MG/DOSE) 2 MG/1.5ML ~~LOC~~ SOPN: 0.5000 mg | PEN_INJECTOR | SUBCUTANEOUS | 0 refills | Status: DC

## 2020-11-10 NOTE — Progress Notes (Signed)
Chief Complaint:   OBESITY Margaret Porter is here to discuss her progress with her obesity treatment plan along with follow-up of her obesity related diagnoses. Margaret Porter is on the Category 3 Plan and states she is following her eating plan approximately 75% of the time. Day[place] states she is walking for 60 minutes 7 times per week.  Today's visit was #: 93 Starting weight: 262 lbs Starting date: 11/12/2018 Today's weight: 269 lbs Today's date: 11/10/2020 Total lbs lost to date: 0 Total lbs lost since last in-office visit: 0  Interim History: [name] started Ozempic and she notes a little bit less appetite. She denies constipation or nausea. However, she is up 2 lbs today. She is eating out most meals. 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  Subjective:   1. Insulin resistance Ashelynn notes her appetite has decreased with the start of Ozempic. She notes increased satiety.   Lab Results  Component Value Date   INSULIN 35.7 (H) 06/06/2020   INSULIN 30.4 (H) 02/16/2020   INSULIN 18.2 06/29/2019   INSULIN 21.1 11/12/2018   Lab Results  Component Value Date   HGBA1C 5.4 06/06/2020   2. LLQ abdominal pain Margaret Porter notes sharp, sporadic lower left quadrant pain that lasts 15-20 minutes. Pain is not associated with movement.  Pain occurs only every few months. She notes fever, nausea, and vomiting. She was diagnosed with diverticulosis a few years ago.   3. At risk for side effect of medication Margaret Porter is at risk for drug side effects due to increased dose of Ozempic.  Assessment/Plan:   1. Insulin resistance Margaret Porter will continue to work on weight loss, exercise, and decreasing simple carbohydrates to help decrease the risk of diabetes. We will refill Ozempic for 1 month. Margaret Porter agreed to follow-up with [name] as directed to closely monitor her progress.  - Semaglutide,0.25 or 0.5MG /DOS, (OZEMPIC, 0.25 OR 0.5 MG/DOSE,) 2 MG/1.5ML SOPN; Inject 0.5 mg  into the skin once a week.  Dispense: 1.5 mL; Refill: 0  2. LLQ abdominal pain Margaret Porter will follow up with her primary care provider if her pain continues. Etiology possibly diverticulitis.   3. At risk for side effect of medication Margaret Porter was given approximately 15 minutes of drug side effect counseling today.  We discussed side effect possibility and risk versus benefits. Kasandra agreed to the medication and will contact this office if these side effects are intolerable.  Repetitive spaced learning was employed today to elicit superior memory formation and behavioral change.  4. Class 3 severe obesity with serious comorbidity and body mass index (BMI) of 45.0 to 49.9 in adult, unspecified obesity type (HCC) Margaret Porter is currently in the action stage of change. As such, her goal is to continue with weight loss efforts. She has agreed to practicing portion control and making smarter food choices, such as increasing vegetables and decreasing simple carbohydrates.   Handout was giving today: Thanksgiving Tips.  Exercise goals: As is.  Behavioral modification strategies: increasing lean protein intake, decreasing simple carbohydrates, decreasing eating out and meal planning and cooking strategies.  Suzan has agreed to follow-up with our clinic in 3 weeks.  Objective:   Blood pressure 122/79, pulse 71, temperature 98 F (36.7 C), temperature source Oral, height 5\' 3"  (1.6 m), weight 269 lb (122 kg), SpO2 94 %. Body mass index is 47.65 kg/m.  General: Cooperative, alert, well developed, in no acute distress. HEENT: Conjunctivae and lids unremarkable. Cardiovascular: Regular rhythm.  Lungs: Normal  work of breathing. Neurologic: No focal deficits.   Lab Results  Component Value Date   CREATININE 0.92 10/27/2020   BUN 12 10/27/2020   NA 137 10/27/2020   K 4.3 10/27/2020   CL 105 10/27/2020   CO2 24 10/27/2020   Lab Results  Component Value Date   ALT 14 10/27/2020   AST 16  10/27/2020   ALKPHOS 135 (H) 06/06/2020   BILITOT 0.3 10/27/2020   Lab Results  Component Value Date   HGBA1C 5.4 06/06/2020   HGBA1C 5.4 02/11/2020   HGBA1C 5.0 06/29/2019   HGBA1C 5.3 12/02/2018   HGBA1C 5.4 11/12/2018   Lab Results  Component Value Date   INSULIN 35.7 (H) 06/06/2020   INSULIN 30.4 (H) 02/16/2020   INSULIN 18.2 06/29/2019   INSULIN 21.1 11/12/2018   Lab Results  Component Value Date   TSH 2.760 02/16/2020   Lab Results  Component Value Date   CHOL 164 10/27/2020   HDL 44 (L) 10/27/2020   LDLCALC 95 10/27/2020   TRIG 149 10/27/2020   CHOLHDL 3.7 10/27/2020   Lab Results  Component Value Date   WBC 4.4 02/11/2020   HGB 14.6 02/11/2020   HCT 42.9 02/11/2020   MCV 95.1 02/11/2020   PLT 221 02/11/2020   No results found for: IRON, TIBC, FERRITIN  Attestation Statements:   Reviewed by clinician on day of visit: allergies, medications, problem list, medical history, surgical history, family history, social history, and previous encounter notes.   Wilhemena Durie, am acting as Location manager for [employer], FNP-C.  I have reviewed the above documentation for accuracy and completeness, and I agree with the above. - Georgianne Fick, FNP

## 2020-11-14 ENCOUNTER — Encounter (INDEPENDENT_AMBULATORY_CARE_PROVIDER_SITE_OTHER): Payer: Self-pay | Admitting: Family Medicine

## 2020-11-15 ENCOUNTER — Ambulatory Visit (INDEPENDENT_AMBULATORY_CARE_PROVIDER_SITE_OTHER): Payer: BC Managed Care – PPO | Admitting: Internal Medicine

## 2020-11-15 ENCOUNTER — Other Ambulatory Visit: Payer: Self-pay

## 2020-11-15 ENCOUNTER — Encounter: Payer: Self-pay | Admitting: Internal Medicine

## 2020-11-15 VITALS — BP 133/85 | HR 109 | Wt 273.0 lb

## 2020-11-15 DIAGNOSIS — B20 Human immunodeficiency virus [HIV] disease: Secondary | ICD-10-CM | POA: Diagnosis not present

## 2020-11-15 DIAGNOSIS — Z23 Encounter for immunization: Secondary | ICD-10-CM | POA: Diagnosis not present

## 2020-11-15 MED ORDER — BICTEGRAVIR-EMTRICITAB-TENOFOV 50-200-25 MG PO TABS: 1.0000 | ORAL_TABLET | Freq: Every day | ORAL | 11 refills | Status: DC

## 2020-11-15 NOTE — Assessment & Plan Note (Signed)
Her infection remains under excellent, long-term control.  I will change [place] to [place] which she can take with or without food.  I encouraged her to take it at roughly the same time each day.  She is scheduled to get her Covid booster.  She will follow-up here after lab work in 1 year.

## 2020-11-15 NOTE — Progress Notes (Signed)
Patient Active Problem List   Diagnosis Date Noted  . Human immunodeficiency virus (HIV) disease ([place]) 01/24/2007    Priority: High  . Abnormal dreams 01/06/2014    Priority: Medium  . Obesity 11/06/2007    Priority: Medium  . Cough 08/30/2020  . Fever 08/30/2020  . Seasonal allergies 05/16/2020  . Estrogen deficiency 01/11/2020  . Perimenopausal 01/11/2020  . Edema 12/17/2019  . Bony sclerosis 02/03/2019  . S/P appendectomy 01/20/2019  . Vaccine counseling 01/20/2019  . Insulin resistance 12/09/2018  . Routine general medical examination at a health care facility 12/22/2015  . Vitamin D deficiency 12/22/2015  . Need for pneumococcal vaccination 12/22/2015  . Alkaline phosphatase elevation 12/22/2015  . Rosacea 04/22/2012  . URINARY INCONTINENCE, URGE 10/24/2010  . Hyperlipidemia 11/06/2007  . URTICARIA, IDIOPATHIC 03/06/2007    Patient's Medications  New Prescriptions   BICTEGRAVIR-EMTRICITABINE-TENOFOVIR AF (BIKTARVY) 50-200-25 MG TABS TABLET    Take 1 tablet by mouth daily.  Previous Medications   CLARITHROMYCIN (BIAXIN) 500 MG TABLET    Take 1 tablet (500 mg total) by mouth 2 (two) times daily.   DIPHENHYDRAMINE (BENADRYL) 25 MG TABLET    Take 25 mg by mouth every 6 (six) hours as needed for itching or allergies. Reported on 02/29/2016   NYSTATIN-TRIAMCINOLONE OINTMENT (MYCOLOG)    APPLY TO AFFECTED AREA 2X A DAY FOR 10 DAYS AS NEEDED   PROMETHAZINE-DEXTROMETHORPHAN (PROMETHAZINE-DM) 6.25-15 MG/5ML SYRUP    Take 5 mLs by mouth 4 (four) times daily as needed for cough.   SEMAGLUTIDE,0.25 OR 0.5MG /DOS, (OZEMPIC, 0.25 OR 0.5 MG/DOSE,) 2 MG/1.5ML SOPN    Inject 0.5 mg into the skin once a week.   VITAMIN D, ERGOCALCIFEROL, (DRISDOL) 1.25 MG (50000 UNIT) CAPS CAPSULE    Take 1 capsule (50,000 Units total) by mouth every 7 (seven) days.  Modified Medications   No medications on file  Discontinued Medications   GENVOYA 150-150-200-10 MG TABS TABLET    TAKE ONE  TABLET BY MOUTH ONCE DAILY WITH BREAKFAST. STORE IN ORIGINALCONTAINER AT ROOM TEMPERATURE.    Subjective: Margaret Porter is in for her routine HIV follow-up visit.  She denies any problems obtaining, taking or tolerating her Genvoya.  The frequently varies the time of day she takes it and does not always take it with food.  She rarely misses doses.  She missed 1 dose in the past month when she fell asleep before taking it.  She is due for her Covid booster.  She is not feeling anxious or depressed.  Review of Systems: Review of Systems  Constitutional: Negative for fever and malaise/fatigue.  Psychiatric/Behavioral: Negative for depression. The patient is not nervous/anxious.     Past Medical History:  Diagnosis Date  . Allergic rhinitis   . Allergy   . Food allergy   . HIV infection ([place]) 2006  . Idiopathic urticaria   . Joint pain   . Lytic bone lesions on xray    bone lesions on hip,sternum and spine  . Obesity   . Pre-diabetes    pt not on meds  . Rosacea   . Sickle cell anemia (HCC)    Sickle cell trait with mom  . Swelling of both lower extremities    per pt, left side is more swollen  . Vitamin D deficiency     Social History   Tobacco Use  . Smoking status: Never Smoker  . Smokeless tobacco: Never Used  Vaping Use  . Vaping  Use: Never used  Substance Use Topics  . Alcohol use: Yes    Alcohol/week: 0.0 standard drinks    Comment: occasional (special occasions)  . Drug use: No    Family History  Problem Relation Age of Onset  . Diabetes Mother   . Diabetes Brother   . Heart disease Paternal Uncle   . Heart disease Maternal Grandmother   . Cancer Maternal Grandmother        breast  . Heart disease Paternal Grandmother   . Cancer Paternal Grandmother        breast  . Diabetes Father   . High blood pressure Father   . Colon cancer Neg Hx   . Colon polyps Neg Hx   . Esophageal cancer Neg Hx   . Rectal cancer Neg Hx   . Stomach cancer Neg Hx     Allergies   Allergen Reactions  . Molds & Smuts     SOB, chest congestion  . Oxycodone     Questionable mouth itching, sweats, but was also simultaneously on Cipro, Flagyl, and Robaxin.  [name] Kitchen Red Dye     Caused a rash with large amounts  . Wellbutrin [Bupropion] Other (See Comments)    Eyes swelled 3 hours after taking one dose  . Amoxicillin Rash  . Aspirin Anxiety  . Sulfamethoxazole-Trimethoprim Rash    Health Maintenance  Topic Date Due  . INFLUENZA VACCINE  07/31/2020  . PAP SMEAR-Modifier  09/28/2020  . MAMMOGRAM  12/06/2021  . TETANUS/TDAP  12/21/2025  . COLONOSCOPY  03/08/2029  . COVID-19 Vaccine  Completed  . Hepatitis C Screening  Completed  . HIV Screening  Completed    Objective:  Vitals:   11/15/20 1529 11/15/20 1531  BP:  133/85  Pulse:  (!) 109  Weight: 273 lb (123.8 kg)    Body mass index is 48.36 kg/m.  Physical Exam  Lab Results Lab Results  Component Value Date   WBC 4.4 02/11/2020   HGB 14.6 02/11/2020   HCT 42.9 02/11/2020   MCV 95.1 02/11/2020   PLT 221 02/11/2020    Lab Results  Component Value Date   CREATININE 0.92 10/27/2020   BUN 12 10/27/2020   NA 137 10/27/2020   K 4.3 10/27/2020   CL 105 10/27/2020   CO2 24 10/27/2020    Lab Results  Component Value Date   ALT 14 10/27/2020   AST 16 10/27/2020   ALKPHOS 135 (H) 06/06/2020   BILITOT 0.3 10/27/2020    Lab Results  Component Value Date   CHOL 164 10/27/2020   HDL 44 (L) 10/27/2020   LDLCALC 95 10/27/2020   TRIG 149 10/27/2020   CHOLHDL 3.7 10/27/2020   Lab Results  Component Value Date   LABRPR NON-REACTIVE 11/09/2019   HIV 1 RNA Quant  Date Value  11/09/2019 <20 NOT DETECTED copies/mL  04/03/2019 <20 NOT DETECTED copies/mL  12/02/2018 not detected copies   HIV-1 RNA Viral Load (no units)  Date Value  08/04/2019 <40  02/16/2019 <40  08/19/2018 <40   CD4 (no units)  Date Value  11/08/2015 550  02/08/2015 443  12/21/2013 475   CD4 T Cell Abs (/uL)  Date Value    11/09/2019 596  04/03/2019 540     Problem List Items Addressed This Visit      High   Human immunodeficiency virus (HIV) disease ([place])    Her infection remains under excellent, long-term control.  I will change Genvoya to [place] which she can  take with or without food.  I encouraged her to take it at roughly the same time each day.  She is scheduled to get her Covid booster.  She will follow-up here after lab work in 1 year.      Relevant Medications   bictegravir-emtricitabine-tenofovir AF (BIKTARVY) 50-200-25 MG TABS tablet   Other Relevant Orders   CBC   T-helper cell (CD4)- ([facility] clinic only)   Comprehensive metabolic panel   Lipid panel   RPR   HIV-1 RNA quant-no reflex-bld        Michel Bickers, MD [facility] [place] [phone] pager   [phone] cell 11/15/2020, 3:59 PM

## 2020-12-01 ENCOUNTER — Encounter (INDEPENDENT_AMBULATORY_CARE_PROVIDER_SITE_OTHER): Payer: Self-pay | Admitting: Family Medicine

## 2020-12-01 ENCOUNTER — Ambulatory Visit (INDEPENDENT_AMBULATORY_CARE_PROVIDER_SITE_OTHER): Payer: BC Managed Care – PPO | Admitting: Family Medicine

## 2020-12-01 ENCOUNTER — Other Ambulatory Visit: Payer: Self-pay

## 2020-12-01 VITALS — BP 110/75 | HR 71 | Temp 98.2°F | Ht 63.0 in | Wt 264.0 lb

## 2020-12-01 DIAGNOSIS — E8881 Metabolic syndrome: Secondary | ICD-10-CM

## 2020-12-01 DIAGNOSIS — Z6841 Body Mass Index (BMI) 40.0 and over, adult: Secondary | ICD-10-CM | POA: Diagnosis not present

## 2020-12-01 DIAGNOSIS — R1032 Left lower quadrant pain: Secondary | ICD-10-CM

## 2020-12-01 NOTE — Progress Notes (Signed)
Chief Complaint:   OBESITY Margaret Porter is here to discuss her progress with her obesity treatment plan along with follow-up of her obesity related diagnoses. Margaret Porter is on practicing portion control and making smarter food choices, such as increasing vegetables and decreasing simple carbohydrates and states she is following her eating plan approximately 75% of the time. [name] states she is walking 7,500 steps per day 8 days per week.  Today's visit was #: 63 Starting weight: 262 lbs Starting date: 11/12/2018 Today's weight: 264 lbs Today's date: 12/01/2020 Total lbs lost to date: 0 Total lbs lost since last in-office visit: 5 lbs  Interim History: Margaret Porter is down 5 pounds today, but she is unsure how she lost weight.  Meal skipping has decreased.  She report she has cut back on soda and sweet tea.  She Is very active with coaching at school.  Subjective:   1. Insulin resistance Margaret Porter is unsure if Ozempic is helping with appetite.  She feels she is snacking more rather than eating meals.  On Ozempic 0.5 mg weekly.  Denies nausea or constipation.  Lab Results  Component Value Date   INSULIN 35.7 (H) 06/06/2020   INSULIN 30.4 (H) 02/16/2020   INSULIN 18.2 06/29/2019   INSULIN 21.1 11/12/2018   Lab Results  Component Value Date   HGBA1C 5.4 06/06/2020   2. LLQ abdominal pain LLQ pain improved.  She has not had any pain in 4 weeks.  Assessment/Plan:   1. Insulin resistance Continue Ozempic.  2. LLQ abdominal pain See PCP if pain returns.  3. Class 3 severe obesity with serious comorbidity and body mass index (BMI) of 45.0 to 49.9 in adult, unspecified obesity type (HCC)  Margaret Porter is currently in the action stage of change. As such, her goal is to continue with weight loss efforts. She has agreed to practicing portion control and making smarter food choices, such as increasing vegetables and decreasing simple carbohydrates.   Exercise goals: As is.  Behavioral  modification strategies: decreasing liquid calories, decreasing eating out and no skipping meals.  [name] has agreed to follow-up with our clinic in 3 weeks.   Objective:   Blood pressure 110/75, pulse 71, temperature 98.2 F (36.8 C), height 5\' 3"  (1.6 m), weight 264 lb (119.7 kg), last menstrual period 11/24/2020, SpO2 97 %. Body mass index is 46.77 kg/m.  General: Cooperative, alert, well developed, in no acute distress. HEENT: Conjunctivae and lids unremarkable. Cardiovascular: Regular rhythm.  Lungs: Normal work of breathing. Neurologic: No focal deficits.   Lab Results  Component Value Date   CREATININE 0.92 10/27/2020   BUN 12 10/27/2020   NA 137 10/27/2020   K 4.3 10/27/2020   CL 105 10/27/2020   CO2 24 10/27/2020   Lab Results  Component Value Date   ALT 14 10/27/2020   AST 16 10/27/2020   ALKPHOS 135 (H) 06/06/2020   BILITOT 0.3 10/27/2020   Lab Results  Component Value Date   HGBA1C 5.4 06/06/2020   HGBA1C 5.4 02/11/2020   HGBA1C 5.0 06/29/2019   HGBA1C 5.3 12/02/2018   HGBA1C 5.4 11/12/2018   Lab Results  Component Value Date   INSULIN 35.7 (H) 06/06/2020   INSULIN 30.4 (H) 02/16/2020   INSULIN 18.2 06/29/2019   INSULIN 21.1 11/12/2018   Lab Results  Component Value Date   TSH 2.760 02/16/2020   Lab Results  Component Value Date   CHOL 164 10/27/2020   HDL 44 (L) 10/27/2020   [place] 95 10/27/2020  TRIG 149 10/27/2020   CHOLHDL 3.7 10/27/2020   Lab Results  Component Value Date   WBC 4.4 02/11/2020   HGB 14.6 02/11/2020   HCT 42.9 02/11/2020   MCV 95.1 02/11/2020   PLT 221 02/11/2020   Attestation Statements:   Reviewed by clinician on day of visit: allergies, medications, problem list, medical history, surgical history, family history, social history, and previous encounter notes.  I, Water quality scientist, CMA, am acting as Location manager for [employer], [place].  I have reviewed the above documentation for accuracy and completeness,  and I agree with the above. -  Georgianne Fick, FNP

## 2020-12-02 ENCOUNTER — Ambulatory Visit: Payer: BC Managed Care – PPO

## 2020-12-09 ENCOUNTER — Ambulatory Visit (INDEPENDENT_AMBULATORY_CARE_PROVIDER_SITE_OTHER): Payer: BC Managed Care – PPO

## 2020-12-09 ENCOUNTER — Other Ambulatory Visit: Payer: Self-pay

## 2020-12-09 DIAGNOSIS — Z23 Encounter for immunization: Secondary | ICD-10-CM | POA: Diagnosis not present

## 2020-12-09 NOTE — Progress Notes (Signed)
   Covid-19 Vaccination Clinic  Name:  Margaret Porter    MRN: [id number] DOB: [birth year]  12/09/2020  [name] was observed post Covid-19 immunization for 15 minutes without incident. She was provided with Vaccine Information Sheet and instruction to access the V-Safe system.   [name] was instructed to call 911 with any severe reactions post vaccine: [name] Kitchen Difficulty breathing  . Swelling of face and throat  . A fast heartbeat  . A bad rash all over body  . Dizziness and weakness   Immunizations Administered    Name Date Dose VIS Date Route   Pfizer COVID-19 Vaccine 12/09/2020 11:25 AM 0.3 mL 10/19/2020 Intramuscular   Manufacturer: [place]   Lot: [id number]   NDC: [address]-4

## 2020-12-15 ENCOUNTER — Encounter: Payer: Self-pay | Admitting: Internal Medicine

## 2020-12-15 LAB — HM MAMMOGRAPHY

## 2020-12-20 ENCOUNTER — Encounter (INDEPENDENT_AMBULATORY_CARE_PROVIDER_SITE_OTHER): Payer: Self-pay | Admitting: Family Medicine

## 2020-12-20 ENCOUNTER — Encounter (INDEPENDENT_AMBULATORY_CARE_PROVIDER_SITE_OTHER): Payer: Self-pay | Admitting: *Deleted

## 2020-12-20 ENCOUNTER — Other Ambulatory Visit: Payer: Self-pay

## 2020-12-20 VITALS — BP 120/85 | HR 66 | Temp 98.5°F | Wt 263.1 lb

## 2020-12-20 DIAGNOSIS — Z006 Encounter for examination for normal comparison and control in clinical research program: Secondary | ICD-10-CM

## 2020-12-20 LAB — COMPREHENSIVE METABOLIC PANEL
AG Ratio: 2.2 (calc) (ref 1.0–2.5)
ALT: 44 U/L — ABNORMAL HIGH (ref 6–29)
AST: 36 U/L — ABNORMAL HIGH (ref 10–35)
Albumin: 4 g/dL (ref 3.6–5.1)
Alkaline phosphatase (APISO): 108 U/L (ref 37–153)
BUN: 12 mg/dL (ref 7–25)
CO2: 21 mmol/L (ref 20–32)
Calcium: 8.7 mg/dL (ref 8.6–10.4)
Chloride: 107 mmol/L (ref 98–110)
Creat: 0.91 mg/dL (ref 0.50–1.05)
Globulin: 1.8 g/dL (calc) — ABNORMAL LOW (ref 1.9–3.7)
Glucose, Bld: 88 mg/dL (ref 65–99)
Potassium: 3.8 mmol/L (ref 3.5–5.3)
Sodium: 139 mmol/L (ref 135–146)
Total Bilirubin: 0.4 mg/dL (ref 0.2–1.2)
Total Protein: 5.8 g/dL — ABNORMAL LOW (ref 6.1–8.1)

## 2020-12-20 NOTE — Telephone Encounter (Signed)
fyi

## 2020-12-20 NOTE — Research (Signed)
Margaret Porter was here for her week 72 visit for [facility] Study: Decay of HIV-1 [place]erv[place]s in Subjects on long-term ARVs, an observational study  She has been having GI upset since Saturday and thinks she may have gotten food poisoning, She says it is getting better now. She recently switched to [facility] and thought that may have triggerred the upset, but sounds too severe for that.   She also started ozempic back in October. Her next study visit will be in June.

## 2021-01-05 ENCOUNTER — Ambulatory Visit (INDEPENDENT_AMBULATORY_CARE_PROVIDER_SITE_OTHER): Payer: BC Managed Care – PPO | Admitting: Family Medicine

## 2021-01-05 ENCOUNTER — Encounter (INDEPENDENT_AMBULATORY_CARE_PROVIDER_SITE_OTHER): Payer: Self-pay | Admitting: Family Medicine

## 2021-01-05 ENCOUNTER — Other Ambulatory Visit: Payer: Self-pay

## 2021-01-05 VITALS — BP 123/82 | HR 82 | Temp 97.8°F | Ht 63.0 in | Wt 262.0 lb

## 2021-01-05 DIAGNOSIS — E559 Vitamin D deficiency, unspecified: Secondary | ICD-10-CM | POA: Diagnosis not present

## 2021-01-05 DIAGNOSIS — E8881 Metabolic syndrome: Secondary | ICD-10-CM | POA: Diagnosis not present

## 2021-01-05 DIAGNOSIS — Z6841 Body Mass Index (BMI) 40.0 and over, adult: Secondary | ICD-10-CM

## 2021-01-05 DIAGNOSIS — Z9189 Other specified personal risk factors, not elsewhere classified: Secondary | ICD-10-CM

## 2021-01-05 DIAGNOSIS — E88819 Insulin resistance, unspecified: Secondary | ICD-10-CM

## 2021-01-05 MED ORDER — OZEMPIC (0.25 OR 0.5 MG/DOSE) 2 MG/1.5ML ~~LOC~~ SOPN: 0.5000 mg | PEN_INJECTOR | SUBCUTANEOUS | 0 refills | Status: DC

## 2021-01-10 NOTE — Progress Notes (Signed)
Chief Complaint:   OBESITY Margaret Porter is here to discuss her progress with her obesity treatment plan along with follow-up of her obesity related diagnoses. Margaret Porter is on practicing portion control and making smarter food choices, such as increasing vegetables and decreasing simple carbohydrates and states she is following her eating plan approximately 50% of the time. Margaret Porter states she is walking  60 minutes 5 times per week.  Today's visit was #: 73 Starting weight: 262 lbs Starting date: 11/12/2018 Today's weight: 262 lbs Today's date: 01/05/2021 Total lbs lost to date: 0 Total lbs lost since last in-office visit: 2 lbs  Interim History: Margaret Porter is eating 2 meals per day. She skips breakfast most days. Margaret Porter is eating out the other 2 meals. She had been packing her lunch before Christmas rathert han eating out. Margaret Porter has recovered from the GI symptoms she had in December.  Subjective:   1. Insulin resistance Margaret Porter has a diagnosis of insulin resistance based on her elevated fasting insulin level >5.  Margaret Porter notes  hunger is satisfied with Ozempic. She denies constipation or nausea.  Lab Results  Component Value Date   INSULIN 35.7 (H) 06/06/2020   INSULIN 30.4 (H) 02/16/2020   INSULIN 18.2 06/29/2019   INSULIN 21.1 11/12/2018   Lab Results  Component Value Date   HGBA1C 5.4 06/06/2020    2. Vitamin D deficiency Margaret Porter's Vitamin D level is almost at goal at 42.5. She is on weekly Vitamin D prescription.   3. At risk for impaired metabolic function Margaret Porter is at increased risk for impaired metabolic function due to decreased inadequate protein, no resistance training, and meal skipping.   Assessment/Plan:   1. Insulin resistance We will refill Ozempic 0.5 mg weekly. No refill. - Semaglutide,0.25 or 0.5MG /DOS, (OZEMPIC, 0.25 OR 0.5 MG/DOSE,) 2 MG/1.5ML SOPN; Inject 0.5 mg into the skin once a week.  Dispense: 1.5 mL; Refill: 0  2. Vitamin D  deficiency  She agrees to continue to take prescription Vitamin D @50 ,000 IU every week and will follow-up for routine testing of Vitamin D, at least 2-3 times per year to avoid over-replacement. Margaret Porter will continue weekly Vitamin D prescription.  3. At risk for impaired metabolic function Margaret Porter was given approximately 15 minutes of drug side effect counseling today.  We discussed side effect possibility and risk versus benefits. Margaret Porter agreed to the medication and will contact this office if these side effects are intolerable.  Repetitive spaced learning was employed today to elicit superior memory formation and behavioral change.  4. Class 3 severe obesity with serious comorbidity and body mass index (BMI) of 45.0 to 49.9 in adult, unspecified obesity type (HCC)  Margaret Porter is currently in the action stage of change. As such, her goal is to continue with weight loss efforts. She has agreed to practicing portion control and making smarter food choices, such as increasing vegetables and decreasing simple carbohydrates.   Exercise goals: Margaret Porter will consider adding weight training.  Behavioral modification strategies: increasing lean protein intake and no skipping meals.  Margaret Porter has agreed to follow-up with our clinic in 3 weeks.    Objective:   Blood pressure 123/82, pulse 82, temperature 97.8 F (36.6 C), height 5\' 3"  (1.6 m), weight 262 lb (118.8 kg), SpO2 99 %. Body mass index is 46.41 kg/m.  General: Cooperative, alert, well developed, in no acute distress. HEENT: Conjunctivae and lids unremarkable. Cardiovascular: Regular rhythm.  Lungs: Normal work of breathing. Neurologic: No focal deficits.   Lab Results  Component Value Date   CREATININE 0.91 12/20/2020   BUN 12 12/20/2020   NA 139 12/20/2020   K 3.8 12/20/2020   CL 107 12/20/2020   CO2 21 12/20/2020   Lab Results  Component Value Date   ALT 44 (H) 12/20/2020   AST 36 (H) 12/20/2020   ALKPHOS 135 (H)  06/06/2020   BILITOT 0.4 12/20/2020   Lab Results  Component Value Date   HGBA1C 5.4 06/06/2020   HGBA1C 5.4 02/11/2020   HGBA1C 5.0 06/29/2019   HGBA1C 5.3 12/02/2018   HGBA1C 5.4 11/12/2018   Lab Results  Component Value Date   INSULIN 35.7 (H) 06/06/2020   INSULIN 30.4 (H) 02/16/2020   INSULIN 18.2 06/29/2019   INSULIN 21.1 11/12/2018   Lab Results  Component Value Date   TSH 2.760 02/16/2020   Lab Results  Component Value Date   CHOL 164 10/27/2020   HDL 44 (L) 10/27/2020   LDLCALC 95 10/27/2020   TRIG 149 10/27/2020   CHOLHDL 3.7 10/27/2020   Lab Results  Component Value Date   WBC 4.4 02/11/2020   HGB 14.6 02/11/2020   HCT 42.9 02/11/2020   MCV 95.1 02/11/2020   PLT 221 02/11/2020   No results found for: IRON, TIBC, FERRITIN   Attestation Statements:   Reviewed by clinician on day of visit: allergies, medications, problem list, medical history, surgical history, family history, social history, and previous encounter notes.     IPara March, am acting as Location manager for [employer], [place].  I have reviewed the above documentation for accuracy and completeness, and I agree with the above. -  Georgianne Fick, FNP

## 2021-01-11 ENCOUNTER — Encounter (INDEPENDENT_AMBULATORY_CARE_PROVIDER_SITE_OTHER): Payer: Self-pay | Admitting: Family Medicine

## 2021-01-12 LAB — HIV RNA, QUANTITATIVE, PCR
HIV 1 RNA Quant: <40
Log copies/ml (ULTRA): <1.6

## 2021-01-13 ENCOUNTER — Ambulatory Visit: Payer: BC Managed Care – PPO | Admitting: Medical

## 2021-01-13 ENCOUNTER — Other Ambulatory Visit: Payer: Self-pay

## 2021-01-13 ENCOUNTER — Encounter: Payer: Self-pay | Admitting: Medical

## 2021-01-13 VITALS — BP 122/80 | HR 81 | Ht 63.0 in | Wt 266.6 lb

## 2021-01-13 DIAGNOSIS — N3941 Urge incontinence: Secondary | ICD-10-CM

## 2021-01-13 DIAGNOSIS — Z9049 Acquired absence of other specified parts of digestive tract: Secondary | ICD-10-CM

## 2021-01-13 DIAGNOSIS — E8881 Metabolic syndrome: Secondary | ICD-10-CM

## 2021-01-13 DIAGNOSIS — E2839 Other primary ovarian failure: Secondary | ICD-10-CM

## 2021-01-13 DIAGNOSIS — I872 Venous insufficiency (chronic) (peripheral): Secondary | ICD-10-CM

## 2021-01-13 DIAGNOSIS — J302 Other seasonal allergic rhinitis: Secondary | ICD-10-CM

## 2021-01-13 DIAGNOSIS — L501 Idiopathic urticaria: Secondary | ICD-10-CM

## 2021-01-13 DIAGNOSIS — L719 Rosacea, unspecified: Secondary | ICD-10-CM

## 2021-01-13 DIAGNOSIS — R609 Edema, unspecified: Secondary | ICD-10-CM

## 2021-01-13 DIAGNOSIS — Q782 Osteopetrosis: Secondary | ICD-10-CM

## 2021-01-13 DIAGNOSIS — E559 Vitamin D deficiency, unspecified: Secondary | ICD-10-CM | POA: Diagnosis not present

## 2021-01-13 DIAGNOSIS — Z7185 Encounter for immunization safety counseling: Secondary | ICD-10-CM

## 2021-01-13 DIAGNOSIS — R8290 Unspecified abnormal findings in urine: Secondary | ICD-10-CM

## 2021-01-13 DIAGNOSIS — N951 Menopausal and female climacteric states: Secondary | ICD-10-CM

## 2021-01-13 DIAGNOSIS — E7800 Pure hypercholesterolemia, unspecified: Secondary | ICD-10-CM

## 2021-01-13 DIAGNOSIS — Z0000 Encounter for general adult medical examination without abnormal findings: Secondary | ICD-10-CM | POA: Diagnosis not present

## 2021-01-13 DIAGNOSIS — Z6841 Body Mass Index (BMI) 40.0 and over, adult: Secondary | ICD-10-CM

## 2021-01-13 DIAGNOSIS — E66813 Obesity, class 3: Secondary | ICD-10-CM

## 2021-01-13 DIAGNOSIS — G8929 Other chronic pain: Secondary | ICD-10-CM

## 2021-01-13 DIAGNOSIS — B20 Human immunodeficiency virus [HIV] disease: Secondary | ICD-10-CM

## 2021-01-13 DIAGNOSIS — R7989 Other specified abnormal findings of blood chemistry: Secondary | ICD-10-CM

## 2021-01-13 DIAGNOSIS — R109 Unspecified abdominal pain: Secondary | ICD-10-CM

## 2021-01-13 DIAGNOSIS — E88819 Insulin resistance, unspecified: Secondary | ICD-10-CM

## 2021-01-13 LAB — POCT URINALYSIS DIP (PROADVANTAGE DEVICE)
Bilirubin, UA: NEGATIVE
Blood, UA: NEGATIVE
Glucose, UA: NEGATIVE mg/dL
Ketones, POC UA: NEGATIVE mg/dL
Leukocytes, UA: NEGATIVE
Nitrite, UA: NEGATIVE
Protein Ur, POC: NEGATIVE mg/dL
Specific Gravity, Urine: 1.015
Urobilinogen, Ur: 0.2
pH, UA: 6 (ref 5.0–8.0)

## 2021-01-13 NOTE — Progress Notes (Signed)
Subjective:   HPI  Margaret Porter is a 51 y.o. female who presents for Chief Complaint  Patient presents with  . Annual Exam    Physical with fasting labs     Patient Care Team: Veryl Winemiller, Camelia Eng, PA-C as PCP - General (Family Medicine) Michel Bickers, MD as PCP - Infectious Diseases (Infectious Diseases) Servando Salina, MD as Consulting Physician (Obstetrics and Gynecology) Georgia Lopes, DO as Consulting Physician [facility]) Sees dentist Sees eye doctor  Concerns: She has ongoing care through infectious disease clinic  She is still seeing weight loss clinic, on Ozempic and vitamin D.  Her main concern is inability to lose weight.  She does still have trouble resisting fast food at lunch at her convenience.  She is getting some exercise.  She feels like her left lower leg is bigger around in her right lower leg for a while now. No pain, curious about this  Otherwise in usual state of health  Past Medical History:  Diagnosis Date  . Allergic rhinitis   . Allergy   . Food allergy   . HIV infection ([place]) 2006  . Idiopathic urticaria   . Joint pain   . Lytic bone lesions on xray    bone lesions on hip,sternum and spine  . Obesity   . Pre-diabetes    pt not on meds  . Rosacea   . Sickle cell anemia (HCC)    Sickle cell trait with mom  . Swelling of both lower extremities    per pt, left side is more swollen  . Vitamin D deficiency     Family History  Problem Relation Age of Onset  . Diabetes Mother   . Diabetes Brother   . Heart disease Paternal Uncle   . Heart disease Maternal Grandmother   . Cancer Maternal Grandmother        breast  . Heart disease Paternal Grandmother   . Cancer Paternal Grandmother        breast  . Diabetes Father   . High blood pressure Father   . Colon cancer Neg Hx   . Colon polyps Neg Hx   . Esophageal cancer Neg Hx   . Rectal cancer Neg Hx   . Stomach cancer Neg Hx      Current Outpatient Medications:  .   bictegravir-emtricitabine-tenofovir AF (BIKTARVY) 50-200-25 MG TABS tablet, Take 1 tablet by mouth daily., Disp: 30 tablet, Rfl: 11 .  diphenhydrAMINE (BENADRYL) 25 MG tablet, Take 25 mg by mouth every 6 (six) hours as needed for itching or allergies. Reported on 02/29/2016, Disp: , Rfl:  .  nystatin-triamcinolone ointment (MYCOLOG), APPLY TO AFFECTED AREA 2X A DAY FOR 10 DAYS AS NEEDED, Disp: , Rfl:  .  Semaglutide,0.25 or 0.5MG /DOS, (OZEMPIC, 0.25 OR 0.5 MG/DOSE,) 2 MG/1.5ML SOPN, Inject 0.5 mg into the skin once a week., Disp: 1.5 mL, Rfl: 0 .  Vitamin D, Ergocalciferol, (DRISDOL) 1.25 MG (50000 UNIT) CAPS capsule, Take 1 capsule (50,000 Units total) by mouth every 7 (seven) days., Disp: 12 capsule, Rfl: 0  Allergies  Allergen Reactions  . Molds & Smuts     SOB, chest congestion  . Oxycodone     Questionable mouth itching, sweats, but was also simultaneously on Cipro, Flagyl, and Robaxin.  [name] Kitchen Red Dye     Caused a rash with large amounts  . Wellbutrin [Bupropion] Other (See Comments)    Eyes swelled 3 hours after taking one dose  . Amoxicillin Rash  . Aspirin  Anxiety  . Sulfamethoxazole-Trimethoprim Rash     Reviewed their medical, surgical, family, social, medication, and allergy history and updated chart as appropriate.   Review of Systems Constitutional: -fever, -chills, -sweats, -unexpected weight change, -decreased appetite, -fatigue Allergy: -sneezing, -itching, -congestion Dermatology: -changing moles, --rash, -lumps ENT: -runny nose, -ear pain, -sore throat, -hoarseness, -sinus pain, -teeth pain, - ringing in ears, -hearing loss, -nosebleeds Cardiology: -chest pain, -palpitations, -swelling, -difficulty breathing when lying flat, -waking up short of breath Respiratory: -cough, -shortness of breath, -difficulty breathing with exercise or exertion, -wheezing, -coughing up blood Gastroenterology: -abdominal pain, -nausea, -vomiting, -diarrhea, -constipation, -blood in stool,  -changes in bowel movement, -difficulty swallowing or eating Hematology: -bleeding, -bruising  Musculoskeletal: -joint aches, -muscle aches, -joint swelling, -back pain, -neck pain, -cramping, -changes in gait Ophthalmology: denies vision changes, eye redness, itching, discharge Urology: -burning with urination, -difficulty urinating, -blood in urine, -urinary frequency, -urgency, -incontinence Neurology: -headache, -weakness, -tingling, -numbness, -memory loss, -falls, -dizziness Psychology: -depressed mood, -agitation, -sleep problems Breast/gyn: -breast tendnerss, -discharge, -lumps, -vaginal discharge,- irregular periods, -heavy periods     Objective:  BP 122/80   Pulse 81   Ht 5\' 3"  (1.6 m)   Wt 266 lb 9.6 oz (120.9 kg)   SpO2 94%   BMI 47.23 kg/m   General appearance: alert, no distress, WD/WN, African American female Skin: Unremarkable HEENT: normocephalic, conjunctiva/corneas normal, sclerae anicteric, PERRLA, EOMi, nares patent, no discharge or erythema, pharynx normal Oral cavity: MMM, tongue normal, teeth normal Neck: supple, no lymphadenopathy, no thyromegaly, no masses, normal ROM, no bruits Chest: non tender, normal shape and expansion Heart: RRR, normal S1, S2, no murmurs Lungs: CTA bilaterally, no wheezes, rhonchi, or rales Abdomen: +bs, soft, non tender, non distended, no masses, no hepatomegaly, no splenomegaly, no bruits Back: non tender, normal ROM, no scoliosis Musculoskeletal: upper extremities non tender, no obvious deformity, normal ROM throughout, lower extremities non tender, no obvious deformity, normal ROM throughout Extremities: Left lower leg with slight asymmetry larger in diameter than the right lower leg, there is some spider veins of the left lower leg but no obvious mass no obvious edema, no edema, no cyanosis, no clubbing Pulses: 2+ symmetric, upper and lower extremities, normal cap refill Neurological: alert, oriented x 3, CN2-12 intact, strength  normal upper extremities and lower extremities, sensation normal throughout, DTRs 2+ throughout, no cerebellar signs, gait normal Psychiatric: normal affect, behavior normal, pleasant  Breast/gyn/rectal - deferred to gynecology    Assessment and Plan :   Encounter Diagnoses  Name Primary?  . Routine general medical examination at a health care facility Yes  . S/P appendectomy   . Vitamin D deficiency   . Insulin resistance   . Rosacea   . Vaccine counseling   . URINARY INCONTINENCE, URGE   . Seasonal allergies   . Human immunodeficiency virus (HIV) disease ([place])   . Estrogen deficiency   . Pure hypercholesterolemia   . Class 3 severe obesity due to excess calories without serious comorbidity with body mass index (BMI) of 40.0 to 44.9 in adult [facility])   . Perimenopausal   . Bony sclerosis   . URTICARIA, IDIOPATHIC   . Elevated LFTs   . Lipedema   . Venous insufficiency   . Chronic abdominal pain     Today you had a preventative care visit or wellness visit.    Topics today may have included healthy lifestyle, diet, exercise, preventative care, vaccinations, sick and well care, proper use of emergency dept and after hours care,  as well as other concerns.     Recommendations: Continue to return yearly for your annual wellness and preventative care visits.  This gives [name] a chance to discuss healthy lifestyle, exercise, vaccinations, review your chart record, and perform screenings where appropriate.  I recommend you see your eye doctor yearly for routine vision care.  I recommend you see your dentist yearly for routine dental care including hygiene visits twice yearly.  Continue routine follow-up with infectious disease and gynecology    Vaccination recommendations were reviewed  You are up-to-date on tetanus vaccine You are up-to-date on COVID-vaccine You are up-to-date on pneumococcal vaccine You are up-to-date on influenza vaccine  Shingles vaccine:  I recommend you  have a shingles vaccine to help prevent shingles or herpes zoster outbreak.   Please call your insurer to inquire about coverage for the Shingrix vaccine given in 2 doses.   Some insurers cover this vaccine after age 80, some cover this after age 20.  If your insurer covers this, then call to schedule appointment to have this vaccine here.   Screening for cancer: Breast cancer screening: You should perform a self breast exam monthly.   We reviewed recommendations for regular mammograms and breast cancer screening.  Colon cancer screening:  I reviewed your colonoscopy on file that is up to date from 03/2019  Cervical cancer screening: We reviewed recommendations for pap smear screening.  Skin cancer screening: Check your skin regularly for new changes, growing lesions, or other lesions of concern Come in for evaluation if you have skin lesions of concern.  Lung cancer screening: If you have a greater than 30 pack year history of tobacco use, then you qualify for lung cancer screening with a chest CT scan  We currently don't have screenings for other cancers besides breast, cervical, colon, and lung cancers.  If you have a strong family history of cancer or have other cancer screening concerns, please let me know.   We will request last Pap and mammogram from gynecology as we do not have them on file    Bone health: Get at least 150 minutes of aerobic exercise weekly Get weight bearing exercise at least once weekly Bone density test normal 03/2020  Heart health: Get at least 150 minutes of aerobic exercise weekly Limit alcohol It is important to maintain a healthy blood pressure and healthy cholesterol numbers    Separate significant issues discussed: Vitamin D deficiency-continue supplement  HIV disease-managed by infectious disease  Obesity, insulin resistance-currently seeing weight management clinic for this, advise she talk to them about increasing Ozempic and I did  encourage her to consider bariatric surgery consult  Urine incontinence- no recent complaints, stable  Seasonal allergies -no recent complaints  Hypercholesterolemia- reviewed labs from a few months ago, low HDL, counseled on lifestyle, exercise  Elevated LFTs-negative for hepatitis infection per recent labs in the last few months, iron screen today, consider updated abdominal imaging  Abdominal pain chronic- reviewed recent labs in the chart regular, consider updated CT abdomen pelvis or CT abdomen  Venous insufficiency, leg asymmetry- no specific lymphedema/findings but I suspect more of a combination of lipedema and normal variation, consider venous study through interventional radiology  Bony sclerosis evaluated 2020.  Evaluated with mammogram, CT abdomen pelvis, evaluation included SPEP with no M spike, bone density test.  Dr. Irene Limbo with hematology oncology advised she continue vitamin D supplement and return in as needed basis  Demia was seen today for annual exam.  Diagnoses and all orders  for this visit:  Routine general medical examination at a health care facility -     CBC -     Hemoglobin A1c -     Iron  S/P appendectomy  Vitamin D deficiency  Insulin resistance -     Hemoglobin A1c  Rosacea  Vaccine counseling  URINARY INCONTINENCE, URGE  Seasonal allergies  Human immunodeficiency virus (HIV) disease (HCC)  Estrogen deficiency  Pure hypercholesterolemia  Class 3 severe obesity due to excess calories without serious comorbidity with body mass index (BMI) of 40.0 to 44.9 in adult (HCC) -     Hemoglobin A1c  Perimenopausal  Bony sclerosis  URTICARIA, IDIOPATHIC  Elevated LFTs -     Iron  Lipedema  Venous insufficiency  Chronic abdominal pain     Follow-up pending labs, yearly for physical

## 2021-01-14 LAB — CBC
Hematocrit: 38.9 % (ref 34.0–46.6)
Hemoglobin: 12.8 g/dL (ref 11.1–15.9)
MCH: 31.1 pg (ref 26.6–33.0)
MCHC: 32.9 g/dL (ref 31.5–35.7)
MCV: 95 fL (ref 79–97)
Platelets: 267 10*3/uL (ref 150–450)
RBC: 4.11 x10E6/uL (ref 3.77–5.28)
RDW: 13.5 % (ref 11.7–15.4)
WBC: 4.9 10*3/uL (ref 3.4–10.8)

## 2021-01-14 LAB — IRON: Iron: 60 ug/dL (ref 27–159)

## 2021-01-14 LAB — HEMOGLOBIN A1C
Est. average glucose Bld gHb Est-mCnc: 105 mg/dL
Hgb A1c MFr Bld: 5.3 % (ref 4.8–5.6)

## 2021-01-15 LAB — URINE CULTURE

## 2021-01-20 ENCOUNTER — Other Ambulatory Visit: Payer: Self-pay | Admitting: Medical

## 2021-01-20 ENCOUNTER — Telehealth: Payer: Self-pay | Admitting: Medical

## 2021-01-20 DIAGNOSIS — G8929 Other chronic pain: Secondary | ICD-10-CM

## 2021-01-20 DIAGNOSIS — R7989 Other specified abnormal findings of blood chemistry: Secondary | ICD-10-CM

## 2021-01-20 DIAGNOSIS — Z6841 Body Mass Index (BMI) 40.0 and over, adult: Secondary | ICD-10-CM

## 2021-01-20 DIAGNOSIS — Q782 Osteopetrosis: Secondary | ICD-10-CM

## 2021-01-20 DIAGNOSIS — B20 Human immunodeficiency virus [HIV] disease: Secondary | ICD-10-CM

## 2021-01-20 NOTE — Telephone Encounter (Signed)
See her message.  She was agreeable to recommendatoins   1-given the urine findings I would recommend a repeat clean-catch urinalysis specimen at your convenience, preferably a morning sample. When you come in for this make sure the nurse tells you how to do a clean-catch accurate specimen  2-lets work to set up CT abdomen.  Work with me on AIM authorization  3 refer  to [facility] imaging interventional radiology for a leg venous insufficiency evaluation  4-she will talk to the weight loss clinic about increasing Ozempic or considering bariatric surgery consult

## 2021-01-23 ENCOUNTER — Other Ambulatory Visit: Payer: Self-pay

## 2021-01-23 DIAGNOSIS — I872 Venous insufficiency (chronic) (peripheral): Secondary | ICD-10-CM

## 2021-01-23 NOTE — Telephone Encounter (Signed)
Patient informed to come in for clean catch.

## 2021-01-25 ENCOUNTER — Encounter: Payer: Self-pay | Admitting: Infectious Disease

## 2021-01-25 ENCOUNTER — Other Ambulatory Visit (INDEPENDENT_AMBULATORY_CARE_PROVIDER_SITE_OTHER): Payer: Self-pay | Admitting: Family Medicine

## 2021-01-25 DIAGNOSIS — E559 Vitamin D deficiency, unspecified: Secondary | ICD-10-CM

## 2021-01-25 LAB — CD4/CD8 (T-HELPER/T-SUPPRESSOR CELL): HIV 1 RNA UltraQuant: <40

## 2021-01-25 NOTE — Telephone Encounter (Signed)
This patient was last seen by Lacie Draft and currently has an upcoming appt scheduled on 01/26/21 with her.

## 2021-01-26 ENCOUNTER — Encounter (INDEPENDENT_AMBULATORY_CARE_PROVIDER_SITE_OTHER): Payer: Self-pay | Admitting: Family Medicine

## 2021-01-26 ENCOUNTER — Ambulatory Visit (INDEPENDENT_AMBULATORY_CARE_PROVIDER_SITE_OTHER): Payer: BC Managed Care – PPO | Admitting: Family Medicine

## 2021-01-26 ENCOUNTER — Other Ambulatory Visit: Payer: Self-pay

## 2021-01-26 VITALS — BP 122/78 | HR 77 | Temp 97.3°F | Ht 63.0 in | Wt 259.0 lb

## 2021-01-26 DIAGNOSIS — E559 Vitamin D deficiency, unspecified: Secondary | ICD-10-CM

## 2021-01-26 DIAGNOSIS — Z9189 Other specified personal risk factors, not elsewhere classified: Secondary | ICD-10-CM

## 2021-01-26 DIAGNOSIS — E8881 Metabolic syndrome: Secondary | ICD-10-CM | POA: Diagnosis not present

## 2021-01-26 DIAGNOSIS — Z6841 Body Mass Index (BMI) 40.0 and over, adult: Secondary | ICD-10-CM

## 2021-01-26 DIAGNOSIS — E88819 Insulin resistance, unspecified: Secondary | ICD-10-CM

## 2021-01-26 MED ORDER — OZEMPIC (1 MG/DOSE) 2 MG/1.5ML ~~LOC~~ SOPN: 1.0000 mg | PEN_INJECTOR | SUBCUTANEOUS | 0 refills | Status: DC

## 2021-01-26 MED ORDER — VITAMIN D (ERGOCALCIFEROL) 1.25 MG (50000 UNIT) PO CAPS: 50000.0000 [IU] | ORAL_CAPSULE | ORAL | 0 refills | Status: DC

## 2021-01-27 LAB — HEMOGLOBIN A1C
Est. average glucose Bld gHb Est-mCnc: 105 mg/dL
Hgb A1c MFr Bld: 5.3 % (ref 4.8–5.6)

## 2021-01-27 LAB — VITAMIN D 25 HYDROXY (VIT D DEFICIENCY, FRACTURES): Vit D, 25-Hydroxy: 56.2 ng/mL (ref 30.0–100.0)

## 2021-01-27 LAB — INSULIN, RANDOM: INSULIN: 43.5 u[IU]/mL — ABNORMAL HIGH (ref 2.6–24.9)

## 2021-01-30 ENCOUNTER — Encounter (INDEPENDENT_AMBULATORY_CARE_PROVIDER_SITE_OTHER): Payer: Self-pay | Admitting: Family Medicine

## 2021-01-30 NOTE — Progress Notes (Signed)
Chief Complaint:   OBESITY Margaret Porter is here to discuss her progress with her obesity treatment plan along with follow-up of her obesity related diagnoses. [name] is on practicing portion control and making smarter food choices, such as increasing vegetables and decreasing simple carbohydrates and states she is following her eating plan approximately 75% of the time. Margaret Porter states she is doing 0 minutes 0 times per week.  Today's visit was #: 65 Starting weight: 262 lbs Starting date: 11/12/2018 Today's weight: 259 lbs Today's date: 01/26/2021 Total lbs lost to date: 3 Total lbs lost since last in-office visit: 3  Interim History: Margaret Porter is still eating out and skipping meals. She says she lacks time to prep her lunch. She still skips breakfast. She notes Ozempic is increasing satiety. She has always skipped meals, even before we started Ozempic.  Subjective:   1. Vitamin D deficiency Margaret Porter's last Vit D level was low at 42.5. She is on weekly prescription Vit D.  2. Insulin resistance Margaret Porter's appetite and satiety has improved with Ozempic 0.5 mg.   Lab Results  Component Value Date   INSULIN 43.5 (H) 01/26/2021   INSULIN 35.7 (H) 06/06/2020   INSULIN 30.4 (H) 02/16/2020   INSULIN 18.2 06/29/2019   INSULIN 21.1 11/12/2018   Lab Results  Component Value Date   HGBA1C 5.3 01/26/2021   3. At risk for impaired metabolic function Margaret Porter is at increased risk for impaired metabolic function due to skipping meals, lack of protein, and lack of exercise.  Assessment/Plan:   1. Vitamin D deficiency  We will check labs today, and we will refill prescription Vitamin D for 1 month. Margaret Porter will follow-up for routine testing of Vitamin D, at least 2-3 times per year to avoid over-replacement.  - VITAMIN D 25 Hydroxy (Vit-D Deficiency, Fractures) - Vitamin D, Ergocalciferol, (DRISDOL) 1.25 MG (50000 UNIT) CAPS capsule; Take 1 capsule (50,000 Units total) by mouth every  7 (seven) days.  Dispense: 12 capsule; Refill: 0  2. Insulin resistance . We will check labs today. Margaret Porter agreed to increase Ozempic to 1 mg weekly with no refills. She agreed to follow-up with [name] as directed to closely monitor her progress.  - Hemoglobin A1c - Insulin, random - Semaglutide, 1 MG/DOSE, (OZEMPIC, 1 MG/DOSE,) 2 MG/1.5ML SOPN; Inject 1 mg into the skin once a week.  Dispense: 3 mL; Refill: 0  3. At risk for impaired metabolic function Margaret Porter was given approximately 15 minutes of impaired  metabolic function prevention counseling today. We discussed intensive lifestyle modifications today with an emphasis on specific nutrition and exercise instructions and strategies.   Repetitive spaced learning was employed today to elicit superior memory formation and behavioral change.  4. Class 3 severe obesity with serious comorbidity and body mass index (BMI) of 45.0 to 49.9 in adult, unspecified obesity type (HCC) Margaret Porter is currently in the action stage of change. As such, her goal is to continue with weight loss efforts. She has agreed to practicing portion control and making smarter food choices, such as increasing vegetables and decreasing simple carbohydrates.   We discussed  taking lunch meat and bread to work on Monday so she will have lunch ingredients for the rest of the week.  She will work on having a shake or protein bar for breakfast.  Exercise goals: Margaret Porter will try to fit in some exercise.  Behavioral modification strategies: decreasing eating out, no skipping meals and meal planning and cooking strategies.  Margaret Porter has agreed to follow-up  with our clinic in 3 weeks.  Margaret Porter was informed we would discuss her lab results at her next visit unless there is a critical issue that needs to be addressed sooner. Margaret Porter agreed to keep her next visit at the agreed upon time to discuss these results.  Objective:   Blood pressure 122/78, pulse 77, temperature (!) 97.3  F (36.3 C), height 5\' 3"  (1.6 m), weight 259 lb (117.5 kg), SpO2 96 %. Body mass index is 45.88 kg/m.  General: Cooperative, alert, well developed, in no acute distress. HEENT: Conjunctivae and lids unremarkable. Cardiovascular: Regular rhythm.  Lungs: Normal work of breathing. Neurologic: No focal deficits.   Lab Results  Component Value Date   CREATININE 0.91 12/20/2020   BUN 12 12/20/2020   NA 139 12/20/2020   K 3.8 12/20/2020   CL 107 12/20/2020   CO2 21 12/20/2020   Lab Results  Component Value Date   ALT 44 (H) 12/20/2020   AST 36 (H) 12/20/2020   ALKPHOS 135 (H) 06/06/2020   BILITOT 0.4 12/20/2020   Lab Results  Component Value Date   HGBA1C 5.3 01/26/2021   HGBA1C 5.3 01/13/2021   HGBA1C 5.4 06/06/2020   HGBA1C 5.4 02/11/2020   HGBA1C 5.0 06/29/2019   Lab Results  Component Value Date   INSULIN 43.5 (H) 01/26/2021   INSULIN 35.7 (H) 06/06/2020   INSULIN 30.4 (H) 02/16/2020   INSULIN 18.2 06/29/2019   INSULIN 21.1 11/12/2018   Lab Results  Component Value Date   TSH 2.760 02/16/2020   Lab Results  Component Value Date   CHOL 164 10/27/2020   HDL 44 (L) 10/27/2020   LDLCALC 95 10/27/2020   TRIG 149 10/27/2020   CHOLHDL 3.7 10/27/2020   Lab Results  Component Value Date   WBC 4.9 01/13/2021   HGB 12.8 01/13/2021   HCT 38.9 01/13/2021   MCV 95 01/13/2021   PLT 267 01/13/2021   Lab Results  Component Value Date   IRON 60 01/13/2021   Attestation Statements:   Reviewed by clinician on day of visit: allergies, medications, problem list, medical history, surgical history, family history, social history, and previous encounter notes.   Wilhemena Durie, am acting as Location manager for [employer], FNP-C.  I have reviewed the above documentation for accuracy and completeness, and I agree with the above. -  Georgianne Fick, FNP

## 2021-02-06 ENCOUNTER — Ambulatory Visit
Admission: RE | Admit: 2021-02-06 | Discharge: 2021-02-06 | Disposition: A | Discharge status: Home / Self Care | Payer: BC Managed Care – PPO | Source: Ambulatory Visit | Attending: Medical | Admitting: Medical

## 2021-02-06 ENCOUNTER — Other Ambulatory Visit: Payer: Self-pay

## 2021-02-06 DIAGNOSIS — R7989 Other specified abnormal findings of blood chemistry: Secondary | ICD-10-CM

## 2021-02-06 DIAGNOSIS — G8929 Other chronic pain: Secondary | ICD-10-CM

## 2021-02-06 DIAGNOSIS — Q782 Osteopetrosis: Secondary | ICD-10-CM

## 2021-02-06 DIAGNOSIS — B20 Human immunodeficiency virus [HIV] disease: Secondary | ICD-10-CM

## 2021-02-06 MED ORDER — IOPAMIDOL (ISOVUE-300) INJECTION 61%
100.0000 mL | Freq: Once | INTRAVENOUS | Status: AC | PRN
  Administered 2021-02-06: 100 mL via INTRAVENOUS

## 2021-02-09 ENCOUNTER — Telehealth: Payer: Self-pay | Admitting: Medical

## 2021-02-09 NOTE — Telephone Encounter (Signed)
Requested records received from [facility].  

## 2021-02-10 ENCOUNTER — Telehealth: Payer: Self-pay | Admitting: Medical

## 2021-02-10 NOTE — Telephone Encounter (Signed)
Records received from [facility]

## 2021-02-10 NOTE — Telephone Encounter (Signed)
[facility] records received

## 2021-02-13 ENCOUNTER — Encounter: Payer: Self-pay | Admitting: Medical

## 2021-02-14 ENCOUNTER — Other Ambulatory Visit: Payer: Self-pay | Admitting: Internal Medicine

## 2021-02-14 ENCOUNTER — Encounter (INDEPENDENT_AMBULATORY_CARE_PROVIDER_SITE_OTHER): Payer: Self-pay | Admitting: Family Medicine

## 2021-02-14 ENCOUNTER — Other Ambulatory Visit: Payer: Self-pay

## 2021-02-14 ENCOUNTER — Ambulatory Visit (INDEPENDENT_AMBULATORY_CARE_PROVIDER_SITE_OTHER): Payer: BC Managed Care – PPO | Admitting: Family Medicine

## 2021-02-14 VITALS — BP 129/62 | HR 92 | Temp 98.4°F | Ht 63.0 in | Wt 261.0 lb

## 2021-02-14 DIAGNOSIS — E88819 Insulin resistance, unspecified: Secondary | ICD-10-CM

## 2021-02-14 DIAGNOSIS — Z9189 Other specified personal risk factors, not elsewhere classified: Secondary | ICD-10-CM | POA: Diagnosis not present

## 2021-02-14 DIAGNOSIS — E559 Vitamin D deficiency, unspecified: Secondary | ICD-10-CM | POA: Diagnosis not present

## 2021-02-14 DIAGNOSIS — K760 Fatty (change of) liver, not elsewhere classified: Secondary | ICD-10-CM | POA: Diagnosis not present

## 2021-02-14 DIAGNOSIS — E8881 Metabolic syndrome: Secondary | ICD-10-CM

## 2021-02-14 DIAGNOSIS — Z6841 Body Mass Index (BMI) 40.0 and over, adult: Secondary | ICD-10-CM

## 2021-02-14 MED ORDER — WEGOVY 1.7 MG/0.75ML ~~LOC~~ SOAJ: 1.7000 mg | SUBCUTANEOUS | 0 refills | Status: DC

## 2021-02-15 ENCOUNTER — Encounter (INDEPENDENT_AMBULATORY_CARE_PROVIDER_SITE_OTHER): Payer: Self-pay | Admitting: Family Medicine

## 2021-02-15 NOTE — Progress Notes (Signed)
Chief Complaint:   OBESITY Margaret Porter is here to discuss her progress with her obesity treatment plan along with follow-up of her obesity related diagnoses. [name] is on practicing portion control and making smarter food choices, such as increasing vegetables and decreasing simple carbohydrates and states she is following her eating plan approximately 75% of the time. [name] states she is doing 0 minutes 0 times per week.  Today's visit was #: 70 Starting weight: 262 lbs Starting date: 11/12/2018 Today's weight: 261 lbs Today's date: 02/14/2021 Total lbs lost to date: 1 lb Total lbs lost since last in-office visit: +2  Interim History: Pt tends to eat out once daily, usually at lunch. She says she lacks time to pack lunch. We have discussed meal prep in the past but it seems to be a recurring obstacle. She has her own refrigerator at work to bring food for the whole week. She does drink sweet tea at times. She skips breakfast most days and then has fast food for lunch.  Subjective:   1. Hepatic steatosis Pt recently had a CT scan that detected fatty liver. Lab Results  Component Value Date   ALT 44 (H) 12/20/2020   AST 36 (H) 12/20/2020   ALKPHOS 135 (H) 06/06/2020   BILITOT 0.4 12/20/2020     2. Insulin resistance Pt's fasting insulin level is very high at 43.5 with an A1c at 5.3. She has very strong family history of diabetes mellitus- parents and siblings. She is on 1 mg Ozempic and does not feel it helps much with appetite..  Lab Results  Component Value Date   INSULIN 43.5 (H) 01/26/2021   INSULIN 35.7 (H) 06/06/2020   INSULIN 30.4 (H) 02/16/2020   INSULIN 18.2 06/29/2019   INSULIN 21.1 11/12/2018   Lab Results  Component Value Date   HGBA1C 5.3 01/26/2021    3. Vitamin D deficiency Margaret Porter's Vitamin D level is at goal at 56.2 on 01/26/2021. She is currently taking prescription vitamin D 50,000 IU each week.    Ref. Range 01/26/2021 08:54  Vitamin D,  25-Hydroxy Latest Ref Range: 30.0 - 100.0 ng/mL 56.2   4. At risk for diabetes mellitus Margaret Porter is at higher than average risk for developing diabetes due to obesity, insulin resistance, and family history of diabetes mellitus.  Assessment/Plan:   1. Hepatic steatosis We dicussed losing 10% of body weight will improve fatty liver.  2. Insulin resistance  Continue meal plan. Discontinue Ozempic.  3. Vitamin D deficiency  She agrees to continue to take prescription Vitamin D @50 ,000 IU every week and will follow-up for routine testing of Vitamin D, at least 2-3 times per year to avoid over-replacement.  4. At risk for diabetes mellitus Margaret Porter was given approximately 15 minutes of diabetes education and counseling today. We discussed intensive lifestyle modifications today with an emphasis on weight loss as well as increasing exercise and decreasing simple carbohydrates in her diet. We also reviewed medication options with an emphasis on risk versus benefit of those discussed.   Repetitive spaced learning was employed today to elicit superior memory formation and behavioral change.  5. Class 3 severe obesity with serious comorbidity and body mass index (BMI) of 45.0 to 49.9 in adult, unspecified obesity type (HCC) Margaret Porter is currently in the action stage of change. As such, her goal is to continue with weight loss efforts. She has agreed to practicing portion control and making smarter food choices, such as increasing vegetables and decreasing simple carbohydrates.  We discussed various medication options to help Margaret Porter with her weight loss efforts and we both agreed to start [facility] 1.7 mg, as per below. - Semaglutide-Weight Management (WEGOVY) 1.7 MG/0.75ML SOAJ; Inject 1.7 mg into the skin once a week.  Dispense: 3 mL; Refill: 0  Exercise goals: As is  Behavioral modification strategies: decreasing liquid calories, decreasing eating out, no skipping meals and meal planning and  cooking strategies.  [name] has agreed to follow-up with our clinic in 3 weeks.   Objective:   Blood pressure 129/62, pulse 92, temperature 98.4 F (36.9 C), height 5\' 3"  (1.6 m), weight 261 lb (118.4 kg), SpO2 98 %. Body mass index is 46.23 kg/m.  General: Cooperative, alert, well developed, in no acute distress. HEENT: Conjunctivae and lids unremarkable. Cardiovascular: Regular rhythm.  Lungs: Normal work of breathing. Neurologic: No focal deficits.   Lab Results  Component Value Date   CREATININE 0.91 12/20/2020   BUN 12 12/20/2020   NA 139 12/20/2020   K 3.8 12/20/2020   CL 107 12/20/2020   CO2 21 12/20/2020   Lab Results  Component Value Date   ALT 44 (H) 12/20/2020   AST 36 (H) 12/20/2020   ALKPHOS 135 (H) 06/06/2020   BILITOT 0.4 12/20/2020   Lab Results  Component Value Date   HGBA1C 5.3 01/26/2021   HGBA1C 5.3 01/13/2021   HGBA1C 5.4 06/06/2020   HGBA1C 5.4 02/11/2020   HGBA1C 5.0 06/29/2019   Lab Results  Component Value Date   INSULIN 43.5 (H) 01/26/2021   INSULIN 35.7 (H) 06/06/2020   INSULIN 30.4 (H) 02/16/2020   INSULIN 18.2 06/29/2019   INSULIN 21.1 11/12/2018   Lab Results  Component Value Date   TSH 2.760 02/16/2020   Lab Results  Component Value Date   CHOL 164 10/27/2020   HDL 44 (L) 10/27/2020   LDLCALC 95 10/27/2020   TRIG 149 10/27/2020   CHOLHDL 3.7 10/27/2020   Lab Results  Component Value Date   WBC 4.9 01/13/2021   HGB 12.8 01/13/2021   HCT 38.9 01/13/2021   MCV 95 01/13/2021   PLT 267 01/13/2021   Lab Results  Component Value Date   IRON 60 01/13/2021    Attestation Statements:   Reviewed by clinician on day of visit: allergies, medications, problem list, medical history, surgical history, family history, social history, and previous encounter notes.  Coral Ceo, am acting as Location manager for [employer], [place].  I have reviewed the above documentation for accuracy and completeness, and I agree  with the above. -  Georgianne Fick, FNP

## 2021-02-17 ENCOUNTER — Encounter: Payer: Self-pay | Admitting: Medical

## 2021-02-21 ENCOUNTER — Encounter: Payer: Self-pay | Admitting: Nurse Practitioner

## 2021-03-05 NOTE — Progress Notes (Signed)
03/05/2021 EMMILYNN Porter [id number] 26-Apr-1970   CHIEF COMPLAINT: Abdominal pain, elevated LFTs  HISTORY OF PRESENT ILLNESS:  Margaret Porter is a 51 year old female with a past medical history of obesity, HIV on BIKTARVY followed by ID, sickle cell trait, prediabetes and hepatic steatosis.  Past appendectomy. She presents to our office today as referred by Chana Bode  for further evaluation regarding episodic central abdominal pain and elevated LFTs. Episodes of central abdominal pain lasts 15 to 20 minutes and occurs every 4 to 6 months which initially started in 2018 shortly after her appendectomy surgery. Her last 2 episodes occurred August 2021 and Dec. 2021. The most recent episode occurred while she was driving Dec. 4854, at that time she pulled off the side of the road and got out of her car to walk around. The tight central abdominal pain abated within 15 minutes. No associated nausea or vomiting. No further episodes of central abdominal pain since then. She passes a normal solid stool once daily. BMs are a little harder since starting [facility]. She infrequently sees a small amount of blood on the toile tissue if she strains to pass a BM.  An abdominal/pelvic CT scan 02/06/2021 was unrevealing.  She underwent a screening colonoscopy by Dr. Silverio Decamp 03/09/2019 which identified diverticulosis to the sigmoid colon and nonbleeding internal hemorrhoids otherwise was normal.  She has gained 30 pounds over the past year.  CBC Latest Ref Rng & Units 01/13/2021 02/11/2020 11/09/2019  WBC 3.4 - 10.8 x10E3/uL 4.9 4.4 5.1  Hemoglobin 11.1 - 15.9 g/dL 12.8 14.6 14.1  Hematocrit 34.0 - 46.6 % 38.9 42.9 42.9  Platelets 150 - 450 x10E3/uL 267 221 236   CMP Latest Ref Rng & Units 12/20/2020 10/27/2020 07/19/2020  Glucose 65 - 99 mg/dL 88 97 109(H)  BUN 7 - 25 mg/dL 12 12 18   Creatinine 0.50 - 1.05 mg/dL 0.91 0.92 1.10(H)  Sodium 135 - 146 mmol/L 139 137 139  Potassium 3.5 - 5.3 mmol/L 3.8 4.3  4.3  Chloride 98 - 110 mmol/L 107 105 104  CO2 20 - 32 mmol/L 21 24 21   Calcium 8.6 - 10.4 mg/dL 8.7 9.0 8.9  Total Protein 6.1 - 8.1 g/dL 5.8(L) 6.1 6.2  Total Bilirubin 0.2 - 1.2 mg/dL 0.4 0.3 0.4  Alkaline Phos 48 - 121 IU/L - - -  AST 10 - 35 U/L 36(H) 16 17  ALT 6 - 29 U/L 44(H) 14 15    CTAP with contrast 02/06/2021: Lower chest: No acute abnormality. Hepatobiliary: No solid liver abnormality is seen. Subtle fatty deposition adjacent to the ligamentum venosum. No gallstones, gallbladder wall thickening, or biliary dilatation. Pancreas: Unremarkable. No pancreatic ductal dilatation or surrounding inflammatory changes. Spleen: Normal in size without focal abnormality. Adrenals/Urinary Tract: Adrenal glands are unremarkable. Kidneys are normal, without renal calculi, focal lesion, or hydronephrosis. Bladder is unremarkable. Stomach/Bowel: Stomach is within normal limits. No evidence of bowel wall thickening, distention, or inflammatory changes. Vascular/Lymphatic: No significant vascular findings are present. No enlarged abdominal or pelvic lymph nodes. Other: No abdominal wall hernia or abnormality. No abdominopelvic ascites. Musculoskeletal: No acute or significant osseous findings. IMPRESSION: No CT findings of the abdomen to explain left-sided abdominal pain  Screening colonoscopy by Dr. Silverio Decamp 03/09/2019: - Diverticulosis in the sigmoid colon. - Non-bleeding internal hemorrhoids. - The examination was otherwise normal. - No specimens collected.   Past Medical History:  Diagnosis Date  . Allergic rhinitis   . Allergy   . Food allergy   .  HIV infection ([place]) 2006  . Idiopathic urticaria   . Joint pain   . Lytic bone lesions on xray    bone lesions on hip,sternum and spine  . Obesity   . Pre-diabetes    pt not on meds  . Rosacea   . Sickle cell anemia (HCC)    Sickle cell trait with mom  . Swelling of both lower extremities    per pt, left side is  more swollen  . Vitamin D deficiency    Past Surgical History:  Procedure Laterality Date  . COLONOSCOPY  03/09/2019   diverticulosis sigmoid, int hemorrhoids, Dr. Harl Bowie  . DILATATION & CURRETTAGE/HYSTEROSCOPY WITH RESECTOCOPE N/A 06/27/2015   Procedure: DILATATION & CURETTAGE/HYSTEROSCOPY WITH RESECTOCOPE;  Surgeon: Servando Salina, MD;  Location: [facility];  Service: Gynecology;  Laterality: N/A;  . LAPAROSCOPIC APPENDECTOMY N/A 09/07/2017   Procedure: APPENDECTOMY LAPAROSCOPIC;  Surgeon: Clovis Riley, MD;  Location: [place];  Service: General;  Laterality: N/A;   Social History: She is single.  She is an Tourist information centre manager.  She is a non-smoker.  She drinks 1 alcoholic beverage weekly.  No history of drug use.  Family History: Mother with history of diabetes.  Father with history of hypertension, diabetes and heart disease.  Maternal grandmother and paternal grandmother with history of breast cancer.   Allergies  Allergen Reactions  . Molds & Smuts     SOB, chest congestion  . Oxycodone     Questionable mouth itching, sweats, but was also simultaneously on Cipro, Flagyl, and Robaxin.  [name] Kitchen Red Dye     Caused a rash with large amounts  . Wellbutrin [Bupropion] Other (See Comments)    Eyes swelled 3 hours after taking one dose  . Amoxicillin Rash  . Aspirin Anxiety  . Sulfamethoxazole-Trimethoprim Rash      Outpatient Encounter Medications as of 03/08/2021  Medication Sig  . bictegravir-emtricitabine-tenofovir AF (BIKTARVY) 50-200-25 MG TABS tablet Take 1 tablet by mouth daily.  . diphenhydrAMINE (BENADRYL) 25 MG tablet Take 25 mg by mouth every 6 (six) hours as needed for itching or allergies. Reported on 02/29/2016  . nystatin-triamcinolone ointment (MYCOLOG) APPLY TO AFFECTED AREA 2X A DAY FOR 10 DAYS AS NEEDED  . Semaglutide-Weight Management (WEGOVY) 1.7 MG/0.75ML SOAJ Inject 1.7 mg into the skin once a week.  . Vitamin D, Ergocalciferol, (DRISDOL) 1.25 MG (50000 UNIT) CAPS  capsule Take 1 capsule (50,000 Units total) by mouth every 7 (seven) days.   No facility-administered encounter medications on file as of 03/08/2021.    REVIEW OF SYSTEMS:  Gen: Denies fever, sweats or chills. No weight loss.  CV: Denies chest pain, palpitations or edema. Resp: Denies cough, shortness of breath of hemoptysis.  GI: See HPI. GU : Denies urinary burning, blood in urine, increased urinary frequency or incontinence. MS: + back pain. Derm: Denies rash, itchiness, skin lesions or unhealing ulcers. Psych: Denies depression, anxiety, memory loss, suicidal ideation and confusion. Heme: Denies bruising, bleeding. Neuro:  Denies headaches, dizziness or paresthesias. Endo:  Denies any problems with DM, thyroid or adrenal function.  PHYSICAL EXAM: BP 118/72   Pulse 65   Ht 5\' 3"  (1.6 m)   Wt 266 lb (120.7 kg)   BMI 47.12 kg/m   Wt Readings from Last 3 Encounters:  03/08/21 266 lb (120.7 kg)  03/07/21 258 lb (117 kg)  02/14/21 261 lb (118.4 kg)   General: Well developed 51 year old female in no acute distress. Head: Normocephalic and atraumatic. Eyes:  Sclerae non-icteric, conjunctive pink. Ears: Normal auditory acuity. Mouth: Dentition intact. Scattered missing teeth. No ulcers or lesions.  Neck: Supple, no lymphadenopathy or thyromegaly.  Lungs: Clear bilaterally to auscultation without wheezes, crackles or rhonchi. Heart: Regular rate and rhythm. No murmur, rub or gallop appreciated.  Abdomen: Soft, nontender, non distended. No masses. No hepatosplenomegaly. Normoactive bowel sounds x 4 quadrants.  Rectal: Deferred.  Musculoskeletal: Symmetrical with no gross deformities. Skin: Warm and dry. No rash or lesions on visible extremities. Extremities: No edema. Neurological: Alert oriented x 4, no focal deficits.  Psychological:  Alert and cooperative. Normal mood and affect.  ASSESSMENT AND PLAN:  79. 51 year old female HIV + with infrequent central abdominal pain which  started following her appendectomy surgery in 2018.  Last episode occurred 11/2020 while she was driving and resolved after she got out of the car, stood up and walked around for 15 minutes.  No further episodes of central abdominal pain since that time.  CTAP 02/06/2021 was unrevealing. -Patient to call our office if abdominal pain recurs -Hyoscyamine 0.125 mg 1 tab sublingual every 6 to 8 hours as needed for abdominal pain  2. Constipation -Stool softener daily  3. Rectal bleeding, most likely hemorrhoidal.  Colonoscopy 03/09/2019 showed left-sided diverticulosis and internal hemorrhoids.  No polyps. -Patient to call office if rectal bleeding recurs  -Recall colonoscopy previously recommended 02/2029, consider recall colonoscopy 03/08/2024 due to infrequent rectal bleeding. Further recommendations per Dr. Silverio Decamp.   4.  History of mildly elevated LFTs -Hepatic panel      CC:  Tysinger, Camelia Eng, PA-C

## 2021-03-07 ENCOUNTER — Ambulatory Visit (INDEPENDENT_AMBULATORY_CARE_PROVIDER_SITE_OTHER): Payer: BC Managed Care – PPO | Admitting: Family Medicine

## 2021-03-07 ENCOUNTER — Other Ambulatory Visit: Payer: Self-pay

## 2021-03-07 ENCOUNTER — Encounter (INDEPENDENT_AMBULATORY_CARE_PROVIDER_SITE_OTHER): Payer: Self-pay | Admitting: Family Medicine

## 2021-03-07 VITALS — BP 115/76 | HR 83 | Temp 97.4°F | Ht 63.0 in | Wt 258.0 lb

## 2021-03-07 DIAGNOSIS — K760 Fatty (change of) liver, not elsewhere classified: Secondary | ICD-10-CM

## 2021-03-07 DIAGNOSIS — Z6841 Body Mass Index (BMI) 40.0 and over, adult: Secondary | ICD-10-CM | POA: Diagnosis not present

## 2021-03-07 DIAGNOSIS — R632 Polyphagia: Secondary | ICD-10-CM | POA: Diagnosis not present

## 2021-03-07 DIAGNOSIS — Z9189 Other specified personal risk factors, not elsewhere classified: Secondary | ICD-10-CM

## 2021-03-07 MED ORDER — WEGOVY 1.7 MG/0.75ML ~~LOC~~ SOAJ: 1.7000 mg | SUBCUTANEOUS | 0 refills | Status: DC

## 2021-03-08 ENCOUNTER — Encounter: Payer: Self-pay | Admitting: Nurse Practitioner

## 2021-03-08 ENCOUNTER — Ambulatory Visit: Payer: BC Managed Care – PPO | Admitting: Nurse Practitioner

## 2021-03-08 ENCOUNTER — Other Ambulatory Visit (INDEPENDENT_AMBULATORY_CARE_PROVIDER_SITE_OTHER): Payer: BC Managed Care – PPO

## 2021-03-08 VITALS — BP 118/72 | HR 65 | Ht 63.0 in | Wt 266.0 lb

## 2021-03-08 DIAGNOSIS — R7989 Other specified abnormal findings of blood chemistry: Secondary | ICD-10-CM | POA: Diagnosis not present

## 2021-03-08 DIAGNOSIS — K625 Hemorrhage of anus and rectum: Secondary | ICD-10-CM

## 2021-03-08 DIAGNOSIS — R1033 Periumbilical pain: Secondary | ICD-10-CM

## 2021-03-08 LAB — HEPATIC FUNCTION PANEL
ALT: 16 U/L (ref 0–35)
AST: 16 U/L (ref 0–37)
Albumin: 3.9 g/dL (ref 3.5–5.2)
Alkaline Phosphatase: 98 U/L (ref 39–117)
Bilirubin, Direct: 0.1 mg/dL (ref 0.0–0.3)
Total Bilirubin: 0.4 mg/dL (ref 0.2–1.2)
Total Protein: 6.4 g/dL (ref 6.0–8.3)

## 2021-03-08 MED ORDER — HYOSCYAMINE SULFATE 0.125 MG SL SUBL: 0.1250 mg | SUBLINGUAL_TABLET | Freq: Three times a day (TID) | SUBLINGUAL | 0 refills | Status: DC | PRN

## 2021-03-08 NOTE — Patient Instructions (Addendum)
If you are age 53 or younger, your body mass index should be between 19-25. Your Body mass index is 47.12 kg/m. If this is out of the aformentioned range listed, please consider follow up with your Primary Care Provider.   LABS:  Lab work has been ordered for you today. Our lab is located in the basement. Press "B" on the elevator. The lab is located at the first door on the left as you exit the elevator.  HEALTHCARE LAWS AND MY CHART RESULTS: Due to recent changes in healthcare laws, you may see the results of your imaging and laboratory studies on MyChart before your provider has had a chance to review them.   We understand that in some cases there may be results that are confusing or concerning to you. Not all laboratory results come back in the same time frame and the provider may be waiting for multiple results in order to interpret others.  Please give [name] 48 hours in order for your provider to thoroughly review all the results before contacting the office for clarification of your results.   MEDICATION We have sent the following medication to your pharmacy for you to pick up at your convenience: Hyoscyamine 3 times a day if needed for abdominal pain. If your insurance does not cover it, please go to GoodRx to get the discount price.  RECOMMENDATIONS:  Use a daily stool softener. Call us if abdominal pain or rectal bleeding recurs.   It was great seeing you today! Thank you for entrusting me with your care and choosing [facility].  Noralyn Pick, CRNP

## 2021-03-08 NOTE — Progress Notes (Signed)
Chief Complaint:   OBESITY Margaret Porter is here to discuss her progress with her obesity treatment plan along with follow-up of her obesity related diagnoses. Margaret Porter is on practicing portion control and making smarter food choices, such as increasing vegetables and decreasing simple carbohydrates and states she is following her eating plan approximately 75% of the time. Margaret Porter states she is doing 0 minutes 0 times per week.  Today's visit was #: 73 Starting weight: 262 lbs Starting date: 11/12/2018 Today's weight: 258 lbs Today's date: 03/07/2021 Total lbs lost to date: 4 Total lbs lost since last in-office visit: 3  Interim History: Margaret Porter feels she is skipping less meals. She is eating breakfast in the morning. She ate out less and packed her lunch more frequently. She is down 3 lbs today.  Subjective:   1. Polyphagia Margaret Porter notes some hunger and she tends to chose eating out as an option more than she should. She notes harder stools.  2. Hepatic steatosis Margaret Porter has documented fatty liver.  3. At risk for side effect of medication Margaret Porter is at risk for drug side effects due to constipation with [facility].  Assessment/Plan:   1. Polyphagia Intensive lifestyle modifications are the first line treatment for this issue. We discussed several lifestyle modifications today. We will refill Wevogy for 1 month. Stana will start daily stool softener.  - Semaglutide-Weight Management (WEGOVY) 1.7 MG/0.75ML SOAJ; Inject 1.7 mg into the skin once a week.  Dispense: 3 mL; Refill: 0  2. Hepatic steatosis Margaret Porter will continue to work on weight loss.  3. At risk for side effect of medication Margaret Porter was given approximately 15 minutes of drug side effect counseling today.  We discussed side effect possibility and risk versus benefits. Margaret Porter agreed to the medication and will contact this office if these side effects are intolerable.  Repetitive spaced learning was employed  today to elicit superior memory formation and behavioral change.  4. Class 3 severe obesity with serious comorbidity and body mass index (BMI) of 45.0 to 49.9 in adult, unspecified obesity type (HCC) Margaret Porter is currently in the action stage of change. As such, her goal is to continue with weight loss efforts. She has agreed to practicing portion control and making smarter food choices, such as increasing vegetables and decreasing simple carbohydrates.   Margaret Porter will continue to grocery shop and pack her lunch.  Exercise goals: No exercise has been prescribed at this time.  Behavioral modification strategies: increasing lean protein intake, decreasing eating out and no skipping meals.  Margaret Porter has agreed to follow-up with our clinic in 3 weeks.   Objective:   Blood pressure 115/76, pulse 83, temperature (!) 97.4 F (36.3 C), height 5\' 3"  (1.6 m), weight 258 lb (117 kg), SpO2 97 %. Body mass index is 45.7 kg/m.  General: Cooperative, alert, well developed, in no acute distress. HEENT: Conjunctivae and lids unremarkable. Cardiovascular: Regular rhythm.  Lungs: Normal work of breathing. Neurologic: No focal deficits.   Lab Results  Component Value Date   CREATININE 0.91 12/20/2020   BUN 12 12/20/2020   NA 139 12/20/2020   K 3.8 12/20/2020   CL 107 12/20/2020   CO2 21 12/20/2020   Lab Results  Component Value Date   ALT 44 (H) 12/20/2020   AST 36 (H) 12/20/2020   ALKPHOS 135 (H) 06/06/2020   BILITOT 0.4 12/20/2020   Lab Results  Component Value Date   HGBA1C 5.3 01/26/2021   HGBA1C 5.3 01/13/2021   HGBA1C 5.4 06/06/2020  HGBA1C 5.4 02/11/2020   HGBA1C 5.0 06/29/2019   Lab Results  Component Value Date   INSULIN 43.5 (H) 01/26/2021   INSULIN 35.7 (H) 06/06/2020   INSULIN 30.4 (H) 02/16/2020   INSULIN 18.2 06/29/2019   INSULIN 21.1 11/12/2018   Lab Results  Component Value Date   TSH 2.760 02/16/2020   Lab Results  Component Value Date   CHOL 164  10/27/2020   HDL 44 (L) 10/27/2020   LDLCALC 95 10/27/2020   TRIG 149 10/27/2020   CHOLHDL 3.7 10/27/2020   Lab Results  Component Value Date   WBC 4.9 01/13/2021   HGB 12.8 01/13/2021   HCT 38.9 01/13/2021   MCV 95 01/13/2021   PLT 267 01/13/2021   Lab Results  Component Value Date   IRON 60 01/13/2021   Attestation Statements:   Reviewed by clinician on day of visit: allergies, medications, problem list, medical history, surgical history, family history, social history, and previous encounter notes.   Wilhemena Durie, am acting as Location manager for [employer], FNP-C.  I have reviewed the above documentation for accuracy and completeness, and I agree with the above. -  Georgianne Fick, FNP

## 2021-03-09 ENCOUNTER — Encounter (INDEPENDENT_AMBULATORY_CARE_PROVIDER_SITE_OTHER): Payer: Self-pay | Admitting: Family Medicine

## 2021-03-09 DIAGNOSIS — R632 Polyphagia: Secondary | ICD-10-CM | POA: Insufficient documentation

## 2021-03-28 ENCOUNTER — Encounter (INDEPENDENT_AMBULATORY_CARE_PROVIDER_SITE_OTHER): Payer: Self-pay | Admitting: Family Medicine

## 2021-03-28 ENCOUNTER — Ambulatory Visit (INDEPENDENT_AMBULATORY_CARE_PROVIDER_SITE_OTHER): Payer: BC Managed Care – PPO | Admitting: Family Medicine

## 2021-03-28 ENCOUNTER — Other Ambulatory Visit: Payer: Self-pay

## 2021-03-28 VITALS — BP 129/78 | HR 83 | Temp 97.9°F | Ht 63.0 in | Wt 254.0 lb

## 2021-03-28 DIAGNOSIS — K760 Fatty (change of) liver, not elsewhere classified: Secondary | ICD-10-CM | POA: Diagnosis not present

## 2021-03-28 DIAGNOSIS — R1030 Lower abdominal pain, unspecified: Secondary | ICD-10-CM | POA: Diagnosis not present

## 2021-03-28 DIAGNOSIS — Z9189 Other specified personal risk factors, not elsewhere classified: Secondary | ICD-10-CM

## 2021-03-28 DIAGNOSIS — Z6841 Body Mass Index (BMI) 40.0 and over, adult: Secondary | ICD-10-CM

## 2021-03-28 MED ORDER — WEGOVY 2.4 MG/0.75ML ~~LOC~~ SOAJ: 2.4000 mg | SUBCUTANEOUS | 0 refills | Status: DC

## 2021-04-02 ENCOUNTER — Other Ambulatory Visit: Payer: Self-pay

## 2021-04-02 ENCOUNTER — Ambulatory Visit (INDEPENDENT_AMBULATORY_CARE_PROVIDER_SITE_OTHER): Payer: BC Managed Care – PPO

## 2021-04-02 ENCOUNTER — Encounter (HOSPITAL_COMMUNITY): Payer: Self-pay

## 2021-04-02 ENCOUNTER — Ambulatory Visit (HOSPITAL_COMMUNITY)
Admission: EM | Admit: 2021-04-02 | Discharge: 2021-04-02 | Disposition: A | Discharge status: Home / Self Care | Payer: BC Managed Care – PPO | Attending: Emergency Medicine | Admitting: Emergency Medicine

## 2021-04-02 DIAGNOSIS — M25572 Pain in left ankle and joints of left foot: Secondary | ICD-10-CM | POA: Diagnosis not present

## 2021-04-02 DIAGNOSIS — W19XXXA Unspecified fall, initial encounter: Secondary | ICD-10-CM

## 2021-04-02 DIAGNOSIS — B20 Human immunodeficiency virus [HIV] disease: Secondary | ICD-10-CM | POA: Diagnosis not present

## 2021-04-02 MED ORDER — MELOXICAM 15 MG PO TABS: 15.0000 mg | ORAL_TABLET | Freq: Every day | ORAL | 0 refills | Status: AC

## 2021-04-02 NOTE — ED Triage Notes (Signed)
Pt present left foot pain/heel and ankle. Pt states she slipped on jolly rancher and almost went down but twisted her foot.

## 2021-04-02 NOTE — Discharge Instructions (Addendum)
Take Meloxicam once daily for pain and inflammation. Please follow up with Ortho as needed.

## 2021-04-02 NOTE — ED Provider Notes (Signed)
[place]  ____________________________________________  Time seen: Approximately 1:50 PM  I have reviewed the triage vital signs and the nursing notes.   HISTORY  Chief Complaint Foot Pain (Left )   Historian Patient     HPI Margaret Porter is a 51 y.o. female with a history of HIV, presents to the urgent care with acute left ankle pain.  Patient slipped on a [employer] on Friday.  She states that she has been able to bear weight but states that she has been hobbling some.  No abrasions or lacerations.  No similar ankle pain in the past.  Patient has not attempted any alleviating medications.   Past Medical History:  Diagnosis Date  . Allergic rhinitis   . Allergy   . Food allergy   . HIV infection ([place]) 2006  . Idiopathic urticaria   . Joint pain   . Lytic bone lesions on xray    bone lesions on hip,sternum and spine  . Obesity   . Pre-diabetes    pt not on meds  . Rosacea   . Sickle cell anemia (HCC)    Sickle cell trait with mom  . Swelling of both lower extremities    per pt, left side is more swollen  . Vitamin D deficiency      Immunizations up to date:  Yes.     Past Medical History:  Diagnosis Date  . Allergic rhinitis   . Allergy   . Food allergy   . HIV infection ([place]) 2006  . Idiopathic urticaria   . Joint pain   . Lytic bone lesions on xray    bone lesions on hip,sternum and spine  . Obesity   . Pre-diabetes    pt not on meds  . Rosacea   . Sickle cell anemia (HCC)    Sickle cell trait with mom  . Swelling of both lower extremities    per pt, left side is more swollen  . Vitamin D deficiency     Patient Active Problem List   Diagnosis Date Noted  . Polyphagia 03/09/2021  . Hepatic steatosis 02/14/2021  . Lipedema 01/13/2021  . Elevated LFTs 01/13/2021  . Venous insufficiency 01/13/2021  . Chronic abdominal pain 01/13/2021  . Seasonal allergies 05/16/2020  . Estrogen deficiency 01/11/2020  . Perimenopausal  01/11/2020  . Bony sclerosis 02/03/2019  . S/P appendectomy 01/20/2019  . Vaccine counseling 01/20/2019  . Insulin resistance 12/09/2018  . Routine general medical examination at a health care facility 12/22/2015  . Vitamin D deficiency 12/22/2015  . Need for pneumococcal vaccination 12/22/2015  . Rosacea 04/22/2012  . URINARY INCONTINENCE, URGE 10/24/2010  . Hyperlipidemia 11/06/2007  . Obesity 11/06/2007  . URTICARIA, IDIOPATHIC 03/06/2007  . Human immunodeficiency virus (HIV) disease ([place]) 01/24/2007    Past Surgical History:  Procedure Laterality Date  . COLONOSCOPY  03/09/2019   diverticulosis sigmoid, int hemorrhoids, Dr. Harl Bowie  . DILATATION & CURRETTAGE/HYSTEROSCOPY WITH RESECTOCOPE N/A 06/27/2015   Procedure: DILATATION & CURETTAGE/HYSTEROSCOPY WITH RESECTOCOPE;  Surgeon: Servando Salina, MD;  Location: [facility];  Service: Gynecology;  Laterality: N/A;  . LAPAROSCOPIC APPENDECTOMY N/A 09/07/2017   Procedure: APPENDECTOMY LAPAROSCOPIC;  Surgeon: Clovis Riley, MD;  Location: [facility];  Service: General;  Laterality: N/A;    Prior to Admission medications   Medication Sig Start Date End Date Taking? Authorizing Provider  meloxicam (MOBIC) 15 MG tablet Take 1 tablet (15 mg total) by mouth daily for 7 days. 04/02/21 04/09/21 Yes Sherral Hammers,  Conley Rolls, PA-C  bictegravir-emtricitabine-tenofovir AF (BIKTARVY) 50-200-25 MG TABS tablet Take 1 tablet by mouth daily. 11/15/20   Michel Bickers, MD  diphenhydrAMINE (BENADRYL) 25 MG tablet Take 25 mg by mouth every 6 (six) hours as needed for itching or allergies. Reported on 02/29/2016    [provider]  hyoscyamine (LEVSIN SL) 0.125 MG SL tablet Place 1 tablet (0.125 mg total) under the tongue 3 (three) times daily as needed. 03/08/21   Noralyn Pick, NP  nystatin-triamcinolone ointment (MYCOLOG) APPLY TO AFFECTED AREA 2X A DAY FOR 10 DAYS AS NEEDED 09/30/19   [provider]  Semaglutide-Weight Management  (WEGOVY) 2.4 MG/0.75ML SOAJ Inject 2.4 mg into the skin once a week. 03/28/21   Whitmire, Joneen Boers, FNP  Vitamin D, Ergocalciferol, (DRISDOL) 1.25 MG (50000 UNIT) CAPS capsule Take 1 capsule (50,000 Units total) by mouth every 7 (seven) days. 01/26/21   Whitmire, Joneen Boers, FNP    Allergies Molds & smuts, Oxycodone, Red dye, Wellbutrin [bupropion], Amoxicillin, Aspirin, and Sulfamethoxazole-trimethoprim  Family History  Problem Relation Age of Onset  . Diabetes Mother   . Diabetes Brother   . Heart disease Paternal Uncle   . Heart disease Maternal Grandmother   . Cancer Maternal Grandmother        breast  . Heart disease Paternal Grandmother   . Cancer Paternal Grandmother        breast  . Diabetes Father   . High blood pressure Father   . Colon cancer Neg Hx   . Colon polyps Neg Hx   . Esophageal cancer Neg Hx   . Rectal cancer Neg Hx   . Stomach cancer Neg Hx     Social History Social History   Tobacco Use  . Smoking status: Never Smoker  . Smokeless tobacco: Never Used  Vaping Use  . Vaping Use: Never used  Substance Use Topics  . Alcohol use: Yes    Alcohol/week: 0.0 standard drinks    Comment: occasional (special occasions)  . Drug use: No     Review of Systems  Constitutional: No fever/chills Eyes:  No discharge ENT: No upper respiratory complaints. Respiratory: no cough. No SOB/ use of accessory muscles to breath Gastrointestinal:   No nausea, no vomiting.  No diarrhea.  No constipation. Musculoskeletal: Patient has left ankle pain.  Skin: Negative for rash, abrasions, lacerations, ecchymosis.    ____________________________________________   PHYSICAL EXAM:  VITAL SIGNS: ED Triage Vitals  Enc Vitals Group     BP 04/02/21 1249 (!) 133/103     Pulse Rate 04/02/21 1249 74     Resp 04/02/21 1249 16     Temp 04/02/21 1249 98.6 F (37 C)     Temp Source 04/02/21 1249 Temporal     SpO2 04/02/21 1249 97 %     Weight --      Height --      Head  Circumference --      Peak Flow --      Pain Score 04/02/21 1250 5     Pain Loc --      Pain Edu? --      Excl. in [place]? --      Constitutional: Alert and oriented. Well appearing and in no acute distress. Eyes: Conjunctivae are normal. PERRL. EOMI. Head: Atraumatic. ENT: Cardiovascular: Normal rate, regular rhythm. Normal S1 and S2.  Good peripheral circulation. Respiratory: Normal respiratory effort without tachypnea or retractions. Lungs CTAB. Good air entry to the bases with no decreased  or absent breath sounds Gastrointestinal: Bowel sounds x 4 quadrants. Soft and nontender to palpation. No guarding or rigidity. No distention. Musculoskeletal: Patient demonstrates full range of motion at the left ankle.  She is able to move all 5 left toes.  She does have tenderness to palpation of the anterior talofibular ligament.  Achilles tendon is intact with no palpable deficit over the insertion of the calcaneus. Palpable dorsalis pedis pulse, left.  Neurologic:  Normal for age. No gross focal neurologic deficits are appreciated.  Skin:  Skin is warm, dry and intact. No rash noted. Psychiatric: Mood and affect are normal for age. Speech and behavior are normal.   ____________________________________________   LABS (all labs ordered are listed, but only abnormal results are displayed)  Labs Reviewed - No data to display ____________________________________________  EKG   ____________________________________________  RADIOLOGY Unk Pinto, personally viewed and evaluated these images (plain radiographs) as part of my medical decision making, as well as reviewing the written report by the radiologist.  Patient has some calcifications near achilles tendon. No acute fractures.   No results found.  ____________________________________________    PROCEDURES  Procedure(s) performed:     Procedures     Medications - No data to  display   ____________________________________________   INITIAL IMPRESSION / ASSESSMENT AND PLAN / ED COURSE  Pertinent labs & imaging results that were available during my care of the patient were reviewed by me and considered in my medical decision making (see chart for details).      Assessment and plan Ankle pain 51 year old female presents to the urgent care with acute left ankle pain after an inversion type ankle injury that occurred on Friday.  X-ray was reviewed by myself and shows no acute bony abnormality.  There are some calcifications near the Achilles tendon no acute fractures.  Patient was placed in a lace up ankle brace.  Patient reports that she has crutches at home.  She was discharged with Meloxicam.  All patient questions were answered.     ____________________________________________  FINAL CLINICAL IMPRESSION(S) / ED DIAGNOSES  Final diagnoses:  Pain of joint of left ankle and foot      NEW MEDICATIONS STARTED DURING THIS VISIT:  ED Discharge Orders         Ordered    meloxicam (MOBIC) 15 MG tablet  Daily        04/02/21 1347              This chart was dictated using voice recognition software/Dragon. Despite best efforts to proofread, errors can occur which can change the meaning. Any change was purely unintentional.     Lannie Fields, PA-C 04/02/21 1354

## 2021-04-04 ENCOUNTER — Encounter (INDEPENDENT_AMBULATORY_CARE_PROVIDER_SITE_OTHER): Payer: Self-pay | Admitting: Family Medicine

## 2021-04-04 NOTE — Progress Notes (Signed)
Chief Complaint:   OBESITY Margaret Porter is here to discuss her progress with her obesity treatment plan along with follow-up of her obesity related diagnoses. Margaret Porter is on practicing portion control and making smarter food choices, such as increasing vegetables and decreasing simple carbohydrates and states she is following her eating plan approximately 75% of the time. Margaret Porter states she is not exercising regularly.  Today's visit was #: 50 Starting weight: 262 lbs Starting date: 11/12/2018 Today's weight: 254 lbs Today's date: 03/28/2021 Total lbs lost to date: 8 lbs Total lbs lost since last in-office visit: 4 lbs  Interim History: Margaret Porter feels that the [facility] makes her more hungry, however, she is losing weight.  She continues to skip breakfast.  She is packing lunch more often than she used to.  She is cooking more at dinner.  She tends to get full more quickly.  Denies nausea or constipation. She is doing better with not skipping meals on the weekends.  She has 1-2 sugar-sweetened beverages per week.  Subjective:   1. Lower abdominal pain She is seeing GI for abdominal pain.  Nothing seen on CT.  She reports pain has now subsided.  She is to keep a journal of when she has the pain.  2. Hepatic steatosis Recent CT showed fatty liver.  ALT/AST within normal limits (03/08/2021).  3. At risk for side effect of medication Margaret Porter is at risk for medication side effect due to increasing her dose of Wegovy.  Assessment/Plan:   1. Lower abdominal pain Follow-up with GI as needed.  2. Hepatic steatosis Continue to work on weight loss.  3. At risk for side effect of medication Margaret Porter was given approximately 15 minutes of drug side effect counseling today.  We discussed side effect possibility and risk versus benefits. Margaret Porter agreed to the medication and will contact this office if these side effects are intolerable.  Repetitive spaced learning was employed today to elicit  superior memory formation and behavioral change.  4. Class 3 severe obesity with serious comorbidity and body mass index (BMI) of 45.0 to 49.9 in adult, unspecified obesity type (HCC)  - Increase Semaglutide-Weight Management (WEGOVY) 2.4 MG/0.75ML SOAJ; Inject 2.4 mg into the skin once a week.  Dispense: 3 mL; Refill: 0  Margaret Porter is currently in the action stage of change. As such, her goal is to continue with weight loss efforts. She has agreed to practicing portion control and making smarter food choices, such as increasing vegetables and decreasing simple carbohydrates.   She will try to have a protein bar for breakfast.  Increase dose of Wegovy to 2.4 mg subcutaneously weekly.  Exercise goals: Start walking a few days per week.  Behavioral modification strategies: decreasing simple carbohydrates, increasing water intake and decreasing liquid calories.  Margaret Porter has agreed to follow-up with our clinic in 3 weeks.  Objective:   Blood pressure 129/78, pulse 83, temperature 97.9 F (36.6 C), height 5\' 3"  (1.6 m), weight 254 lb (115.2 kg), SpO2 97 %. Body mass index is 44.99 kg/m.  General: Cooperative, alert, well developed, in no acute distress. HEENT: Conjunctivae and lids unremarkable. Cardiovascular: Regular rhythm.  Lungs: Normal work of breathing. Neurologic: No focal deficits.   Lab Results  Component Value Date   CREATININE 0.91 12/20/2020   BUN 12 12/20/2020   NA 139 12/20/2020   K 3.8 12/20/2020   CL 107 12/20/2020   CO2 21 12/20/2020   Lab Results  Component Value Date   ALT 16 03/08/2021  AST 16 03/08/2021   ALKPHOS 98 03/08/2021   BILITOT 0.4 03/08/2021   Lab Results  Component Value Date   HGBA1C 5.3 01/26/2021   HGBA1C 5.3 01/13/2021   HGBA1C 5.4 06/06/2020   HGBA1C 5.4 02/11/2020   HGBA1C 5.0 06/29/2019   Lab Results  Component Value Date   INSULIN 43.5 (H) 01/26/2021   INSULIN 35.7 (H) 06/06/2020   INSULIN 30.4 (H) 02/16/2020   INSULIN  18.2 06/29/2019   INSULIN 21.1 11/12/2018   Lab Results  Component Value Date   TSH 2.760 02/16/2020   Lab Results  Component Value Date   CHOL 164 10/27/2020   HDL 44 (L) 10/27/2020   LDLCALC 95 10/27/2020   TRIG 149 10/27/2020   CHOLHDL 3.7 10/27/2020   Lab Results  Component Value Date   WBC 4.9 01/13/2021   HGB 12.8 01/13/2021   HCT 38.9 01/13/2021   MCV 95 01/13/2021   PLT 267 01/13/2021   Lab Results  Component Value Date   IRON 60 01/13/2021   Attestation Statements:   Reviewed by clinician on day of visit: allergies, medications, problem list, medical history, surgical history, family history, social history, and previous encounter notes.  I, Water quality scientist, CMA, am acting as Location manager for [employer], [place].  I have reviewed the above documentation for accuracy and completeness, and I agree with the above. -  Georgianne Fick, FNP

## 2021-04-17 ENCOUNTER — Ambulatory Visit (INDEPENDENT_AMBULATORY_CARE_PROVIDER_SITE_OTHER): Payer: BC Managed Care – PPO | Admitting: Family Medicine

## 2021-04-18 NOTE — Progress Notes (Signed)
Reviewed and agree with documentation and assessment and plan. K. Veena Christabella Alvira , MD   

## 2021-04-19 ENCOUNTER — Ambulatory Visit (INDEPENDENT_AMBULATORY_CARE_PROVIDER_SITE_OTHER): Payer: BC Managed Care – PPO | Admitting: Family Medicine

## 2021-04-19 ENCOUNTER — Other Ambulatory Visit: Payer: Self-pay

## 2021-04-19 ENCOUNTER — Encounter (INDEPENDENT_AMBULATORY_CARE_PROVIDER_SITE_OTHER): Payer: Self-pay | Admitting: Family Medicine

## 2021-04-19 VITALS — BP 117/79 | HR 81 | Temp 97.7°F | Ht 63.0 in | Wt 252.0 lb

## 2021-04-19 DIAGNOSIS — Z6841 Body Mass Index (BMI) 40.0 and over, adult: Secondary | ICD-10-CM

## 2021-04-19 DIAGNOSIS — E559 Vitamin D deficiency, unspecified: Secondary | ICD-10-CM

## 2021-04-19 DIAGNOSIS — E8881 Metabolic syndrome: Secondary | ICD-10-CM

## 2021-04-19 DIAGNOSIS — E88819 Insulin resistance, unspecified: Secondary | ICD-10-CM

## 2021-04-19 MED ORDER — WEGOVY 2.4 MG/0.75ML ~~LOC~~ SOAJ: 2.4000 mg | SUBCUTANEOUS | 0 refills | Status: DC

## 2021-04-20 NOTE — Progress Notes (Signed)
Chief Complaint:   OBESITY Margaret Porter is here to discuss her progress with her obesity treatment plan along with follow-up of her obesity related diagnoses. Margaret Porter is on practicing portion control and making smarter food choices, such as increasing vegetables and decreasing simple carbohydrates and states she is following her eating plan approximately 75% of the time. Margaret Porter states she is not exercising regularly.  Today's visit was #: 33 Starting weight: 262 lbs Starting date: 11/12/2018 Today's weight: 252 lbs Today's date: 04/19/2021 Total lbs lost to date: 10 lbs Total lbs lost since last in-office visit: 2 lbs  Interim History: Margaret Porter notes she is skipping less meals and packing lunch more.  She is cooking more at supper.  She admits to drinking quite a bit of soda this week.  She also drinks plenty of water.  On 2.4 mg Wegovy weekly. Appetite controlled.   Subjective:   1. Vitamin D deficiency Vitamin D at goal.  On weekly prescription vitamin D.  2. Insulin resistance Notes Mancel Parsons is helping with polyphagia.   Lab Results  Component Value Date   INSULIN 43.5 (H) 01/26/2021   INSULIN 35.7 (H) 06/06/2020   INSULIN 30.4 (H) 02/16/2020   INSULIN 18.2 06/29/2019   INSULIN 21.1 11/12/2018   Lab Results  Component Value Date   HGBA1C 5.3 01/26/2021   Assessment/Plan:   1. Vitamin D deficiency Continue vitamin D 50,000 IU weekly.  2. Insulin resistance Continue meal plan.  3. Obesity: Current BMI 44  - Refill Semaglutide-Weight Management (WEGOVY) 2.4 MG/0.75ML SOAJ; Inject 2.4 mg into the skin once a week.  Dispense: 3 mL; Refill: 0  Margaret Porter is currently in the action stage of change. As such, her goal is to continue with weight loss efforts. She has agreed to practicing portion control and making smarter food choices, such as increasing vegetables and decreasing simple carbohydrates.   Exercise goals: Discussed starting exercise 2 times weekly for 20  minutes.  Behavioral modification strategies: increasing lean protein intake and decreasing liquid calories.  Margaret Porter has agreed to follow-up with our clinic in 3 weeks.   Objective:   Blood pressure 117/79, pulse 81, temperature 97.7 F (36.5 C), height 5\' 3"  (1.6 m), weight 252 lb (114.3 kg), SpO2 97 %. Body mass index is 44.64 kg/m.  General: Cooperative, alert, well developed, in no acute distress. HEENT: Conjunctivae and lids unremarkable. Cardiovascular: Regular rhythm.  Lungs: Normal work of breathing. Neurologic: No focal deficits.   Lab Results  Component Value Date   CREATININE 0.91 12/20/2020   BUN 12 12/20/2020   NA 139 12/20/2020   K 3.8 12/20/2020   CL 107 12/20/2020   CO2 21 12/20/2020   Lab Results  Component Value Date   ALT 16 03/08/2021   AST 16 03/08/2021   ALKPHOS 98 03/08/2021   BILITOT 0.4 03/08/2021   Lab Results  Component Value Date   HGBA1C 5.3 01/26/2021   HGBA1C 5.3 01/13/2021   HGBA1C 5.4 06/06/2020   HGBA1C 5.4 02/11/2020   HGBA1C 5.0 06/29/2019   Lab Results  Component Value Date   INSULIN 43.5 (H) 01/26/2021   INSULIN 35.7 (H) 06/06/2020   INSULIN 30.4 (H) 02/16/2020   INSULIN 18.2 06/29/2019   INSULIN 21.1 11/12/2018   Lab Results  Component Value Date   TSH 2.760 02/16/2020   Lab Results  Component Value Date   CHOL 164 10/27/2020   HDL 44 (L) 10/27/2020   LDLCALC 95 10/27/2020   TRIG 149 10/27/2020  CHOLHDL 3.7 10/27/2020   Lab Results  Component Value Date   WBC 4.9 01/13/2021   HGB 12.8 01/13/2021   HCT 38.9 01/13/2021   MCV 95 01/13/2021   PLT 267 01/13/2021   Lab Results  Component Value Date   IRON 60 01/13/2021   Attestation Statements:   Reviewed by clinician on day of visit: allergies, medications, problem list, medical history, surgical history, family history, social history, and previous encounter notes.  I, Water quality scientist, CMA, am acting as Location manager for [employer], [place].  I have  reviewed the above documentation for accuracy and completeness, and I agree with the above. -  Georgianne Fick, FNP

## 2021-04-24 ENCOUNTER — Encounter (INDEPENDENT_AMBULATORY_CARE_PROVIDER_SITE_OTHER): Payer: Self-pay | Admitting: Family Medicine

## 2021-05-15 ENCOUNTER — Encounter (INDEPENDENT_AMBULATORY_CARE_PROVIDER_SITE_OTHER): Payer: Self-pay | Admitting: Family Medicine

## 2021-05-15 NOTE — Telephone Encounter (Signed)
Last seen Dawn 

## 2021-05-16 ENCOUNTER — Ambulatory Visit (INDEPENDENT_AMBULATORY_CARE_PROVIDER_SITE_OTHER): Payer: BC Managed Care – PPO | Admitting: Family Medicine

## 2021-05-16 ENCOUNTER — Encounter (INDEPENDENT_AMBULATORY_CARE_PROVIDER_SITE_OTHER): Payer: Self-pay | Admitting: Family Medicine

## 2021-05-16 ENCOUNTER — Other Ambulatory Visit: Payer: Self-pay

## 2021-05-16 VITALS — BP 110/76 | HR 85 | Temp 97.9°F | Ht 63.0 in | Wt 254.0 lb

## 2021-05-16 DIAGNOSIS — E559 Vitamin D deficiency, unspecified: Secondary | ICD-10-CM | POA: Diagnosis not present

## 2021-05-16 DIAGNOSIS — Z6841 Body Mass Index (BMI) 40.0 and over, adult: Secondary | ICD-10-CM | POA: Diagnosis not present

## 2021-05-16 DIAGNOSIS — Z9189 Other specified personal risk factors, not elsewhere classified: Secondary | ICD-10-CM

## 2021-05-16 MED ORDER — WEGOVY 2.4 MG/0.75ML ~~LOC~~ SOAJ: 2.4000 mg | SUBCUTANEOUS | 0 refills | Status: DC

## 2021-05-16 MED ORDER — VITAMIN D (ERGOCALCIFEROL) 1.25 MG (50000 UNIT) PO CAPS: 50000.0000 [IU] | ORAL_CAPSULE | ORAL | 0 refills | Status: DC

## 2021-05-18 NOTE — Progress Notes (Signed)
Chief Complaint:   OBESITY Margaret Porter is here to discuss her progress with her obesity treatment plan along with follow-up of her obesity related diagnoses.   Today's visit was #: 63 Starting weight: 262 lbs Starting date: 11/12/2018 Today's weight: 254 lbs Today's date: 05/16/2021 Weight change since last visit: +2 lbs Total lbs lost to date: 8 lbs Body mass index is 44.99 kg/m.  Total weight loss percentage to date: -3.05%  Interim History:  Margaret Porter says she has been eating out more than usual lately.  Skips meals and does not eat until 4 or 5 pm on Saturday and Sunday.  No issues with plan.  Appetite is good/intact, but she says that she sometimes does not want to get up out of bed to eat.  She feels she would do better on a set plan.  Current Meal Plan: practicing portion control and making smarter food choices, such as increasing vegetables and decreasing simple carbohydrates for 75% of the time.  Current Exercise Plan: Walking 7,500 steps per day 5 days per week. Current Anti-Obesity Medications: Wegovy 2.4 mg subcutaneously weekly. Side effects: None.  Assessment/Plan:   Medications Discontinued During This Encounter  Medication Reason  . Vitamin D, Ergocalciferol, (DRISDOL) 1.25 MG (50000 UNIT) CAPS capsule Reorder  . Semaglutide-Weight Management (WEGOVY) 2.4 MG/0.75ML SOAJ Reorder    Meds ordered this encounter  Medications  . Semaglutide-Weight Management (WEGOVY) 2.4 MG/0.75ML SOAJ    Sig: Inject 2.4 mg into the skin once a week.    Dispense:  3 mL    Refill:  0  . Vitamin D, Ergocalciferol, (DRISDOL) 1.25 MG (50000 UNIT) CAPS capsule    Sig: Take 1 capsule (50,000 Units total) by mouth every 7 (seven) days.    Dispense:  12 capsule    Refill:  0    1. Vitamin D deficiency At goal. Current vitamin D is 56.2, tested on 01/26/2021. Optimal goal > 50 ng/dL.  She is taking vitamin D 50,000 IU weekly.  Plan: Continue to take prescription Vitamin D @50 ,000 IU  every week as prescribed.  Follow-up for routine testing of Vitamin D, at least 2-3 times per year to avoid over-replacement.  - Refill Vitamin D, Ergocalciferol, (DRISDOL) 1.25 MG (50000 UNIT) CAPS capsule; Take 1 capsule (50,000 Units total) by mouth every 7 (seven) days.  Dispense: 12 capsule; Refill: 0  2. At risk for activity intolerance Margaret Porter was given approximately 9 minutes of counseling today regarding her increased risk for exercise intolerance.  She is at increased risk due to left foot injury (heel spur) that is improving.  We discussed patient's specific personal and medical issues that raise our concern.  She was advised of strategies to prevent injury and ways to improve her cardiopulmonary fitness levels slowly over time.  We additionally discussed various fitness trackers and smart phone apps to help motivate the patient to stay on track.   3. Obesity, current BMI 45.1  - Refill Semaglutide-Weight Management (WEGOVY) 2.4 MG/0.75ML SOAJ; Inject 2.4 mg into the skin once a week.  Dispense: 3 mL; Refill: 0  Course: Margaret Porter is currently in the action stage of change. As such, her goal is to continue with weight loss efforts.   Nutrition goals: She has agreed to the Category 3 Plan.   Exercise goals: Increase walking distance as tolerated.  Behavioral modification strategies: increasing lean protein intake, decreasing eating out, no skipping meals and meal planning and cooking strategies.  Margaret Porter has agreed to follow-up with our  clinic in 2-3 weeks with Jake Bathe, [place]. She was informed of the importance of frequent follow-up visits to maximize her success with intensive lifestyle modifications for her multiple health conditions.   Objective:   Blood pressure 110/76, pulse 85, temperature 97.9 F (36.6 C), height 5\' 3"  (1.6 m), weight 254 lb (115.2 kg), SpO2 97 %. Body mass index is 44.99 kg/m.  General: Cooperative, alert, well developed, in no acute  distress. HEENT: Conjunctivae and lids unremarkable. Cardiovascular: Regular rhythm.  Lungs: Normal work of breathing. Neurologic: No focal deficits.   Lab Results  Component Value Date   CREATININE 0.91 12/20/2020   BUN 12 12/20/2020   NA 139 12/20/2020   K 3.8 12/20/2020   CL 107 12/20/2020   CO2 21 12/20/2020   Lab Results  Component Value Date   ALT 16 03/08/2021   AST 16 03/08/2021   ALKPHOS 98 03/08/2021   BILITOT 0.4 03/08/2021   Lab Results  Component Value Date   HGBA1C 5.3 01/26/2021   HGBA1C 5.3 01/13/2021   HGBA1C 5.4 06/06/2020   HGBA1C 5.4 02/11/2020   HGBA1C 5.0 06/29/2019   Lab Results  Component Value Date   INSULIN 43.5 (H) 01/26/2021   INSULIN 35.7 (H) 06/06/2020   INSULIN 30.4 (H) 02/16/2020   INSULIN 18.2 06/29/2019   INSULIN 21.1 11/12/2018   Lab Results  Component Value Date   TSH 2.760 02/16/2020   Lab Results  Component Value Date   CHOL 164 10/27/2020   HDL 44 (L) 10/27/2020   LDLCALC 95 10/27/2020   TRIG 149 10/27/2020   CHOLHDL 3.7 10/27/2020   Lab Results  Component Value Date   WBC 4.9 01/13/2021   HGB 12.8 01/13/2021   HCT 38.9 01/13/2021   MCV 95 01/13/2021   PLT 267 01/13/2021   Lab Results  Component Value Date   IRON 60 01/13/2021   Attestation Statements:   Reviewed by clinician on day of visit: allergies, medications, problem list, medical history, surgical history, family history, social history, and previous encounter notes.  I, Water quality scientist, CMA, am acting as Location manager for [employer], [place].  I have reviewed the above documentation for accuracy and completeness, and I agree with the above. Marjory Sneddon, D.O.  The [place] was signed into law in 2016 which includes the topic of electronic health records.  This provides immediate access to information in MyChart.  This includes consultation notes, operative notes, office notes, lab results and pathology reports.  If you have any  questions about what you read please let us know at your next visit so we can discuss your concerns and take corrective action if need be.  We are right here with you.

## 2021-05-25 ENCOUNTER — Encounter (INDEPENDENT_AMBULATORY_CARE_PROVIDER_SITE_OTHER): Payer: Self-pay

## 2021-06-06 ENCOUNTER — Encounter (INDEPENDENT_AMBULATORY_CARE_PROVIDER_SITE_OTHER): Payer: Self-pay | Admitting: Family Medicine

## 2021-06-06 ENCOUNTER — Ambulatory Visit (INDEPENDENT_AMBULATORY_CARE_PROVIDER_SITE_OTHER): Payer: BC Managed Care – PPO | Admitting: Family Medicine

## 2021-06-06 ENCOUNTER — Other Ambulatory Visit: Payer: Self-pay

## 2021-06-06 VITALS — BP 101/70 | HR 74 | Temp 98.3°F | Ht 63.0 in | Wt 255.0 lb

## 2021-06-06 DIAGNOSIS — Z6841 Body Mass Index (BMI) 40.0 and over, adult: Secondary | ICD-10-CM

## 2021-06-06 DIAGNOSIS — E559 Vitamin D deficiency, unspecified: Secondary | ICD-10-CM | POA: Diagnosis not present

## 2021-06-12 ENCOUNTER — Encounter (INDEPENDENT_AMBULATORY_CARE_PROVIDER_SITE_OTHER): Payer: Self-pay | Admitting: Family Medicine

## 2021-06-12 NOTE — Progress Notes (Signed)
Chief Complaint:   OBESITY Margaret Porter is here to discuss her progress with her obesity treatment plan along with follow-up of her obesity related diagnoses. Margaret Porter is on the Category 3 Plan and states she is following her eating plan approximately 75% of the time. Margaret Porter states she is not exercising regularly.  Today's visit was #: 55 Starting weight: 262 lbs Starting date: 11/12/2018 Today's weight: 255 lbs Today's date: 06/06/2021 Total lbs lost to date: 7 lbs Total lbs lost since last in-office visit: +1 lb  Interim History: Margaret Porter is skipping breakfast most days.  She eats out most lunches.  Tends to eat at home for supper.  She is going to [place] this Saturday.  [place]inclusive resort.  Subjective:   1. Vitamin D deficiency Vitamin D at goal (56.2).  on weekly prescription vitamin D.  Assessment/Plan:   1. Vitamin D deficiency Continue prescription vitamin D. Will check vitamin D at next office visit.  2. Obesity, current BMI 45.18  Margaret Porter is currently in the action stage of change. As such, her goal is to continue with weight loss efforts. She has agreed to the Category 3 Plan.   Encouraged her to plan to have breakfast everyday.   She will cook more at home.  Exercise goals: All adults should avoid inactivity. Some physical activity is better than none, and adults who participate in any amount of physical activity gain some health benefits.  Behavioral modification strategies: increasing lean protein intake, no skipping meals, meal planning and cooking strategies, and travel eating strategies.  Margaret Porter has agreed to follow-up with our clinic in 4 weeks, fasting.  Objective:   Blood pressure 101/70, pulse 74, temperature 98.3 F (36.8 C), height 5\' 3"  (1.6 m), weight 255 lb (115.7 kg), SpO2 97 %. Body mass index is 45.17 kg/m.  General: Cooperative, alert, well developed, in no acute distress. HEENT: Conjunctivae and lids  unremarkable. Cardiovascular: Regular rhythm.  Lungs: Normal work of breathing. Neurologic: No focal deficits.   Lab Results  Component Value Date   CREATININE 0.91 12/20/2020   BUN 12 12/20/2020   NA 139 12/20/2020   K 3.8 12/20/2020   CL 107 12/20/2020   CO2 21 12/20/2020   Lab Results  Component Value Date   ALT 16 03/08/2021   AST 16 03/08/2021   ALKPHOS 98 03/08/2021   BILITOT 0.4 03/08/2021   Lab Results  Component Value Date   HGBA1C 5.3 01/26/2021   HGBA1C 5.3 01/13/2021   HGBA1C 5.4 06/06/2020   HGBA1C 5.4 02/11/2020   HGBA1C 5.0 06/29/2019   Lab Results  Component Value Date   INSULIN 43.5 (H) 01/26/2021   INSULIN 35.7 (H) 06/06/2020   INSULIN 30.4 (H) 02/16/2020   INSULIN 18.2 06/29/2019   INSULIN 21.1 11/12/2018   Lab Results  Component Value Date   TSH 2.760 02/16/2020   Lab Results  Component Value Date   CHOL 164 10/27/2020   HDL 44 (L) 10/27/2020   LDLCALC 95 10/27/2020   TRIG 149 10/27/2020   CHOLHDL 3.7 10/27/2020   Lab Results  Component Value Date   WBC 4.9 01/13/2021   HGB 12.8 01/13/2021   HCT 38.9 01/13/2021   MCV 95 01/13/2021   PLT 267 01/13/2021   Lab Results  Component Value Date   IRON 60 01/13/2021   Attestation Statements:   Reviewed by clinician on day of visit: allergies, medications, problem list, medical history, surgical history, family history, social history, and previous encounter  notes.  I, Water quality scientist, CMA, am acting as Location manager for [employer], [place].  I have reviewed the above documentation for accuracy and completeness, and I agree with the above. -  Georgianne Fick, FNP

## 2021-06-14 ENCOUNTER — Encounter: Payer: BC Managed Care – PPO | Admitting: *Deleted

## 2021-06-21 ENCOUNTER — Encounter (INDEPENDENT_AMBULATORY_CARE_PROVIDER_SITE_OTHER): Payer: Self-pay | Admitting: *Deleted

## 2021-06-21 ENCOUNTER — Other Ambulatory Visit: Payer: Self-pay

## 2021-06-21 VITALS — BP 122/87 | HR 73 | Temp 97.7°F | Wt 259.2 lb

## 2021-06-21 DIAGNOSIS — Z006 Encounter for examination for normal comparison and control in clinical research program: Secondary | ICD-10-CM

## 2021-06-21 NOTE — Research (Signed)
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    She switched from Ozempic to [facility] for weight management. States that she has not really seen much change with her weight. She was also seen in April for achilles tendonitis to her (L) foot and was treated with a prednisone taper. States improvement in (L) foot discomfort. No other complaints verbalized. Verbalized excellent adherence with her Biktarvy. She will return in October for her next study visit and is scheduled to see Dr. Megan Salon in November.

## 2021-06-22 LAB — CBC AND DIFFERENTIAL
HCT: 41 (ref 36–46)
Hemoglobin: 13.7 (ref 12.0–16.0)
Neutrophils Absolute: 2.9
Platelets: 245 (ref 150–399)
WBC: 4.9

## 2021-06-22 LAB — CREATININE, SERUM: Creat: 1.04 mg/dL (ref 0.50–1.05)

## 2021-06-22 LAB — HEPATITIS C ANTIBODY
Hepatitis C Ab: NONREACTIVE
SIGNAL TO CUT-OFF: 0.01 (ref <1.00)

## 2021-06-22 LAB — CBC: RBC: 4.31 (ref 3.87–5.11)

## 2021-06-29 LAB — CD4/CD8 (T-HELPER/T-SUPPRESSOR CELL)
CD4 T Cell Abs: 620
CD4%: 41.3
CD8 % Suppressor T Cell: 42.9
CD8 T Cell Abs: 644

## 2021-07-04 LAB — HIV-1 RNA QUANT-NO REFLEX-BLD
Copies/mL: <40
Log copies/ml (ULTRA): <1.6

## 2021-07-06 ENCOUNTER — Other Ambulatory Visit: Payer: Self-pay

## 2021-07-06 ENCOUNTER — Ambulatory Visit (INDEPENDENT_AMBULATORY_CARE_PROVIDER_SITE_OTHER): Payer: BC Managed Care – PPO | Admitting: Family Medicine

## 2021-07-06 ENCOUNTER — Encounter (INDEPENDENT_AMBULATORY_CARE_PROVIDER_SITE_OTHER): Payer: Self-pay | Admitting: Family Medicine

## 2021-07-06 VITALS — BP 116/74 | HR 66 | Temp 98.2°F | Ht 63.0 in | Wt 252.0 lb

## 2021-07-06 DIAGNOSIS — Z9189 Other specified personal risk factors, not elsewhere classified: Secondary | ICD-10-CM

## 2021-07-06 DIAGNOSIS — E559 Vitamin D deficiency, unspecified: Secondary | ICD-10-CM | POA: Diagnosis not present

## 2021-07-06 DIAGNOSIS — Z6841 Body Mass Index (BMI) 40.0 and over, adult: Secondary | ICD-10-CM

## 2021-07-06 DIAGNOSIS — E7849 Other hyperlipidemia: Secondary | ICD-10-CM

## 2021-07-06 DIAGNOSIS — E8881 Metabolic syndrome: Secondary | ICD-10-CM

## 2021-07-06 DIAGNOSIS — E88819 Insulin resistance, unspecified: Secondary | ICD-10-CM

## 2021-07-06 DIAGNOSIS — E66813 Obesity, class 3: Secondary | ICD-10-CM

## 2021-07-06 MED ORDER — VITAMIN D (ERGOCALCIFEROL) 1.25 MG (50000 UNIT) PO CAPS: 50000.0000 [IU] | ORAL_CAPSULE | ORAL | 0 refills | Status: DC

## 2021-07-06 MED ORDER — WEGOVY 2.4 MG/0.75ML ~~LOC~~ SOAJ: 2.4000 mg | SUBCUTANEOUS | 0 refills | Status: DC

## 2021-07-07 LAB — LIPID PANEL WITH LDL/HDL RATIO
Cholesterol, Total: 163 mg/dL (ref 100–199)
HDL: 42 mg/dL (ref >39)
LDL Chol Calc (NIH): 104 mg/dL — ABNORMAL HIGH (ref 0–99)
LDL/HDL Ratio: 2.5 ratio (ref 0.0–3.2)
Triglycerides: 92 mg/dL (ref 0–149)
VLDL Cholesterol Cal: 17 mg/dL (ref 5–40)

## 2021-07-07 LAB — COMPREHENSIVE METABOLIC PANEL
ALT: 22 IU/L (ref 0–32)
AST: 21 IU/L (ref 0–40)
Albumin/Globulin Ratio: 2.2 (ref 1.2–2.2)
Albumin: 4.4 g/dL (ref 3.8–4.9)
Alkaline Phosphatase: 114 IU/L (ref 44–121)
BUN/Creatinine Ratio: 11 (ref 9–23)
BUN: 12 mg/dL (ref 6–24)
Bilirubin Total: 0.3 mg/dL (ref 0.0–1.2)
CO2: 24 mmol/L (ref 20–29)
Calcium: 9.4 mg/dL (ref 8.7–10.2)
Chloride: 103 mmol/L (ref 96–106)
Creatinine, Ser: 1.14 mg/dL — ABNORMAL HIGH (ref 0.57–1.00)
Globulin, Total: 2 g/dL (ref 1.5–4.5)
Glucose: 89 mg/dL (ref 65–99)
Potassium: 4.5 mmol/L (ref 3.5–5.2)
Sodium: 142 mmol/L (ref 134–144)
Total Protein: 6.4 g/dL (ref 6.0–8.5)
eGFR: 58 mL/min/{1.73_m2} — ABNORMAL LOW (ref >59)

## 2021-07-07 LAB — VITAMIN D 25 HYDROXY (VIT D DEFICIENCY, FRACTURES): Vit D, 25-Hydroxy: 49.7 ng/mL (ref 30.0–100.0)

## 2021-07-07 LAB — HEMOGLOBIN A1C
Est. average glucose Bld gHb Est-mCnc: 103 mg/dL
Hgb A1c MFr Bld: 5.2 % (ref 4.8–5.6)

## 2021-07-07 LAB — INSULIN, RANDOM: INSULIN: 44.2 u[IU]/mL — ABNORMAL HIGH (ref 2.6–24.9)

## 2021-07-11 ENCOUNTER — Other Ambulatory Visit (INDEPENDENT_AMBULATORY_CARE_PROVIDER_SITE_OTHER): Payer: Self-pay | Admitting: Family Medicine

## 2021-07-11 DIAGNOSIS — B20 Human immunodeficiency virus [HIV] disease: Secondary | ICD-10-CM

## 2021-07-12 NOTE — Progress Notes (Signed)
Chief Complaint:   OBESITY Margaret Porter is here to discuss her progress with her obesity treatment plan along with follow-up of her obesity related diagnoses. Margaret Porter is on the Category 3 Plan and states she is following her eating plan approximately 75% of the time. Margaret Porter states she is walking for 45 minutes 4 times per week.  Today's visit was #: 77 Starting weight: 262 lbs Starting date: 11/12/2018 Today's weight: 252 lbs Today's date: 07/06/2021 Total lbs lost to date: 10 Total lbs lost since last in-office visit: 3  Interim History: Margaret Porter went on a trip to [place] but she is still down 3 lbs. She says she has been walking with her friend. She is still skipping breakfast. She is on Wegovy 2.4 mg weekly, and she denies nausea or constipation.  Subjective:   1. Vitamin D deficiency Margaret Porter's last Vit D level was at goal at 56.2. She is on weekly prescription Vit D. She has been off her prescription Vit D because she accidently threw it away. She is taking OTC Vit D instead.  Lab Results  Component Value Date   VD25OH 49.7 07/06/2021   VD25OH 56.2 01/26/2021   VD25OH 42.5 06/06/2020   2. Other hyperlipidemia Margaret Porter has a history of elevated LDL (141 one year ago). Last LDL was within normal limits at 95. Her last HDL was low at 44 and triglycerides were within normal limits.  Lab Results  Component Value Date   ALT 22 07/06/2021   AST 21 07/06/2021   ALKPHOS 114 07/06/2021   BILITOT 0.3 07/06/2021   Lab Results  Component Value Date   CHOL 163 07/06/2021   HDL 42 07/06/2021   LDLCALC 104 (H) 07/06/2021   TRIG 92 07/06/2021   CHOLHDL 3.7 10/27/2020  The 10-year ASCVD risk score [name] DC Jr., et al., 2013) is: 1.9%   Values used to calculate the score:     Age: 2 years     Sex: Female     Is Non-Hispanic African American: Yes     Diabetic: No     Tobacco smoker: No     Systolic Blood Pressure: 967 mmHg     Is BP treated: No     HDL Cholesterol: 42 mg/dL      Total Cholesterol: 163 mg/dL  3. Insulin resistance Margaret Porter is on Wegovy, and her last A1c was 5.3. Her last fasting insulin was 43.5. She continues to work on diet and exercise to decrease her risk of diabetes.  Lab Results  Component Value Date   INSULIN 44.2 (H) 07/06/2021   INSULIN 43.5 (H) 01/26/2021   INSULIN 35.7 (H) 06/06/2020   INSULIN 30.4 (H) 02/16/2020   INSULIN 18.2 06/29/2019   Lab Results  Component Value Date   HGBA1C 5.2 07/06/2021   4. At risk for malnutrition Margaret Porter is at increased risk for malnutrition due to insufficiant protein intake.  Assessment/Plan:   1. Vitamin D deficiency We will check labs today, and we will refill prescription Vitamin D for 90 days with no refills. If the pharmacy will not refill the prescription, then she is to take OTC Vit D 6,000 IU daily. Margaret Porter will follow-up for routine testing of Vitamin D, at least 2-3 times per year to avoid over-replacement.  - VITAMIN D 25 Hydroxy (Vit-D Deficiency, Fractures) - Vitamin D, Ergocalciferol, (DRISDOL) 1.25 MG (50000 UNIT) CAPS capsule; Take 1 capsule (50,000 Units total) by mouth every 7 (seven) days.  Dispense: 12 capsule; Refill: 0  2.  Other hyperlipidemia  We will check labs today.   - Lipid Panel With LDL/HDL Ratio  3. Insulin resistance Margaret Porter will continue Wegovy, and will continue to work on weight loss, exercise, and decreasing simple carbohydrates to help decrease the risk of diabetes. We will check labs today. Margaret Porter agreed to follow-up with [name] as directed to closely monitor her progress.  - Comprehensive metabolic panel - Hemoglobin A1c - Insulin, random  4. At risk for malnutrition Margaret Porter was given approximately 15 minutes of counseling today regarding prevention of malnutrition and ways to meet macronutrient goals..   5. Obesity, current BMI 44.65 Margaret Porter is currently in the action stage of change. As such, her goal is to continue with weight loss efforts.  She has agreed to the Category 3 Plan.   We discussed various medication options to help Margaret Porter with her weight loss efforts and we both agreed to continue Wegovy 2.4 mg, and we will refill for 1 month.  - Semaglutide-Weight Management (WEGOVY) 2.4 MG/0.75ML SOAJ; Inject 2.4 mg into the skin once a week.  Dispense: 3 mL; Refill: 0  Exercise goals: As is.  Behavioral modification strategies: increasing lean protein intake, decreasing eating out, and no skipping meals.  Margaret Porter has agreed to follow-up with our clinic in 3 weeks.  Margaret Porter was informed we would discuss her lab results at her next visit unless there is a critical issue that needs to be addressed sooner. Margaret Porter agreed to keep her next visit at the agreed upon time to discuss these results.  Objective:   Blood pressure 116/74, pulse 66, temperature 98.2 F (36.8 C), height 5\' 3"  (1.6 m), weight 252 lb (114.3 kg), SpO2 97 %. Body mass index is 44.64 kg/m.  General: Cooperative, alert, well developed, in no acute distress. HEENT: Conjunctivae and lids unremarkable. Cardiovascular: Regular rhythm.  Lungs: Normal work of breathing. Neurologic: No focal deficits.   Lab Results  Component Value Date   CREATININE 1.14 (H) 07/06/2021   BUN 12 07/06/2021   NA 142 07/06/2021   K 4.5 07/06/2021   CL 103 07/06/2021   CO2 24 07/06/2021   Lab Results  Component Value Date   ALT 22 07/06/2021   AST 21 07/06/2021   ALKPHOS 114 07/06/2021   BILITOT 0.3 07/06/2021   Lab Results  Component Value Date   HGBA1C 5.2 07/06/2021   HGBA1C 5.3 01/26/2021   HGBA1C 5.3 01/13/2021   HGBA1C 5.4 06/06/2020   HGBA1C 5.4 02/11/2020   Lab Results  Component Value Date   INSULIN 44.2 (H) 07/06/2021   INSULIN 43.5 (H) 01/26/2021   INSULIN 35.7 (H) 06/06/2020   INSULIN 30.4 (H) 02/16/2020   INSULIN 18.2 06/29/2019   Lab Results  Component Value Date   TSH 2.760 02/16/2020   Lab Results  Component Value Date   CHOL 163  07/06/2021   HDL 42 07/06/2021   LDLCALC 104 (H) 07/06/2021   TRIG 92 07/06/2021   CHOLHDL 3.7 10/27/2020   Lab Results  Component Value Date   VD25OH 49.7 07/06/2021   VD25OH 56.2 01/26/2021   VD25OH 42.5 06/06/2020   Lab Results  Component Value Date   WBC 4.9 06/22/2021   HGB 13.7 06/22/2021   HCT 41 06/22/2021   MCV 95 01/13/2021   PLT 245 06/22/2021   Lab Results  Component Value Date   IRON 60 01/13/2021   Attestation Statements:   Reviewed by clinician on day of visit: allergies, medications, problem list, medical history, surgical history, family history,  social history, and previous encounter notes.   Wilhemena Durie, am acting as Location manager for [employer], FNP-C.  I have reviewed the above documentation for accuracy and completeness, and I agree with the above. -  Georgianne Fick, FNP

## 2021-07-13 ENCOUNTER — Encounter (INDEPENDENT_AMBULATORY_CARE_PROVIDER_SITE_OTHER): Payer: Self-pay | Admitting: Family Medicine

## 2021-07-27 ENCOUNTER — Encounter (INDEPENDENT_AMBULATORY_CARE_PROVIDER_SITE_OTHER): Payer: Self-pay | Admitting: Family Medicine

## 2021-07-27 ENCOUNTER — Ambulatory Visit (INDEPENDENT_AMBULATORY_CARE_PROVIDER_SITE_OTHER): Payer: BC Managed Care – PPO | Admitting: Family Medicine

## 2021-07-27 ENCOUNTER — Other Ambulatory Visit: Payer: Self-pay

## 2021-07-27 VITALS — BP 121/78 | HR 77 | Temp 97.9°F | Ht 63.0 in | Wt 256.0 lb

## 2021-07-27 DIAGNOSIS — E88819 Insulin resistance, unspecified: Secondary | ICD-10-CM

## 2021-07-27 DIAGNOSIS — Z6841 Body Mass Index (BMI) 40.0 and over, adult: Secondary | ICD-10-CM | POA: Diagnosis not present

## 2021-07-27 DIAGNOSIS — E8881 Metabolic syndrome: Secondary | ICD-10-CM | POA: Diagnosis not present

## 2021-07-27 DIAGNOSIS — E7849 Other hyperlipidemia: Secondary | ICD-10-CM

## 2021-07-27 DIAGNOSIS — Z9189 Other specified personal risk factors, not elsewhere classified: Secondary | ICD-10-CM

## 2021-07-27 MED ORDER — WEGOVY 2.4 MG/0.75ML ~~LOC~~ SOAJ
2.4000 mg | SUBCUTANEOUS | 0 refills | Status: DC
Start: 2021-07-27 — End: 2021-08-17

## 2021-07-27 NOTE — Progress Notes (Signed)
The 10-year ASCVD risk score [name] DC Brooke Bonito., et al., 2013) is: 2.2%   Values used to calculate the score:     Age: 51 years     Sex: Female     Is Non-Hispanic African American: Yes     Diabetic: No     Tobacco smoker: No     Systolic Blood Pressure: 123XX123 mmHg     Is BP treated: No     HDL Cholesterol: 42 mg/dL     Total Cholesterol: 163 mg/dL

## 2021-07-31 NOTE — Progress Notes (Signed)
Chief Complaint:   OBESITY Margaret Porter is here to discuss her progress with her obesity treatment plan along with follow-up of her obesity related diagnoses. Margaret Porter is on the Category 3 Plan and states she is following her eating plan approximately 60% of the time. Margaret Porter states she is walking 45 minutes 5 times per week.  Today's visit was #: 42 Starting weight: 262 lbs Starting date: 11/12/2018 Today's weight: 256 lbs Today's date: 07/27/2021 Total lbs lost to date: 6 Total lbs lost since last in-office visit: +4  Interim History: Margaret Porter is going through some family stress and has not been eating what she should. She is trying to make sure she does not skip breakfast. 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 She is working on walking regularly.  Subjective:   1. Insulin resistance Margaret Porter still notes getting full quickly with [facility]. Her last fasting insulin was 44.2.   Lab Results  Component Value Date   INSULIN 44.2 (H) 07/06/2021   INSULIN 43.5 (H) 01/26/2021   INSULIN 35.7 (H) 06/06/2020   INSULIN 30.4 (H) 02/16/2020   INSULIN 18.2 06/29/2019   Lab Results  Component Value Date   HGBA1C 5.2 07/06/2021   2. Other hyperlipidemia Margaret Porter's LDL is 104, which is down from 141. Her HDL is low at 42 and triglycerides are within normal limits. She is not on statin therapy. She has no family history of CAD. ASCVD 10 year risk score is 2.2%.   Lab Results  Component Value Date   CHOL 163 07/06/2021   HDL 42 07/06/2021   LDLCALC 104 (H) 07/06/2021   TRIG 92 07/06/2021   CHOLHDL 3.7 10/27/2020   Lab Results  Component Value Date   ALT 22 07/06/2021   AST 21 07/06/2021   ALKPHOS 114 07/06/2021   BILITOT 0.3 07/06/2021   The 10-year ASCVD risk score Margaret Porter DC Jr., et al., 2013) is: 2.2%   Values used to calculate the score:     Age: 51 years     Sex: Female     Is Non-Hispanic African American: Yes     Diabetic: No     Tobacco smoker: No      Systolic Blood Pressure: 123XX123 mmHg     Is BP treated: No     HDL Cholesterol: 42 mg/dL     Total Cholesterol: 163 mg/dL  3. At risk for heart disease Margaret Porter is at a higher than average risk for cardiovascular disease due to obesity.   Assessment/Plan:   1. Insulin resistance Kemesha will continue to work on weight loss, exercise, and decreasing simple carbohydrates to help decrease the risk of diabetes. Margaret Porter agreed to follow-up with [name] as directed to closely monitor her progress.  Refill- Semaglutide-Weight Management (WEGOVY) 2.4 MG/0.75ML SOAJ; Inject 2.4 mg into the skin once a week.  Dispense: 3 mL; Refill: 0  2. Other hyperlipidemia No statin indicated. Cardiovascular risk and specific lipid/LDL goals reviewed.  We discussed several lifestyle modifications today and Margaret Porter will continue to work on diet, increase exercise and weight loss efforts. Orders and follow up as documented in patient record.   Counseling Intensive lifestyle modifications are the first line treatment for this issue. Dietary changes: Increase soluble fiber. Decrease simple carbohydrates. Exercise changes: Moderate to vigorous-intensity aerobic activity 150 minutes per week if tolerated. Lipid-lowering medications: see documented in medical record.  3. At risk for heart disease Margaret Porter was given approximately 15 minutes of coronary artery disease prevention  counseling today. She is 51 y.o. female and has risk factors for heart disease including obesity. We discussed intensive lifestyle modifications today with an emphasis on specific weight loss instructions and strategies.   Repetitive spaced learning was employed today to elicit superior memory formation and behavioral change.  4. Obesity, current BMI 45.36  Margaret Porter is currently in the action stage of change. As such, her goal is to continue with weight loss efforts. She has agreed to the Category 3 Plan.   Exercise goals:  As is  Behavioral  modification strategies: increasing lean protein intake and no skipping meals.  Margaret Porter has agreed to follow-up with our clinic in 3 weeks.  Objective:   Blood pressure 121/78, pulse 77, temperature 97.9 F (36.6 C), height '5\' 3"'$  (1.6 m), weight 256 lb (116.1 kg), SpO2 97 %. Body mass index is 45.35 kg/m.  General: Cooperative, alert, well developed, in no acute distress. HEENT: Conjunctivae and lids unremarkable. Cardiovascular: Regular rhythm.  Lungs: Normal work of breathing. Neurologic: No focal deficits.   Lab Results  Component Value Date   CREATININE 1.14 (H) 07/06/2021   BUN 12 07/06/2021   NA 142 07/06/2021   K 4.5 07/06/2021   CL 103 07/06/2021   CO2 24 07/06/2021   Lab Results  Component Value Date   ALT 22 07/06/2021   AST 21 07/06/2021   ALKPHOS 114 07/06/2021   BILITOT 0.3 07/06/2021   Lab Results  Component Value Date   HGBA1C 5.2 07/06/2021   HGBA1C 5.3 01/26/2021   HGBA1C 5.3 01/13/2021   HGBA1C 5.4 06/06/2020   HGBA1C 5.4 02/11/2020   Lab Results  Component Value Date   INSULIN 44.2 (H) 07/06/2021   INSULIN 43.5 (H) 01/26/2021   INSULIN 35.7 (H) 06/06/2020   INSULIN 30.4 (H) 02/16/2020   INSULIN 18.2 06/29/2019   Lab Results  Component Value Date   TSH 2.760 02/16/2020   Lab Results  Component Value Date   CHOL 163 07/06/2021   HDL 42 07/06/2021   LDLCALC 104 (H) 07/06/2021   TRIG 92 07/06/2021   CHOLHDL 3.7 10/27/2020   Lab Results  Component Value Date   VD25OH 49.7 07/06/2021   VD25OH 56.2 01/26/2021   VD25OH 42.5 06/06/2020   Lab Results  Component Value Date   WBC 4.9 06/22/2021   HGB 13.7 06/22/2021   HCT 41 06/22/2021   MCV 95 01/13/2021   PLT 245 06/22/2021   Lab Results  Component Value Date   IRON 60 01/13/2021    Attestation Statements:   Reviewed by clinician on day of visit: allergies, medications, problem list, medical history, surgical history, family history, social history, and previous encounter  notes.  Coral Ceo, CMA, am acting as Location manager for [employer], [place].  I have reviewed the above documentation for accuracy and completeness, and I agree with the above. -  Georgianne Fick, FNP

## 2021-08-10 ENCOUNTER — Telehealth: Payer: Self-pay | Admitting: Medical

## 2021-08-10 NOTE — Telephone Encounter (Signed)
Pt called and states that she is experiencing chest tightness, sweating and feels like her heart is racing. Pt was advised to go to ER. Sending to [place] as a FYI.

## 2021-08-17 ENCOUNTER — Encounter (INDEPENDENT_AMBULATORY_CARE_PROVIDER_SITE_OTHER): Payer: Self-pay | Admitting: Family Medicine

## 2021-08-17 ENCOUNTER — Other Ambulatory Visit: Payer: Self-pay

## 2021-08-17 ENCOUNTER — Ambulatory Visit (INDEPENDENT_AMBULATORY_CARE_PROVIDER_SITE_OTHER): Payer: BC Managed Care – PPO | Admitting: Family Medicine

## 2021-08-17 VITALS — BP 109/73 | HR 72 | Temp 98.0°F | Ht 63.0 in | Wt 254.0 lb

## 2021-08-17 DIAGNOSIS — E8881 Metabolic syndrome: Secondary | ICD-10-CM

## 2021-08-17 DIAGNOSIS — E559 Vitamin D deficiency, unspecified: Secondary | ICD-10-CM | POA: Diagnosis not present

## 2021-08-17 DIAGNOSIS — Z6841 Body Mass Index (BMI) 40.0 and over, adult: Secondary | ICD-10-CM | POA: Diagnosis not present

## 2021-08-17 DIAGNOSIS — E88819 Insulin resistance, unspecified: Secondary | ICD-10-CM

## 2021-08-17 DIAGNOSIS — Z9189 Other specified personal risk factors, not elsewhere classified: Secondary | ICD-10-CM

## 2021-08-17 MED ORDER — WEGOVY 2.4 MG/0.75ML ~~LOC~~ SOAJ: 2.4000 mg | SUBCUTANEOUS | 0 refills | Status: DC

## 2021-08-17 NOTE — Progress Notes (Signed)
Chief Complaint:   OBESITY Margaret Porter is here to discuss her progress with her obesity treatment plan along with follow-up of her obesity related diagnoses. Margaret Porter is on the Category 3 Plan and states she is following her eating plan approximately 85% of the time. Margaret Porter states she is walking for 40 minutes 4 times per week.  Today's visit was #: 67 Starting weight: 262 lbs Starting date: 11/12/2018 Today's weight: 254 lbs Today's date: 08/17/2021 Total lbs lost to date: 8 lbs Total lbs lost since last in-office visit: 4 lbs  Interim History: Margaret Porter started back to school a few weeks ago and notes she has been packing her lunch. She has been cooking and doing some meal prepping. She  has not been eating out as much. She has cut back on caloric beverages.  Subjective:   1. Insulin resistance Lylla's appetite is well controlled. She is on Wegovy 2.4 mg. She denies nausea and constipation.  Lab Results  Component Value Date   INSULIN 44.2 (H) 07/06/2021   INSULIN 43.5 (H) 01/26/2021   INSULIN 35.7 (H) 06/06/2020   INSULIN 30.4 (H) 02/16/2020   INSULIN 18.2 06/29/2019   Lab Results  Component Value Date   HGBA1C 5.2 07/06/2021    2. Vitamin D deficiency Margaret Porter's Vitamin D is almost at goal. She is on weekly prescription Vitamin D.  Lab Results  Component Value Date   VD25OH 49.7 07/06/2021   VD25OH 56.2 01/26/2021   VD25OH 42.5 06/06/2020    3. At risk for impaired metabolic function Margaret Porter is at risk for impaired metabolic function due to inadequate protein intake.  Assessment/Plan:   1. Insulin resistance Margaret Porter will continue to work on weight loss, exercise, and decreasing simple carbohydrates to help decrease the risk of diabetes. We will refill Wegovy 2.4 mg weekly. Margaret Porter agreed to follow-up with [name] as directed to closely monitor her progress.  - Semaglutide-Weight Management (WEGOVY) 2.4 MG/0.75ML SOAJ; Inject 2.4 mg into the skin once a  week.  Dispense: 3 mL; Refill: 0  2. Vitamin D deficiency . Margaret Porter agrees to continue to take prescription Vitamin D 50,000 IU every week and will follow-up for routine testing of Vitamin D, at least 2-3 times per year to avoid over-replacement.  3. At risk for impaired metabolic function Margaret Porter was given approximately 15 minutes of impaired  metabolic function prevention counseling today. We discussed intensive lifestyle modifications today with an emphasis on specific nutrition and exercise instructions and strategies.   Repetitive spaced learning was employed today to elicit superior memory formation and behavioral change.   4. Obesity, current BMI 45.36 Margaret Porter is currently in the action stage of change. As such, her goal is to continue with weight loss efforts. She has agreed to the Category 3 Plan.   Margaret Porter will work on having breakfast daily.  Exercise goals:  As is.  Behavioral modification strategies: increasing lean protein intake, decreasing simple carbohydrates, and no skipping meals.  Margaret Porter has agreed to follow-up with our clinic in 3-4 weeks.  Objective:   Blood pressure 109/73, pulse 72, temperature 98 F (36.7 C), height '5\' 3"'$  (1.6 m), weight 254 lb (115.2 kg), SpO2 97 %. Body mass index is 44.99 kg/m.  General: Cooperative, alert, well developed, in no acute distress. HEENT: Conjunctivae and lids unremarkable. Cardiovascular: Regular rhythm.  Lungs: Normal work of breathing. Neurologic: No focal deficits.   Lab Results  Component Value Date   CREATININE 1.14 (H) 07/06/2021   BUN 12 07/06/2021  NA 142 07/06/2021   K 4.5 07/06/2021   CL 103 07/06/2021   CO2 24 07/06/2021   Lab Results  Component Value Date   ALT 22 07/06/2021   AST 21 07/06/2021   ALKPHOS 114 07/06/2021   BILITOT 0.3 07/06/2021   Lab Results  Component Value Date   HGBA1C 5.2 07/06/2021   HGBA1C 5.3 01/26/2021   HGBA1C 5.3 01/13/2021   HGBA1C 5.4 06/06/2020   HGBA1C 5.4  02/11/2020   Lab Results  Component Value Date   INSULIN 44.2 (H) 07/06/2021   INSULIN 43.5 (H) 01/26/2021   INSULIN 35.7 (H) 06/06/2020   INSULIN 30.4 (H) 02/16/2020   INSULIN 18.2 06/29/2019   Lab Results  Component Value Date   TSH 2.760 02/16/2020   Lab Results  Component Value Date   CHOL 163 07/06/2021   HDL 42 07/06/2021   LDLCALC 104 (H) 07/06/2021   TRIG 92 07/06/2021   CHOLHDL 3.7 10/27/2020   Lab Results  Component Value Date   VD25OH 49.7 07/06/2021   VD25OH 56.2 01/26/2021   VD25OH 42.5 06/06/2020   Lab Results  Component Value Date   WBC 4.9 06/22/2021   HGB 13.7 06/22/2021   HCT 41 06/22/2021   MCV 95 01/13/2021   PLT 245 06/22/2021   Lab Results  Component Value Date   IRON 60 01/13/2021   Attestation Statements:   Reviewed by clinician on day of visit: allergies, medications, problem list, medical history, surgical history, family history, social history, and previous encounter notes.  I, Lizbeth Bark, RMA, am acting as Location manager for [employer], [place].   I have reviewed the above documentation for accuracy and completeness, and I agree with the above. -  Georgianne Fick, FNP

## 2021-09-18 ENCOUNTER — Ambulatory Visit (INDEPENDENT_AMBULATORY_CARE_PROVIDER_SITE_OTHER): Payer: BC Managed Care – PPO | Admitting: Family Medicine

## 2021-09-19 ENCOUNTER — Other Ambulatory Visit: Payer: Self-pay

## 2021-09-19 ENCOUNTER — Ambulatory Visit (INDEPENDENT_AMBULATORY_CARE_PROVIDER_SITE_OTHER): Payer: BC Managed Care – PPO | Admitting: Physician Assistant

## 2021-09-19 VITALS — BP 123/66 | HR 69 | Temp 97.9°F | Ht 63.0 in | Wt 251.0 lb

## 2021-09-19 DIAGNOSIS — E8881 Metabolic syndrome: Secondary | ICD-10-CM | POA: Diagnosis not present

## 2021-09-19 DIAGNOSIS — Z6841 Body Mass Index (BMI) 40.0 and over, adult: Secondary | ICD-10-CM

## 2021-09-19 DIAGNOSIS — Z9189 Other specified personal risk factors, not elsewhere classified: Secondary | ICD-10-CM | POA: Diagnosis not present

## 2021-09-19 MED ORDER — WEGOVY 2.4 MG/0.75ML ~~LOC~~ SOAJ: 2.4000 mg | SUBCUTANEOUS | 0 refills | Status: DC

## 2021-09-19 NOTE — Progress Notes (Signed)
Chief Complaint:   OBESITY Margaret Porter is here to discuss her progress with her obesity treatment plan along with follow-up of her obesity related diagnoses. Margaret Porter is on the Category 3 Plan and states she is following her eating plan approximately 80% of the time. Margaret Porter states she is walking for 40 minutes 5 times per week.  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 She has been skipping breakfast. She sometimes orders out for breakfast at [employer].   Subjective:   1. Insulin resistance Margaret Porter is currently not on medications. Her last insulin level was moderately elevated at 44.23. She is on [employer].   2. At risk for diabetes mellitus Margaret Porter is at higher than average risk for developing diabetes due to obesity.    Assessment/Plan:   1. Insulin resistance Margaret Porter will continue to work on weight loss, exercise, and decreasing simple carbohydrates to help decrease the risk of diabetes. Margaret Porter agreed to follow-up with [name] as directed to closely monitor her progress.  2. At risk for diabetes mellitus Margaret Porter was given approximately 15 minutes of diabetes education and counseling today. We discussed intensive lifestyle modifications today with an emphasis on weight loss as well as increasing exercise and decreasing simple carbohydrates in her diet. We also reviewed medication options with an emphasis on risk versus benefit of those discussed.   Repetitive spaced learning was employed today to elicit superior memory formation and behavioral change.   3. Obesity, current BMI 44.47 Margaret Porter is currently in the action stage of change. As such, her goal is to continue with weight loss efforts. She has agreed to the Category 3 Plan.   We will  refill Wegovy 2.4 mg for 1 month with no refills for Margaret Porter today.  - Semaglutide-Weight Management (WEGOVY) 2.4 MG/0.75ML SOAJ; Inject 2.4 mg into the skin once a week.  Dispense: 3 mL; Refill: 0  Exercise goals:  As is.  Behavioral modification strategies: meal planning and cooking strategies and keeping healthy foods in the home.  Margaret Porter has agreed to follow-up with our clinic in 3-4 weeks. She was informed of the importance of frequent follow-up visits to maximize her success with intensive lifestyle modifications for her multiple health conditions.   Objective:   Blood pressure 123/66, pulse 69, temperature 97.9 F (36.6 C), height 5\' 3"  (1.6 m), weight 251 lb (113.9 kg), SpO2 96 %. Body mass index is 44.46 kg/m.  General: Cooperative, alert, well developed, in no acute distress. HEENT: Conjunctivae and lids unremarkable. Cardiovascular: Regular rhythm.  Lungs: Normal work of breathing. Neurologic: No focal deficits.   Lab Results  Component Value Date   CREATININE 1.14 (H) 07/06/2021   BUN 12 07/06/2021   NA 142 07/06/2021   K 4.5 07/06/2021   CL 103 07/06/2021   CO2 24 07/06/2021   Lab Results  Component Value Date   ALT 22 07/06/2021   AST 21 07/06/2021   ALKPHOS 114 07/06/2021   BILITOT 0.3 07/06/2021   Lab Results  Component Value Date   HGBA1C 5.2 07/06/2021   HGBA1C 5.3 01/26/2021   HGBA1C 5.3 01/13/2021   HGBA1C 5.4 06/06/2020   HGBA1C 5.4 02/11/2020   Lab Results  Component Value Date   INSULIN 44.2 (H) 07/06/2021   INSULIN 43.5 (H) 01/26/2021  INSULIN 35.7 (H) 06/06/2020   INSULIN 30.4 (H) 02/16/2020   INSULIN 18.2 06/29/2019   Lab Results  Component Value Date   TSH 2.760 02/16/2020   Lab Results  Component Value Date   CHOL 163 07/06/2021   HDL 42 07/06/2021   LDLCALC 104 (H) 07/06/2021   TRIG 92 07/06/2021   CHOLHDL 3.7 10/27/2020   Lab Results  Component Value Date   VD25OH 49.7 07/06/2021   VD25OH 56.2 01/26/2021   VD25OH  42.5 06/06/2020   Lab Results  Component Value Date   WBC 4.9 06/22/2021   HGB 13.7 06/22/2021   HCT 41 06/22/2021   MCV 95 01/13/2021   PLT 245 06/22/2021   Lab Results  Component Value Date   IRON 60 01/13/2021   Attestation Statements:   Reviewed by clinician on day of visit: allergies, medications, problem list, medical history, surgical history, family history, social history, and previous encounter notes.  I, Tonye Pearson, am acting as Location manager for [employer], PA-C.   I have reviewed the above documentation for accuracy and completeness, and I agree with the above. Abby Potash, PA-C

## 2021-10-10 ENCOUNTER — Encounter (INDEPENDENT_AMBULATORY_CARE_PROVIDER_SITE_OTHER): Payer: Self-pay | Admitting: Family Medicine

## 2021-10-10 ENCOUNTER — Ambulatory Visit (INDEPENDENT_AMBULATORY_CARE_PROVIDER_SITE_OTHER): Payer: BC Managed Care – PPO | Admitting: Family Medicine

## 2021-10-10 ENCOUNTER — Other Ambulatory Visit: Payer: Self-pay

## 2021-10-10 VITALS — BP 106/73 | HR 71 | Temp 98.0°F | Ht 63.0 in | Wt 250.0 lb

## 2021-10-10 DIAGNOSIS — Z6841 Body Mass Index (BMI) 40.0 and over, adult: Secondary | ICD-10-CM

## 2021-10-10 DIAGNOSIS — E8881 Metabolic syndrome: Secondary | ICD-10-CM | POA: Diagnosis not present

## 2021-10-10 DIAGNOSIS — E88819 Insulin resistance, unspecified: Secondary | ICD-10-CM

## 2021-10-10 NOTE — Progress Notes (Signed)
Chief Complaint:   OBESITY Margaret Porter is here to discuss her progress with her obesity treatment plan along with follow-up of her obesity related diagnoses. Margaret Porter is on the Category 3 Plan and states she is following her eating plan approximately 80% of the time. Margaret Porter states she is walking at work for 40 minutes 5 times per week.  Today's visit was #: 45 Starting weight: 262 lbs Starting date: 11/12/2018 Today's weight: 250 lbs Today's date: 10/10/2021 Total lbs lost to date: 12 lbs Total lbs lost since last in-office visit: 1 lb  Interim History: Margaret Porter is trying to cook more and pack her lunch. She skips breakfast  about 5 days per week. She sometimes gets a biscuit for breakfast. She has sweet tea about once per week (or lemonade). 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  Subjective:   1. Insulin resistance Margaret Porter is on the [facility] and notes good appetite suppression. She denies nausea or constipation.  Lab Results  Component Value Date   INSULIN 44.2 (H) 07/06/2021   INSULIN 43.5 (H) 01/26/2021   INSULIN 35.7 (H) 06/06/2020   INSULIN 30.4 (H) 02/16/2020   INSULIN 18.2 06/29/2019   Lab Results  Component Value Date   HGBA1C 5.2 07/06/2021    Assessment/Plan:   1. Insulin resistance Margaret Porter will continue Wegovy at 2.4 mg weekly.   2. Obesity, current BMI 44.3 Margaret Porter is currently in the action stage of change. As such, her goal is to continue with weight loss efforts. She has agreed to the Category 3 Plan.   Exercise goals:  Margaret Porter was encouraged to do some cardio on the weekends.  Behavioral modification strategies: no skipping meals and meal planning and cooking strategies.  Margaret Porter has agreed to follow-up with our clinic in 3 weeks.  Objective:   Blood pressure 106/73, pulse 71, temperature 98 F (36.7 C), height 5\' 3"  (1.6 m), weight 250 lb (113.4 kg), SpO2 97 %. Body mass index is 44.29  kg/m.  General: Cooperative, alert, well developed, in no acute distress. HEENT: Conjunctivae and lids unremarkable. Cardiovascular: Regular rhythm.  Lungs: Normal work of breathing. Neurologic: No focal deficits.   Lab Results  Component Value Date   CREATININE 1.14 (H) 07/06/2021   BUN 12 07/06/2021   NA 142 07/06/2021   K 4.5 07/06/2021   CL 103 07/06/2021   CO2 24 07/06/2021   Lab Results  Component Value Date   ALT 22 07/06/2021   AST 21 07/06/2021   ALKPHOS 114 07/06/2021   BILITOT 0.3 07/06/2021   Lab Results  Component Value Date   HGBA1C 5.2 07/06/2021   HGBA1C 5.3 01/26/2021   HGBA1C 5.3 01/13/2021   HGBA1C 5.4 06/06/2020   HGBA1C 5.4 02/11/2020   Lab Results  Component Value Date   INSULIN 44.2 (H) 07/06/2021   INSULIN 43.5 (H) 01/26/2021   INSULIN 35.7 (H) 06/06/2020   INSULIN 30.4 (H) 02/16/2020   INSULIN 18.2 06/29/2019   Lab Results  Component Value Date   TSH 2.760 02/16/2020   Lab Results  Component Value Date   CHOL 163 07/06/2021   HDL 42 07/06/2021   LDLCALC 104 (H) 07/06/2021   TRIG 92 07/06/2021   CHOLHDL 3.7 10/27/2020   Lab Results  Component Value Date   VD25OH 49.7 07/06/2021   VD25OH 56.2 01/26/2021   VD25OH 42.5 06/06/2020   Lab Results  Component Value Date   WBC 4.9 06/22/2021   HGB 13.7 06/22/2021  HCT 41 06/22/2021   MCV 95 01/13/2021   PLT 245 06/22/2021   Lab Results  Component Value Date   IRON 60 01/13/2021   Attestation Statements:   Reviewed by clinician on day of visit: allergies, medications, problem list, medical history, surgical history, family history, social history, and previous encounter notes.  I, Lizbeth Bark, RMA, am acting as Location manager for [employer], [place].    I have reviewed the above documentation for accuracy and completeness, and I agree with the above. -  Georgianne Fick, FNP

## 2021-10-11 ENCOUNTER — Telehealth (INDEPENDENT_AMBULATORY_CARE_PROVIDER_SITE_OTHER): Payer: BC Managed Care – PPO | Admitting: Medical

## 2021-10-11 ENCOUNTER — Encounter: Payer: Self-pay | Admitting: Medical

## 2021-10-11 VITALS — BP 124/84 | Wt 255.0 lb

## 2021-10-11 DIAGNOSIS — J029 Acute pharyngitis, unspecified: Secondary | ICD-10-CM | POA: Diagnosis not present

## 2021-10-11 DIAGNOSIS — Z20818 Contact with and (suspected) exposure to other bacterial communicable diseases: Secondary | ICD-10-CM | POA: Diagnosis not present

## 2021-10-11 LAB — RESULTS CONSOLE HPV: CHL HPV: NEGATIVE

## 2021-10-11 LAB — HM PAP SMEAR: HM Pap smear: ABNORMAL

## 2021-10-11 MED ORDER — AZITHROMYCIN 250 MG PO TABS
ORAL_TABLET | ORAL | 0 refills | Status: DC
Start: 2021-10-11 — End: 2022-01-22

## 2021-10-11 NOTE — Progress Notes (Signed)
Subjective:     Patient ID: Margaret Porter, female   DOB: [birth year], 51 y.o.   MRN: [id number]  This visit type was conducted due to national recommendations for restrictions regarding the COVID-19 Pandemic (e.g. social distancing) in an effort to limit this patient's exposure and mitigate transmission in our community.  Due to their co-morbid illnesses, this patient is at least at moderate risk for complications without adequate follow up.  This format is felt to be most appropriate for this patient at this time.    Documentation for virtual audio and video telecommunications through [place] encounter:  The patient was located at home. The provider was located in the office. The patient did consent to this visit and is aware of possible charges through their insurance for this visit.  The other persons participating in this telemedicine service were none. Time spent on call was 20 minutes and in review of previous records 20 minutes total.  This virtual service is not related to other E/M service within previous 7 days.   HPI Chief Complaint  Patient presents with   Sore Throat    Brother tested positive for strep and was around him over the weekend. Sore throat, no other symptoms   Virtual consult for sore throat.  Started in the last 24 hours.  She notes sore throat, not feeling great but no other symptoms.  She was around her brother and mother this weekend.  They found out yesterday her brother tested positive for strep.  They were in close proximity and now she and her mother are sick as well.  No cough, no fever, no major headache, no congestion or drainage or sneezing.  No other aggravating or relieving factors. No other complaint.  Past Medical History:  Diagnosis Date   Allergic rhinitis    Allergy    Food allergy    HIV infection ([place]) 2006   Idiopathic urticaria    Joint pain    Lytic bone lesions on xray    bone lesions on hip,sternum and spine   Obesity     Pre-diabetes    pt not on meds   Rosacea    Sickle cell anemia (HCC)    Sickle cell trait with mom   Swelling of both lower extremities    per pt, left side is more swollen   Vitamin D deficiency    Current Outpatient Medications on File Prior to Visit  Medication Sig Dispense Refill   bictegravir-emtricitabine-tenofovir AF (BIKTARVY) 50-200-25 MG TABS tablet Take 1 tablet by mouth daily. 30 tablet 11   diphenhydrAMINE (BENADRYL) 25 MG tablet Take 25 mg by mouth every 6 (six) hours as needed for itching or allergies. Reported on 02/29/2016     hyoscyamine (LEVSIN SL) 0.125 MG SL tablet Place 1 tablet (0.125 mg total) under the tongue 3 (three) times daily as needed. 30 tablet 0   Semaglutide-Weight Management (WEGOVY) 2.4 MG/0.75ML SOAJ Inject 2.4 mg into the skin once a week. 3 mL 0   Vitamin D, Ergocalciferol, (DRISDOL) 1.25 MG (50000 UNIT) CAPS capsule Take 1 capsule (50,000 Units total) by mouth every 7 (seven) days. 12 capsule 0   No current facility-administered medications on file prior to visit.     Review of Systems As in subjective    Objective:   Physical Exam Due to coronavirus pandemic stay at home measures, patient visit was virtual and they were not examined in person.   BP 124/84   Wt 255 lb (115.7 kg)  BMI 45.17 kg/m    General: Well-developed well-nourished no acute distress      Assessment:     Encounter Diagnoses  Name Primary?   Sore throat Yes   Streptococcus exposure        Plan:     Given close proximity to strep exposure, begin zpak.   Discussed symptoms, diagnosis, and possible complications including peritonsillar abscess formation.  Advised that they will be infectious for 24 hours after starting antibiotics.  Discussed means of prevention, precautions.  Supportive care recommended including OTC analgesics, salt water gargles, warm fluids, good hydration, and rest.  Discussed signs or symptoms that would prompt immediate evaluation.   Call  or return if worse or not improving in the next 2-3 days.  Patient voiced understanding of diagnosis, recommendations, and treatment plan.   Margaret Porter was seen today for sore throat.  Diagnoses and all orders for this visit:  Sore throat  Streptococcus exposure  Other orders -     azithromycin (ZITHROMAX) 250 MG tablet; 2 tablets day 1, then 1 tablet days 2-4  F/u prn

## 2021-10-12 ENCOUNTER — Encounter: Payer: Self-pay | Admitting: Medical

## 2021-10-12 NOTE — Progress Notes (Signed)
Letter typed and sent.

## 2021-10-30 ENCOUNTER — Other Ambulatory Visit: Payer: BC Managed Care – PPO

## 2021-10-30 ENCOUNTER — Encounter (INDEPENDENT_AMBULATORY_CARE_PROVIDER_SITE_OTHER): Payer: BC Managed Care – PPO | Admitting: *Deleted

## 2021-10-30 ENCOUNTER — Other Ambulatory Visit: Payer: Self-pay

## 2021-10-30 VITALS — BP 122/83 | HR 87 | Temp 98.1°F | Wt 250.0 lb

## 2021-10-30 DIAGNOSIS — Z006 Encounter for examination for normal comparison and control in clinical research program: Secondary | ICD-10-CM

## 2021-10-30 DIAGNOSIS — B20 Human immunodeficiency virus [HIV] disease: Secondary | ICD-10-CM

## 2021-10-30 NOTE — Research (Signed)
Margaret Porter was here for her wk 456 visit for [id number]. She denies any current problems. She did have a sore throat a few weeks ago and was given a zpak to treat in case she had strep throat but was never tested. She will return for study in June.

## 2021-10-31 LAB — LIPID PANEL
Cholesterol: 135 mg/dL (ref <200)
HDL: 41 mg/dL — ABNORMAL LOW (ref >=50)
LDL Cholesterol (Calc): 77 mg/dL (calc)
Non-HDL Cholesterol (Calc): 94 mg/dL (calc) (ref <130)
Total CHOL/HDL Ratio: 3.3 (calc) (ref <5.0)
Triglycerides: 83 mg/dL (ref <150)

## 2021-10-31 LAB — CBC
HCT: 45 % (ref 35.0–45.0)
Hemoglobin: 15.2 g/dL (ref 11.7–15.5)
MCH: 31.7 pg (ref 27.0–33.0)
MCHC: 33.8 g/dL (ref 32.0–36.0)
MCV: 93.9 fL (ref 80.0–100.0)
MPV: 11.2 fL (ref 7.5–12.5)
Platelets: 256 10*3/uL (ref 140–400)
RBC: 4.79 10*6/uL (ref 3.80–5.10)
RDW: 12.7 % (ref 11.0–15.0)
WBC: 5.8 10*3/uL (ref 3.8–10.8)

## 2021-10-31 LAB — RPR: RPR Ser Ql: NONREACTIVE

## 2021-10-31 LAB — CREATININE, SERUM: Creat: 1.13 mg/dL — ABNORMAL HIGH (ref 0.50–1.03)

## 2021-10-31 LAB — T-HELPER CELL (CD4) - (RCID CLINIC ONLY)
CD4 % Helper T Cell: 45 % (ref 33–65)
CD4 T Cell Abs: 673 /uL (ref 400–1790)

## 2021-11-01 ENCOUNTER — Other Ambulatory Visit: Payer: Self-pay | Admitting: Internal Medicine

## 2021-11-01 DIAGNOSIS — B20 Human immunodeficiency virus [HIV] disease: Secondary | ICD-10-CM

## 2021-11-01 NOTE — Telephone Encounter (Signed)
Patient has appt 11/16

## 2021-11-06 ENCOUNTER — Other Ambulatory Visit: Payer: Self-pay

## 2021-11-06 ENCOUNTER — Encounter (INDEPENDENT_AMBULATORY_CARE_PROVIDER_SITE_OTHER): Payer: Self-pay | Admitting: Family Medicine

## 2021-11-06 ENCOUNTER — Ambulatory Visit (INDEPENDENT_AMBULATORY_CARE_PROVIDER_SITE_OTHER): Payer: BC Managed Care – PPO | Admitting: Family Medicine

## 2021-11-06 VITALS — BP 115/77 | HR 88 | Temp 97.6°F | Ht 63.0 in | Wt 246.0 lb

## 2021-11-06 DIAGNOSIS — Z9189 Other specified personal risk factors, not elsewhere classified: Secondary | ICD-10-CM

## 2021-11-06 DIAGNOSIS — E8881 Metabolic syndrome: Secondary | ICD-10-CM

## 2021-11-06 DIAGNOSIS — Z6841 Body Mass Index (BMI) 40.0 and over, adult: Secondary | ICD-10-CM | POA: Diagnosis not present

## 2021-11-06 DIAGNOSIS — E559 Vitamin D deficiency, unspecified: Secondary | ICD-10-CM

## 2021-11-06 DIAGNOSIS — E88819 Insulin resistance, unspecified: Secondary | ICD-10-CM

## 2021-11-06 MED ORDER — WEGOVY 2.4 MG/0.75ML ~~LOC~~ SOAJ: 2.4000 mg | SUBCUTANEOUS | 0 refills | Status: DC

## 2021-11-06 NOTE — Progress Notes (Signed)
Chief Complaint:   OBESITY Margaret Porter is here to discuss her progress with her obesity treatment plan along with follow-up of her obesity related diagnoses. Margaret Porter is on the Category 3 Plan and states she is following her eating plan approximately 80% of the time. Margaret Porter states she is doing gym exercise for 60 minutes 5 times per week.  Today's visit was #: 6 Starting weight: 262 lbs Starting date: 11/12/2018 Today's weight: 246 lbs Today's date: 11/06/2021 Total lbs lost to date: 16 lbs Total lbs lost since last in-office visit: 4 lbs  Interim History: Margaret Porter is skipping meals. She gets busy at school and skips lunch. She is eating out quite a bit. She says skipping meals affects her and makes her "hungry" and dizzy. She skips 2 out of 3 meals/day on the weekends.  She has cut out sugar sweetened beverages.   Subjective:   1. Insulin resistance [name]'s appetite is well controlled. She was skipping meals before Wegovy was started. She denies nausea or constipation.   Lab Results  Component Value Date   INSULIN 44.2 (H) 07/06/2021   INSULIN 43.5 (H) 01/26/2021   INSULIN 35.7 (H) 06/06/2020   INSULIN 30.4 (H) 02/16/2020   INSULIN 18.2 06/29/2019   Lab Results  Component Value Date   HGBA1C 5.2 07/06/2021    2. Vitamin D deficiency Clarrisa's Vitamin D is almost at goal 75. She is on weekly prescription Vitamin D.   Lab Results  Component Value Date   VD25OH 49.7 07/06/2021   VD25OH 56.2 01/26/2021   VD25OH 42.5 06/06/2020    3. At risk for impaired metabolic function Margaret Porter is at risk for impaired metabolic function due to Inadequate protein intake.   Assessment/Plan:   1. Insulin resistance Margaret Porter will continue to work on weight loss, exercise, and decreasing simple carbohydrates to help decrease the risk of diabetes. We will refill Wegovy 2.4 mg weekly. Margaret Porter agreed to follow-up with [name] as directed to closely monitor her progress.  -  Semaglutide-Weight Management (WEGOVY) 2.4 MG/0.75ML SOAJ; Inject 2.4 mg into the skin once a week.  Dispense: 3 mL; Refill: 0  2. Vitamin D deficiency Clarrisa agrees to continue to take prescription Vitamin D 50,000 IU every week and will follow-up for routine testing of Vitamin D, at least 2-3 times per year to avoid over-replacement.  3. At risk for impaired metabolic function Margaret Porter was given approximately 15 minutes of impaired  metabolic function prevention counseling today. We discussed intensive lifestyle modifications today with an emphasis on specific nutrition and exercise instructions and strategies.   Repetitive spaced learning was employed today to elicit superior memory formation and behavioral change.   4. Obesity, current BMI 43.59 Margaret Porter is currently in the action stage of change. As such, her goal is to continue with weight loss efforts. She has agreed to the Category 3 Plan.   Margaret Porter will have a shake for breakfast and work on taking food to school on Monday to have all week for lunch.   Exercise goals:  As is.   Behavioral modification strategies: increasing lean protein intake and no skipping meals.  Margaret Porter has agreed to follow-up with our clinic in 3-4 weeks.  Objective:   Blood pressure 115/77, pulse 88, temperature 97.6 F (36.4 C), height 5\' 3"  (1.6 m), weight 246 lb (111.6 kg), SpO2 98 %. Body mass index is 43.58 kg/m.  General: Cooperative, alert, well developed, in no acute distress. HEENT: Conjunctivae and lids unremarkable. Cardiovascular: Regular rhythm.  Lungs: Normal work of breathing. Neurologic: No focal deficits.   Lab Results  Component Value Date   CREATININE 1.13 (H) 10/30/2021   BUN 12 07/06/2021   NA 142 07/06/2021   K 4.5 07/06/2021   CL 103 07/06/2021   CO2 24 07/06/2021   Lab Results  Component Value Date   ALT 22 07/06/2021   AST 21 07/06/2021   ALKPHOS 114 07/06/2021   BILITOT 0.3 07/06/2021   Lab Results   Component Value Date   HGBA1C 5.2 07/06/2021   HGBA1C 5.3 01/26/2021   HGBA1C 5.3 01/13/2021   HGBA1C 5.4 06/06/2020   HGBA1C 5.4 02/11/2020   Lab Results  Component Value Date   INSULIN 44.2 (H) 07/06/2021   INSULIN 43.5 (H) 01/26/2021   INSULIN 35.7 (H) 06/06/2020   INSULIN 30.4 (H) 02/16/2020   INSULIN 18.2 06/29/2019   Lab Results  Component Value Date   TSH 2.760 02/16/2020   Lab Results  Component Value Date   CHOL 135 10/30/2021   HDL 41 (L) 10/30/2021   LDLCALC 77 10/30/2021   TRIG 83 10/30/2021   CHOLHDL 3.3 10/30/2021   Lab Results  Component Value Date   VD25OH 49.7 07/06/2021   VD25OH 56.2 01/26/2021   VD25OH 42.5 06/06/2020   Lab Results  Component Value Date   WBC 5.8 10/30/2021   HGB 15.2 10/30/2021   HCT 45.0 10/30/2021   MCV 93.9 10/30/2021   PLT 256 10/30/2021   Lab Results  Component Value Date   IRON 60 01/13/2021   Attestation Statements:   Reviewed by clinician on day of visit: allergies, medications, problem list, medical history, surgical history, family history, social history, and previous encounter notes.  I, Margaret Porter, RMA, am acting as Location manager for [employer], [place].   I have reviewed the above documentation for accuracy and completeness, and I agree with the above. -  Georgianne Fick, FNP

## 2021-11-13 LAB — HIV-1 RNA QUANT-NO REFLEX-BLD: HIV 1 RNA Quant: <40

## 2021-11-15 ENCOUNTER — Encounter: Payer: Self-pay | Admitting: Internal Medicine

## 2021-11-15 ENCOUNTER — Ambulatory Visit (INDEPENDENT_AMBULATORY_CARE_PROVIDER_SITE_OTHER): Payer: BC Managed Care – PPO

## 2021-11-15 ENCOUNTER — Ambulatory Visit (INDEPENDENT_AMBULATORY_CARE_PROVIDER_SITE_OTHER): Payer: BC Managed Care – PPO | Admitting: Internal Medicine

## 2021-11-15 ENCOUNTER — Other Ambulatory Visit: Payer: Self-pay

## 2021-11-15 VITALS — BP 125/83 | HR 77 | Temp 97.9°F | Wt 259.0 lb

## 2021-11-15 DIAGNOSIS — B20 Human immunodeficiency virus [HIV] disease: Secondary | ICD-10-CM | POA: Diagnosis not present

## 2021-11-15 DIAGNOSIS — Z23 Encounter for immunization: Secondary | ICD-10-CM | POA: Diagnosis not present

## 2021-11-15 MED ORDER — BICTEGRAVIR-EMTRICITAB-TENOFOV 50-200-25 MG PO TABS: 1.0000 | ORAL_TABLET | Freq: Every day | ORAL | 11 refills | Status: DC

## 2021-11-15 NOTE — Progress Notes (Signed)
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   Elevated LFTs 01/13/2021   Venous insufficiency 01/13/2021   Chronic abdominal pain 01/13/2021   Seasonal allergies 05/16/2020   Estrogen deficiency 01/11/2020   Perimenopausal 01/11/2020   Bony sclerosis 02/03/2019   S/P appendectomy 01/20/2019   Vaccine counseling 01/20/2019   Insulin resistance 12/09/2018   Routine general medical examination at a health care facility 12/22/2015   Vitamin D deficiency 12/22/2015   Need for pneumococcal vaccination 12/22/2015   Rosacea 04/22/2012   URINARY INCONTINENCE, URGE 10/24/2010   Other hyperlipidemia 11/06/2007   URTICARIA, IDIOPATHIC 03/06/2007    Patient's Medications  New Prescriptions   No medications on file  Previous Medications   AZITHROMYCIN (ZITHROMAX) 250 MG TABLET    2 tablets day 1, then 1 tablet days 2-4   DIPHENHYDRAMINE (BENADRYL) 25 MG TABLET    Take 25 mg by mouth every 6 (six) hours as needed for itching or allergies. Reported on 02/29/2016   HYOSCYAMINE (LEVSIN SL) 0.125 MG SL TABLET    Place 1 tablet (0.125 mg total) under the tongue 3 (three) times daily as needed.   SEMAGLUTIDE-WEIGHT MANAGEMENT (WEGOVY) 2.4 MG/0.75ML SOAJ    Inject 2.4 mg into the skin once a week.   VITAMIN D, ERGOCALCIFEROL, (DRISDOL) 1.25 MG (50000 UNIT) CAPS CAPSULE    Take 1 capsule (50,000 Units total) by mouth every 7 (seven) days.  Modified Medications   Modified Medication Previous Medication   BICTEGRAVIR-EMTRICITABINE-TENOFOVIR AF (BIKTARVY) 50-200-25 MG TABS TABLET bictegravir-emtricitabine-tenofovir AF (BIKTARVY) 50-200-25 MG TABS tablet      Take 1 tablet by mouth daily.     Take 1 tablet by mouth daily.  Discontinued Medications   No medications on file    Subjective: Margaret Porter is in for her routine HIV follow-up visit.  She denies any problems obtaining, taking or tolerating her Biktarvy.  However, she believes that she may have been missing doses a little more frequently recently.  She recalls missing 2 doses in the past month.  She has been taking the pill in the evening but has decided to take it first thing in the morning.  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  She is coaching middle school girls.  She received her last COVID booster vaccine in December of last year.  Review of Systems: Review of Systems  Constitutional:  Negative for weight loss.  Psychiatric/Behavioral:  Negative for depression.    Past Medical History:  Diagnosis Date   Allergic rhinitis    Allergy    Food allergy    HIV infection ([place]) 2006   Idiopathic urticaria    Joint pain    Lytic bone lesions on xray    bone lesions on hip,sternum and spine   Obesity    Pre-diabetes    pt not on meds   Rosacea    Sickle cell anemia (HCC)    Sickle cell trait with mom   Swelling of both lower extremities    per pt, left side is more swollen   Vitamin D deficiency     Social History   Tobacco Use   Smoking status:  Never   Smokeless tobacco: Never  Vaping Use   Vaping Use: Never used  Substance Use Topics   Alcohol use: Yes    Alcohol/week: 0.0 standard drinks    Comment: occasional (special occasions)   Drug use: No    Family History  Problem Relation Age of Onset   Diabetes Mother    Diabetes Brother    Heart disease Paternal Uncle    Heart disease Maternal Grandmother    Cancer Maternal Grandmother        breast   Heart disease Paternal Grandmother    Cancer Paternal Grandmother        breast   Diabetes Father    High blood pressure Father    Colon cancer Neg Hx    Colon polyps Neg Hx    Esophageal cancer Neg Hx    Rectal cancer Neg Hx    Stomach cancer Neg  Hx     Allergies  Allergen Reactions   Molds & Smuts     SOB, chest congestion   Oxycodone     Questionable mouth itching, sweats, but was also simultaneously on Cipro, Flagyl, and Robaxin.   Red Dye     Caused a rash with large amounts   Wellbutrin [Bupropion] Other (See Comments)    Eyes swelled 3 hours after taking one dose   Amoxicillin Rash   Aspirin Anxiety   Sulfamethoxazole-Trimethoprim Rash    Health Maintenance  Topic Date Due   Zoster Vaccines- Shingrix (1 of 2) Never done   Pneumococcal Vaccine 23-22 Years old (3 - PCV) 01/21/2020   COVID-19 Vaccine (4 - Booster for Pfizer series) 02/03/2021   PAP SMEAR-Modifier  10/05/2021   INFLUENZA VACCINE  03/30/2022 (Originally 07/31/2021)   MAMMOGRAM  12/21/2022   TETANUS/TDAP  12/21/2025   COLONOSCOPY (Pts 45-30yrs Insurance coverage will need to be confirmed)  03/08/2029   Hepatitis C Screening  Completed   HIV Screening  Completed   HPV VACCINES  Aged Out    Objective:  Vitals:   11/15/21 1128  BP: 125/83  Pulse: 77  Temp: 97.9 F (36.6 C)  TempSrc: Oral  SpO2: 97%  Weight: 259 lb (117.5 kg)   Body mass index is 45.88 kg/m.  Physical Exam Constitutional:      Comments: She is in good spirits.  Cardiovascular:     Rate and Rhythm: Normal rate.  Pulmonary:     Effort: Pulmonary effort is normal.  Psychiatric:        Mood and Affect: Mood normal.    Lab Results Lab Results  Component Value Date   WBC 5.8 10/30/2021   HGB 15.2 10/30/2021   HCT 45.0 10/30/2021   MCV 93.9 10/30/2021   PLT 256 10/30/2021    Lab Results  Component Value Date   CREATININE 1.13 (H) 10/30/2021   BUN 12 07/06/2021   NA 142 07/06/2021   K 4.5 07/06/2021   CL 103 07/06/2021   CO2 24 07/06/2021    Lab Results  Component Value Date   ALT 22 07/06/2021   AST 21 07/06/2021   ALKPHOS 114 07/06/2021   BILITOT 0.3 07/06/2021    Lab Results  Component Value Date   CHOL 135 10/30/2021   HDL 41 (L) 10/30/2021    LDLCALC 77 10/30/2021   TRIG 83 10/30/2021   CHOLHDL 3.3 10/30/2021   Lab Results  Component Value Date   LABRPR NON-REACTIVE 10/30/2021   HIV 1 RNA Quant  Date Value  12/20/2020 <40  11/09/2019 <20 NOT DETECTED copies/mL  04/03/2019 <20 NOT DETECTED copies/mL   HIV-1 RNA Viral Load (no units)  Date Value  08/04/2019 <40  02/16/2019 <40  08/19/2018 <40   CD4 (no units)  Date Value  11/08/2015 550  02/08/2015 443  12/21/2013 475   CD4 T Cell Abs  Date Value  10/30/2021 673 /uL  06/21/2021 620  11/09/2019 596 /uL     Problem List Items Addressed This Visit       High   Human immunodeficiency virus (HIV) disease ([name])    Her infection has been under excellent, long-term control.  Unfortunately a viral load was not obtained with her most recent blood work at the end of October.  She will get blood work today and continue [employer].  I encouraged her to change to taking it first thing in the morning before she gets distracted.  She received her annual influenza vaccine and a COVID booster vaccine here today.  She will follow-up in 1 year.      Relevant Medications   bictegravir-emtricitabine-tenofovir AF (BIKTARVY) 50-200-25 MG TABS tablet   Other Relevant Orders   CBC   T-helper cell (CD4)- ([facility] clinic only)   Comprehensive metabolic panel   Lipid panel   RPR   HIV-1 RNA quant-no reflex-bld   HIV-1 RNA quant-no reflex-bld      Michel Bickers, MD [facility] [place] 336 [phone] pager   [phone] cell 11/15/2021, 11:53 AM

## 2021-11-15 NOTE — Assessment & Plan Note (Signed)
Her infection has been under excellent, long-term control.  Unfortunately a viral load was not obtained with her most recent blood work at the end of October.  She will get blood work today and continue [employer].  I encouraged her to change to taking it first thing in the morning before she gets distracted.  She received her annual influenza vaccine and a COVID booster vaccine here today.  She will follow-up in 1 year.

## 2021-11-15 NOTE — Progress Notes (Signed)
   Covid-19 Vaccination Clinic  Name:  Margaret Porter    MRN: [id number] DOB: Jul 27, 1970  11/15/2021  Margaret Porter was observed post Covid-19 immunization for 15 minutes without incident. She was provided with Vaccine Information Sheet and instruction to access the V-Safe system.   Margaret Porter was instructed to call 911 with any severe reactions post vaccine: Difficulty breathing  Swelling of face and throat  A fast heartbeat  A bad rash all over body  Dizziness and weakness   Immunizations Administered     Name Date Dose VIS Date Route   Pfizer Covid-19 Vaccine Bivalent Booster 11/15/2021 11:59 AM 0.3 mL 08/30/2021 Intramuscular   Manufacturer: [place]   Lot: [id number]   [place]: [place], [place]

## 2021-11-16 ENCOUNTER — Encounter: Payer: Self-pay | Admitting: *Deleted

## 2021-11-17 LAB — HIV-1 RNA QUANT-NO REFLEX-BLD
HIV 1 RNA Quant: NOT DETECTED Copies/mL
HIV-1 RNA Quant, Log: NOT DETECTED Log cps/mL

## 2021-11-20 ENCOUNTER — Other Ambulatory Visit: Payer: Self-pay | Admitting: Internal Medicine

## 2021-11-20 DIAGNOSIS — B20 Human immunodeficiency virus [HIV] disease: Secondary | ICD-10-CM

## 2021-11-24 ENCOUNTER — Encounter: Payer: Self-pay | Admitting: Internal Medicine

## 2021-11-27 ENCOUNTER — Other Ambulatory Visit: Payer: Self-pay

## 2021-11-27 DIAGNOSIS — B20 Human immunodeficiency virus [HIV] disease: Secondary | ICD-10-CM

## 2021-11-27 MED ORDER — BICTEGRAVIR-EMTRICITAB-TENOFOV 50-200-25 MG PO TABS: 1.0000 | ORAL_TABLET | Freq: Every day | ORAL | 11 refills | Status: DC

## 2021-11-29 ENCOUNTER — Ambulatory Visit (INDEPENDENT_AMBULATORY_CARE_PROVIDER_SITE_OTHER): Payer: BC Managed Care – PPO | Admitting: Family Medicine

## 2021-11-29 ENCOUNTER — Encounter (INDEPENDENT_AMBULATORY_CARE_PROVIDER_SITE_OTHER): Payer: Self-pay | Admitting: Family Medicine

## 2021-11-29 ENCOUNTER — Other Ambulatory Visit: Payer: Self-pay

## 2021-11-29 VITALS — BP 116/79 | HR 79 | Temp 98.2°F | Ht 63.0 in | Wt 251.0 lb

## 2021-11-29 DIAGNOSIS — E8881 Metabolic syndrome: Secondary | ICD-10-CM

## 2021-11-29 DIAGNOSIS — Z6841 Body Mass Index (BMI) 40.0 and over, adult: Secondary | ICD-10-CM

## 2021-11-29 DIAGNOSIS — E88819 Insulin resistance, unspecified: Secondary | ICD-10-CM

## 2021-11-29 NOTE — Progress Notes (Signed)
Chief Complaint:   OBESITY Margaret Porter is here to discuss her progress with her obesity treatment plan along with follow-up of her obesity related diagnoses. Margaret Porter is on the Category 3 Plan and states she is following her eating plan approximately 70% of the time. Margaret Porter states she is doing 0 minutes 0 times per week.  Today's visit was #: 63 Starting weight: 262 lbs Starting date: 11/12/2018 Today's weight: 251 lbs Today's date: 11/29/2021 Total lbs lost to date: 11 lbs Total lbs lost since last in-office visit: 0  Interim History: Margaret Porter is eating breakfast more. However, she is skipping lunch at school. 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 She is up 4 lbs of water today.   She has had some sugar sweetened beverages recently.  Subjective:   1. Insulin resistance Margaret Porter denies polyphagia but does feel dizzy if she skips meals. She denies constipation or nausea.  Lab Results  Component Value Date   INSULIN 44.2 (H) 07/06/2021   INSULIN 43.5 (H) 01/26/2021   INSULIN 35.7 (H) 06/06/2020   INSULIN 30.4 (H) 02/16/2020   INSULIN 18.2 06/29/2019   Lab Results  Component Value Date   HGBA1C 5.2 07/06/2021    Assessment/Plan:   1. Insulin resistance  Margaret Porter will continue Wegovy. Margaret Porter agreed to follow-up with [name] as directed to closely monitor her progress.  2. Obesity, current BMI 44.47 Margaret Porter is currently in the action stage of change. As such, her goal is to continue with weight loss efforts. She has agreed to the Category 3 Plan.     Exercise goals: All adults should avoid inactivity. Some physical activity is better than none, and adults who participate in any amount of physical activity gain some health benefits.  Behavioral modification strategies: no skipping meals.  Margaret Porter has agreed to follow-up with our clinic in 3-5 weeks.  Objective:   Blood pressure 116/79, pulse  79, temperature 98.2 F (36.8 C), height 5\' 3"  (1.6 m), weight 251 lb (113.9 kg), SpO2 98 %. Body mass index is 44.46 kg/m.  General: Cooperative, alert, well developed, in no acute distress. HEENT: Conjunctivae and lids unremarkable. Cardiovascular: Regular rhythm.  Lungs: Normal work of breathing. Neurologic: No focal deficits.   Lab Results  Component Value Date   CREATININE 1.13 (H) 10/30/2021   BUN 12 07/06/2021   NA 142 07/06/2021   K 4.5 07/06/2021   CL 103 07/06/2021   CO2 24 07/06/2021   Lab Results  Component Value Date   ALT 22 07/06/2021   AST 21 07/06/2021   ALKPHOS 114 07/06/2021   BILITOT 0.3 07/06/2021   Lab Results  Component Value Date   HGBA1C 5.2 07/06/2021   HGBA1C 5.3 01/26/2021   HGBA1C 5.3 01/13/2021   HGBA1C 5.4 06/06/2020   HGBA1C 5.4 02/11/2020   Lab Results  Component Value Date   INSULIN 44.2 (H) 07/06/2021   INSULIN 43.5 (H) 01/26/2021   INSULIN 35.7 (H) 06/06/2020   INSULIN 30.4 (H) 02/16/2020   INSULIN 18.2 06/29/2019   Lab Results  Component Value Date   TSH 2.760 02/16/2020   Lab Results  Component Value Date   CHOL 135 10/30/2021   HDL 41 (L) 10/30/2021   LDLCALC 77 10/30/2021   TRIG 83 10/30/2021   CHOLHDL 3.3 10/30/2021   Lab Results  Component Value Date   VD25OH 49.7 07/06/2021   VD25OH 56.2 01/26/2021   VD25OH 42.5 06/06/2020   Lab  Results  Component Value Date   WBC 5.8 10/30/2021   HGB 15.2 10/30/2021   HCT 45.0 10/30/2021   MCV 93.9 10/30/2021   PLT 256 10/30/2021   Lab Results  Component Value Date   IRON 60 01/13/2021   Attestation Statements:   Reviewed by clinician on day of visit: allergies, medications, problem list, medical history, surgical history, family history, social history, and previous encounter notes.  I, Lizbeth Bark, RMA, am acting as Location manager for [employer], [place].   I have reviewed the above documentation for accuracy and completeness, and I agree with the above. -   Georgianne Fick, FNP

## 2021-12-20 ENCOUNTER — Ambulatory Visit (INDEPENDENT_AMBULATORY_CARE_PROVIDER_SITE_OTHER): Payer: BC Managed Care – PPO | Admitting: Family Medicine

## 2021-12-21 ENCOUNTER — Encounter (INDEPENDENT_AMBULATORY_CARE_PROVIDER_SITE_OTHER): Payer: Self-pay | Admitting: Adult Health

## 2021-12-21 ENCOUNTER — Other Ambulatory Visit: Payer: Self-pay

## 2021-12-21 ENCOUNTER — Ambulatory Visit (INDEPENDENT_AMBULATORY_CARE_PROVIDER_SITE_OTHER): Payer: BC Managed Care – PPO | Admitting: Adult Health

## 2021-12-21 VITALS — BP 134/83 | HR 80 | Temp 97.7°F | Ht 63.0 in | Wt 253.0 lb

## 2021-12-21 DIAGNOSIS — Z9189 Other specified personal risk factors, not elsewhere classified: Secondary | ICD-10-CM | POA: Diagnosis not present

## 2021-12-21 DIAGNOSIS — N181 Chronic kidney disease, stage 1: Secondary | ICD-10-CM | POA: Diagnosis not present

## 2021-12-21 DIAGNOSIS — E8881 Metabolic syndrome: Secondary | ICD-10-CM | POA: Diagnosis not present

## 2021-12-21 DIAGNOSIS — E559 Vitamin D deficiency, unspecified: Secondary | ICD-10-CM

## 2021-12-21 DIAGNOSIS — Z6841 Body Mass Index (BMI) 40.0 and over, adult: Secondary | ICD-10-CM

## 2021-12-21 MED ORDER — VITAMIN D (ERGOCALCIFEROL) 1.25 MG (50000 UNIT) PO CAPS: 50000.0000 [IU] | ORAL_CAPSULE | ORAL | 0 refills | Status: DC

## 2021-12-21 NOTE — Progress Notes (Signed)
Chief Complaint:   OBESITY Margaret Porter is here to discuss her progress with her obesity treatment plan along with follow-up of her obesity related diagnoses. Margaret Porter is on the Category 3 Plan and states she is following her eating plan approximately 60% of the time. Margaret Porter states she is not exercising regularly at this time.  Today's visit was #: 31 Starting weight: 262 lbs Starting date: 11/12/2018 Today's weight: 253 lbs Today's date: 12/21/2021 Total lbs lost to date: 9 lbs Total lbs lost since last in-office visit: 0  Interim History:  Margaret Porter's father was recently hospitalized for CHF exacerbation- will likely be d/c'd home today. She has been eating out more frequently - Margaret Porter, [employer] - chicken or fish combos - tries to not eat all the fries, but will drink sweet tea or lemonade.  Very little water intake.  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  She will travel over summer break, last two summers she has travelled to [place] and [place].  Subjective:   1. Insulin resistance On 07/06/2021, insulin level 44.2. She is on Wegovy 2.4 mg - injects on Sundays - missed last week.  She she injects, she denies mass in neck, dysphagia, dyspepsia, persistent hoarseness, or GI upset.  2. Stage 1 chronic kidney disease On 10/30/2021, creatinine was 1.13. On 07/06/2021, creatinine was 1.14.  GFR 58  Stable CKD.  3. Vitamin D deficiency Vitamin D level  - 07/06/2021 - 49.7 - below goal of 50. She is currently taking prescription ergocalciferol 50,000 IU each week. She denies nausea, vomiting or muscle weakness.  4. At risk for dehydration Margaret Porter is at risk for dehydration due to little water intake.  Assessment/Plan:   1. Insulin resistance Continue Wegovy 2.4mg  once week, does not require RF today.  2. Stage 1 chronic kidney disease Avoid NSAIDs, monitor labs.  3. Vitamin D deficiency Refill ergocalciferol 50,000 IU once weekly.  - Refill Vitamin D,  Ergocalciferol, (DRISDOL) 1.25 MG (50000 UNIT) CAPS capsule; Take 1 capsule (50,000 Units total) by mouth every 7 (seven) days.  Dispense: 12 capsule; Refill: 0  4. At risk for dehydration Margaret Porter was given approximately 15 minutes dehydration prevention counseling today. Margaret Porter is at risk for dehydration due to weight loss and current medication(s). She was encouraged to hydrate and monitor fluid status to avoid dehydration as well as weight loss plateaus.   5. Obesity, current BMI 45.0  Margaret Porter is currently in the action stage of change. As such, her goal is to continue with weight loss efforts. She has agreed to the Category 3 Plan.   Check fasting labs at next office visit.  Exercise goals: No exercise has been prescribed at this time.  Behavioral modification strategies: increasing lean protein intake, decreasing simple carbohydrates, increasing water intake, keeping healthy foods in the home, ways to avoid boredom eating, and planning for success.  Margaret Porter has agreed to follow-up with our clinic in 4 weeks, fasting. She was informed of the importance of frequent follow-up visits to maximize her success with intensive lifestyle modifications for her multiple health conditions.   Objective:   Blood pressure 134/83, pulse 80, temperature 97.7 F (36.5 C), height 5\' 3"  (1.6 m), weight 253 lb (114.8 kg), SpO2 100 %. Body mass index is 44.82 kg/m.  General: Cooperative, alert, well developed, in no acute distress. HEENT: Conjunctivae and lids unremarkable. Cardiovascular: Regular rhythm.  Lungs: Normal work of breathing. Neurologic: No focal deficits.   Lab Results  Component Value Date  CREATININE 1.13 (H) 10/30/2021   BUN 12 07/06/2021   NA 142 07/06/2021   K 4.5 07/06/2021   CL 103 07/06/2021   CO2 24 07/06/2021   Lab Results  Component Value Date   ALT 22 07/06/2021   AST 21 07/06/2021   ALKPHOS 114 07/06/2021   BILITOT 0.3 07/06/2021   Lab Results  Component  Value Date   HGBA1C 5.2 07/06/2021   HGBA1C 5.3 01/26/2021   HGBA1C 5.3 01/13/2021   HGBA1C 5.4 06/06/2020   HGBA1C 5.4 02/11/2020   Lab Results  Component Value Date   INSULIN 44.2 (H) 07/06/2021   INSULIN 43.5 (H) 01/26/2021   INSULIN 35.7 (H) 06/06/2020   INSULIN 30.4 (H) 02/16/2020   INSULIN 18.2 06/29/2019   Lab Results  Component Value Date   TSH 2.760 02/16/2020   Lab Results  Component Value Date   CHOL 135 10/30/2021   HDL 41 (L) 10/30/2021   LDLCALC 77 10/30/2021   TRIG 83 10/30/2021   CHOLHDL 3.3 10/30/2021   Lab Results  Component Value Date   VD25OH 49.7 07/06/2021   VD25OH 56.2 01/26/2021   VD25OH 42.5 06/06/2020   Lab Results  Component Value Date   WBC 5.8 10/30/2021   HGB 15.2 10/30/2021   HCT 45.0 10/30/2021   MCV 93.9 10/30/2021   PLT 256 10/30/2021   Lab Results  Component Value Date   IRON 60 01/13/2021   Attestation Statements:   Reviewed by clinician on day of visit: allergies, medications, problem list, medical history, surgical history, family history, social history, and previous encounter notes.  I, Water quality scientist, CMA, am acting as Location manager for Mina Marble, NP.  I have reviewed the above documentation for accuracy and completeness, and I agree with the above. -  Margaret Porter d. Margaret Foskey, NP-C

## 2022-01-18 ENCOUNTER — Other Ambulatory Visit: Payer: Self-pay

## 2022-01-18 ENCOUNTER — Encounter (INDEPENDENT_AMBULATORY_CARE_PROVIDER_SITE_OTHER): Payer: Self-pay | Admitting: Family Medicine

## 2022-01-18 ENCOUNTER — Ambulatory Visit (INDEPENDENT_AMBULATORY_CARE_PROVIDER_SITE_OTHER): Payer: BC Managed Care – PPO | Admitting: Family Medicine

## 2022-01-18 VITALS — BP 112/73 | HR 79 | Temp 98.1°F | Ht 63.0 in | Wt 257.0 lb

## 2022-01-18 DIAGNOSIS — Z6841 Body Mass Index (BMI) 40.0 and over, adult: Secondary | ICD-10-CM | POA: Diagnosis not present

## 2022-01-18 DIAGNOSIS — E8881 Metabolic syndrome: Secondary | ICD-10-CM | POA: Diagnosis not present

## 2022-01-18 DIAGNOSIS — E88819 Insulin resistance, unspecified: Secondary | ICD-10-CM

## 2022-01-18 DIAGNOSIS — E559 Vitamin D deficiency, unspecified: Secondary | ICD-10-CM | POA: Diagnosis not present

## 2022-01-18 MED ORDER — ONDANSETRON HCL 8 MG PO TABS: 8.0000 mg | ORAL_TABLET | Freq: Three times a day (TID) | ORAL | 0 refills | Status: DC | PRN

## 2022-01-18 NOTE — Progress Notes (Signed)
Chief Complaint:   OBESITY Arliene is here to discuss her progress with her obesity treatment plan along with follow-up of her obesity related diagnoses. Meris is on the Category 3 Plan and states she is following her eating plan approximately 50% of the time. Akeria states she is walking for 30 minutes 5 times per week.  Today's visit was #: 43 Starting weight: 262 lbs Starting date: 11/12/2018 Today's weight: 257 lbs Today's date: 01/18/2022 Total lbs lost to date: 5 lbs Total lbs lost since last in-office visit: 0  Interim History: Henli's dad was hospitalized over the holidays.  She has not adhered to plan. She has been eating out up to 3 times per day, but 2 times per day most days. She also has continued to drink sodas and sweet tea. She is ready to get back to some healthier habits.   Subjective:   1. Insulin resistance Lezley has been off Wegovy for 2-3 weeks. I am concerned she will have nausea with restart of Wegovy.  Lab Results  Component Value Date   INSULIN 44.2 (H) 07/06/2021   INSULIN 43.5 (H) 01/26/2021   INSULIN 35.7 (H) 06/06/2020   INSULIN 30.4 (H) 02/16/2020   INSULIN 18.2 06/29/2019   Lab Results  Component Value Date   HGBA1C 5.2 07/06/2021    2. Vitamin D deficiency Khamiya's Vitamin D is nearly at goal (49.7). She is on weekly prescription Vitamin D.  Lab Results  Component Value Date   VD25OH 49.7 07/06/2021   VD25OH 56.2 01/26/2021   VD25OH 42.5 06/06/2020    Assessment/Plan:   1. Insulin resistance  She will continue Wegovy 2.4 mg weekly. We will check labs today. New prescription -Zofran 8 mg every 8 hours as needed for nausea.  - Comprehensive metabolic panel - Hemoglobin A1c - Insulin, random - ondansetron (ZOFRAN) 8 MG tablet; Take 1 tablet (8 mg total) by mouth every 8 (eight) hours as needed for nausea or vomiting.  Dispense: 20 tablet; Refill: 0  2. Vitamin D deficiency  We will check Vitamin D today.  Xandra  agrees to continue to take prescription Vitamin D 50,000 IU every week and she will follow-up for routine testing of Vitamin D, at least 2-3 times per year to avoid over-replacement.  - VITAMIN D 25 Hydroxy (Vit-D Deficiency, Fractures)  3. Obesity, current BMI 45.54 Maylene is currently in the action stage of change. As such, her goal is to continue with weight loss efforts. She has agreed to the Category 3 Plan.   Nekeya will reduce eating out to 1 times per day. She will try to reduce sugar sweetened beverages. We discussed bariatric surgery. Information was provided to sign up for the bariatric surgery seminar [facility] seminar). Patient was advised that bariatric surgery is a tool for weight loss and they will not be successful long-term without significant dietary changes and exercise.   Exercise goals:  As is.  Behavioral modification strategies: decreasing liquid calories and decreasing eating out.  Sarah has agreed to follow-up with our clinic in 4 weeks.  Objective:   Blood pressure 112/73, pulse 79, temperature 98.1 F (36.7 C), height 5\' 3"  (1.6 m), weight 257 lb (116.6 kg), SpO2 95 %. Body mass index is 45.53 kg/m.  General: Cooperative, alert, well developed, in no acute distress. HEENT: Conjunctivae and lids unremarkable. Cardiovascular: Regular rhythm.  Lungs: Normal work of breathing. Neurologic: No focal deficits.   Lab Results  Component Value Date  CREATININE 1.13 (H) 10/30/2021   BUN 12 07/06/2021   NA 142 07/06/2021   K 4.5 07/06/2021   CL 103 07/06/2021   CO2 24 07/06/2021   Lab Results  Component Value Date   ALT 22 07/06/2021   AST 21 07/06/2021   ALKPHOS 114 07/06/2021   BILITOT 0.3 07/06/2021   Lab Results  Component Value Date   HGBA1C 5.2 07/06/2021   HGBA1C 5.3 01/26/2021   HGBA1C 5.3 01/13/2021   HGBA1C 5.4 06/06/2020   HGBA1C 5.4 02/11/2020   Lab Results  Component Value Date   INSULIN  44.2 (H) 07/06/2021   INSULIN 43.5 (H) 01/26/2021   INSULIN 35.7 (H) 06/06/2020   INSULIN 30.4 (H) 02/16/2020   INSULIN 18.2 06/29/2019   Lab Results  Component Value Date   TSH 2.760 02/16/2020   Lab Results  Component Value Date   CHOL 135 10/30/2021   HDL 41 (L) 10/30/2021   LDLCALC 77 10/30/2021   TRIG 83 10/30/2021   CHOLHDL 3.3 10/30/2021   Lab Results  Component Value Date   VD25OH 49.7 07/06/2021   VD25OH 56.2 01/26/2021   VD25OH 42.5 06/06/2020   Lab Results  Component Value Date   WBC 5.8 10/30/2021   HGB 15.2 10/30/2021   HCT 45.0 10/30/2021   MCV 93.9 10/30/2021   PLT 256 10/30/2021   Lab Results  Component Value Date   IRON 60 01/13/2021   Attestation Statements:   Reviewed by clinician on day of visit: allergies, medications, problem list, medical history, surgical history, family history, social history, and previous encounter notes.  I, Lizbeth Bark, RMA, am acting as Location manager for [employer], [place].  I have reviewed the above documentation for accuracy and completeness, and I agree with the above. -  Georgianne Fick, FNP

## 2022-01-19 LAB — HEMOGLOBIN A1C
Est. average glucose Bld gHb Est-mCnc: 103 mg/dL
Hgb A1c MFr Bld: 5.2 % (ref 4.8–5.6)

## 2022-01-19 LAB — COMPREHENSIVE METABOLIC PANEL
ALT: 24 IU/L (ref 0–32)
AST: 20 IU/L (ref 0–40)
Albumin/Globulin Ratio: 2.5 — ABNORMAL HIGH (ref 1.2–2.2)
Albumin: 4.3 g/dL (ref 3.8–4.9)
Alkaline Phosphatase: 122 IU/L — ABNORMAL HIGH (ref 44–121)
BUN/Creatinine Ratio: 14 (ref 9–23)
BUN: 15 mg/dL (ref 6–24)
Bilirubin Total: 0.3 mg/dL (ref 0.0–1.2)
CO2: 23 mmol/L (ref 20–29)
Calcium: 9.1 mg/dL (ref 8.7–10.2)
Chloride: 107 mmol/L — ABNORMAL HIGH (ref 96–106)
Creatinine, Ser: 1.11 mg/dL — ABNORMAL HIGH (ref 0.57–1.00)
Globulin, Total: 1.7 g/dL (ref 1.5–4.5)
Glucose: 92 mg/dL (ref 70–99)
Potassium: 4.5 mmol/L (ref 3.5–5.2)
Sodium: 143 mmol/L (ref 134–144)
Total Protein: 6 g/dL (ref 6.0–8.5)
eGFR: 59 mL/min/{1.73_m2} — ABNORMAL LOW (ref >59)

## 2022-01-19 LAB — VITAMIN D 25 HYDROXY (VIT D DEFICIENCY, FRACTURES): Vit D, 25-Hydroxy: 61.6 ng/mL (ref 30.0–100.0)

## 2022-01-19 LAB — INSULIN, RANDOM: INSULIN: 30.5 u[IU]/mL — ABNORMAL HIGH (ref 2.6–24.9)

## 2022-01-22 ENCOUNTER — Encounter: Payer: Self-pay | Admitting: Medical

## 2022-01-22 ENCOUNTER — Other Ambulatory Visit: Payer: Self-pay

## 2022-01-22 ENCOUNTER — Ambulatory Visit: Payer: BC Managed Care – PPO | Admitting: Medical

## 2022-01-22 VITALS — BP 122/80 | HR 65 | Ht 64.0 in | Wt 260.6 lb

## 2022-01-22 DIAGNOSIS — L719 Rosacea, unspecified: Secondary | ICD-10-CM

## 2022-01-22 DIAGNOSIS — Z0000 Encounter for general adult medical examination without abnormal findings: Secondary | ICD-10-CM

## 2022-01-22 DIAGNOSIS — R748 Abnormal levels of other serum enzymes: Secondary | ICD-10-CM

## 2022-01-22 DIAGNOSIS — J302 Other seasonal allergic rhinitis: Secondary | ICD-10-CM

## 2022-01-22 DIAGNOSIS — B20 Human immunodeficiency virus [HIV] disease: Secondary | ICD-10-CM

## 2022-01-22 DIAGNOSIS — K760 Fatty (change of) liver, not elsewhere classified: Secondary | ICD-10-CM | POA: Diagnosis not present

## 2022-01-22 DIAGNOSIS — E8881 Metabolic syndrome: Secondary | ICD-10-CM | POA: Diagnosis not present

## 2022-01-22 DIAGNOSIS — E559 Vitamin D deficiency, unspecified: Secondary | ICD-10-CM

## 2022-01-22 DIAGNOSIS — Z136 Encounter for screening for cardiovascular disorders: Secondary | ICD-10-CM

## 2022-01-22 DIAGNOSIS — I872 Venous insufficiency (chronic) (peripheral): Secondary | ICD-10-CM

## 2022-01-22 DIAGNOSIS — E2839 Other primary ovarian failure: Secondary | ICD-10-CM

## 2022-01-22 DIAGNOSIS — R7989 Other specified abnormal findings of blood chemistry: Secondary | ICD-10-CM

## 2022-01-22 DIAGNOSIS — E7849 Other hyperlipidemia: Secondary | ICD-10-CM

## 2022-01-22 DIAGNOSIS — Z6841 Body Mass Index (BMI) 40.0 and over, adult: Secondary | ICD-10-CM

## 2022-01-22 DIAGNOSIS — Z7185 Encounter for immunization safety counseling: Secondary | ICD-10-CM

## 2022-01-22 DIAGNOSIS — Z9049 Acquired absence of other specified parts of digestive tract: Secondary | ICD-10-CM

## 2022-01-22 NOTE — Progress Notes (Signed)
Subjective:   HPI  Margaret Porter is a 52 y.o. female who presents for Chief Complaint  Patient presents with   fasting cpe    Fasting cpe, no concern. Sees obgyn     Patient Care Team: Wilver Tignor, Camelia Eng, PA-C as PCP - General (Family Medicine) Michel Bickers, MD as PCP - Infectious Diseases (Infectious Diseases) Servando Salina, MD as Consulting Physician (Obstetrics and Gynecology) Georgia Lopes, DO as Consulting Physician [facility]) Sees dentist Sees eye doctor  Concerns: She has ongoing care through infectious disease clinic  Sees weigh management clinic.  Consider weight loss surgery consult as she cant seem to get weight off.  She is walking for exercise, tries to eat healthy.   Getting ready to start back on Wegovy.  Gets some left low to mid back discomfort sometime when lying flat.  No injury, no fall, no urinary c/o.  Parents have both recently been dealing with heart issues.  Father with CAD, stent and may be getting a pacemaker . He has to wear life vest currently.  Otherwise in usual state of health  Past Medical History:  Diagnosis Date   Allergic rhinitis    Allergy    Food allergy    HIV infection ([place]) 2006   Idiopathic urticaria    Joint pain    Lytic bone lesions on xray    bone lesions on hip,sternum and spine   Obesity    Pre-diabetes    pt not on meds   Rosacea    Sickle cell anemia (HCC)    Sickle cell trait with mom   Swelling of both lower extremities    per pt, left side is more swollen   Vitamin D deficiency     Family History  Problem Relation Age of Onset   Heart disease Mother        cardiomyopathy related to covid infection   Diabetes Mother    Heart disease Father        CAD, considering pacemaker 2023   Diabetes Father    High blood pressure Father    Arrhythmia Father    Diabetes Brother    Heart disease Paternal Uncle    Heart disease Maternal Grandmother    Cancer Maternal Grandmother        breast    Heart disease Paternal Grandmother    Cancer Paternal Grandmother        breast   Colon cancer Neg Hx    Colon polyps Neg Hx    Esophageal cancer Neg Hx    Rectal cancer Neg Hx    Stomach cancer Neg Hx      Current Outpatient Medications:    bictegravir-emtricitabine-tenofovir AF (BIKTARVY) 50-200-25 MG TABS tablet, Take 1 tablet by mouth daily., Disp: 30 tablet, Rfl: 11   diphenhydrAMINE (BENADRYL) 25 MG tablet, Take 25 mg by mouth every 6 (six) hours as needed for itching or allergies. Reported on 02/29/2016, Disp: , Rfl:    ondansetron (ZOFRAN) 8 MG tablet, Take 1 tablet (8 mg total) by mouth every 8 (eight) hours as needed for nausea or vomiting., Disp: 20 tablet, Rfl: 0   Semaglutide-Weight Management (WEGOVY) 2.4 MG/0.75ML SOAJ, Inject 2.4 mg into the skin once a week., Disp: 3 mL, Rfl: 0   Vitamin D, Ergocalciferol, (DRISDOL) 1.25 MG (50000 UNIT) CAPS capsule, Take 1 capsule (50,000 Units total) by mouth every 7 (seven) days., Disp: 12 capsule, Rfl: 0   hyoscyamine (LEVSIN SL) 0.125 MG SL tablet, Place 1  tablet (0.125 mg total) under the tongue 3 (three) times daily as needed., Disp: 30 tablet, Rfl: 0  Allergies  Allergen Reactions   Molds & Smuts     SOB, chest congestion   Oxycodone     Questionable mouth itching, sweats, but was also simultaneously on Cipro, Flagyl, and Robaxin.   Red Dye     Caused a rash with large amounts   Wellbutrin [Bupropion] Other (See Comments)    Eyes swelled 3 hours after taking one dose   Amoxicillin Rash   Aspirin Anxiety   Sulfamethoxazole-Trimethoprim Rash     Reviewed their medical, surgical, family, social, medication, and allergy history and updated chart as appropriate.   Review of Systems Constitutional: -fever, -chills, -sweats, -unexpected weight change, -decreased appetite, -fatigue Allergy: -sneezing, -itching, -congestion Dermatology: -changing moles, --rash, -lumps ENT: -runny nose, -ear pain, -sore throat, -hoarseness,  -sinus pain, -teeth pain, - ringing in ears, -hearing loss, -nosebleeds Cardiology: -chest pain, -palpitations, -swelling, -difficulty breathing when lying flat, -waking up short of breath Respiratory: -cough, -shortness of breath, -difficulty breathing with exercise or exertion, -wheezing, -coughing up blood Gastroenterology: -abdominal pain, -nausea, -vomiting, -diarrhea, -constipation, -blood in stool, -changes in bowel movement, -difficulty swallowing or eating Hematology: -bleeding, -bruising  Musculoskeletal: -joint aches, -muscle aches, -joint swelling, +back pain, -neck pain, -cramping, -changes in gait Ophthalmology: denies vision changes, eye redness, itching, discharge Urology: -burning with urination, -difficulty urinating, -blood in urine, -urinary frequency, -urgency, -incontinence Neurology: -headache, -weakness, -tingling, -numbness, -memory loss, -falls, -dizziness Psychology: -depressed mood, -agitation, -sleep problems Breast/gyn: -breast tendnerss, -discharge, -lumps, -vaginal discharge,- irregular periods, -heavy periods     Objective:  BP 122/80    Pulse 65    Ht 5\' 4"  (1.626 m)    Wt 260 lb 9.6 oz (118.2 kg)    BMI 44.73 kg/m   General appearance: alert, no distress, WD/WN, African American female Skin: Unremarkable HEENT: normocephalic, conjunctiva/corneas normal, sclerae anicteric, PERRLA, EOMi Neck: supple, no lymphadenopathy, no thyromegaly, no masses, normal ROM, no bruits Chest: non tender, normal shape and expansion Heart: RRR, normal S1, S2, no murmurs Lungs: CTA bilaterally, no wheezes, rhonchi, or rales Abdomen: +bs, soft, non tender, non distended, no masses, no hepatomegaly, no splenomegaly, no bruits Back: non tender, normal ROM, no scoliosis Musculoskeletal: upper extremities non tender, no obvious deformity, normal ROM throughout, lower extremities non tender, no obvious deformity, normal ROM throughout Extremities: Left lower leg with slight asymmetry  larger in diameter than the right lower leg, there is some spider veins of the left lower leg but no obvious mass no obvious edema, no edema, no cyanosis, no clubbing Pulses: 2+ symmetric, upper and lower extremities, normal cap refill Neurological: alert, oriented x 3, CN2-12 intact, strength normal upper extremities and lower extremities, sensation normal throughout, DTRs 2+ throughout, no cerebellar signs, gait normal Psychiatric: normal affect, behavior normal, pleasant  Breast/gyn/rectal - deferred to gynecology  EKG Indication Screening for heart disease, well visit, rate 62 bpm, PR 130 ms, QRS 66 ms, QTC 412 ms, axis 34 degrees, normal sinus rhythm   Assessment and Plan :   Encounter Diagnoses  Name Primary?   Routine general medical examination at a health care facility Yes   Venous insufficiency    Hepatic steatosis    Insulin resistance    Vitamin D deficiency    Vaccine counseling    Seasonal allergies    S/P appendectomy    Other hyperlipidemia    Human immunodeficiency virus (HIV) disease ([place])  Estrogen deficiency    Class 3 severe obesity due to excess calories with serious comorbidity and body mass index (BMI) of 45.0 to 49.9 in adult [facility])    Rosacea    Screening for heart disease    Elevated serum creatinine    Alkaline phosphatase elevation     Today you had a preventative care visit or wellness visit.    Topics today may have included healthy lifestyle, diet, exercise, preventative care, vaccinations, sick and well care, proper use of emergency dept and after hours care, as well as other concerns.     Recommendations: Continue to return yearly for your annual wellness and preventative care visits.  This gives [name] a chance to discuss healthy lifestyle, exercise, vaccinations, review your chart record, and perform screenings where appropriate.  I recommend you see your eye doctor yearly for routine vision care.  I recommend you see your dentist yearly for  routine dental care including hygiene visits twice yearly.  Continue routine follow-up with infectious disease and gynecology    Vaccination recommendations were reviewed Immunization History  Administered Date(s) Administered   H1N1 04/12/2009   Hepatitis B 08/31/2004, 10/03/2004, 05/08/2005   Influenza Split 11/21/2011, 10/14/2012   Influenza Whole 11/11/2006, 10/02/2007, 10/11/2009, 10/24/2010   Influenza,inj,Quad PF,6+ Mos 10/20/2013, 11/29/2014, 11/23/2015, 10/29/2016, 09/18/2017, 11/24/2019, 11/15/2020, 11/15/2021   PFIZER(Purple Top)SARS-COV-2 Vaccination 02/27/2020, 03/19/2020, 12/09/2020   Pfizer Covid-19 Vaccine Bivalent Booster 76yrs & up 11/15/2021   Pneumococcal Polysaccharide-23 07/18/2009, 08/16/2014, 01/20/2019   Tdap 12/22/2015   Consider Prevnar 20 vaccine.  Shingles vaccine:  I recommend you have a shingles vaccine to help prevent shingles or herpes zoster outbreak.   Please call your insurer to inquire about coverage for the Shingrix vaccine given in 2 doses.   Some insurers cover this vaccine after age 23, some cover this after age 10.  If your insurer covers this, then call to schedule appointment to have this vaccine here.   Screening for cancer: Breast cancer screening: You should perform a self breast exam monthly.   We reviewed recommendations for regular mammograms and breast cancer screening.  Colon cancer screening:  I reviewed your colonoscopy on file that is up to date from 03/2019  Cervical cancer screening: We reviewed recommendations for pap smear screening.  I reviewed 2021 pap results.   Skin cancer screening: Check your skin regularly for new changes, growing lesions, or other lesions of concern Come in for evaluation if you have skin lesions of concern.  Lung cancer screening: If you have a greater than 30 pack year history of tobacco use, then you qualify for lung cancer screening with a chest CT scan  We currently don't have screenings  for other cancers besides breast, cervical, colon, and lung cancers.  If you have a strong family history of cancer or have other cancer screening concerns, please let me know.     Bone health: Get at least 150 minutes of aerobic exercise weekly Get weight bearing exercise at least once weekly Bone density test normal 03/2020   Heart health: Get at least 150 minutes of aerobic exercise weekly Limit alcohol It is important to maintain a healthy blood pressure and healthy cholesterol numbers  Consider referral for CT coronary calcium score screen    Separate significant issues discussed: Vitamin D deficiency-continue supplement  HIV disease-managed by infectious disease  Obesity, insulin resistance-currently seeing weight management clinic.  Starting back on Wegovy soon, consider consult with bariatric surgery   Seasonal allergies -no recent complaints  Hypercholesterolemia- reviewed labs from a few months ago, low HDL, counseled on lifestyle, exercise, consider CT coronary score test  Venous insufficiency, leg asymmetry- no specific lymphedema/findings but I suspect more of a combination of lipedema and normal variation, consider venous study through interventional radiology  Bony sclerosis evaluated 2020.  Evaluated with mammogram, CT abdomen pelvis, evaluation included SPEP with no M spike, bone density test.  Dr. Irene Limbo with hematology oncology advised she continue vitamin D supplement and return in as needed basis  Faustina was seen today for fasting cpe.  Diagnoses and all orders for this visit:  Routine general medical examination at a health care facility -     Alkaline Phosphatase, Isoenzymes -     EKG 12-Lead -     Microalbumin/Creatinine Ratio, Urine -     Urinalysis, Routine w reflex microscopic  Venous insufficiency  Hepatic steatosis  Insulin resistance  Vitamin D deficiency  Vaccine counseling  Seasonal allergies  S/P appendectomy  Other  hyperlipidemia  Human immunodeficiency virus (HIV) disease (HCC)  Estrogen deficiency  Class 3 severe obesity due to excess calories with serious comorbidity and body mass index (BMI) of 45.0 to 49.9 in adult [facility])  Rosacea  Screening for heart disease -     EKG 12-Lead  Elevated serum creatinine -     Microalbumin/Creatinine Ratio, Urine -     Urinalysis, Routine w reflex microscopic  Alkaline phosphatase elevation -     Alkaline Phosphatase, Isoenzymes     Follow-up pending labs, yearly for physical

## 2022-01-25 ENCOUNTER — Encounter: Payer: Self-pay | Admitting: Internal Medicine

## 2022-01-26 LAB — ALKALINE PHOSPHATASE, ISOENZYMES
Alkaline Phosphatase: 120 IU/L (ref 44–121)
BONE FRACTION: 48 % (ref 14–68)
INTESTINAL FRAC.: 5 % (ref 0–18)
LIVER FRACTION: 47 % (ref 18–85)

## 2022-01-26 LAB — MICROALBUMIN / CREATININE URINE RATIO
Creatinine, Urine: 160 mg/dL
Microalb/Creat Ratio: 4 mg/g creat (ref 0–29)
Microalbumin, Urine: 5.7 ug/mL

## 2022-01-26 LAB — URINALYSIS, ROUTINE W REFLEX MICROSCOPIC
Bilirubin, UA: NEGATIVE
Glucose, UA: NEGATIVE
Ketones, UA: NEGATIVE
Leukocytes,UA: NEGATIVE
Nitrite, UA: NEGATIVE
Protein,UA: NEGATIVE
RBC, UA: NEGATIVE
Specific Gravity, UA: 1.022 (ref 1.005–1.030)
Urobilinogen, Ur: 0.2 mg/dL (ref 0.2–1.0)
pH, UA: 6 (ref 5.0–7.5)

## 2022-02-13 ENCOUNTER — Other Ambulatory Visit: Payer: Self-pay

## 2022-02-13 ENCOUNTER — Ambulatory Visit (INDEPENDENT_AMBULATORY_CARE_PROVIDER_SITE_OTHER): Payer: BC Managed Care – PPO | Admitting: Family Medicine

## 2022-02-13 ENCOUNTER — Encounter (INDEPENDENT_AMBULATORY_CARE_PROVIDER_SITE_OTHER): Payer: Self-pay | Admitting: Family Medicine

## 2022-02-13 VITALS — BP 122/77 | HR 76 | Temp 98.1°F | Ht 63.0 in | Wt 258.0 lb

## 2022-02-13 DIAGNOSIS — E559 Vitamin D deficiency, unspecified: Secondary | ICD-10-CM | POA: Diagnosis not present

## 2022-02-13 DIAGNOSIS — E8881 Metabolic syndrome: Secondary | ICD-10-CM

## 2022-02-13 DIAGNOSIS — E669 Obesity, unspecified: Secondary | ICD-10-CM | POA: Diagnosis not present

## 2022-02-13 DIAGNOSIS — Z6841 Body Mass Index (BMI) 40.0 and over, adult: Secondary | ICD-10-CM | POA: Diagnosis not present

## 2022-02-13 NOTE — Progress Notes (Signed)
Chief Complaint:   OBESITY Ewa is here to discuss her progress with her obesity treatment plan along with follow-up of her obesity related diagnoses. Danecia is on the Category 3 Plan and states she is following her eating plan approximately 75% of the time. Sylvan states she is doing 0 minutes 0 times per week.  Today's visit was #: 11 Starting weight: 262 lbs Starting date: 11/12/2018 Today's weight: 258 lbs Today's date: 02/13/2022 Total lbs lost to date: 4 lbs Total lbs lost since last in-office visit: 0  Interim History: Amylah has been caring for her dad who had a stroke the end of January.  He lives in [place] and she has been traveling to care for him. She has been eating out quite a bit.  She is still drinking some caloric beverages. Overall she feels she will be able to do better on plan because basketball season ends soon.  She is a middle school Advertising account planner. She is considering weight loss surgery.   Subjective:   1. Vitamin D deficiency Dori's Vitamin D is at goal (61.6). She is on weekly prescription Vitamin D.   Lab Results  Component Value Date   VD25OH 61.6 01/18/2022   VD25OH 49.7 07/06/2021   VD25OH 56.2 01/26/2021    2. Insulin resistance Amber is on Wegovy 2.4 mg weekly. She denies polyphagia. She did not have nausea with restart after last office visit.  Lab Results  Component Value Date   HGBA1C 5.2 01/18/2022   Lab Results  Component Value Date   INSULIN 30.5 (H) 01/18/2022   INSULIN 44.2 (H) 07/06/2021   INSULIN 43.5 (H) 01/26/2021   INSULIN 35.7 (H) 06/06/2020   INSULIN 30.4 (H) 02/16/2020    Assessment/Plan:   1. Vitamin D deficiency Zsofia will continue prescription until it runs out (3 months). She will start over the counter Vitamin D 4000 IU every day after finishing prescription.   2. Insulin resistance Audriella will continue Wegovy 2.4 mg weekly.   3. Obesity, current BMI  45.71 Romaine is currently in the action stage of change. As such, her goal is to continue with weight loss efforts. She has agreed to the Category 3 Plan.   Exercise goals: I encouraged her to start some form of physical activity.  Behavioral modification strategies: decreasing liquid calories, decreasing eating out, and meal planning and cooking strategies.  Shahla has agreed to follow-up with our clinic in 4 weeks.  Objective:   Blood pressure 122/77, pulse 76, temperature 98.1 F (36.7 C), height 5\' 3"  (1.6 m), weight 258 lb (117 kg), SpO2 97 %. Body mass index is 45.7 kg/m.  General: Cooperative, alert, well developed, in no acute distress. HEENT: Conjunctivae and lids unremarkable. Cardiovascular: Regular rhythm.  Lungs: Normal work of breathing. Neurologic: No focal deficits.   Lab Results  Component Value Date   CREATININE 1.11 (H) 01/18/2022   BUN 15 01/18/2022   NA 143 01/18/2022   K 4.5 01/18/2022   CL 107 (H) 01/18/2022   CO2 23 01/18/2022   Lab Results  Component Value Date   ALT 24 01/18/2022   AST 20 01/18/2022   ALKPHOS 120 01/22/2022   BILITOT 0.3 01/18/2022   Lab Results  Component Value Date   HGBA1C 5.2 01/18/2022   HGBA1C 5.2 07/06/2021   HGBA1C 5.3 01/26/2021   HGBA1C 5.3 01/13/2021   HGBA1C 5.4 06/06/2020   Lab Results  Component Value Date   INSULIN 30.5 (H) 01/18/2022  INSULIN 44.2 (H) 07/06/2021   INSULIN 43.5 (H) 01/26/2021   INSULIN 35.7 (H) 06/06/2020   INSULIN 30.4 (H) 02/16/2020   Lab Results  Component Value Date   TSH 2.760 02/16/2020   Lab Results  Component Value Date   CHOL 135 10/30/2021   HDL 41 (L) 10/30/2021   LDLCALC 77 10/30/2021   TRIG 83 10/30/2021   CHOLHDL 3.3 10/30/2021   Lab Results  Component Value Date   VD25OH 61.6 01/18/2022   VD25OH 49.7 07/06/2021   VD25OH 56.2 01/26/2021   Lab Results  Component Value Date   WBC 5.8 10/30/2021   HGB 15.2 10/30/2021   HCT 45.0 10/30/2021   MCV 93.9  10/30/2021   PLT 256 10/30/2021   Lab Results  Component Value Date   IRON 60 01/13/2021   Attestation Statements:   Reviewed by clinician on day of visit: allergies, medications, problem list, medical history, surgical history, family history, social history, and previous encounter notes.  I, Lizbeth Bark, RMA, am acting as Location manager for [employer], [place].  I have reviewed the above documentation for accuracy and completeness, and I agree with the above. -  Georgianne Fick, FNP

## 2022-02-14 ENCOUNTER — Encounter (INDEPENDENT_AMBULATORY_CARE_PROVIDER_SITE_OTHER): Payer: Self-pay | Admitting: Family Medicine

## 2022-03-13 ENCOUNTER — Ambulatory Visit (INDEPENDENT_AMBULATORY_CARE_PROVIDER_SITE_OTHER): Payer: BC Managed Care – PPO | Admitting: Physician Assistant

## 2022-04-02 ENCOUNTER — Ambulatory Visit (INDEPENDENT_AMBULATORY_CARE_PROVIDER_SITE_OTHER): Payer: BC Managed Care – PPO | Admitting: Physician Assistant

## 2022-04-09 ENCOUNTER — Encounter (INDEPENDENT_AMBULATORY_CARE_PROVIDER_SITE_OTHER): Payer: Self-pay | Admitting: Physician Assistant

## 2022-04-11 ENCOUNTER — Ambulatory Visit (INDEPENDENT_AMBULATORY_CARE_PROVIDER_SITE_OTHER): Payer: BC Managed Care – PPO | Admitting: Physician Assistant

## 2022-04-12 ENCOUNTER — Other Ambulatory Visit (INDEPENDENT_AMBULATORY_CARE_PROVIDER_SITE_OTHER): Payer: Self-pay

## 2022-04-12 ENCOUNTER — Encounter (INDEPENDENT_AMBULATORY_CARE_PROVIDER_SITE_OTHER): Payer: Self-pay | Admitting: Physician Assistant

## 2022-04-12 ENCOUNTER — Ambulatory Visit (INDEPENDENT_AMBULATORY_CARE_PROVIDER_SITE_OTHER): Payer: BC Managed Care – PPO | Admitting: Physician Assistant

## 2022-04-12 VITALS — BP 117/78 | HR 77 | Temp 98.1°F | Ht 63.0 in | Wt 268.0 lb

## 2022-04-12 DIAGNOSIS — Z9189 Other specified personal risk factors, not elsewhere classified: Secondary | ICD-10-CM

## 2022-04-12 DIAGNOSIS — E8881 Metabolic syndrome: Secondary | ICD-10-CM

## 2022-04-12 DIAGNOSIS — E559 Vitamin D deficiency, unspecified: Secondary | ICD-10-CM | POA: Diagnosis not present

## 2022-04-12 DIAGNOSIS — Z6841 Body Mass Index (BMI) 40.0 and over, adult: Secondary | ICD-10-CM

## 2022-04-12 DIAGNOSIS — E669 Obesity, unspecified: Secondary | ICD-10-CM

## 2022-04-17 MED ORDER — WEGOVY 2.4 MG/0.75ML ~~LOC~~ SOAJ: 2.4000 mg | SUBCUTANEOUS | 0 refills | Status: DC

## 2022-04-17 NOTE — Telephone Encounter (Signed)
Margaret Porter 

## 2022-04-17 NOTE — Telephone Encounter (Signed)
Last OV with Tracey 

## 2022-04-24 NOTE — Progress Notes (Signed)
? ? ? ?Chief Complaint:  ? ?OBESITY ?Margaret Porter is here to discuss her progress with her obesity treatment plan along with follow-up of her obesity related diagnoses. Margaret Porter is on the Category 3 Plan and states she is following her eating plan approximately 50% of the time. Margaret Porter states she is walking for 30 minutes 7 times per week. ? ?Today's visit was #: 61 ?Starting weight: 262 lbs ?Starting date: 11/12/2018 ?Today's weight: 268 lbs ?Today's date: 04/12/2022 ?Total lbs lost to date: 0 ?Total lbs lost since last in-office visit: 0 ? ?Interim History: Margaret Porter is eating on the road more often going back and forth to [place] taking care of her dad. When home, she may or may not eat breakfast. For lunch and dinner she often eats out. She has been off of Wegovy for about 2 weeks.  ? ?Subjective:  ? ?1. Vitamin D deficiency ?Margaret Porter is currently on Vitamin D. She is taking her medications as prescribed. She denies side effects.  ? ?2. Insulin resistance ?Margaret Porter is currently on Wegovy 2.4 mg.  ? ?3. At risk for impaired metabolic function ?Margaret Porter is at risk for impaired metabolic function due to skipping meals.  ? ?Assessment/Plan:  ? ?1. Vitamin D deficiency ?Low Vitamin D level contributes to fatigue and are associated with obesity, breast, and colon cancer. We will refill prescription Vitamin D 50,000 IU every week for 1 month with no refills and Margaret Porter will follow-up for routine testing of Vitamin D, at least 2-3 times per year to avoid over-replacement. ? ?2. Insulin resistance ?Margaret Porter will continue to work on weight loss, exercise, and decreasing simple carbohydrates to help decrease the risk of diabetes. Margaret Porter agreed to follow-up with [name] as directed to closely monitor her progress. ? ?3. At risk for impaired metabolic function ?Margaret Porter was given approximately 15 minutes of impaired  metabolic function prevention counseling today. We discussed intensive lifestyle modifications today with an emphasis on  specific nutrition and exercise instructions and strategies.  ? ?Repetitive spaced learning was employed today to elicit superior memory formation and behavioral change.  ? ?4. Obesity, current BMI 45.71 ?Margaret Porter is currently in the action stage of change. As such, her goal is to continue with weight loss efforts. She has agreed to change to practicing portion control and making smarter food choices, such as increasing vegetables and decreasing simple carbohydrates.  ? ?We will refill Wegoy 2.4 mg for 1 month with no refills.  ? ?- Semaglutide-Weight Management (WEGOVY) 2.4 MG/0.75ML SOAJ; Inject 2.4 mg into the skin once a week.  Dispense: 3 mL; Refill: 0 ? ?Exercise goals:  As is.  ? ?Behavioral modification strategies: decreasing eating out and no skipping meals. ? ?Margaret Porter has agreed to follow-up with our clinic in 2 weeks. She was informed of the importance of frequent follow-up visits to maximize her success with intensive lifestyle modifications for her multiple health conditions.  ? ?Objective:  ? ?Blood pressure 117/78, pulse 77, temperature 98.1 ?F (36.7 ?C), height '5\' 3"'$  (1.6 m), weight 268 lb (121.6 kg). ?Body mass index is 47.47 kg/m?. ? ?General: Cooperative, alert, well developed, in no acute distress. ?HEENT: Conjunctivae and lids unremarkable. ?Cardiovascular: Regular rhythm.  ?Lungs: Normal work of breathing. ?Neurologic: No focal deficits.  ? ?Lab Results  ?Component Value Date  ? CREATININE 1.11 (H) 01/18/2022  ? BUN 15 01/18/2022  ? NA 143 01/18/2022  ? K 4.5 01/18/2022  ? CL 107 (H) 01/18/2022  ? CO2 23 01/18/2022  ? ?Lab Results  ?Component  Value Date  ? ALT 24 01/18/2022  ? AST 20 01/18/2022  ? ALKPHOS 120 01/22/2022  ? BILITOT 0.3 01/18/2022  ? ?Lab Results  ?Component Value Date  ? HGBA1C 5.2 01/18/2022  ? HGBA1C 5.2 07/06/2021  ? HGBA1C 5.3 01/26/2021  ? HGBA1C 5.3 01/13/2021  ? HGBA1C 5.4 06/06/2020  ? ?Lab Results  ?Component Value Date  ? INSULIN 30.5 (H) 01/18/2022  ? INSULIN 44.2  (H) 07/06/2021  ? INSULIN 43.5 (H) 01/26/2021  ? INSULIN 35.7 (H) 06/06/2020  ? INSULIN 30.4 (H) 02/16/2020  ? ?Lab Results  ?Component Value Date  ? TSH 2.760 02/16/2020  ? ?Lab Results  ?Component Value Date  ? CHOL 135 10/30/2021  ? HDL 41 (L) 10/30/2021  ? [place] 77 10/30/2021  ? TRIG 83 10/30/2021  ? CHOLHDL 3.3 10/30/2021  ? ?Lab Results  ?Component Value Date  ? VD25OH 61.6 01/18/2022  ? VD25OH 49.7 07/06/2021  ? VD25OH 56.2 01/26/2021  ? ?Lab Results  ?Component Value Date  ? WBC 5.8 10/30/2021  ? HGB 15.2 10/30/2021  ? HCT 45.0 10/30/2021  ? MCV 93.9 10/30/2021  ? PLT 256 10/30/2021  ? ?Lab Results  ?Component Value Date  ? IRON 60 01/13/2021  ? ?Attestation Statements:  ? ?Reviewed by clinician on day of visit: allergies, medications, problem list, medical history, surgical history, family history, social history, and previous encounter notes. ? ?I, Tonye Pearson, am acting as Location manager for [employer], PA-C. ? ?I have reviewed the above documentation for accuracy and completeness, and I agree with the above. Abby Potash, PA-C ? ?

## 2022-05-15 ENCOUNTER — Ambulatory Visit (INDEPENDENT_AMBULATORY_CARE_PROVIDER_SITE_OTHER): Payer: BC Managed Care – PPO | Admitting: Physician Assistant

## 2022-05-30 ENCOUNTER — Encounter (INDEPENDENT_AMBULATORY_CARE_PROVIDER_SITE_OTHER): Payer: Self-pay | Admitting: Family Medicine

## 2022-05-30 ENCOUNTER — Telehealth (INDEPENDENT_AMBULATORY_CARE_PROVIDER_SITE_OTHER): Payer: BC Managed Care – PPO | Admitting: Family Medicine

## 2022-05-30 DIAGNOSIS — E88819 Insulin resistance, unspecified: Secondary | ICD-10-CM

## 2022-05-30 DIAGNOSIS — Z6841 Body Mass Index (BMI) 40.0 and over, adult: Secondary | ICD-10-CM | POA: Diagnosis not present

## 2022-05-30 DIAGNOSIS — E669 Obesity, unspecified: Secondary | ICD-10-CM

## 2022-05-30 DIAGNOSIS — E8881 Metabolic syndrome: Secondary | ICD-10-CM

## 2022-05-30 NOTE — Progress Notes (Signed)
TeleHealth Visit:  This visit was completed with telemedicine (audio/video) technology. Margaret Porter has verbally consented to this TeleHealth visit. The patient is located at home, the provider is located at home. The participants in this visit include the listed provider and patient. The visit was conducted today via MyChart video.  OBESITY Margaret Porter is here to discuss her progress with her obesity treatment plan along with follow-up of her obesity related diagnoses.   Today's visit was # 65 Starting weight: 262 lbs Starting date: 11/12/2018 Weight at last in office visit: 268 lbs on 04/12/22 Total weight loss: 0 lbs at last in office visit on 04/12/22. Today's reported weight: 262 lbs   Nutrition Plan: practicing portion control and making smarter food choices, such as increasing vegetables and decreasing simple carbohydrates.  Hunger is well controlled. Cravings are moderately controlled. Has sweets cravings. Current exercise: none  Interim History: Margaret Porter has been extremely busy with end of year activities at school.  She is a Engineer, drilling.  She has also been traveling often to see her dad (has CHF) in [facility].  She often skips breakfast and then eats out lunch and dinner most days.  She does pack her lunch on Monday because she knows she will not have time to go out to get food. She reports she has lost some weight over the past week. Her last day at school will be June 13 and then she will have summer break.  She hopes to get back to the category 3 plan and start walking for exercise with some friends. Assessment/Plan:  1. Insulin Resistance Margaret Porter has had elevated fasting insulin readings. Goal is HgbA1c < 5.7, fasting insulin closer to 5.   She denies polyphagia. Medication(s): Wegovy 2.4 mg weekly.  Denies side effects. Lab Results  Component Value Date   HGBA1C 5.2 01/18/2022   Lab Results  Component Value Date   INSULIN 30.5 (H) 01/18/2022    INSULIN 44.2 (H) 07/06/2021   INSULIN 43.5 (H) 01/26/2021   INSULIN 35.7 (H) 06/06/2020   INSULIN 30.4 (H) 02/16/2020    Plan Continue Wegovy at 2.4 mg weekly.  2. Obesity: Current BMI 45.71 Margaret Porter is currently in the action stage of change. As such, her goal is to continue with weight loss efforts.  She has agreed to the Category 3 Plan and practicing portion control and making smarter food choices, such as increasing vegetables and decreasing simple carbohydrates.  Start category 3 plan after June 13. Until June 13 she will: 1.  Try to have a protein shake for breakfast 2.  Cook on Sunday nights and have lunch and dinner Monday out of leftovers. 3.  Pick up rotisserie chicken on Tuesday to eat for a few days for dinner.  Exercise goals: Start walking for exercise after June 13.  Behavioral modification strategies: increasing lean protein intake, decreasing simple carbohydrates, decreasing eating out, no skipping meals, and meal planning and cooking strategies.  Margaret Porter has agreed to follow-up with our clinic in 3 weeks.   No orders of the defined types were placed in this encounter.   There are no discontinued medications.   No orders of the defined types were placed in this encounter.     Objective:   VITALS: Per patient if applicable, see vitals. GENERAL: Alert and in no acute distress. CARDIOPULMONARY: No increased WOB. Speaking in clear sentences.  PSYCH: Pleasant and cooperative. Speech normal rate and rhythm. Affect is appropriate. Insight and judgement are appropriate. Attention is focused, linear,  and appropriate.  NEURO: Oriented as arrived to appointment on time with no prompting.   Lab Results  Component Value Date   CREATININE 1.11 (H) 01/18/2022   BUN 15 01/18/2022   NA 143 01/18/2022   K 4.5 01/18/2022   CL 107 (H) 01/18/2022   CO2 23 01/18/2022   Lab Results  Component Value Date   ALT 24 01/18/2022   AST 20 01/18/2022   ALKPHOS 120  01/22/2022   BILITOT 0.3 01/18/2022   Lab Results  Component Value Date   HGBA1C 5.2 01/18/2022   HGBA1C 5.2 07/06/2021   HGBA1C 5.3 01/26/2021   HGBA1C 5.3 01/13/2021   HGBA1C 5.4 06/06/2020   Lab Results  Component Value Date   INSULIN 30.5 (H) 01/18/2022   INSULIN 44.2 (H) 07/06/2021   INSULIN 43.5 (H) 01/26/2021   INSULIN 35.7 (H) 06/06/2020   INSULIN 30.4 (H) 02/16/2020   Lab Results  Component Value Date   TSH 2.760 02/16/2020   Lab Results  Component Value Date   CHOL 135 10/30/2021   HDL 41 (L) 10/30/2021   LDLCALC 77 10/30/2021   TRIG 83 10/30/2021   CHOLHDL 3.3 10/30/2021   Lab Results  Component Value Date   WBC 5.8 10/30/2021   HGB 15.2 10/30/2021   HCT 45.0 10/30/2021   MCV 93.9 10/30/2021   PLT 256 10/30/2021   Lab Results  Component Value Date   IRON 60 01/13/2021   Lab Results  Component Value Date   VD25OH 61.6 01/18/2022   VD25OH 49.7 07/06/2021   VD25OH 56.2 01/26/2021    Attestation Statements:   Reviewed by clinician on day of visit: allergies, medications, problem list, medical history, surgical history, family history, social history, and previous encounter notes.  Time spent on visit including pre-visit chart review, actual OV and post-visit charting and care was 31 minutes.

## 2022-06-20 ENCOUNTER — Encounter (INDEPENDENT_AMBULATORY_CARE_PROVIDER_SITE_OTHER): Payer: BC Managed Care – PPO | Admitting: *Deleted

## 2022-06-20 ENCOUNTER — Ambulatory Visit (INDEPENDENT_AMBULATORY_CARE_PROVIDER_SITE_OTHER): Payer: BC Managed Care – PPO | Admitting: Bariatrics

## 2022-06-20 ENCOUNTER — Encounter: Payer: Self-pay | Admitting: Internal Medicine

## 2022-06-20 ENCOUNTER — Encounter (INDEPENDENT_AMBULATORY_CARE_PROVIDER_SITE_OTHER): Payer: Self-pay | Admitting: Bariatrics

## 2022-06-20 ENCOUNTER — Other Ambulatory Visit: Payer: Self-pay

## 2022-06-20 VITALS — BP 119/86 | HR 76 | Temp 98.5°F | Wt 260.5 lb

## 2022-06-20 VITALS — BP 113/77 | HR 84 | Temp 97.3°F | Ht 63.0 in | Wt 256.0 lb

## 2022-06-20 DIAGNOSIS — Z006 Encounter for examination for normal comparison and control in clinical research program: Secondary | ICD-10-CM

## 2022-06-20 DIAGNOSIS — E559 Vitamin D deficiency, unspecified: Secondary | ICD-10-CM | POA: Diagnosis not present

## 2022-06-20 DIAGNOSIS — E669 Obesity, unspecified: Secondary | ICD-10-CM

## 2022-06-20 DIAGNOSIS — Z6841 Body Mass Index (BMI) 40.0 and over, adult: Secondary | ICD-10-CM | POA: Diagnosis not present

## 2022-06-20 DIAGNOSIS — E8881 Metabolic syndrome: Secondary | ICD-10-CM

## 2022-06-20 MED ORDER — WEGOVY 2.4 MG/0.75ML ~~LOC~~ SOAJ
2.4000 mg | SUBCUTANEOUS | 0 refills | Status: DC
Start: 2022-06-20 — End: 2022-08-28

## 2022-06-20 MED ORDER — VITAMIN D (ERGOCALCIFEROL) 1.25 MG (50000 UNIT) PO CAPS: 50000.0000 [IU] | ORAL_CAPSULE | ORAL | 0 refills | Status: DC

## 2022-06-20 NOTE — Research (Signed)
Margaret Porter was here for her week 60 visit for A5321, observational study. She denies any new problems or medications. We discussed a new study that she may be eligible for which is a switch study for people over 50 on [employer], to [place].

## 2022-06-21 LAB — COMPREHENSIVE METABOLIC PANEL
AG Ratio: 2.2 (calc) (ref 1.0–2.5)
ALT: 21 U/L (ref 6–29)
AST: 20 U/L (ref 10–35)
Albumin: 4.4 g/dL (ref 3.6–5.1)
Alkaline phosphatase (APISO): 114 U/L (ref 37–153)
BUN/Creatinine Ratio: 14 (calc) (ref 6–22)
BUN: 15 mg/dL (ref 7–25)
CO2: 24 mmol/L (ref 20–32)
Calcium: 9.1 mg/dL (ref 8.6–10.4)
Chloride: 107 mmol/L (ref 98–110)
Creat: 1.09 mg/dL — ABNORMAL HIGH (ref 0.50–1.03)
Globulin: 2 g/dL (calc) (ref 1.9–3.7)
Glucose, Bld: 96 mg/dL (ref 65–99)
Potassium: 3.8 mmol/L (ref 3.5–5.3)
Sodium: 142 mmol/L (ref 135–146)
Total Bilirubin: 0.5 mg/dL (ref 0.2–1.2)
Total Protein: 6.4 g/dL (ref 6.1–8.1)

## 2022-06-21 LAB — HEPATITIS C ANTIBODY: Hepatitis C Ab: NONREACTIVE

## 2022-06-22 ENCOUNTER — Encounter: Payer: Self-pay | Admitting: *Deleted

## 2022-06-22 LAB — CD4/CD8 (T-HELPER/T-SUPPRESSOR CELL)
CD4 % Helper T Cell: 47.9
CD4 Count: 671
CD8 % Suppressor T Cell: 37.5
CD8 T Cell Abs: 525

## 2022-06-25 ENCOUNTER — Telehealth (INDEPENDENT_AMBULATORY_CARE_PROVIDER_SITE_OTHER): Payer: Self-pay | Admitting: Bariatrics

## 2022-06-25 ENCOUNTER — Encounter (INDEPENDENT_AMBULATORY_CARE_PROVIDER_SITE_OTHER): Payer: Self-pay

## 2022-06-26 ENCOUNTER — Encounter (INDEPENDENT_AMBULATORY_CARE_PROVIDER_SITE_OTHER): Payer: Self-pay | Admitting: Bariatrics

## 2022-07-04 ENCOUNTER — Encounter: Payer: Self-pay | Admitting: *Deleted

## 2022-07-04 LAB — HIV-1 RNA QUANT-NO REFLEX-BLD: HIV-1 RNA Viral Load: <40

## 2022-07-10 NOTE — Progress Notes (Signed)
TeleHealth Visit:  This visit was completed with telemedicine (audio/video) technology. Tia has verbally consented to this TeleHealth visit. The patient is located at home, the provider is located at home. The participants in this visit include the listed provider and patient. The visit was conducted today via MyChart video.  OBESITY Margaret Porter is here to discuss her progress with her obesity treatment plan along with follow-up of her obesity related diagnoses.   Today's visit was # 23 Starting weight: 262 lbs Starting date: 11/12/2018 Weight at last in office visit: 256 lbs on 06/20/22 Total weight loss: 6 lbs at last in office visit on 06/20/22. Today's reported weight: No weight reported.   Nutrition Plan: the Category 3 Plan and practicing portion control and making smarter food choices, such as increasing vegetables and decreasing simple carbohydrates.   Current exercise: none  Interim History: Zitlali has barely been at home this summer.  She has been in [place] to visit a friend whose mother is ill and [place] for a conference.  She has not been following a plan.  So far she has been unable to walk with her friends as she had planned for the summer. She is skipping meals less-mostly because she is with people who eat regularly.  She reports increased intake of sugar sweetened beverages.    Reports her dad is doing better-he has CHF and lives in [place].  33 year old brother just found out he is having a baby with his girlfriend so her family is excited about that.   Assessment/Plan:  1. Insulin Resistance Last fasting insulin elevated at 30.  A1c at goal-5.2.  Has been in prediabetic range in the past.  She has strong family history of diabetes. She denies polyphagia. Medication(s): Wegovy 2.4 mg weekly.  Tolerating well. Lab Results  Component Value Date   HGBA1C 5.2 01/18/2022   Lab Results  Component Value Date   INSULIN 30.5 (H) 01/18/2022    INSULIN 44.2 (H) 07/06/2021   INSULIN 43.5 (H) 01/26/2021   INSULIN 35.7 (H) 06/06/2020   INSULIN 30.4 (H) 02/16/2020    Plan Continue Wegovy 2.4 mg weekly.   2. Obesity: Current BMI 45.4 Charae is currently in the action stage of change. As such, her goal is to continue with weight loss efforts.  She has agreed to the Category 3 Plan.   Exercise goals: She plans on walking with her friends a few times per week.  Behavioral modification strategies: increasing lean protein intake, decreasing simple carbohydrates, increasing water intake, decreasing liquid calories, decreasing eating out, keeping healthy foods in the home, and planning for success.  Narcissa has agreed to follow-up with our clinic in 3 weeks.   No orders of the defined types were placed in this encounter.   There are no discontinued medications.   No orders of the defined types were placed in this encounter.     Objective:   VITALS: Per patient if applicable, see vitals. GENERAL: Alert and in no acute distress. CARDIOPULMONARY: No increased WOB. Speaking in clear sentences.  PSYCH: Pleasant and cooperative. Speech normal rate and rhythm. Affect is appropriate. Insight and judgement are appropriate. Attention is focused, linear, and appropriate.  NEURO: Oriented as arrived to appointment on time with no prompting.   Lab Results  Component Value Date   CREATININE 1.09 (H) 06/20/2022   BUN 15 06/20/2022   NA 142 06/20/2022   K 3.8 06/20/2022   CL 107 06/20/2022   CO2 24 06/20/2022   Lab Results  Component Value Date   ALT 21 06/20/2022   AST 20 06/20/2022   ALKPHOS 120 01/22/2022   BILITOT 0.5 06/20/2022   Lab Results  Component Value Date   HGBA1C 5.2 01/18/2022   HGBA1C 5.2 07/06/2021   HGBA1C 5.3 01/26/2021   HGBA1C 5.3 01/13/2021   HGBA1C 5.4 06/06/2020   Lab Results  Component Value Date   INSULIN 30.5 (H) 01/18/2022   INSULIN 44.2 (H) 07/06/2021   INSULIN 43.5 (H) 01/26/2021    INSULIN 35.7 (H) 06/06/2020   INSULIN 30.4 (H) 02/16/2020   Lab Results  Component Value Date   TSH 2.760 02/16/2020   Lab Results  Component Value Date   CHOL 135 10/30/2021   HDL 41 (L) 10/30/2021   LDLCALC 77 10/30/2021   TRIG 83 10/30/2021   CHOLHDL 3.3 10/30/2021   Lab Results  Component Value Date   WBC 5.8 10/30/2021   HGB 15.2 10/30/2021   HCT 45.0 10/30/2021   MCV 93.9 10/30/2021   PLT 256 10/30/2021   Lab Results  Component Value Date   IRON 60 01/13/2021   Lab Results  Component Value Date   VD25OH 61.6 01/18/2022   VD25OH 49.7 07/06/2021   VD25OH 56.2 01/26/2021    Attestation Statements:   Reviewed by clinician on day of visit: allergies, medications, problem list, medical history, surgical history, family history, social history, and previous encounter notes.  Time spent on visit including the items listed below was 25 minutes.  -preparing to see the patient (e.g., review of tests, history, previous notes) -obtaining and/or reviewing separately obtained history -counseling and educating the patient/family/caregiver -documenting clinical information in the electronic or other health record

## 2022-07-11 ENCOUNTER — Encounter (INDEPENDENT_AMBULATORY_CARE_PROVIDER_SITE_OTHER): Payer: Self-pay | Admitting: Family Medicine

## 2022-07-11 ENCOUNTER — Telehealth (INDEPENDENT_AMBULATORY_CARE_PROVIDER_SITE_OTHER): Payer: BC Managed Care – PPO | Admitting: Family Medicine

## 2022-07-11 DIAGNOSIS — Z6841 Body Mass Index (BMI) 40.0 and over, adult: Secondary | ICD-10-CM

## 2022-07-11 DIAGNOSIS — E8881 Metabolic syndrome: Secondary | ICD-10-CM

## 2022-07-11 DIAGNOSIS — E88819 Insulin resistance, unspecified: Secondary | ICD-10-CM

## 2022-07-11 DIAGNOSIS — E669 Obesity, unspecified: Secondary | ICD-10-CM

## 2022-07-11 DIAGNOSIS — Z7985 Long-term (current) use of injectable non-insulin antidiabetic drugs: Secondary | ICD-10-CM | POA: Diagnosis not present

## 2022-07-29 ENCOUNTER — Encounter (INDEPENDENT_AMBULATORY_CARE_PROVIDER_SITE_OTHER): Payer: Self-pay | Admitting: Bariatrics

## 2022-07-30 NOTE — Telephone Encounter (Signed)
Patient has been r/s  

## 2022-08-01 ENCOUNTER — Ambulatory Visit (INDEPENDENT_AMBULATORY_CARE_PROVIDER_SITE_OTHER): Payer: BC Managed Care – PPO | Admitting: Bariatrics

## 2022-08-08 ENCOUNTER — Encounter (INDEPENDENT_AMBULATORY_CARE_PROVIDER_SITE_OTHER): Payer: Self-pay

## 2022-08-13 ENCOUNTER — Encounter (INDEPENDENT_AMBULATORY_CARE_PROVIDER_SITE_OTHER): Payer: Self-pay | Admitting: Bariatrics

## 2022-08-13 ENCOUNTER — Ambulatory Visit (INDEPENDENT_AMBULATORY_CARE_PROVIDER_SITE_OTHER): Payer: BC Managed Care – PPO | Admitting: Bariatrics

## 2022-08-13 VITALS — BP 123/85 | HR 84 | Temp 98.0°F | Ht 63.0 in | Wt 265.0 lb

## 2022-08-13 DIAGNOSIS — Z6841 Body Mass Index (BMI) 40.0 and over, adult: Secondary | ICD-10-CM

## 2022-08-13 DIAGNOSIS — E559 Vitamin D deficiency, unspecified: Secondary | ICD-10-CM | POA: Diagnosis not present

## 2022-08-13 DIAGNOSIS — E88819 Insulin resistance, unspecified: Secondary | ICD-10-CM

## 2022-08-13 DIAGNOSIS — E669 Obesity, unspecified: Secondary | ICD-10-CM

## 2022-08-13 DIAGNOSIS — E8881 Metabolic syndrome: Secondary | ICD-10-CM | POA: Diagnosis not present

## 2022-08-16 ENCOUNTER — Encounter (INDEPENDENT_AMBULATORY_CARE_PROVIDER_SITE_OTHER): Payer: Self-pay | Admitting: Family Medicine

## 2022-08-16 NOTE — Progress Notes (Signed)
Chief Complaint:   OBESITY Margaret Porter is here to discuss her progress with her obesity treatment plan along with follow-up of her obesity related diagnoses. Margaret Porter is on the Category 3 Plan and states she is following her eating plan approximately 3% of the time. Margaret Porter states she is walking for 30 minutes 3 times per week.  Today's visit was #: 73 Starting weight: 262 lbs Starting date: 11/12/2018 Today's weight: 265 lbs Today's date: 08/13/22 Total lbs lost to date: 0 Total lbs lost since last in-office visit: +9  Interim History: She is up 9 pounds since her last visit.  Her last appointment was on 07/11/2022 (video) with Jake Bathe, NP.  She last saw me June 2023.  She has had multiple stressors.  Subjective:   1. Insulin resistance Wegovy (stopped about 2 weeks ago)  2. Vitamin D deficiency Taking vitamin D.  Assessment/Plan:   1. Insulin resistance Will not refill Wegovy.  2. Vitamin D deficiency Continue vitamin D.  3. Obesity, current BMI 47.0 1.  Meal planning 2.  Intentional eating 3.  Will keep water and protein high 4.  We will get back into a routine  Margaret Porter is currently in the action stage of change. As such, her goal is to continue with weight loss efforts. She has agreed to the Category 3 Plan.   Exercise goals: Walking and will continue.  Behavioral modification strategies: increasing lean protein intake, decreasing simple carbohydrates, increasing vegetables, increasing water intake, decreasing eating out, no skipping meals, meal planning and cooking strategies, keeping healthy foods in the home, and planning for success.  Margaret Porter has agreed to follow-up with our clinic in 4 weeks with [facility] or Dr. Valetta Close. She was informed of the importance of frequent follow-up visits to maximize her success with intensive lifestyle modifications for her multiple health conditions.    Objective:   Blood pressure 123/85, pulse 84, temperature 98 F  (36.7 C), height '5\' 3"'$  (1.6 m), weight 265 lb (120.2 kg), SpO2 97 %. Body mass index is 46.94 kg/m.  General: Cooperative, alert, well developed, in no acute distress. HEENT: Conjunctivae and lids unremarkable. Cardiovascular: Regular rhythm.  Lungs: Normal work of breathing. Neurologic: No focal deficits.   Lab Results  Component Value Date   CREATININE 1.09 (H) 06/20/2022   BUN 15 06/20/2022   NA 142 06/20/2022   K 3.8 06/20/2022   CL 107 06/20/2022   CO2 24 06/20/2022   Lab Results  Component Value Date   ALT 21 06/20/2022   AST 20 06/20/2022   ALKPHOS 120 01/22/2022   BILITOT 0.5 06/20/2022   Lab Results  Component Value Date   HGBA1C 5.2 01/18/2022   HGBA1C 5.2 07/06/2021   HGBA1C 5.3 01/26/2021   HGBA1C 5.3 01/13/2021   HGBA1C 5.4 06/06/2020   Lab Results  Component Value Date   INSULIN 30.5 (H) 01/18/2022   INSULIN 44.2 (H) 07/06/2021   INSULIN 43.5 (H) 01/26/2021   INSULIN 35.7 (H) 06/06/2020   INSULIN 30.4 (H) 02/16/2020   Lab Results  Component Value Date   TSH 2.760 02/16/2020   Lab Results  Component Value Date   CHOL 135 10/30/2021   HDL 41 (L) 10/30/2021   LDLCALC 77 10/30/2021   TRIG 83 10/30/2021   CHOLHDL 3.3 10/30/2021   Lab Results  Component Value Date   VD25OH 61.6 01/18/2022   VD25OH 49.7 07/06/2021   VD25OH 56.2 01/26/2021   Lab Results  Component Value Date   WBC 5.8  10/30/2021   HGB 15.2 10/30/2021   HCT 45.0 10/30/2021   MCV 93.9 10/30/2021   PLT 256 10/30/2021   Lab Results  Component Value Date   IRON 60 01/13/2021    Attestation Statements:   Reviewed by clinician on day of visit: allergies, medications, problem list, medical history, surgical history, family history, social history, and previous encounter notes. I, Dawn Whitmire, FNP-C, am acting as transcriptionist for Dr. Jearld Lesch.  I have reviewed the above documentation for accuracy and completeness, and I agree with the above. Jearld Lesch, DO

## 2022-08-20 ENCOUNTER — Encounter (INDEPENDENT_AMBULATORY_CARE_PROVIDER_SITE_OTHER): Payer: Self-pay | Admitting: Bariatrics

## 2022-08-22 ENCOUNTER — Telehealth (INDEPENDENT_AMBULATORY_CARE_PROVIDER_SITE_OTHER): Payer: BC Managed Care – PPO | Admitting: Family Medicine

## 2022-08-27 NOTE — Progress Notes (Unsigned)
TeleHealth Visit:  This visit was completed with telemedicine (audio/video) technology. Margaret Porter has verbally consented to this TeleHealth visit. The patient is located at home, the provider is located at home. The participants in this visit include the listed provider and patient. The visit was conducted today via MyChart video.  OBESITY Margaret Porter is here to discuss her progress with her obesity treatment plan along with follow-up of her obesity related diagnoses.   Today's visit was # 75 Starting weight: 262 lbs Starting date: 11/12/2018 Weight at last in office visit: 265 lbs on 08/13/22 Total weight loss: 0 lbs at last in office visit on 08/13/22. Today's reported weight: No weight reported.  Nutrition Plan: the Category 3 Plan.  Hunger is moderately controlled. Cravings are moderately controlled.  Current exercise: walking some with a friend- a little out of routine.  Interim History: Margaret Porter says she is able to adhere to the category 3 plan fairly well at lunch and dinner.  A few days per week she eats out for breakfast or has a muffin. She is limiting sugar sweetened beverages and mostly drinking water or fair life milk. She ate out quite a bit last week-the first week of school.  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  She has been packing her lunch this week.  She is doing much better with meal skipping and is now eating regular meals even on the weekends.  She has a tendency to skip meals. Her mom is currently staying with her and has been cooking dinner.  She decided to stop taking the [facility] because after being off of it briefly she noticed she was extremely hungry and regained weight.  She feels her hunger is moderately well controlled, somewhat better than after she first stopped the [facility].  She had gained 9 pounds at her last office visit.  Assessment/Plan:  1. Vitamin D Deficiency Vitamin D is at goal of 50.  She is on weekly prescription Vitamin D 50,000 IU.   Lab Results  Component Value Date   VD25OH 61.6 01/18/2022   VD25OH 49.7 07/06/2021   VD25OH 56.2 01/26/2021    Plan: Refill prescription vitamin D 50,000 IU weekly. Recheck vitamin D in near future.  2. Insulin Resistance A1C is normal at 5.2.  Last fasting insulin was elevated at 30.5.  All of her fasting insulin's has been above 30. She reports  polyphagia. Medication(s): None currently.  She discontinued the [facility] because she did not like the rebound hunger when she went off of it. Lab Results  Component Value Date   HGBA1C 5.2 01/18/2022   Lab Results  Component Value Date   INSULIN 30.5 (H) 01/18/2022   INSULIN 44.2 (H) 07/06/2021   INSULIN 43.5 (H) 01/26/2021   INSULIN 35.7 (H) 06/06/2020   INSULIN 30.4 (H) 02/16/2020    Plan Continue off of Wegovy. Consider restarting metformin next office visit. Recheck labs in the near future.   3. Obesity: Current BMI 54 Margaret Porter is currently in the action stage of change. As such, her goal is to continue with weight loss efforts.  She has agreed to the Category 3 Plan.   1.  Limit eating out at breakfast 2.  Restart regular walking regimen with her friend. 3.  Continue to pack lunch for work. 4.  May have protein shake for breakfast.  Exercise goals: Resume regular walking regimen with her friend.  Behavioral modification strategies: increasing lean protein intake, decreasing simple carbohydrates, decreasing liquid calories, decreasing eating out, no skipping meals,  and planning for success.  Margaret Porter has agreed to follow-up with our clinic in 3 weeks with Dr. Valetta Close.   No orders of the defined types were placed in this encounter.   Medications Discontinued During This Encounter  Medication Reason   Semaglutide-Weight Management (WEGOVY) 2.4 MG/0.75ML SOAJ Patient Preference   Vitamin D, Ergocalciferol, (DRISDOL) 1.25 MG (50000 UNIT) CAPS capsule Reorder     Meds ordered this encounter  Medications   Vitamin  D, Ergocalciferol, (DRISDOL) 1.25 MG (50000 UNIT) CAPS capsule    Sig: Take 1 capsule (50,000 Units total) by mouth every 7 (seven) days.    Dispense:  12 capsule    Refill:  0    Order Specific Question:   Supervising Provider    Answer:   Dennard Nip D [[id number]]      Objective:   VITALS: Per patient if applicable, see vitals. GENERAL: Alert and in no acute distress. CARDIOPULMONARY: No increased WOB. Speaking in clear sentences.  PSYCH: Pleasant and cooperative. Speech normal rate and rhythm. Affect is appropriate. Insight and judgement are appropriate. Attention is focused, linear, and appropriate.  NEURO: Oriented as arrived to appointment on time with no prompting.   Lab Results  Component Value Date   CREATININE 1.09 (H) 06/20/2022   BUN 15 06/20/2022   NA 142 06/20/2022   K 3.8 06/20/2022   CL 107 06/20/2022   CO2 24 06/20/2022   Lab Results  Component Value Date   ALT 21 06/20/2022   AST 20 06/20/2022   ALKPHOS 120 01/22/2022   BILITOT 0.5 06/20/2022   Lab Results  Component Value Date   HGBA1C 5.2 01/18/2022   HGBA1C 5.2 07/06/2021   HGBA1C 5.3 01/26/2021   HGBA1C 5.3 01/13/2021   HGBA1C 5.4 06/06/2020   Lab Results  Component Value Date   INSULIN 30.5 (H) 01/18/2022   INSULIN 44.2 (H) 07/06/2021   INSULIN 43.5 (H) 01/26/2021   INSULIN 35.7 (H) 06/06/2020   INSULIN 30.4 (H) 02/16/2020   Lab Results  Component Value Date   TSH 2.760 02/16/2020   Lab Results  Component Value Date   CHOL 135 10/30/2021   HDL 41 (L) 10/30/2021   LDLCALC 77 10/30/2021   TRIG 83 10/30/2021   CHOLHDL 3.3 10/30/2021   Lab Results  Component Value Date   WBC 5.8 10/30/2021   HGB 15.2 10/30/2021   HCT 45.0 10/30/2021   MCV 93.9 10/30/2021   PLT 256 10/30/2021   Lab Results  Component Value Date   IRON 60 01/13/2021   Lab Results  Component Value Date   VD25OH 61.6 01/18/2022   VD25OH 49.7 07/06/2021   VD25OH 56.2 01/26/2021    Attestation Statements:    Reviewed by clinician on day of visit: allergies, medications, problem list, medical history, surgical history, family history, social history, and previous encounter notes.

## 2022-08-28 ENCOUNTER — Encounter (INDEPENDENT_AMBULATORY_CARE_PROVIDER_SITE_OTHER): Payer: Self-pay | Admitting: Family Medicine

## 2022-08-28 ENCOUNTER — Telehealth (INDEPENDENT_AMBULATORY_CARE_PROVIDER_SITE_OTHER): Payer: BC Managed Care – PPO | Admitting: Family Medicine

## 2022-08-28 DIAGNOSIS — E669 Obesity, unspecified: Secondary | ICD-10-CM | POA: Diagnosis not present

## 2022-08-28 DIAGNOSIS — E88819 Insulin resistance, unspecified: Secondary | ICD-10-CM

## 2022-08-28 DIAGNOSIS — Z6841 Body Mass Index (BMI) 40.0 and over, adult: Secondary | ICD-10-CM | POA: Diagnosis not present

## 2022-08-28 DIAGNOSIS — E8881 Metabolic syndrome: Secondary | ICD-10-CM | POA: Diagnosis not present

## 2022-08-28 DIAGNOSIS — E559 Vitamin D deficiency, unspecified: Secondary | ICD-10-CM

## 2022-08-28 MED ORDER — VITAMIN D (ERGOCALCIFEROL) 1.25 MG (50000 UNIT) PO CAPS: 50000.0000 [IU] | ORAL_CAPSULE | ORAL | 0 refills | Status: DC

## 2022-09-05 ENCOUNTER — Encounter: Payer: Self-pay | Admitting: Internal Medicine

## 2022-09-14 ENCOUNTER — Encounter (INDEPENDENT_AMBULATORY_CARE_PROVIDER_SITE_OTHER): Payer: Self-pay

## 2022-09-17 ENCOUNTER — Ambulatory Visit (INDEPENDENT_AMBULATORY_CARE_PROVIDER_SITE_OTHER): Payer: BC Managed Care – PPO | Admitting: Family Medicine

## 2022-09-17 NOTE — Telephone Encounter (Signed)
Appointment has been cancelled.

## 2022-09-25 ENCOUNTER — Ambulatory Visit: Payer: BC Managed Care – PPO | Admitting: Family Medicine

## 2022-09-25 ENCOUNTER — Ambulatory Visit
Admission: RE | Admit: 2022-09-25 | Discharge: 2022-09-25 | Disposition: A | Discharge status: Home / Self Care | Payer: BC Managed Care – PPO | Source: Ambulatory Visit | Attending: Family Medicine | Admitting: Family Medicine

## 2022-09-25 VITALS — BP 130/86 | HR 73 | Temp 97.5°F

## 2022-09-25 DIAGNOSIS — Z23 Encounter for immunization: Secondary | ICD-10-CM

## 2022-09-25 DIAGNOSIS — M549 Dorsalgia, unspecified: Secondary | ICD-10-CM | POA: Diagnosis not present

## 2022-09-25 NOTE — Progress Notes (Signed)
   Subjective:    Patient ID: Margaret Porter, female    DOB: 1970/07/11, 52 y.o.   MRN: [id number]  HPI She states that on Thursday she noticed some white lower rib discomfort that was worse with motion.  No history of injury or overuse.  No radiation of the pain.  No numbness, tingling, weakness.  No previous history of injury to this area.  Her mammograms and colonoscopy are up-to-date.  She is HIV positive and has continued on her present medication regimen.   Review of Systems     Objective:   Physical Exam Pain on motion of the right mid back area just distal to the ribs no palpable tenderness.  Lungs are clear to auscultation.  No chest wall pressure to palpation is noted.       Assessment & Plan:  Upper back pain - Plan: DG Chest 2 View, DG Lumbar Spine Complete  Need for influenza vaccination - Plan: Flu Vaccine QUAD 6+ mos PF IM (Fluarix Quad PF), CANCELED: Flu Vaccine QUAD 38moIM (Fluarix, Fluzone & Alfiuria Quad PF) I explained that there is no good reason why she is having pain in this area.  She is up-to-date on her health maintenance and her medications.  I will start with x-rays.  Recommend she take 800 mg of ibuprofen 3 times per day and we will proceed on from there.  She was comfortable with that.

## 2022-10-04 NOTE — Progress Notes (Signed)
TeleHealth Visit:  This visit was completed with telemedicine (audio/video) technology. Margaret Porter has verbally consented to this TeleHealth visit. The patient is located at home, the provider is located at home. The participants in this visit include the listed provider and patient. The visit was conducted today via MyChart video.  OBESITY Margaret Porter is here to discuss her progress with her obesity treatment plan along with follow-up of her obesity related diagnoses.   Today's visit was # 29 Starting weight: 262 lbs Starting date: 11/12/2018 Weight at last in office visit: 265 lbs on 08/13/22 Total weight loss: 0 lbs at last in office visit on 08/13/22. Today's reported weight: 270 lbs   Nutrition Plan: the Category 3 Plan.   Current exercise: no regular exercise  Interim History: Margaret Porter is asking if there other weight loss medications she could try. She was on Wegovy 2.4 mg until a few months ago but felt it stopped working after a while. She did mention after stopping Wegovy she felt hungrier.  The lowest her weight has ever been since she has been seeing [name] is 251 pounds.  Plan adherence is poor. She tends to skip breakfast and eat out for lunch. Cooks dinner some of the time.  She is a Engineer, drilling.   Assessment/Plan:  1. Insulin Resistance [place] has had A1c's in the prediabetic range in the past.  Last A1c was 5.2 on 01/18/2022.  Fasting insulin's have been consistently elevated.  Medication(s): none Lab Results  Component Value Date   HGBA1C 5.2 01/18/2022   Lab Results  Component Value Date   INSULIN 30.5 (H) 01/18/2022   INSULIN 44.2 (H) 07/06/2021   INSULIN 43.5 (H) 01/26/2021   INSULIN 35.7 (H) 06/06/2020   INSULIN 30.4 (H) 02/16/2020    Plan Check labs next visit. Consider restarting metformin.   2. Vitamin D Deficiency Vitamin D is at goal of 50.  Last vitamin D level was 61.6 on 01/18/2022. She is on weekly prescription Vitamin D  50,000 IU.  Lab Results  Component Value Date   VD25OH 61.6 01/18/2022   VD25OH 49.7 07/06/2021   VD25OH 56.2 01/26/2021    Plan: Continue prescription vitamin D 50,000 IU weekly. Check labs next visit.   3. Obesity: Current BMI 45 Margaret Porter is currently in the action stage of change. As such, her goal is to continue with weight loss efforts.  She has agreed to the Category 3 Plan.   Pack lunch consistently. Make a reminder note to herself to take lunch from home. Make better choices when eating out.  Advised that Mancel Parsons is the best antiobesity drug we have currently. Discussed that plan must be followed consistently for success with weight loss.  Exercise goals: She plans to start walking with her friend this week.  Behavioral modification strategies: increasing lean protein intake, decreasing simple carbohydrates, decreasing eating out, no skipping meals, and planning for success.  Margaret Porter has agreed to follow-up with our clinic in 4 weeks.   No orders of the defined types were placed in this encounter.   There are no discontinued medications.   No orders of the defined types were placed in this encounter.     Objective:   VITALS: Per patient if applicable, see vitals. GENERAL: Alert and in no acute distress. CARDIOPULMONARY: No increased WOB. Speaking in clear sentences.  PSYCH: Pleasant and cooperative. Speech normal rate and rhythm. Affect is appropriate. Insight and judgement are appropriate. Attention is focused, linear, and appropriate.  NEURO: Oriented as arrived  to appointment on time with no prompting.   Lab Results  Component Value Date   CREATININE 1.09 (H) 06/20/2022   BUN 15 06/20/2022   NA 142 06/20/2022   K 3.8 06/20/2022   CL 107 06/20/2022   CO2 24 06/20/2022   Lab Results  Component Value Date   ALT 21 06/20/2022   AST 20 06/20/2022   ALKPHOS 120 01/22/2022   BILITOT 0.5 06/20/2022   Lab Results  Component Value Date   HGBA1C 5.2  01/18/2022   HGBA1C 5.2 07/06/2021   HGBA1C 5.3 01/26/2021   HGBA1C 5.3 01/13/2021   HGBA1C 5.4 06/06/2020   Lab Results  Component Value Date   INSULIN 30.5 (H) 01/18/2022   INSULIN 44.2 (H) 07/06/2021   INSULIN 43.5 (H) 01/26/2021   INSULIN 35.7 (H) 06/06/2020   INSULIN 30.4 (H) 02/16/2020   Lab Results  Component Value Date   TSH 2.760 02/16/2020   Lab Results  Component Value Date   CHOL 135 10/30/2021   HDL 41 (L) 10/30/2021   LDLCALC 77 10/30/2021   TRIG 83 10/30/2021   CHOLHDL 3.3 10/30/2021   Lab Results  Component Value Date   WBC 5.8 10/30/2021   HGB 15.2 10/30/2021   HCT 45.0 10/30/2021   MCV 93.9 10/30/2021   PLT 256 10/30/2021   Lab Results  Component Value Date   IRON 60 01/13/2021   Lab Results  Component Value Date   VD25OH 61.6 01/18/2022   VD25OH 49.7 07/06/2021   VD25OH 56.2 01/26/2021    Attestation Statements:   Reviewed by clinician on day of visit: allergies, medications, problem list, medical history, surgical history, family history, social history, and previous encounter notes.  Time spent on visit including the items listed below was 32 minutes.  -preparing to see the patient (e.g., review of tests, history, previous notes) -obtaining and/or reviewing separately obtained history -counseling and educating the patient/family/caregiver -documenting clinical information in the electronic or other health record

## 2022-10-08 ENCOUNTER — Telehealth (INDEPENDENT_AMBULATORY_CARE_PROVIDER_SITE_OTHER): Payer: BC Managed Care – PPO | Admitting: Family Medicine

## 2022-10-08 ENCOUNTER — Encounter (INDEPENDENT_AMBULATORY_CARE_PROVIDER_SITE_OTHER): Payer: Self-pay | Admitting: Family Medicine

## 2022-10-08 DIAGNOSIS — E88819 Insulin resistance, unspecified: Secondary | ICD-10-CM | POA: Diagnosis not present

## 2022-10-08 DIAGNOSIS — E669 Obesity, unspecified: Secondary | ICD-10-CM | POA: Diagnosis not present

## 2022-10-08 DIAGNOSIS — Z6841 Body Mass Index (BMI) 40.0 and over, adult: Secondary | ICD-10-CM

## 2022-10-08 DIAGNOSIS — E559 Vitamin D deficiency, unspecified: Secondary | ICD-10-CM

## 2022-10-09 ENCOUNTER — Encounter: Payer: Self-pay | Admitting: Internal Medicine

## 2022-10-31 ENCOUNTER — Other Ambulatory Visit: Payer: BC Managed Care – PPO

## 2022-10-31 ENCOUNTER — Other Ambulatory Visit: Payer: Self-pay

## 2022-10-31 ENCOUNTER — Encounter (INDEPENDENT_AMBULATORY_CARE_PROVIDER_SITE_OTHER): Payer: BC Managed Care – PPO | Admitting: *Deleted

## 2022-10-31 ENCOUNTER — Encounter: Payer: BC Managed Care – PPO | Admitting: *Deleted

## 2022-10-31 VITALS — BP 130/84 | HR 72 | Temp 98.6°F | Wt 278.0 lb

## 2022-10-31 DIAGNOSIS — Z006 Encounter for examination for normal comparison and control in clinical research program: Secondary | ICD-10-CM

## 2022-10-31 DIAGNOSIS — B20 Human immunodeficiency virus [HIV] disease: Secondary | ICD-10-CM

## 2022-10-31 NOTE — Research (Signed)
Margaret Porter was here for her week 61 visit for A5321. She denies any current problems. She had some lower back issues last month but those have resolved.She will be seeing Dr. Megan Salon in a few weeks and return for study in April.

## 2022-11-01 LAB — T-HELPER CELL (CD4) - (RCID CLINIC ONLY)
CD4 % Helper T Cell: 45 % (ref 33–65)
CD4 T Cell Abs: 558 /uL (ref 400–1790)

## 2022-11-02 LAB — COMPREHENSIVE METABOLIC PANEL
AG Ratio: 2.2 (calc) (ref 1.0–2.5)
ALT: 20 U/L (ref 6–29)
AST: 18 U/L (ref 10–35)
Albumin: 4.2 g/dL (ref 3.6–5.1)
Alkaline phosphatase (APISO): 122 U/L (ref 37–153)
BUN: 12 mg/dL (ref 7–25)
CO2: 28 mmol/L (ref 20–32)
Calcium: 9.1 mg/dL (ref 8.6–10.4)
Chloride: 106 mmol/L (ref 98–110)
Creat: 0.97 mg/dL (ref 0.50–1.03)
Globulin: 1.9 g/dL (calc) (ref 1.9–3.7)
Glucose, Bld: 96 mg/dL (ref 65–99)
Potassium: 4.4 mmol/L (ref 3.5–5.3)
Sodium: 141 mmol/L (ref 135–146)
Total Bilirubin: 0.5 mg/dL (ref 0.2–1.2)
Total Protein: 6.1 g/dL (ref 6.1–8.1)

## 2022-11-02 LAB — LIPID PANEL
Cholesterol: 133 mg/dL (ref <200)
HDL: 39 mg/dL — ABNORMAL LOW (ref >=50)
LDL Cholesterol (Calc): 79 mg/dL (calc)
Non-HDL Cholesterol (Calc): 94 mg/dL (calc) (ref <130)
Total CHOL/HDL Ratio: 3.4 (calc) (ref <5.0)
Triglycerides: 71 mg/dL (ref <150)

## 2022-11-02 LAB — CBC
HCT: 42.8 % (ref 35.0–45.0)
Hemoglobin: 14.5 g/dL (ref 11.7–15.5)
MCH: 32 pg (ref 27.0–33.0)
MCHC: 33.9 g/dL (ref 32.0–36.0)
MCV: 94.5 fL (ref 80.0–100.0)
MPV: 11.4 fL (ref 7.5–12.5)
Platelets: 226 10*3/uL (ref 140–400)
RBC: 4.53 10*6/uL (ref 3.80–5.10)
RDW: 12.3 % (ref 11.0–15.0)
WBC: 4.8 10*3/uL (ref 3.8–10.8)

## 2022-11-02 LAB — HIV-1 RNA QUANT-NO REFLEX-BLD
HIV 1 RNA Quant: NOT DETECTED Copies/mL
HIV-1 RNA Quant, Log: NOT DETECTED Log cps/mL

## 2022-11-02 LAB — RPR: RPR Ser Ql: NONREACTIVE

## 2022-11-07 ENCOUNTER — Encounter (INDEPENDENT_AMBULATORY_CARE_PROVIDER_SITE_OTHER): Payer: Self-pay | Admitting: Family Medicine

## 2022-11-07 ENCOUNTER — Ambulatory Visit (INDEPENDENT_AMBULATORY_CARE_PROVIDER_SITE_OTHER): Payer: BC Managed Care – PPO | Admitting: Family Medicine

## 2022-11-07 VITALS — BP 132/81 | HR 69 | Temp 98.7°F | Ht 63.0 in | Wt 274.0 lb

## 2022-11-07 DIAGNOSIS — Z6841 Body Mass Index (BMI) 40.0 and over, adult: Secondary | ICD-10-CM

## 2022-11-07 DIAGNOSIS — E559 Vitamin D deficiency, unspecified: Secondary | ICD-10-CM | POA: Diagnosis not present

## 2022-11-07 DIAGNOSIS — E88819 Insulin resistance, unspecified: Secondary | ICD-10-CM | POA: Diagnosis not present

## 2022-11-07 DIAGNOSIS — E669 Obesity, unspecified: Secondary | ICD-10-CM | POA: Diagnosis not present

## 2022-11-08 LAB — HEMOGLOBIN A1C
Est. average glucose Bld gHb Est-mCnc: 111 mg/dL
Hgb A1c MFr Bld: 5.5 % (ref 4.8–5.6)

## 2022-11-08 LAB — VITAMIN D 25 HYDROXY (VIT D DEFICIENCY, FRACTURES): Vit D, 25-Hydroxy: 47.2 ng/mL (ref 30.0–100.0)

## 2022-11-08 LAB — INSULIN, RANDOM: INSULIN: 29.2 u[IU]/mL — ABNORMAL HIGH (ref 2.6–24.9)

## 2022-11-14 ENCOUNTER — Other Ambulatory Visit: Payer: Self-pay

## 2022-11-14 ENCOUNTER — Ambulatory Visit: Payer: BC Managed Care – PPO | Admitting: Internal Medicine

## 2022-11-14 ENCOUNTER — Encounter: Payer: Self-pay | Admitting: Internal Medicine

## 2022-11-14 ENCOUNTER — Ambulatory Visit (INDEPENDENT_AMBULATORY_CARE_PROVIDER_SITE_OTHER): Payer: BC Managed Care – PPO

## 2022-11-14 VITALS — BP 131/76 | HR 71 | Resp 16 | Ht 63.0 in | Wt 278.0 lb

## 2022-11-14 DIAGNOSIS — J069 Acute upper respiratory infection, unspecified: Secondary | ICD-10-CM | POA: Insufficient documentation

## 2022-11-14 DIAGNOSIS — Z79899 Other long term (current) drug therapy: Secondary | ICD-10-CM

## 2022-11-14 DIAGNOSIS — Z23 Encounter for immunization: Secondary | ICD-10-CM

## 2022-11-14 DIAGNOSIS — B20 Human immunodeficiency virus [HIV] disease: Secondary | ICD-10-CM | POA: Diagnosis not present

## 2022-11-14 MED ORDER — PITAVASTATIN CALCIUM 4 MG PO TABS: 4.0000 mg | ORAL_TABLET | Freq: Every day | ORAL | 11 refills | Status: DC

## 2022-11-14 NOTE — Progress Notes (Signed)
Patient Active Problem List   Diagnosis Date Noted   Human immunodeficiency virus (HIV) disease ([place]) 01/24/2007    Priority: High   Class 3 severe obesity with serious comorbidity and body mass index (BMI) of 45.0 to 49.9 in adult [facility]) 11/06/2007    Priority: Medium    High risk medication use 11/14/2022   Upper respiratory infection 11/14/2022   Polyphagia 03/09/2021   Hepatic steatosis 02/14/2021   Lipedema 01/13/2021   Venous insufficiency 01/13/2021   Chronic abdominal pain 01/13/2021   Seasonal allergies 05/16/2020   Estrogen deficiency 01/11/2020   Perimenopausal 01/11/2020   Bony sclerosis 02/03/2019   S/P appendectomy 01/20/2019   Vaccine counseling 01/20/2019   Insulin resistance 12/09/2018   Routine general medical examination at a health care facility 12/22/2015   Vitamin D deficiency 12/22/2015   Need for pneumococcal vaccination 12/22/2015   Rosacea 04/22/2012   URINARY INCONTINENCE, URGE 10/24/2010   Other hyperlipidemia 11/06/2007   URTICARIA, IDIOPATHIC 03/06/2007    Patient's Medications  New Prescriptions   PITAVASTATIN CALCIUM 4 MG TABS    Take 1 tablet (4 mg total) by mouth daily.  Previous Medications   BICTEGRAVIR-EMTRICITABINE-TENOFOVIR AF (BIKTARVY) 50-200-25 MG TABS TABLET    Take 1 tablet by mouth daily.   DIPHENHYDRAMINE (BENADRYL) 25 MG TABLET    Take 25 mg by mouth every 6 (six) hours as needed for itching or allergies. Reported on 02/29/2016   HYOSCYAMINE (LEVSIN SL) 0.125 MG SL TABLET    Place 1 tablet (0.125 mg total) under the tongue 3 (three) times daily as needed.   ONDANSETRON (ZOFRAN) 8 MG TABLET    Take 1 tablet (8 mg total) by mouth every 8 (eight) hours as needed for nausea or vomiting.   VITAMIN D, ERGOCALCIFEROL, (DRISDOL) 1.25 MG (50000 UNIT) CAPS CAPSULE    Take 1 capsule (50,000 Units total) by mouth every 7 (seven) days.  Modified Medications   No medications on file  Discontinued Medications   No medications on  file    Subjective: Margaret Porter is in for her routine HIV follow-up visit.  She denies any problems obtaining, taking or tolerating her Biktarvy.  She is still taking it in the evening.  She says that she missed a dose last week when she simply forgot.  She does not think she is missing more than 1 dose per month.  She developed a head cold about 2 weeks ago.  She has not had any fever or shortness of breath but still has cough and sinus congestion.  She is using Tussionex to help her sleep at night and suppress her cough and also using nasal lavage.  She feels like she is slowly getting better.  She has had an annual influenza vaccine but has not had a COVID booster since her visit 1 year ago.  She is entering one of our research studies and plans to switch from [place] to [place] at her next research visit on 12/05/2022.  Review of Systems: Review of Systems  Constitutional:  Negative for fever and weight loss.  HENT:  Positive for congestion. Negative for sore throat.   Respiratory:  Positive for cough and sputum production. Negative for shortness of breath.     Past Medical History:  Diagnosis Date   Allergic rhinitis    Allergy    Food allergy    HIV infection ([place]) 2006   Idiopathic urticaria    Joint pain    Lytic bone  lesions on xray    bone lesions on hip,sternum and spine   Obesity    Pre-diabetes    pt not on meds   Rosacea    Sickle cell anemia (HCC)    Sickle cell trait with mom   Swelling of both lower extremities    per pt, left side is more swollen   Vitamin D deficiency     Social History   Tobacco Use   Smoking status: Never   Smokeless tobacco: Never  Vaping Use   Vaping Use: Never used  Substance Use Topics   Alcohol use: Yes    Alcohol/week: 0.0 standard drinks of alcohol    Comment: occasional (special occasions)   Drug use: No    Family History  Problem Relation Age of Onset   Heart disease Mother        cardiomyopathy related to covid infection    Diabetes Mother    Heart disease Father        CAD, considering pacemaker 2023   Diabetes Father    High blood pressure Father    Arrhythmia Father    Diabetes Brother    Heart disease Paternal Uncle    Heart disease Maternal Grandmother    Cancer Maternal Grandmother        breast   Heart disease Paternal Grandmother    Cancer Paternal Grandmother        breast   Colon cancer Neg Hx    Colon polyps Neg Hx    Esophageal cancer Neg Hx    Rectal cancer Neg Hx    Stomach cancer Neg Hx     Allergies  Allergen Reactions   Molds & Smuts     SOB, chest congestion   Oxycodone     Questionable mouth itching, sweats, but was also simultaneously on Cipro, Flagyl, and Robaxin.   Red Dye     Caused a rash with large amounts   Wellbutrin [Bupropion] Other (See Comments)    Eyes swelled 3 hours after taking one dose   Amoxicillin Rash   Aspirin Anxiety   Sulfamethoxazole-Trimethoprim Rash    Health Maintenance  Topic Date Due   Zoster Vaccines- Shingrix (1 of 2) Never done   COVID-19 Vaccine (5 - Pfizer risk series) 01/10/2022   MAMMOGRAM  12/22/2023   TETANUS/TDAP  12/21/2025   PAP SMEAR-Modifier  10/11/2026   COLONOSCOPY (Pts 45-78yr Insurance coverage will need to be confirmed)  03/08/2029   INFLUENZA VACCINE  Completed   Hepatitis C Screening  Completed   HIV Screening  Completed   HPV VACCINES  Aged Out    Objective:  Vitals:   11/14/22 1122  BP: 131/76  Pulse: 71  Resp: 16  SpO2: 98%  Weight: 278 lb (126.1 kg)  Height: '5\' 3"'$  (1.6 m)   Body mass index is 49.25 kg/m.  Physical Exam Constitutional:      Comments: She sounds congested but is otherwise in no distress.  Cardiovascular:     Rate and Rhythm: Normal rate and regular rhythm.     Heart sounds: No murmur heard. Pulmonary:     Effort: Pulmonary effort is normal.     Breath sounds: Normal breath sounds.  Psychiatric:        Mood and Affect: Mood normal.     Lab Results Lab Results   Component Value Date   WBC 4.8 10/31/2022   HGB 14.5 10/31/2022   HCT 42.8 10/31/2022   MCV 94.5 10/31/2022   PLT  226 10/31/2022    Lab Results  Component Value Date   CREATININE 0.97 10/31/2022   BUN 12 10/31/2022   NA 141 10/31/2022   K 4.4 10/31/2022   CL 106 10/31/2022   CO2 28 10/31/2022    Lab Results  Component Value Date   ALT 20 10/31/2022   AST 18 10/31/2022   ALKPHOS 120 01/22/2022   BILITOT 0.5 10/31/2022    Lab Results  Component Value Date   CHOL 133 10/31/2022   HDL 39 (L) 10/31/2022   LDLCALC 79 10/31/2022   TRIG 71 10/31/2022   CHOLHDL 3.4 10/31/2022   Lab Results  Component Value Date   LABRPR NON-REACTIVE 10/31/2022   HIV 1 RNA Quant  Date Value  10/31/2022 Not Detected Copies/mL  11/15/2021 Not Detected Copies/mL  10/30/2021 <40   HIV-1 RNA Viral Load (no units)  Date Value  06/20/2022 <40  08/04/2019 <40  02/16/2019 <40   CD4 (no units)  Date Value  11/08/2015 550  02/08/2015 443  12/21/2013 475   CD4 T Cell Abs  Date Value  10/31/2022 558 /uL  10/30/2021 673 /uL  06/21/2021 620     Problem List Items Addressed This Visit       High   Human immunodeficiency virus (HIV) disease ([place])    Her infection remains under excellent, long-term control.  She will continue Biktarvy until she enters the switch study and starts Dovato in the next month.  She received her updated COVID-vaccine here today.        Unprioritized   High risk medication use - Primary    ....[name] KitchenMarland KitchenWith new research coming out by way of the [place] study regarding cardiovascular event risk reduction for PLWH, I discussed that over the 8 year trial initiation of statin (pitavastatin) therapy was shown to reduce CV disease by 35% independent of other risk factors.   The 10-year ASCVD risk score (Arnett DK, et al., 2019) is: 3.1%   Values used to calculate the score:     Age: 52 years     Sex: Female     Is Non-Hispanic African American: Yes     Diabetic: No      Tobacco smoker: No     Systolic Blood Pressure: 206 mmHg     Is BP treated: No     HDL Cholesterol: 39 mg/dL     Total Cholesterol: 133 mg/dL  We discussed the patient's 10-year CVD Risk Score to be at least moderately elevated, and would benefit from intervention given HIV+ and > 58 yo.   Will proceed with lower-intensity pitavastatin 4 mg given non-diabetic.         Relevant Medications   Pitavastatin Calcium 4 MG TABS   Upper respiratory infection    She is slowly recovering from her recent mild upper respiratory infection.  I encouraged her to continue symptomatic therapy.         Michel Bickers, MD [facility] for [facility] [phone] pager   [phone] cell 11/14/2022, 11:57 AM

## 2022-11-14 NOTE — Assessment & Plan Note (Signed)
......  With new research coming out by way of the [place] study regarding cardiovascular event risk reduction for PLWH, I discussed that over the 8 year trial initiation of statin (pitavastatin) therapy was shown to reduce CV disease by 35% independent of other risk factors.   The 10-year ASCVD risk score (Arnett DK, et al., 2019) is: 3.1%   Values used to calculate the score:     Age: 52 years     Sex: Female     Is Non-Hispanic African American: Yes     Diabetic: No     Tobacco smoker: No     Systolic Blood Pressure: 820 mmHg     Is BP treated: No     HDL Cholesterol: 39 mg/dL     Total Cholesterol: 133 mg/dL  We discussed the patient's 10-year CVD Risk Score to be at least moderately elevated, and would benefit from intervention given HIV+ and > 62 yo.   Will proceed with lower-intensity pitavastatin 4 mg given non-diabetic.

## 2022-11-14 NOTE — Assessment & Plan Note (Signed)
Her infection remains under excellent, long-term control.  She will continue Biktarvy until she enters the switch study and starts Dovato in the next month.  She received her updated COVID-vaccine here today.

## 2022-11-14 NOTE — Assessment & Plan Note (Signed)
She is slowly recovering from her recent mild upper respiratory infection.  I encouraged her to continue symptomatic therapy.

## 2022-11-19 NOTE — Progress Notes (Signed)
Chief Complaint:   OBESITY Margaret Porter is here to discuss her progress with her obesity treatment plan along with follow-up of her obesity related diagnoses. Margaret Porter is on the Category 3 Plan and states she is following her eating plan approximately 60% of the time. Margaret Porter states she is not exercising.   Today's visit was #: 35 Starting weight: 262 lbs Starting date: 11/12/2018 Today's weight: 274 lbs Today's date: 11/07/2022 Total lbs lost to date: 0 Total lbs lost since last in-office visit: +9 lbs  Interim History: 1st office visit with me, seen previously by Margaret Porter and Margaret Porter. She was on Wegovy, she went off in 06/2022, started taking 12/2020. Was 262 lbs in 01/22, and ended July or August 2023 at 265 lbs. Started 10/2018 at 262 lbs and today at 274.   Subjective:   1. Vitamin D deficiency She is currently taking prescription vitamin D 50,000 IU each week. She denies nausea, vomiting or muscle weakness.  Last Vitamin D level 61.6, on 01/18/2022.   2. Insulin resistance Fasting insulin 30.5 on 01/18/2022.  A1c was 5.2.   Assessment/Plan:   Orders Placed This Encounter  Procedures   Hemoglobin A1c   Insulin, random   VITAMIN D 25 Hydroxy (Vit-D Deficiency, Fractures)    There are no discontinued medications.   No orders of the defined types were placed in this encounter.    1. Vitamin D deficiency Check Vitamin D level today and continue ergocalciferol.  No refill needed today.   - VITAMIN D 25 Hydroxy (Vit-D Deficiency, Fractures)  2. Insulin resistance Check labs today.   - Hemoglobin A1c - Insulin, random  3. Obesity, current BMI 48.5 Margaret Margaret Porter got CBC, CMP, FLP ON 10/31/2022. Obtain labs today and review at next office visit.   Margaret Porter is currently in the action stage of change. As such, her goal is to continue with weight loss efforts. She has agreed to the Category 2 Plan+6 oz protein at lunch.   Exercise goals:  As is.   Behavioral  modification strategies: meal planning and cooking strategies and planning for success.  Margaret Porter has agreed to follow-up with our clinic in 3 weeks. She was informed of the importance of frequent follow-up visits to maximize her success with intensive lifestyle modifications for her multiple health conditions.   Margaret Porter was informed we would discuss her lab results at her next visit unless there is a critical issue that needs to be addressed sooner. Margaret Porter agreed to keep her next visit at the agreed upon time to discuss these results.  Objective:   Blood pressure 132/81, pulse 69, temperature 98.7 F (37.1 C), height '5\' 3"'$  (1.6 m), weight 274 lb (124.3 kg), SpO2 97 %. Body mass index is 48.54 kg/m.  General: Cooperative, alert, well developed, in no acute distress. HEENT: Conjunctivae and lids unremarkable. Cardiovascular: Regular rhythm.  Lungs: Normal work of breathing. Neurologic: No focal deficits.   Lab Results  Component Value Date   CREATININE 0.97 10/31/2022   BUN 12 10/31/2022   NA 141 10/31/2022   K 4.4 10/31/2022   CL 106 10/31/2022   CO2 28 10/31/2022   Lab Results  Component Value Date   ALT 20 10/31/2022   AST 18 10/31/2022   ALKPHOS 120 01/22/2022   BILITOT 0.5 10/31/2022   Lab Results  Component Value Date   HGBA1C 5.5 11/07/2022   HGBA1C 5.2 01/18/2022   HGBA1C 5.2 07/06/2021   HGBA1C 5.3 01/26/2021   HGBA1C 5.3 01/13/2021  Lab Results  Component Value Date   INSULIN 29.2 (H) 11/07/2022   INSULIN 30.5 (H) 01/18/2022   INSULIN 44.2 (H) 07/06/2021   INSULIN 43.5 (H) 01/26/2021   INSULIN 35.7 (H) 06/06/2020   Lab Results  Component Value Date   TSH 2.760 02/16/2020   Lab Results  Component Value Date   CHOL 133 10/31/2022   HDL 39 (L) 10/31/2022   LDLCALC 79 10/31/2022   TRIG 71 10/31/2022   CHOLHDL 3.4 10/31/2022   Lab Results  Component Value Date   VD25OH 47.2 11/07/2022   VD25OH 61.6 01/18/2022   VD25OH 49.7 07/06/2021   Lab  Results  Component Value Date   WBC 4.8 10/31/2022   HGB 14.5 10/31/2022   HCT 42.8 10/31/2022   MCV 94.5 10/31/2022   PLT 226 10/31/2022   Lab Results  Component Value Date   IRON 60 01/13/2021   Attestation Statements:   Reviewed by clinician on day of visit: allergies, medications, problem list, medical history, surgical history, family history, social history, and previous encounter notes.  I, Davy Pique, RMA, am acting as Location manager for [employer], DO.   I have reviewed the above documentation for accuracy and completeness, and I agree with the above. Marjory Sneddon, D.O.  The [place] was signed into law in 2016 which includes the topic of electronic health records.  This provides immediate access to information in MyChart.  This includes consultation notes, operative notes, office notes, lab results and pathology reports.  If you have any questions about what you read please let us know at your next visit so we can discuss your concerns and take corrective action if need be.  We are right here with you.

## 2022-11-27 ENCOUNTER — Other Ambulatory Visit: Payer: Self-pay | Admitting: Internal Medicine

## 2022-11-27 DIAGNOSIS — B20 Human immunodeficiency virus [HIV] disease: Secondary | ICD-10-CM

## 2022-12-03 ENCOUNTER — Encounter (INDEPENDENT_AMBULATORY_CARE_PROVIDER_SITE_OTHER): Payer: Self-pay | Admitting: Family Medicine

## 2022-12-03 NOTE — Progress Notes (Unsigned)
TeleHealth Visit:  This visit was completed with telemedicine (audio/video) technology. [name] has verbally consented to this TeleHealth visit. The patient is located at home, the provider is located at home. The participants in this visit include the listed provider and patient. The visit was conducted today via MyChart video.  OBESITY Margaret Porter is here to discuss her progress with her obesity treatment plan along with follow-up of her obesity related diagnoses.   Today's visit was # 72 Starting weight: 262 lbs Starting date: 11/12/2018 Weight at last in office visit: 274 lbs on 11/07/22 Total weight loss: 0 lbs at last in office visit on 11/07/22. Today's reported weight: *** lbs No weight reported.  Nutrition Plan: the Category 2 Plan 6 oz protein at lunch  Current exercise: {exercise types:16438} none  Interim History: ***  Assessment/Plan:  1. ***  2. ***  3. ***  Obesity: Current BMI *** [name] {CHL AMB IS/IS NOT:210130109} currently in the action stage of change. As such, her goal is to {MWMwtloss#1:210800005}.  She has agreed to {MWMwtlossportion/plan2:23431}.   Exercise goals: {MWM EXERCISE RECS:23473}  Behavioral modification strategies: {MWMwtlossdietstrategies3:23432}.  [name] has agreed to follow-up with our clinic in {NUMBER 1-10:22536} weeks.   No orders of the defined types were placed in this encounter.   There are no discontinued medications.   No orders of the defined types were placed in this encounter.     Objective:   VITALS: Per patient if applicable, see vitals. GENERAL: Alert and in no acute distress. CARDIOPULMONARY: No increased WOB. Speaking in clear sentences.  PSYCH: Pleasant and cooperative. Speech normal rate and rhythm. Affect is appropriate. Insight and judgement are appropriate. Attention is focused, linear, and appropriate.  NEURO: Oriented as arrived to appointment on time with no prompting.   Lab Results   Component Value Date   CREATININE 0.97 10/31/2022   BUN 12 10/31/2022   NA 141 10/31/2022   K 4.4 10/31/2022   CL 106 10/31/2022   CO2 28 10/31/2022   Lab Results  Component Value Date   ALT 20 10/31/2022   AST 18 10/31/2022   ALKPHOS 120 01/22/2022   BILITOT 0.5 10/31/2022   Lab Results  Component Value Date   HGBA1C 5.5 11/07/2022   HGBA1C 5.2 01/18/2022   HGBA1C 5.2 07/06/2021   HGBA1C 5.3 01/26/2021   HGBA1C 5.3 01/13/2021   Lab Results  Component Value Date   INSULIN 29.2 (H) 11/07/2022   INSULIN 30.5 (H) 01/18/2022   INSULIN 44.2 (H) 07/06/2021   INSULIN 43.5 (H) 01/26/2021   INSULIN 35.7 (H) 06/06/2020   Lab Results  Component Value Date   TSH 2.760 02/16/2020   Lab Results  Component Value Date   CHOL 133 10/31/2022   HDL 39 (L) 10/31/2022   LDLCALC 79 10/31/2022   TRIG 71 10/31/2022   CHOLHDL 3.4 10/31/2022   Lab Results  Component Value Date   WBC 4.8 10/31/2022   HGB 14.5 10/31/2022   HCT 42.8 10/31/2022   MCV 94.5 10/31/2022   PLT 226 10/31/2022   Lab Results  Component Value Date   IRON 60 01/13/2021   Lab Results  Component Value Date   VD25OH 47.2 11/07/2022   VD25OH 61.6 01/18/2022   VD25OH 49.7 07/06/2021    Attestation Statements:   Reviewed by clinician on day of visit: allergies, medications, problem list, medical history, surgical history, family history, social history, and previous encounter notes.  ***(delete if time-based billing not used) Time spent on visit including the items  listed below was *** minutes.  -preparing to see the patient (e.g., review of tests, history, previous notes) -obtaining and/or reviewing separately obtained history -counseling and educating the patient/family/caregiver -documenting clinical information in the electronic or other health record -ordering medications, tests, or procedures -independently interpreting results and communicating results to the patient/ family/caregiver -referring  and communicating with other health care professionals  -care coordination

## 2022-12-04 ENCOUNTER — Encounter (INDEPENDENT_AMBULATORY_CARE_PROVIDER_SITE_OTHER): Payer: Self-pay | Admitting: Family Medicine

## 2022-12-04 ENCOUNTER — Telehealth (INDEPENDENT_AMBULATORY_CARE_PROVIDER_SITE_OTHER): Payer: BC Managed Care – PPO | Admitting: Family Medicine

## 2022-12-04 DIAGNOSIS — E88819 Insulin resistance, unspecified: Secondary | ICD-10-CM

## 2022-12-04 DIAGNOSIS — E669 Obesity, unspecified: Secondary | ICD-10-CM

## 2022-12-04 DIAGNOSIS — Z6841 Body Mass Index (BMI) 40.0 and over, adult: Secondary | ICD-10-CM | POA: Diagnosis not present

## 2022-12-04 DIAGNOSIS — E559 Vitamin D deficiency, unspecified: Secondary | ICD-10-CM

## 2022-12-04 MED ORDER — VITAMIN D (ERGOCALCIFEROL) 1.25 MG (50000 UNIT) PO CAPS: 50000.0000 [IU] | ORAL_CAPSULE | ORAL | 0 refills | Status: DC

## 2022-12-05 ENCOUNTER — Encounter (INDEPENDENT_AMBULATORY_CARE_PROVIDER_SITE_OTHER): Payer: Self-pay

## 2022-12-05 ENCOUNTER — Other Ambulatory Visit: Payer: Self-pay

## 2022-12-05 VITALS — BP 125/85 | HR 62 | Temp 97.9°F | Resp 16 | Wt 277.6 lb

## 2022-12-05 DIAGNOSIS — Z006 Encounter for examination for normal comparison and control in clinical research program: Secondary | ICD-10-CM

## 2022-12-06 NOTE — Research (Signed)
Individual seen for Screening Visit for Eyewitness, A Phase 3b, multicenter, single-arm, open-label study evaluating the efficacy, safety, and tolerability of switching to DTG/3TC single tablet regimen administered once daily from a bictegravir/emtricitabine/tenofovir alafenamide single tablet regimen in people living with HIV of atleast 54 years of age who are virologically suppressed. Consent signed by individual prior to any procedures being carried out.  All questions answered. Plan to see again in January 2023 for Entry Visit pending eligibility.

## 2022-12-12 ENCOUNTER — Other Ambulatory Visit: Payer: Self-pay

## 2022-12-12 ENCOUNTER — Encounter: Payer: Self-pay | Admitting: Internal Medicine

## 2022-12-12 ENCOUNTER — Ambulatory Visit (INDEPENDENT_AMBULATORY_CARE_PROVIDER_SITE_OTHER): Payer: BC Managed Care – PPO | Admitting: Internal Medicine

## 2022-12-12 DIAGNOSIS — B20 Human immunodeficiency virus [HIV] disease: Secondary | ICD-10-CM

## 2022-12-12 NOTE — Progress Notes (Signed)
Virtual Visit via Video Note  I connected with Margaret Porter on 12/12/22 at  8:45 AM EST by a video enabled telemedicine application and verified that I am speaking with the correct person using two identifiers.  Location: Patient: Work Provider: [facility]   I discussed the limitations of evaluation and management by telemedicine and the availability of in person appointments. The patient expressed understanding and agreed to proceed.  History of Present Illness: I called and spoke with Margaret Porter today.  She continues to take her [place] but met with our research staff on 12/05/2022 for initial intake and plans on switching to Dovato in January.  She still has a lingering cough following her head cold last month.  She feels like her cough is worse when she is at work.  She used to take Zyrtec for allergies but has not tried taking it recently.  She has not had any fever or shortness of breath.   Observations/Objective:   Assessment and Plan: Her HIV infection has been under excellent, long-term control.  I agree with the plan to enter the switch study and start Dovato in January.  I will arrange follow-up here in 1 year.  I suspect that her lingering cough is multifactorial with parts being due to residual bronchitis following her recent upper respiratory infection and possibly a component of environmental allergies.  Follow Up Instructions: Switch from [place] to Dovato next month Restart Zyrtec Follow-up here in 1 year   I discussed the assessment and treatment plan with the patient. The patient was provided an opportunity to ask questions and all were answered. The patient agreed with the plan and demonstrated an understanding of the instructions.   The patient was advised to call back or seek an in-person evaluation if the symptoms worsen or if the condition fails to improve as anticipated.  I provided 18 minutes of non-face-to-face time during this encounter.   Michel Bickers,  MD

## 2023-01-02 ENCOUNTER — Encounter (INDEPENDENT_AMBULATORY_CARE_PROVIDER_SITE_OTHER): Payer: Self-pay

## 2023-01-02 ENCOUNTER — Other Ambulatory Visit: Payer: Self-pay

## 2023-01-02 VITALS — BP 133/85 | HR 79 | Temp 98.0°F | Resp 16 | Ht 63.0 in | Wt 272.9 lb

## 2023-01-02 DIAGNOSIS — Z006 Encounter for examination for normal comparison and control in clinical research program: Secondary | ICD-10-CM

## 2023-01-02 MED ORDER — STUDY - EYEWITNESS (GSK219516) - GSK3515864 (DOLUTEGRAVIR/LAMIVUDINE) 50-300 MG TABLET (PI-VAN DAM): 1.0000 | ORAL_TABLET | Freq: Every day | ORAL | 0 refills | Status: DC

## 2023-01-02 NOTE — Research (Signed)
Participant seen for Day 1 visit for Eyewitness Study, A Phase 3b, multicenter, single-arm, open-label study evaluating the efficacy, safety, and tolerability of switching to DTG/3TC single tablet regimen administered once daily from a bictegravir/emtricitabine/tenofovir alafenamide single tablet regimen in people living with HIV of atleast 55 years of age who are virologically suppressed. All procedures carried out per protocol. Study medications dispensed. No medical issues/concerns at this time. Plan to see participant again in 4 weeks for next visit.

## 2023-01-03 ENCOUNTER — Ambulatory Visit (INDEPENDENT_AMBULATORY_CARE_PROVIDER_SITE_OTHER): Payer: BC Managed Care – PPO | Admitting: Family Medicine

## 2023-01-04 ENCOUNTER — Other Ambulatory Visit: Payer: Self-pay | Admitting: Internal Medicine

## 2023-01-04 DIAGNOSIS — B20 Human immunodeficiency virus [HIV] disease: Secondary | ICD-10-CM

## 2023-01-04 LAB — HM MAMMOGRAPHY

## 2023-01-08 ENCOUNTER — Encounter: Payer: Self-pay | Admitting: Medical

## 2023-01-23 ENCOUNTER — Ambulatory Visit (INDEPENDENT_AMBULATORY_CARE_PROVIDER_SITE_OTHER): Payer: BC Managed Care – PPO | Admitting: Medical

## 2023-01-23 ENCOUNTER — Encounter: Payer: Self-pay | Admitting: Medical

## 2023-01-23 VITALS — BP 110/70 | HR 80 | Ht 64.0 in | Wt 283.0 lb

## 2023-01-23 DIAGNOSIS — J302 Other seasonal allergic rhinitis: Secondary | ICD-10-CM

## 2023-01-23 DIAGNOSIS — Z0000 Encounter for general adult medical examination without abnormal findings: Secondary | ICD-10-CM | POA: Diagnosis not present

## 2023-01-23 DIAGNOSIS — E2839 Other primary ovarian failure: Secondary | ICD-10-CM

## 2023-01-23 DIAGNOSIS — E88819 Insulin resistance, unspecified: Secondary | ICD-10-CM

## 2023-01-23 DIAGNOSIS — E559 Vitamin D deficiency, unspecified: Secondary | ICD-10-CM | POA: Diagnosis not present

## 2023-01-23 DIAGNOSIS — Z7185 Encounter for immunization safety counseling: Secondary | ICD-10-CM

## 2023-01-23 DIAGNOSIS — Z79899 Other long term (current) drug therapy: Secondary | ICD-10-CM

## 2023-01-23 DIAGNOSIS — I872 Venous insufficiency (chronic) (peripheral): Secondary | ICD-10-CM

## 2023-01-23 DIAGNOSIS — K760 Fatty (change of) liver, not elsewhere classified: Secondary | ICD-10-CM

## 2023-01-23 DIAGNOSIS — E7849 Other hyperlipidemia: Secondary | ICD-10-CM

## 2023-01-23 DIAGNOSIS — N951 Menopausal and female climacteric states: Secondary | ICD-10-CM

## 2023-01-23 DIAGNOSIS — Z9049 Acquired absence of other specified parts of digestive tract: Secondary | ICD-10-CM

## 2023-01-23 DIAGNOSIS — Z6841 Body Mass Index (BMI) 40.0 and over, adult: Secondary | ICD-10-CM

## 2023-01-23 DIAGNOSIS — B20 Human immunodeficiency virus [HIV] disease: Secondary | ICD-10-CM

## 2023-01-23 NOTE — Progress Notes (Signed)
Subjective:   HPI  Margaret Porter is a 53 y.o. female who presents for Chief Complaint  Patient presents with   fasting cpe    Fasting cpe, abdominal pain last night     Patient Care Team: Angelika Jerrett, Camelia Eng, PA-C as PCP - General (Family Medicine) Michel Bickers, MD as PCP - Infectious Diseases (Infectious Diseases) Servando Salina, MD as Consulting Physician (Obstetrics and Gynecology) Georgia Lopes, DO as Consulting Physician [facility]) Sees dentist Sees eye doctor  Concerns: Few days ago had some belching, reflux and constipation.  Resolved now, but wanted to mention this.  Still battles with weight.  Is seeing [facility] ongoing.   Not exercising all that much.     Has used [facility] but did not think it helped.  Has been on metformin but did not like the way she felt on it and is scared to take this.  She does have a workout partner work is encouraging her to exercise with her again  Reviewed their medical, surgical, family, social, medication, and allergy history and updated chart as appropriate.  Past Medical History:  Diagnosis Date   Allergic rhinitis    Allergy    Food allergy    HIV infection ([place]) 2006   Idiopathic urticaria    Joint pain    Lytic bone lesions on xray    bone lesions on hip,sternum and spine   Obesity    Pre-diabetes    pt not on meds   Rosacea    Sickle cell anemia (HCC)    Sickle cell trait with mom   Swelling of both lower extremities    per pt, left side is more swollen   Vitamin D deficiency     Family History  Problem Relation Age of Onset   Heart disease Mother        cardiomyopathy related to covid infection   Diabetes Mother    Heart disease Father        CAD, considering pacemaker 2023   Diabetes Father    High blood pressure Father    Arrhythmia Father    Diabetes Brother    Heart disease Paternal Uncle    Heart disease Maternal Grandmother    Cancer Maternal Grandmother        breast    Heart disease Paternal Grandmother    Cancer Paternal Grandmother        breast   Colon cancer Neg Hx    Colon polyps Neg Hx    Esophageal cancer Neg Hx    Rectal cancer Neg Hx    Stomach cancer Neg Hx      Current Outpatient Medications:    diphenhydrAMINE (BENADRYL) 25 MG tablet, Take 25 mg by mouth every 6 (six) hours as needed for itching or allergies. Reported on 02/29/2016, Disp: , Rfl:    hyoscyamine (LEVSIN SL) 0.125 MG SL tablet, Place 1 tablet (0.125 mg total) under the tongue 3 (three) times daily as needed., Disp: 30 tablet, Rfl: 0   Pitavastatin Calcium 4 MG TABS, Take 1 tablet (4 mg total) by mouth daily., Disp: 30 tablet, Rfl: 11   Study - EYEWITNESS ([id number]) - [id number] (dolutegravir/lamivudine) 50-300 mg tablet ([place]), Take 1 tablet by mouth daily., Disp: 90 tablet, Rfl: 0   Vitamin D, Ergocalciferol, (DRISDOL) 1.25 MG (50000 UNIT) CAPS capsule, Take 1 capsule (50,000 Units total) by mouth every 7 (seven) days., Disp: 12 capsule, Rfl: 0  Allergies  Allergen Reactions  Molds & Smuts     SOB, chest congestion   Oxycodone     Questionable mouth itching, sweats, but was also simultaneously on Cipro, Flagyl, and Robaxin.   Red Dye     Caused a rash with large amounts   Wellbutrin [Bupropion] Other (See Comments)    Eyes swelled 3 hours after taking one dose   Amoxicillin Rash   Aspirin Anxiety   Sulfamethoxazole-Trimethoprim Rash   Review of Systems  Constitutional:  Negative for chills, fever, malaise/fatigue and weight loss.  HENT:  Negative for congestion, ear pain, hearing loss, sore throat and tinnitus.   Eyes:  Negative for blurred vision, pain and redness.  Respiratory:  Negative for cough, hemoptysis and shortness of breath.   Cardiovascular:  Negative for chest pain, palpitations, orthopnea, claudication and leg swelling.  Gastrointestinal:  Positive for abdominal pain and constipation. Negative for blood in stool, diarrhea, nausea and vomiting.   Genitourinary:  Negative for dysuria, flank pain, frequency, hematuria and urgency.  Musculoskeletal:  Negative for falls, joint pain and myalgias.  Skin:  Negative for itching and rash.  Neurological:  Negative for dizziness, tingling, speech change, weakness and headaches.  Endo/Heme/Allergies:  Negative for polydipsia. Does not bruise/bleed easily.  Psychiatric/Behavioral:  Negative for depression and memory loss. The patient is not nervous/anxious and does not have insomnia.         01/23/2023    8:22 AM 12/12/2022    8:30 AM 11/14/2022   11:22 AM 01/22/2022    8:39 AM 10/11/2021    3:50 PM  Depression screen PHQ 2/9  Decreased Interest 0 0 0 0 0  Down, Depressed, Hopeless 0 0 0 0 0  PHQ - 2 Score 0 0 0 0 0       Objective:  BP 110/70   Pulse 80   Ht '5\' 4"'$  (1.626 m)   Wt 283 lb (128.4 kg)   BMI 48.58 kg/m   General appearance: alert, no distress, WD/WN, African American female Skin: unremarkable HEENT: normocephalic, conjunctiva/corneas normal, sclerae anicteric, PERRLA, EOMi, nares patent, no discharge or erythema, pharynx normal Oral cavity: MMM, tongue normal, teeth in good repair Neck: supple, no lymphadenopathy, no thyromegaly, no masses, normal ROM, no bruits Chest: non tender, normal shape and expansion Heart: RRR, normal S1, S2, no murmurs Lungs: CTA bilaterally, no wheezes, rhonchi, or rales Abdomen: +bs, soft, non tender, non distended, no masses, no hepatomegaly, no splenomegaly, no bruits Back: non tender, normal ROM, no scoliosis Musculoskeletal: upper extremities non tender, no obvious deformity, normal ROM throughout, lower extremities non tender, no obvious deformity, normal ROM throughout Extremities: no edema, no cyanosis, no clubbing Pulses: 2+ symmetric, upper and lower extremities, normal cap refill Neurological: alert, oriented x 3, CN2-12 intact, strength normal upper extremities and lower extremities, sensation normal throughout, DTRs 2+  throughout, no cerebellar signs, gait normal Psychiatric: normal affect, behavior normal, pleasant  Breast/gyn/rectal - deferred to gynecology     Assessment and Plan :   Encounter Diagnoses  Name Primary?   Routine general medical examination at a health care facility Yes   Vitamin D deficiency    Vaccine counseling    Seasonal allergies    S/P appendectomy    Venous insufficiency    Hepatic steatosis    Insulin resistance    Class 3 severe obesity due to excess calories with serious comorbidity and body mass index (BMI) of 45.0 to 49.9 in adult [facility])    Estrogen deficiency    High risk  medication use    Human immunodeficiency virus (HIV) disease ([place])    Other hyperlipidemia    Perimenopausal      This visit was a preventative care visit, also known as wellness visit or routine physical.   Topics typically include healthy lifestyle, diet, exercise, preventative care, vaccinations, sick and well care, proper use of emergency dept and after hours care, as well as other concerns.     Recommendations: Continue to return yearly for your annual wellness and preventative care visits.  This gives [name] a chance to discuss healthy lifestyle, exercise, vaccinations, review your chart record, and perform screenings where appropriate.  I recommend you see your eye doctor yearly for routine vision care.  I recommend you see your dentist yearly for routine dental care including hygiene visits twice yearly.  Continue routine follow up with gynecology   Vaccination recommendations were reviewed Immunization History  Administered Date(s) Administered   COVID-19, mRNA, vaccine(Comirnaty)12 years and older 11/14/2022   H1N1 04/12/2009   Hepatitis B 08/31/2004, 10/03/2004, 05/08/2005   Influenza Split 11/21/2011, 10/14/2012   Influenza Whole 11/11/2006, 10/02/2007, 10/11/2009, 10/24/2010   Influenza,inj,Quad PF,6+ Mos 10/20/2013, 11/29/2014, 11/23/2015, 10/29/2016, 09/18/2017, 11/24/2019,  11/15/2020, 11/15/2021, 09/25/2022   PFIZER(Purple Top)SARS-COV-2 Vaccination 02/27/2020, 03/19/2020, 12/09/2020   Pfizer Covid-19 Vaccine Bivalent Booster 13yr & up 11/15/2021   Pneumococcal Polysaccharide-23 07/18/2009, 08/16/2014, 01/20/2019   Tdap 12/22/2015   Advised Shingrix vaccine.  She will consider.    Screening for cancer: Colon cancer screening: Reviewed 2020 visit  Breast cancer screening: You should perform a self breast exam monthly.   We reviewed recommendations for regular mammograms and breast cancer screening.  Cervical cancer screening: We reviewed recommendations for pap smear screening.   Skin cancer screening: Check your skin regularly for new changes, growing lesions, or other lesions of concern Come in for evaluation if you have skin lesions of concern.  Lung cancer screening: If you have a greater than 20 pack year history of tobacco use, then you may qualify for lung cancer screening with a chest CT scan.   Please call your insurance company to inquire about coverage for this test.  We currently don't have screenings for other cancers besides breast, cervical, colon, and lung cancers.  If you have a strong family history of cancer or have other cancer screening concerns, please let me know.    Bone health: Get at least 150 minutes of aerobic exercise weekly Get weight bearing exercise at least once weekly Bone density test:  A bone density test is an imaging test that uses a type of X-ray to measure the amount of calcium and other minerals in your bones. The test may be used to diagnose or screen you for a condition that causes weak or thin bones (osteoporosis), predict your risk for a broken bone (fracture), or determine how well your osteoporosis treatment is working. The bone density test is recommended for females 66and older, or females or males <<35if certain risk factors such as thyroid disease, long term use of steroids such as for asthma or  rheumatological issues, vitamin D deficiency, estrogen deficiency, family history of osteoporosis, self or family history of fragility fracture in first degree relative.  Bone density reviewed from 2021, normal.    Heart health: Get at least 150 minutes of aerobic exercise weekly Limit alcohol It is important to maintain a healthy blood pressure and healthy cholesterol numbers  Heart disease screening: Screening for heart disease includes screening for blood pressure, fasting lipids, glucose/diabetes  screening, BMI height to weight ratio, reviewed of smoking status, physical activity, and diet.    Goals include blood pressure 120/80 or less, maintaining a healthy lipid/cholesterol profile, preventing diabetes or keeping diabetes numbers under good control, not smoking or using tobacco products, exercising most days per week or at least 150 minutes per week of exercise, and eating healthy variety of fruits and vegetables, healthy oils, and avoiding unhealthy food choices like fried food, fast food, high sugar and high cholesterol foods.    Other tests may possibly include EKG test, CT coronary calcium score, echocardiogram, exercise treadmill stress test.   CT chest 2020 showed no CAD or atherosclerosis.  Discussed possibly repeating this test periodically.      Medical care options: I recommend you continue to seek care here first for routine care.  We try really hard to have available appointments Monday through Friday daytime hours for sick visits, acute visits, and physicals.  Urgent care should be used for after hours and weekends for significant issues that cannot wait till the next day.  The emergency department should be used for significant potentially life-threatening emergencies.  The emergency department is expensive, can often have long wait times for less significant concerns, so try to utilize primary care, urgent care, or telemedicine when possible to avoid unnecessary trips to  the emergency department.  Virtual visits and telemedicine have been introduced since the pandemic started in 2020, and can be convenient ways to receive medical care.  We offer virtual appointments as well to assist you in a variety of options to seek medical care.   Advanced Directives: I recommend you consider completing a [place] and Living Will.   These documents respect your wishes and help alleviate burdens on your loved ones if you were to become terminally ill or be in a position to need those documents enforced.    You can complete Advanced Directives yourself, have them notarized, then have copies made for our office, for you and for anybody you feel should have them in safe keeping.  Or, you can have an attorney prepare these documents.   If you haven't updated your Last Will and Testament in a while, it may be worthwhile having an attorney prepare these documents together and save on some costs.       Separate significant issues discussed: Obesity - not exercising.  I recommend she get disciplined with exercise.  She has a work out partner that is already encouraging her to do this.   Recommended she consider Qsymia or Zepbound, but she declines.  She is [facility] on a specific diet regimen but unfortunately is gaining weight  Vitamin D deficiency - continue supplement  Insulin resistance - counseled on diet, exercise, risk of diabetes.  She declines metformin  Hyperlipidemia - continue statin  I reviewed the comprehensive labs she had done in 10/2022.  HIV disease - on therapy, managed by infectious disease  History of bony sclerosis - reviewed 10/09/2019 visit with oncology, Dr. Irene Limbo.  Plan notes from 2020 visit as below:  1. 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 01/29/19 CT A/P revealed  Numerous sclerotic osseous lesions, the majority of which are unchanged though with mild progression of a few lesions. These remain indeterminate with both malignant (metastatic disease and lymphoma) and benign conditions (sclerosing skeletal dysplasias and sarcoidosis for example) on the differential. 2. No other evidence of malignancy or acute abnormality in the abdomen or pelvis. 3. Nonobstructing left renal calculus.  02/11/19 CT Chest revealed No lung masses or nodules.  No acute findings. 2. 15 mm left breast mass. Although this is likely a benign lesion such as a fibroadenoma, in light of the skeletal lesions, diagnostic mammography with possible left breast ultrasound is recommended. 3. Multiple sclerotic bone lesions consistent with metastatic disease. -Believe that the CT Chest could be misread, and should have noted a RIGHT breast mass, rather than left.  Labs upon initial presentation: 02/03/19 SPEP was normal without an M spike detected. 12/02/18 CBC w/diff was normal.   PLAN: -Discussed pt labwork today, 10/09/19;  all values are WNL  PENDING CMP   PENDING VITAMIN D PENDING LACTATE DEHYDROGENASE  - Discussed that whole body bone scan did not show any concerning or new.  -Discussed that Blood counts look normal. Will follow up on vitamin D levels.  -Advised to continuing taking vitamin D and to continue following up with PCP -Recommended bone density test to check for osteopetrosis  -Return to clinic as needed   FOLLOW UP: RTC with Dr Irene Limbo as needed   Margaret Porter was seen today for fasting cpe.  Diagnoses and all orders for this visit:  Routine general medical examination at a health care facility  Vitamin D deficiency  Vaccine counseling  Seasonal  allergies  S/P appendectomy  Venous insufficiency  Hepatic steatosis  Insulin resistance  Class 3 severe obesity due to excess calories with serious comorbidity and body mass index (BMI) of 45.0 to 49.9 in adult [facility])  Estrogen deficiency  High risk medication use  Human immunodeficiency virus (HIV) disease ([place])  Other hyperlipidemia  Perimenopausal   Follow-up pending labs, yearly for physical

## 2023-01-29 ENCOUNTER — Other Ambulatory Visit: Payer: Self-pay

## 2023-01-29 ENCOUNTER — Encounter (INDEPENDENT_AMBULATORY_CARE_PROVIDER_SITE_OTHER): Payer: Self-pay

## 2023-01-29 VITALS — BP 118/74 | HR 62 | Temp 97.9°F | Resp 16 | Wt 278.9 lb

## 2023-01-29 DIAGNOSIS — Z006 Encounter for examination for normal comparison and control in clinical research program: Secondary | ICD-10-CM

## 2023-01-29 NOTE — Research (Signed)
Individual seen for Week 4 of Eyewitness study, A Phase 3b, multicenter, single-arm, open-label study evaluating the efficacy, safety, and tolerability of switching to DTG/3TC single tablet regimen administered once daily from a bictegravir/emtricitabine/tenofovir alafenamide single tablet regimen in people living with HIV of atleast 55 years of age who are virologically suppressed. Procedures carried out per protocol. Good adherence to study meds. Plan to see again in 8 weeks.

## 2023-04-01 ENCOUNTER — Encounter (INDEPENDENT_AMBULATORY_CARE_PROVIDER_SITE_OTHER): Payer: Self-pay

## 2023-04-01 ENCOUNTER — Other Ambulatory Visit: Payer: Self-pay

## 2023-04-01 DIAGNOSIS — Z006 Encounter for examination for normal comparison and control in clinical research program: Secondary | ICD-10-CM

## 2023-04-01 MED ORDER — STUDY - EYEWITNESS (GSK219516) - GSK3515864 (DOLUTEGRAVIR/LAMIVUDINE) 50-300 MG TABLET (PI-VAN DAM): 1.0000 | ORAL_TABLET | Freq: Every day | ORAL | 0 refills | Status: DC

## 2023-04-01 NOTE — Research (Signed)
Individual seen for Week 12 Research Visit for Eyewitness: A Phase 3b, multicenter, single-arm, open-label study evaluating the efficacy, safety, and tolerability of switching to DTG/3TC single tablet regimen administered once daily from a bictegravir/emtricitabine/tenofovir alafenamide single tablet regimen in people living with HIV of at least 55 years of age who are virologically suppressed. Overall individual is doing well. All procedures carried out per protocol. Study medications dispensed. Plan to see participant again in 12 weeks for week 24 visit.

## 2023-04-24 ENCOUNTER — Ambulatory Visit: Payer: BC Managed Care – PPO | Admitting: Medical

## 2023-04-24 ENCOUNTER — Encounter: Payer: Self-pay | Admitting: Medical

## 2023-04-24 VITALS — BP 116/70 | HR 69 | Wt 283.0 lb

## 2023-04-24 DIAGNOSIS — R079 Chest pain, unspecified: Secondary | ICD-10-CM

## 2023-04-24 NOTE — Progress Notes (Signed)
Subjective:  Margaret Porter is a 53 y.o. female who presents for Chief Complaint  Patient presents with   Acute Visit    Right side chest pain x 2-3 days     Here for right sided chest pains 2-3 days.  Has felt this prior but more consistent in last few days.   Pain yesterday was more steady at times.  Ibuprofen seems to help.  Prior to yesterday a little more intermittent.  Worse in evening.   Eating doesn't seem to worsen the pain. Last night felt some indigestion like she needed to belch.     No specific pain after eating.  Bowel movements ok, regular, not bloated ,but has had some gas.  No fever, no body aches or chill.  Has some urinary frequency but no other urinary changes.     No SOB, no left sided chest pain, no palpations.  No new leg swelling.   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    No other c/o.  Past Medical History:  Diagnosis Date   Allergic rhinitis    Allergy    Food allergy    HIV infection 2006   Idiopathic urticaria    Joint pain    Lytic bone lesions on xray    bone lesions on hip,sternum and spine   Obesity    Pre-diabetes    pt not on meds   Rosacea    Sickle cell anemia    Sickle cell trait with mom   Swelling of both lower extremities    per pt, left side is more swollen   Vitamin D deficiency    Current Outpatient Medications on File Prior to Visit  Medication Sig Dispense Refill   diphenhydrAMINE (BENADRYL) 25 MG tablet Take 25 mg by mouth every 6 (six) hours as needed for itching or allergies. Reported on 02/29/2016     hyoscyamine (LEVSIN SL) 0.125 MG SL tablet Place 1 tablet (0.125 mg total) under the tongue 3 (three) times daily as needed. 30 tablet 0   Pitavastatin Calcium 4 MG TABS Take 1 tablet (4 mg total) by mouth daily. 30 tablet 11   Study - EYEWITNESS [id number]) - [id number] (dolutegravir/lamivudine) 50-300 mg tablet ([place])  Take 1 tablet by mouth daily. 90 tablet 0   Vitamin D, Ergocalciferol, (DRISDOL) 1.25 MG (50000 UNIT) CAPS capsule Take 1 capsule (50,000 Units total) by mouth every 7 (seven) days. 12 capsule 0   No current facility-administered medications on file prior to visit.   Past Surgical History:  Procedure Laterality Date   COLONOSCOPY  03/09/2019   diverticulosis sigmoid, int hemorrhoids, Dr. Marsa Aris   DILATATION & CURRETTAGE/HYSTEROSCOPY WITH RESECTOCOPE N/A 06/27/2015   Procedure: DILATATION & CURETTAGE/HYSTEROSCOPY WITH RESECTOCOPE;  Surgeon: Maxie Better, MD;  Location: [facility];  Service: Gynecology;  Laterality: N/A;   LAPAROSCOPIC APPENDECTOMY N/A 09/07/2017   Procedure: APPENDECTOMY LAPAROSCOPIC;  Surgeon: Berna Bue, MD;  Location: [facility];  Service: General;  Laterality: N/A;     The following portions of the patient's history were reviewed and updated as appropriate: allergies, current medications, past family history, past medical history, past social history, past surgical history and problem list.  ROS Otherwise as in subjective above    Objective: BP 116/70   Pulse 69   Wt 283 lb (128.4 kg)   SpO2 99%   BMI 48.58 kg/m  Wt Readings from Last 3 Encounters:  04/24/23 283 lb (128.4 kg)  04/01/23 285 lb 15 oz (129.7 kg)  01/29/23 278 lb 14.1 oz (126.5 kg)    General appearance: alert, no distress, well developed, well nourished Chest wall nontender, normal I:E Heart: RRR, normal S1, S2, no murmurs Lungs: CTA bilaterally, no wheezes, rhonchi, or rales Abdomen: +bs, soft, non tender, non distended, no masses, no hepatomegaly, no splenomegaly Pulses: 2+ radial pulses, 2+ pedal pulses, normal cap refill Ext: no edema Back: nontender   Assessment: Encounter Diagnosis  Name Primary?   Right-sided chest pain Yes     Plan: We discussed her symptoms.  Symptoms are atypical for cardiac issues so I doubt any cardiopulmonary cause of her current  symptoms.  She does not have really any gastric symptoms or urinary symptoms either.  Ibuprofen does help which further suggest more of a musculoskeletal chest wall pain.  Advise she use ibuprofen or Aleve for the next few days as he has seen some benefit with NSAID.  I gave her some samples of Aleve today.  Also advise she take some Pepcid samples I gave her today for the next 4 days and avoid acidic or spicy foods in case there is some indigestion at play as well  Advised if any worsening, new symptoms, severe symptoms or different symptoms over the next 2 days to call back in case her symptoms better clues over the next few days.  No real indication for further testing with labs or imaging today.  She understands and agrees with plan  She has been HIV study.  She had some body scans and recent few months that were normal  Kylen was seen today for acute visit.  Diagnoses and all orders for this visit:  Right-sided chest pain    Follow up: prn

## 2023-05-10 ENCOUNTER — Encounter (INDEPENDENT_AMBULATORY_CARE_PROVIDER_SITE_OTHER): Payer: Self-pay

## 2023-05-10 ENCOUNTER — Other Ambulatory Visit: Payer: Self-pay

## 2023-05-10 DIAGNOSIS — Z006 Encounter for examination for normal comparison and control in clinical research program: Secondary | ICD-10-CM

## 2023-05-10 NOTE — Research (Signed)
Individual seen for .Week 528 for A5321,the [facility] Study evaluating the decay of HIV-1 reservoirs in subjects on long term antiretroviral therapy. For patient care concerns please call [phone]. Overall individual is doing well. All procedures carried out per protocol. Plan to see participant again in November 2024

## 2023-05-11 LAB — CREATININE, SERUM: Creat: 0.95 mg/dL (ref 0.50–1.03)

## 2023-05-11 LAB — HEPATITIS C ANTIBODY: Hepatitis C Ab: NONREACTIVE

## 2023-06-11 ENCOUNTER — Encounter (INDEPENDENT_AMBULATORY_CARE_PROVIDER_SITE_OTHER): Payer: Self-pay | Admitting: Family Medicine

## 2023-06-11 ENCOUNTER — Ambulatory Visit (INDEPENDENT_AMBULATORY_CARE_PROVIDER_SITE_OTHER): Payer: BC Managed Care – PPO | Admitting: Family Medicine

## 2023-06-11 VITALS — BP 127/82 | HR 87 | Temp 98.9°F | Ht 63.0 in | Wt 277.0 lb

## 2023-06-11 DIAGNOSIS — E559 Vitamin D deficiency, unspecified: Secondary | ICD-10-CM | POA: Diagnosis not present

## 2023-06-11 DIAGNOSIS — Z6841 Body Mass Index (BMI) 40.0 and over, adult: Secondary | ICD-10-CM | POA: Diagnosis not present

## 2023-06-11 DIAGNOSIS — E88819 Insulin resistance, unspecified: Secondary | ICD-10-CM | POA: Diagnosis not present

## 2023-06-11 MED ORDER — VITAMIN D (ERGOCALCIFEROL) 1.25 MG (50000 UNIT) PO CAPS: 50000.0000 [IU] | ORAL_CAPSULE | ORAL | 0 refills | Status: DC

## 2023-06-11 NOTE — Progress Notes (Signed)
Margaret Porter, D.O.  ABFM, ABOM Specializing in Clinical Bariatric Medicine  Office located at: [address] [place], [place]  [address]     Assessment and Plan:   Medications Discontinued During This Encounter  Medication Reason   Vitamin D, Ergocalciferol, (DRISDOL) 1.25 MG (50000 UNIT) CAPS capsule Reorder     Meds ordered this encounter  Medications   Vitamin D, Ergocalciferol, (DRISDOL) 1.25 MG (50000 UNIT) CAPS capsule    Sig: Take 1 capsule (50,000 Units total) by mouth every 7 (seven) days.    Dispense:  4 capsule    Refill:  0    Come in fasting for labs and repeat IC  Insulin resistance Assessment: Condition is diet/exercise controlled and not optimized. Patient is not taking any medicines for this condition.  Lab Results  Component Value Date   HGBA1C 5.5 11/07/2022   HGBA1C 5.2 01/18/2022   HGBA1C 5.2 07/06/2021   INSULIN 29.2 (H) 11/07/2022   INSULIN 30.5 (H) 01/18/2022   INSULIN 44.2 (H) 07/06/2021    Plan: Continue to decrease simple carbs/ sugars; increase fiber and proteins -> follow her meal plan. Monia will continue to work on weight loss, exercise, via their meal plan we devised to help decrease the risk of progressing to diabetes. We will recheck A1c and fasting insulin level in approximately 3 months from last check, or as deemed appropriate.     Vitamin D deficiency Assessment: Condition is not at goal. Patient endorses that she has not been taking her Ergocalciferol 50K IU weekly regularly. Denies any adverse effects.  Lab Results  Component Value Date   VD25OH 47.2 11/07/2022   VD25OH 61.6 01/18/2022   VD25OH 49.7 07/06/2021   Plan: Continue with supplement. Will reorder this today. Weight loss will likely improve availability of vitamin D, thus encouraged Margaret Porter to continue with meal plan and their weight loss efforts to further improve this condition.  Thus, we will need to monitor levels regularly (every 3-4 mo on average)  to keep levels within normal limits and prevent over supplementation.   TREATMENT PLAN FOR OBESITY: Class 3 severe obesity due to excess calories with serious comorbidity and body mass index (BMI) of 45.0 to 49.9 in adult [name]) Assessment: Margaret Porter is here to discuss her progress with her obesity treatment plan along with follow-up of her obesity related diagnoses. See Medical Weight Management Flowsheet for complete bioelectrical impedance results.  Condition is not optimized. Biometric data collected today, was reviewed with patient.   Since last office visit on 12/04/22 patient's  Muscle mass has decreased by 4.6 lb. Fat mass has decreased by 0.8 lb. Total body water has decreased by 2.4 lb.  Counseling done on how various foods will affect these numbers and how to maximize success  Total lbs lost to date: + 9  Total weight loss percentage to date: + 3.25   Plan: Restart the Category 2 meal plan with 6 ounces of lean protein at lunch and 8 ounces at dinner.   Behavioral Intervention Additional resources provided today: Protein Equivalence Handout, Category 2 meal plan, Grocery List  Evidence-based interventions for health behavior change were utilized today including the discussion of self monitoring techniques, problem-solving barriers and SMART goal setting techniques.   Regarding patient's less desirable eating habits and patterns, we employed the technique of small changes.  Pt will specifically work on: following her prescribed meal plan to the best of her ability for next visit.    Recommended Physical  Activity Goals  Margaret Porter has been advised to slowly work up to 150 minutes of moderate intensity aerobic activity a week and strengthening exercises 2-3 times per week for cardiovascular health, weight loss maintenance and preservation of muscle mass.   She has agreed to Continue current level of physical activity    FOLLOW UP: Return in about 29 days (around  07/10/2023). She was informed of the importance of frequent follow up visits to maximize her success with intensive lifestyle modifications for her multiple health conditions.   Subjective:   Chief complaint: Obesity Margaret Porter is here to discuss her progress with her obesity treatment plan. She is on the the Category 2 Plan and states she is following her eating plan approximately 0% of the time. She states she is walking 20 minutes 5 days per week.  Interval History:  Margaret Porter is here for a follow up office visit. Patient's last visit with a provider was on 12/04/2022 with Dawn.  Since last office visit:   - Her last IC was done on 10/2018 and has not been repeated since then, however she weighed 262 then and now she is at 277- hence, it is likely it hasn't changed much  - She has not been following the Category 2 meal plan and has been drinking caloric beverages.   - Margaret Porter expresses interest in getting back on track with her meal plan.   Review of Systems:  Pertinent positives were addressed with patient today.  Weight Summary and Biometrics   No data recorded Weight Gained Since Last Visit: 3    Vitals Temp: 98.9 F (37.2 C) BP: 127/82 Pulse Rate: 87 SpO2: 97 %   Anthropometric Measurements Height: 5\' 3"  (1.6 m) Weight: 277 lb (125.6 kg) BMI (Calculated): 49.08 Weight at Last Visit: 274lb Weight Gained Since Last Visit: 3 Total Weight Loss (lbs): 0 lb (0 kg)   Body Composition  Body Fat %: 52.7 % Fat Mass (lbs): 146.2 lbs Muscle Mass (lbs): 124.6 lbs Total Body Water (lbs): 93.4 lbs Visceral Fat Rating : 19   Other Clinical Data Today's Visit #: 69   Objective:   PHYSICAL EXAM: Blood pressure 127/82, pulse 87, temperature 98.9 F (37.2 C), height 5\' 3"  (1.6 m), weight 277 lb (125.6 kg), SpO2 97 %. Body mass index is 49.07 kg/m.  General: Well Developed, well nourished, and in no acute distress.  HEENT: Normocephalic, atraumatic Skin:  Warm and dry, cap RF less 2 sec, good turgor Chest:  Normal excursion, shape, no gross abn Respiratory: speaking in full sentences, no conversational dyspnea NeuroM-Sk: Ambulates w/o assistance, moves * 4 Psych: A and O *3, insight good, mood-full  DIAGNOSTIC DATA REVIEWED:  BMET    Component Value Date/Time   NA 141 10/31/2022 0819   NA 143 01/18/2022 0832   K 4.4 10/31/2022 0819   CL 106 10/31/2022 0819   CO2 28 10/31/2022 0819   GLUCOSE 96 10/31/2022 0819   BUN 12 10/31/2022 0819   BUN 15 01/18/2022 0832   CREATININE 0.95 05/10/2023 0850   CALCIUM 9.1 10/31/2022 0819   CALCIUM 9.6 04/09/2019 1534   GFRNONAA 65 06/06/2020 1240   GFRNONAA >60 10/09/2019 1122   GFRNONAA 71 04/03/2019 0913   GFRAA 75 06/06/2020 1240   GFRAA >60 10/09/2019 1122   GFRAA 82 04/03/2019 0913   Lab Results  Component Value Date   HGBA1C 5.5 11/07/2022   HGBA1C 5.5 05/22/2013   Lab Results  Component Value Date   INSULIN 29.2 (  H) 11/07/2022   INSULIN 21.1 11/12/2018   Lab Results  Component Value Date   TSH 2.760 02/16/2020   CBC    Component Value Date/Time   WBC 4.8 10/31/2022 0819   RBC 4.53 10/31/2022 0819   HGB 14.5 10/31/2022 0819   HGB 12.8 01/13/2021 1016   HCT 42.8 10/31/2022 0819   HCT 38.9 01/13/2021 1016   PLT 226 10/31/2022 0819   PLT 267 01/13/2021 1016   MCV 94.5 10/31/2022 0819   MCV 95 01/13/2021 1016   MCH 32.0 10/31/2022 0819   MCHC 33.9 10/31/2022 0819   RDW 12.3 10/31/2022 0819   RDW 13.5 01/13/2021 1016   Iron Studies    Component Value Date/Time   IRON 60 01/13/2021 1016   Lipid Panel     Component Value Date/Time   CHOL 133 10/31/2022 0819   CHOL 163 07/06/2021 1000   TRIG 71 10/31/2022 0819   HDL 39 (L) 10/31/2022 0819   HDL 42 07/06/2021 1000   CHOLHDL 3.4 10/31/2022 0819   VLDL 24 04/04/2017 0935   LDLCALC 79 10/31/2022 0819   Hepatic Function Panel     Component Value Date/Time   PROT 6.1 10/31/2022 0819   PROT 6.0 01/18/2022 0832    ALBUMIN 4.3 01/18/2022 0832   AST 18 10/31/2022 0819   AST 18 10/09/2019 1122   ALT 20 10/31/2022 0819   ALT 20 10/09/2019 1122   ALKPHOS 120 01/22/2022 0936   BILITOT 0.5 10/31/2022 0819   BILITOT 0.3 01/18/2022 0832   BILITOT 0.4 10/09/2019 1122   BILIDIR 0.1 03/08/2021 0907   IBILI 0.3 12/22/2015 0001      Component Value Date/Time   TSH 2.760 02/16/2020 0831   Nutritional Lab Results  Component Value Date   VD25OH 47.2 11/07/2022   VD25OH 61.6 01/18/2022   VD25OH 49.7 07/06/2021    Attestations:   Reviewed by clinician on day of visit: allergies, medications, problem list, medical history, surgical history, family history, social history, and previous encounter notes.    I,Special Puri,acting as a Neurosurgeon for [employer], DO.,have documented all relevant documentation on the behalf of Margaret Lot, DO,as directed by  Margaret Lot, DO while in the presence of Margaret Lot, DO.   I, Margaret Lot, DO, have reviewed all documentation for this visit. The documentation on 06/11/23 for the exam, diagnosis, procedures, and orders are all accurate and complete.

## 2023-06-17 ENCOUNTER — Other Ambulatory Visit: Payer: Self-pay

## 2023-06-17 ENCOUNTER — Encounter (INDEPENDENT_AMBULATORY_CARE_PROVIDER_SITE_OTHER): Payer: Self-pay

## 2023-06-17 DIAGNOSIS — Z006 Encounter for examination for normal comparison and control in clinical research program: Secondary | ICD-10-CM

## 2023-06-17 MED ORDER — STUDY - EYEWITNESS (GSK219516) - GSK3515864 (DOLUTEGRAVIR/LAMIVUDINE) 50-300 MG TABLET (PI-VAN DAM): 1.0000 | ORAL_TABLET | Freq: Every day | ORAL | 0 refills | Status: DC

## 2023-06-17 NOTE — Research (Signed)
Individual seen for Week 24 for Eyewitness, a Phase 3b, multicenter, single-arm, open-label study evaluating the efficacy, safety, and tolerability of switching to DTG/3TC single tablet regimen administered once daily from a bictegravir/emtricitabine/tenofovir alafenamide single tablet regimen in people living with HIV of atleast 55 years of age who are virologically suppressed. Overall individual is doing well. All procedures carried out per protocol. Study medications dispensed. Plan to see participant again in 24 weeks.

## 2023-06-17 NOTE — Addendum Note (Signed)
Addended by: Arvilla Meres R on: 06/17/2023 02:10 PM   Modules accepted: Orders

## 2023-07-01 ENCOUNTER — Other Ambulatory Visit (INDEPENDENT_AMBULATORY_CARE_PROVIDER_SITE_OTHER): Payer: Self-pay | Admitting: Family Medicine

## 2023-07-01 DIAGNOSIS — E559 Vitamin D deficiency, unspecified: Secondary | ICD-10-CM

## 2023-07-10 ENCOUNTER — Encounter (INDEPENDENT_AMBULATORY_CARE_PROVIDER_SITE_OTHER): Payer: Self-pay | Admitting: Physician Assistant

## 2023-07-10 ENCOUNTER — Ambulatory Visit (INDEPENDENT_AMBULATORY_CARE_PROVIDER_SITE_OTHER): Payer: BC Managed Care – PPO | Admitting: Physician Assistant

## 2023-07-10 VITALS — BP 116/79 | HR 73 | Temp 98.2°F | Ht 63.0 in | Wt 274.0 lb

## 2023-07-10 DIAGNOSIS — E88819 Insulin resistance, unspecified: Secondary | ICD-10-CM | POA: Diagnosis not present

## 2023-07-10 DIAGNOSIS — E7849 Other hyperlipidemia: Secondary | ICD-10-CM | POA: Diagnosis not present

## 2023-07-10 DIAGNOSIS — Z6841 Body Mass Index (BMI) 40.0 and over, adult: Secondary | ICD-10-CM | POA: Insufficient documentation

## 2023-07-10 DIAGNOSIS — R0602 Shortness of breath: Secondary | ICD-10-CM | POA: Diagnosis not present

## 2023-07-10 DIAGNOSIS — E559 Vitamin D deficiency, unspecified: Secondary | ICD-10-CM | POA: Diagnosis not present

## 2023-07-10 MED ORDER — VITAMIN D (ERGOCALCIFEROL) 1.25 MG (50000 UNIT) PO CAPS
50000.0000 [IU] | ORAL_CAPSULE | ORAL | 0 refills | Status: DC
Start: 2023-07-10 — End: 2023-08-07

## 2023-07-10 NOTE — Progress Notes (Signed)
.smr  Office: [phone]  /  Fax: [phone]  WEIGHT SUMMARY AND BIOMETRICS  Vitals Temp: 98.2 F (36.8 C) BP: 116/79 Pulse Rate: 73 SpO2: 94 %   Anthropometric Measurements Height: 5\' 3"  (1.6 m) Weight: 274 lb (124.3 kg) BMI (Calculated): 48.55 Weight at Last Visit: 277 lb Weight Lost Since Last Visit: 3 lb Weight Gained Since Last Visit: 0 Starting Weight: 262 lb Total Weight Loss (lbs): 0 lb (0 kg) Peak Weight: 278 lb   Body Composition  Body Fat %: 50.5 % Fat Mass (lbs): 138.4 lbs Muscle Mass (lbs): 128.8 lbs Total Body Water (lbs): 93.6 lbs Visceral Fat Rating : 18   Other Clinical Data RMR: 1814 Fasting: yes Labs: yes Today's Visit #: 70 Starting Date: 11/12/18     HPI  Chief Complaint: OBESITY  Leonela is here to discuss her progress with her obesity treatment plan. She is on the the Category 2 Plan and states she is following her eating plan approximately 75 % of the time. She states she is exercising 0 minutes 0 times per week.   Interval History:  Working to get back on track with nutrition plan after ~ 6 months hiatus  Since last office visit she is down 3 lbs.  Skipping meals and then struggling with cravings.  We discussed the importance of regular meals and adequate protein intake for weight loss and overall health. 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 Does better when has schedule Sleep- Not always restorative and we discussed trying to keep regular bedtime/wake schedule Exercise-[facility]inking about [facility] [employer]. Has not been able to get into regular walking for exercise or other activity  Repeated Indirect calorimetry today showed REE of 1814- which is lower than previous REE of 2080 in 2019. Her calculated basal metabolic rate is 2956 and thus her metabolic rate has worsened and is lower than  predicted.   Pharmacotherapy: None for weight loss On metformin in the past. - Stopped in 2020 due to skipping meals.   TREATMENT PLAN FOR OBESITY:  Recommended Dietary Goals  Dinesha is currently in the action stage of change. As such, her goal is to continue weight management plan. She has agreed to the Category 2 Plan.  Behavioral Intervention  We discussed the following Behavioral Modification Strategies today: increasing lean protein intake, decreasing simple carbohydrates , increasing vegetables, increasing lower glycemic fruits, avoiding skipping meals, increasing water intake, work on meal planning and preparation, identifying sources and decreasing liquid calories, emotional eating strategies and understanding the difference between hunger signals and cravings, continue to practice mindfulness when eating, and planning for success.  Additional resources provided today: NA  Recommended Physical Activity Goals  Jasmane has been advised to work up to 150 minutes of moderate intensity aerobic activity a week and strengthening exercises 2-3 times per week for cardiovascular health, weight loss maintenance and preservation of muscle mass.   She has agreed to Think about ways to increase daily physical activity and overcoming barriers to exercise and Increase physical activity in their day and reduce sedentary time (increase NEAT).   Pharmacotherapy We discussed various medication options to help Wylma with her weight loss efforts and we both agreed to continue to work on nutritional and behavioral strategies to promote weight loss.     Return in about 3 weeks (around 07/31/2023).[name] Kitchen She was informed of the importance of frequent follow up visits to maximize  her success with intensive lifestyle modifications for her multiple health conditions.  PHYSICAL EXAM:  Blood pressure 116/79, pulse 73, temperature 98.2 F (36.8 C), height 5\' 3"  (1.6 m), weight 274 lb (124.3 kg), SpO2 94  %. Body mass index is 48.54 kg/m.  General: She is overweight, cooperative, alert, well developed, and in no acute distress. PSYCH: Has normal mood, affect and thought process.   Cardiovascular: HR 70's BP 116/79 Lungs: Normal breathing effort, no conversational dyspnea. Neuro: no focal deficits  DIAGNOSTIC DATA REVIEWED:  BMET    Component Value Date/Time   NA 141 10/31/2022 0819   NA 143 01/18/2022 0832   K 4.4 10/31/2022 0819   CL 106 10/31/2022 0819   CO2 28 10/31/2022 0819   GLUCOSE 96 10/31/2022 0819   BUN 12 10/31/2022 0819   BUN 15 01/18/2022 0832   CREATININE 0.95 05/10/2023 0850   CALCIUM 9.1 10/31/2022 0819   CALCIUM 9.6 04/09/2019 1534   GFRNONAA 65 06/06/2020 1240   GFRNONAA >60 10/09/2019 1122   GFRNONAA 71 04/03/2019 0913   GFRAA 75 06/06/2020 1240   GFRAA >60 10/09/2019 1122   GFRAA 82 04/03/2019 0913   Lab Results  Component Value Date   HGBA1C 5.5 11/07/2022   HGBA1C 5.5 05/22/2013   Lab Results  Component Value Date   INSULIN 29.2 (H) 11/07/2022   INSULIN 21.1 11/12/2018   Lab Results  Component Value Date   TSH 2.760 02/16/2020   CBC    Component Value Date/Time   WBC 4.8 10/31/2022 0819   RBC 4.53 10/31/2022 0819   HGB 14.5 10/31/2022 0819   HGB 12.8 01/13/2021 1016   HCT 42.8 10/31/2022 0819   HCT 38.9 01/13/2021 1016   PLT 226 10/31/2022 0819   PLT 267 01/13/2021 1016   MCV 94.5 10/31/2022 0819   MCV 95 01/13/2021 1016   MCH 32.0 10/31/2022 0819   MCHC 33.9 10/31/2022 0819   RDW 12.3 10/31/2022 0819   RDW 13.5 01/13/2021 1016   Iron Studies    Component Value Date/Time   IRON 60 01/13/2021 1016   Lipid Panel     Component Value Date/Time   CHOL 133 10/31/2022 0819   CHOL 163 07/06/2021 1000   TRIG 71 10/31/2022 0819   HDL 39 (L) 10/31/2022 0819   HDL 42 07/06/2021 1000   CHOLHDL 3.4 10/31/2022 0819   VLDL 24 04/04/2017 0935   LDLCALC 79 10/31/2022 0819   Hepatic Function Panel     Component Value Date/Time    PROT 6.1 10/31/2022 0819   PROT 6.0 01/18/2022 0832   ALBUMIN 4.3 01/18/2022 0832   AST 18 10/31/2022 0819   AST 18 10/09/2019 1122   ALT 20 10/31/2022 0819   ALT 20 10/09/2019 1122   ALKPHOS 120 01/22/2022 0936   BILITOT 0.5 10/31/2022 0819   BILITOT 0.3 01/18/2022 0832   BILITOT 0.4 10/09/2019 1122   BILIDIR 0.1 03/08/2021 0907   IBILI 0.3 12/22/2015 0001      Component Value Date/Time   TSH 2.760 02/16/2020 0831   Nutritional Lab Results  Component Value Date   VD25OH 47.2 11/07/2022   VD25OH 61.6 01/18/2022   VD25OH 49.7 07/06/2021    ASSOCIATED CONDITIONS ADDRESSED TODAY  ASSESSMENT AND PLAN  Problem List Items Addressed This Visit     Other hyperlipidemia   Relevant Orders   Lipid Panel With LDL/HDL Ratio   Morbid obesity (HCC)   Vitamin D deficiency   Relevant Medications   Vitamin D,  Ergocalciferol, (DRISDOL) 1.25 MG (50000 UNIT) CAPS capsule   Other Relevant Orders   VITAMIN D 25 Hydroxy (Vit-D Deficiency, Fractures)   Insulin resistance - Primary   Relevant Orders   CMP14+EGFR   Hemoglobin A1c   Insulin, random   SOBOE (shortness of breath on exertion)   Relevant Orders   Vitamin B12   CBC with Differential/Platelet   TSH   BMI 45.0-49.9, adult (HCC) Current BMI 48.5  SOB on exertion: Erisa notes increasing shortness of breath with exercising and seems to be worsening over time with weight gain. She notes getting out of breath sooner with activity than she used to. This has not gotten worse recently. Retaj denies shortness of breath at rest or orthopnea. Perel does feel that she gets out of breath more easily that she used to when she exercises. Glenola's shortness of breath appears to be obesity related and exercise induced. She has agreed to work on weight loss and gradually increase exercise to treat her exercise induced shortness of breath. Will continue to monitor closely. She endorses fatigue and we will recheck labs today- CBC, B12,  Vitamin D and TSH.   Insulin Resistance Last fasting insulin was 29.2- not at goal. A1c was 5.5- at goal. Last labs were 11/07/22. Polyphagia:Yes Medication(s): None Lab Results  Component Value Date   HGBA1C 5.5 11/07/2022   HGBA1C 5.2 01/18/2022   HGBA1C 5.2 07/06/2021   HGBA1C 5.3 01/26/2021   HGBA1C 5.3 01/13/2021   Lab Results  Component Value Date   INSULIN 29.2 (H) 11/07/2022   INSULIN 30.5 (H) 01/18/2022   INSULIN 44.2 (H) 07/06/2021   INSULIN 43.5 (H) 01/26/2021   INSULIN 35.7 (H) 06/06/2020    Plan:  Continue working on nutrition plan to decrease simple carbohydrates, increase lean proteins and exercise to promote weight loss, improve glycemic control and prevent progression to Type 2 diabetes.  Recheck fasting labs today.  Consider incretin therapy again for insulin resistance.   Vitamin D Deficiency Vitamin D is not at goal of 50.  Most recent vitamin D level was 47.2. She is on  prescription ergocalciferol 50,000 IU weekly. No N/V or muscle weakness or other side effects with Ergocalciferol.  Lab Results  Component Value Date   VD25OH 47.2 11/07/2022   VD25OH 61.6 01/18/2022   VD25OH 49.7 07/06/2021    Plan: Continue and refill  prescription ergocalciferol 50,000 IU weekly Recheck vitamin D level today to optimize supplementation.  Low vitamin D levels can be associated with adiposity and may result in leptin resistance and weight gain. Also associated with fatigue. Currently on vitamin D supplementation without any adverse effects.    Hyperlipidemia LDL is at goal. Medication(s): pitavastatin 4mg  daily. HDL low at 39. Trig 79- at goal.  Cardiovascular risk factors: dyslipidemia, obesity (BMI >= 30 kg/m2), and sedentary lifestyle  Lab Results  Component Value Date   CHOL 133 10/31/2022   HDL 39 (L) 10/31/2022   LDLCALC 79 10/31/2022   TRIG 71 10/31/2022   CHOLHDL 3.4 10/31/2022   CHOLHDL 3.3 10/30/2021   CHOLHDL 3.7 10/27/2020   Lab Results   Component Value Date   ALT 20 10/31/2022   AST 18 10/31/2022   ALKPHOS 120 01/22/2022   BILITOT 0.5 10/31/2022   The 10-year ASCVD risk score (Arnett DK, et al., 2019) is: 2%   Values used to calculate the score:     Age: 30 years     Sex: Female     Is Non-Hispanic African American:  Yes     Diabetic: No     Tobacco smoker: No     Systolic Blood Pressure: 116 mmHg     Is BP treated: No     HDL Cholesterol: 39 mg/dL     Total Cholesterol: 133 mg/dL  Plan: Continue pitavastatin.  Continue to work on nutrition plan -decreasing simple carbohydrates, increasing lean proteins, decreasing saturated fats and cholesterol , avoiding trans fats and exercise as able to promote weight loss, improve lipids and decrease cardiovascular risks. Recheck fasting lipid panel today.       ATTESTASTION STATEMENTS:  Reviewed by clinician on day of visit: allergies, medications, problem list, medical history, surgical history, family history, social history, and previous encounter notes.   I have personally spent 47 minutes total time today in preparation, patient care, nutritional counseling and documentation for this visit, including the following: review of clinical lab tests; review of medical tests/procedures/services.      Mehr Depaoli, PA-C

## 2023-07-11 LAB — CMP14+EGFR
ALT: 18 IU/L (ref 0–32)
AST: 19 IU/L (ref 0–40)
Albumin: 4.3 g/dL (ref 3.8–4.9)
Alkaline Phosphatase: 148 IU/L — ABNORMAL HIGH (ref 44–121)
BUN/Creatinine Ratio: 13 (ref 9–23)
BUN: 14 mg/dL (ref 6–24)
Bilirubin Total: 0.4 mg/dL (ref 0.0–1.2)
CO2: 23 mmol/L (ref 20–29)
Calcium: 9.6 mg/dL (ref 8.7–10.2)
Chloride: 103 mmol/L (ref 96–106)
Creatinine, Ser: 1.12 mg/dL — ABNORMAL HIGH (ref 0.57–1.00)
Globulin, Total: 2.1 g/dL (ref 1.5–4.5)
Glucose: 97 mg/dL (ref 70–99)
Potassium: 4.1 mmol/L (ref 3.5–5.2)
Sodium: 143 mmol/L (ref 134–144)
Total Protein: 6.4 g/dL (ref 6.0–8.5)
eGFR: 58 mL/min/{1.73_m2} — ABNORMAL LOW (ref >59)

## 2023-07-11 LAB — HEMOGLOBIN A1C
Est. average glucose Bld gHb Est-mCnc: 114 mg/dL
Hgb A1c MFr Bld: 5.6 % (ref 4.8–5.6)

## 2023-07-11 LAB — VITAMIN B12: Vitamin B-12: 317 pg/mL (ref 232–1245)

## 2023-07-11 LAB — CBC WITH DIFFERENTIAL/PLATELET
Basophils Absolute: 0 10*3/uL (ref 0.0–0.2)
Basos: 0 %
EOS (ABSOLUTE): 0 10*3/uL (ref 0.0–0.4)
Eos: 1 %
Hematocrit: 43.5 % (ref 34.0–46.6)
Hemoglobin: 14.5 g/dL (ref 11.1–15.9)
Immature Grans (Abs): 0 10*3/uL (ref 0.0–0.1)
Immature Granulocytes: 0 %
Lymphocytes Absolute: 1.8 10*3/uL (ref 0.7–3.1)
Lymphs: 38 %
MCH: 32.4 pg (ref 26.6–33.0)
MCHC: 33.3 g/dL (ref 31.5–35.7)
MCV: 97 fL (ref 79–97)
Monocytes Absolute: 0.4 10*3/uL (ref 0.1–0.9)
Monocytes: 8 %
Neutrophils Absolute: 2.5 10*3/uL (ref 1.4–7.0)
Neutrophils: 53 %
Platelets: 194 10*3/uL (ref 150–450)
RBC: 4.47 x10E6/uL (ref 3.77–5.28)
RDW: 12.9 % (ref 11.7–15.4)
WBC: 4.7 10*3/uL (ref 3.4–10.8)

## 2023-07-11 LAB — LIPID PANEL WITH LDL/HDL RATIO
Cholesterol, Total: 111 mg/dL (ref 100–199)
HDL: 38 mg/dL — ABNORMAL LOW (ref >39)
LDL Chol Calc (NIH): 55 mg/dL (ref 0–99)
LDL/HDL Ratio: 1.4 ratio (ref 0.0–3.2)
Triglycerides: 96 mg/dL (ref 0–149)
VLDL Cholesterol Cal: 18 mg/dL (ref 5–40)

## 2023-07-11 LAB — TSH: TSH: 1.9 u[IU]/mL (ref 0.450–4.500)

## 2023-07-11 LAB — VITAMIN D 25 HYDROXY (VIT D DEFICIENCY, FRACTURES): Vit D, 25-Hydroxy: 56.8 ng/mL (ref 30.0–100.0)

## 2023-07-11 LAB — INSULIN, RANDOM: INSULIN: 27.9 u[IU]/mL — ABNORMAL HIGH (ref 2.6–24.9)

## 2023-08-07 ENCOUNTER — Ambulatory Visit (INDEPENDENT_AMBULATORY_CARE_PROVIDER_SITE_OTHER): Payer: BC Managed Care – PPO | Admitting: Physician Assistant

## 2023-08-07 ENCOUNTER — Encounter (INDEPENDENT_AMBULATORY_CARE_PROVIDER_SITE_OTHER): Payer: Self-pay | Admitting: Physician Assistant

## 2023-08-07 VITALS — BP 119/82 | HR 77 | Temp 98.1°F | Ht 63.0 in | Wt 276.0 lb

## 2023-08-07 DIAGNOSIS — G479 Sleep disorder, unspecified: Secondary | ICD-10-CM | POA: Diagnosis not present

## 2023-08-07 DIAGNOSIS — E559 Vitamin D deficiency, unspecified: Secondary | ICD-10-CM

## 2023-08-07 DIAGNOSIS — E88819 Insulin resistance, unspecified: Secondary | ICD-10-CM

## 2023-08-07 DIAGNOSIS — Z6841 Body Mass Index (BMI) 40.0 and over, adult: Secondary | ICD-10-CM

## 2023-08-07 MED ORDER — VITAMIN D (ERGOCALCIFEROL) 1.25 MG (50000 UNIT) PO CAPS: 50000.0000 [IU] | ORAL_CAPSULE | ORAL | 0 refills | Status: DC

## 2023-08-07 MED ORDER — TOPIRAMATE 25 MG PO TABS
25.0000 mg | ORAL_TABLET | Freq: Every day | ORAL | 0 refills | Status: DC
Start: 2023-08-07 — End: 2024-01-16

## 2023-08-07 NOTE — Progress Notes (Signed)
.smr  Office: [phone]  /  Fax: [phone]  WEIGHT SUMMARY AND BIOMETRICS  Vitals Temp: 98.1 F (36.7 C) BP: 119/82 Pulse Rate: 77 SpO2: 95 %   Anthropometric Measurements Height: 5\' 3"  (1.6 m) Weight: 276 lb (125.2 kg) BMI (Calculated): 48.9 Weight at Last Visit: 274 lb Weight Lost Since Last Visit: 0 Weight Gained Since Last Visit: 2 lb Starting Weight: 262 lb Total Weight Loss (lbs): 0 lb (0 kg) Peak Weight: 278 lb   Body Composition  Body Fat %: 53.2 % Fat Mass (lbs): 147 lbs Muscle Mass (lbs): 122.8 lbs Total Body Water (lbs): 91.6 lbs Visceral Fat Rating : 19   Other Clinical Data Fasting: yes Labs: no Today's Visit #: 71 Starting Date: 11/12/18  Protein Shakes  What is a protein shake? Protein shakes are usually made with a protein source (protein powder, yogurt, etc.) frozen fruit, milk, water and/or ice.  A protein shake can be a healthy way to get your protein if you miss a meal.    When to drink a protein shake? Protein shakes can be beneficial if you decide to not eat a meal or are unable to eat for whatever reason.  What are the benefits of a protein shake? Protein shakes are an excellent way to get an adequate protein for anyone but especially for persons who are vegetarians or vegan.  Additionally, protein shakes are great to drink after a workout for muscle recovery.  What are the best protein powders?         Whey protein Grow Whey Protein Isolate [employer] Whey Protein X Werks Grow Whey Protein Isolate  Plant Based [employer] Labs Rice and Pea Powder  However just basic protein powder, either whey or plant based, is fine and it's usually less expensive.  Many protein powders can be bought locally at [place] or another grocery store.   Prepared Protein Shakes Atkins Milk Chocolate Delight  Cal 160  Total fat 9gm  Total carb 7gm  Protein 15gm Ensure Original  Vanilla  Cal 220  Total fat 6gm  Total carb 32gm  Protein 9gm [employer] Signature Complete Nutrition Shake Cal 200 Total fat 6gm Total carb 22gm Protein 15g Orgain Sweet Vanilla Bean Cal 250  Total fat 7gm  Total carb 32gm  Protein 16gm Premier Protein Chocolate  Cal 160  Total fat 3gm  Total carb 5gm  Protein 30gm Premier Protein Vanilla  Cal 160  Total fat 3gm  Total carb 5gm  Protein 30gm Quest Protein Shake Chocolate  Cal 160  Total fat 3.5gm  Total carb 4gm  Protein 30gm  Quest Protein Shake Vanilla  Cal 160  Total fat 3gm  Total carb 3gm  Protein 30gm  Lactose free options Muscle Milk Genuine Chocolate  Cal 160  Total fat 5gm  Total carb 9gm  Protein 25gm Muscle Milk Genuine Vanilla  Cal 160  Total fat 5gm  Total carb 9gm  Protein 25gm Fairlife Chocolate  Cal 150-250  Total fat 2.5-8gm  Total carb 4-22gm  Protein 23-30gm    Fairlife Vanilla  Cal 150  Total fat 2.5gm  Total carb 3gm  Protein 30gm    Core Power  Chocolate or Vanilla   Cal 170  Total fat 4.5gm  Total carb 8gm  Protein 26/41gm   Ideas for Protein Shakes   High protein vegetables:  Spinach or Kale- 1/4-1/2 cup Fruit:  Fresh or frozen fruit- 1/2 cup  Protein Sources:  [place] yogurt Cottage cheese Fairlife milk Kefir  Nuts: Almonds- 2 Tbsp Peanuts- 2 Tbsp Cashews- 2 Tbsp Nut butter- 1 Tbsp  Seeds: Chia seeds- 1 Tbsp Flax seeds- 1 Tbsp Hemp seeds- 1 Tbsp   Basic Protein Shake Recipe  1 cup of frozen fruit 1/2 - 1 cup (Fairlife milk, yogurt or unsweetened almond milk) 1 scoop of protein powder (20-30 grams of protein per scoop) Optional:  artificial sweeteners (to taste), ice, water, and other ingredients listed on this sheet.  Breakfast Protein Shake  1 and 1/2 cups Fairlife Skim Milk 1 and 1/2 cups Ice 1 Greek yogurt cup 1/2 cup frozen berries 4-5 packets Splenda (to your taste)  This shake will fit in a 40 oz cup and is around 300 calories with 32 grams of protein.    Enjoy!     HPI  Chief  Complaint: OBESITY  Margaret Porter is here to discuss her progress with her obesity treatment plan. She is on the the Category 2 Plan and states she is following her eating plan approximately 50 % of the time. She states she is exercising 0 minutes 0 times per week.   Interval History:  Since last office visit she p 2 lbs.  Hunger/appetite-Skipping meals. We discussed the importance of regular meals and adequate protein intake for weight loss and overall health. Discussed protein shakes and recipes provided as noted above.  Cravings- Some in evenings. Discussed better snack options such as [place] instead of ice cream.  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  Sleep- Schedule is currently very disrupted. + Snoring as well and would like referral for sleep evaluation.  Exercise-nothing consistent currently.  Not meeting protein needs as skipping meals.   Has concerns about left leg swelling and discussed following up with PCP- D. Tysinger, PA-C, consider use of compression socks. Monitor sodium/excess salt intake. May need vascular evaluation as well and will defer to PCP.    Pharmacotherapy: None for weight loss On metformin in the past. - Stopped in 2020 due to skipping meals.   TREATMENT PLAN FOR OBESITY:  Recommended Dietary Goals  Margaret Porter is currently in the action stage of change. As such, her goal is to continue weight management plan. She has agreed to the Category 2 Plan.  Behavioral Intervention  We discussed the following Behavioral Modification Strategies today: increasing lean protein intake, decreasing simple carbohydrates , increasing vegetables, increasing lower glycemic fruits, increasing fiber rich foods, avoiding skipping meals, increasing water intake, keeping healthy foods at home, emotional eating strategies and understanding the difference between hunger signals and cravings, work on  managing stress, creating time for self-care and relaxation measures, continue to practice mindfulness when eating, and planning for success.  Additional resources provided today: Protein shake recipes above  Recommended Physical Activity Goals  Margaret Porter has been advised to work up to 150 minutes of moderate intensity aerobic activity a week and strengthening exercises 2-3 times per week for cardiovascular health, weight loss maintenance and preservation of muscle mass.   She has agreed to Think about ways to increase daily physical activity and overcoming barriers to exercise and Increase physical activity in their day and reduce sedentary time (increase NEAT).   Pharmacotherapy We discussed various medication options to help Euretha with her weight loss efforts and we both agreed to start topiramate for emotional eating/cravings.  On wegovy in the past    Return in about 4 weeks (  around 09/04/2023).[name] Kitchen She was informed of the importance of frequent follow up visits to maximize her success with intensive lifestyle modifications for her multiple health conditions.  PHYSICAL EXAM:  Blood pressure 119/82, pulse 77, temperature 98.1 F (36.7 C), height 5\' 3"  (1.6 m), weight 276 lb (125.2 kg), SpO2 95%. Body mass index is 48.89 kg/m.  General: She is overweight, cooperative, alert, well developed, and in no acute distress. PSYCH: Has normal mood, affect and thought process.  Cardiovascular: HR 70's , BP 119/82 Lungs: Normal breathing effort, no conversational dyspnea.  DIAGNOSTIC DATA REVIEWED:  BMET    Component Value Date/Time   NA 143 07/10/2023 1151   K 4.1 07/10/2023 1151   CL 103 07/10/2023 1151   CO2 23 07/10/2023 1151   GLUCOSE 97 07/10/2023 1151   GLUCOSE 96 10/31/2022 0819   BUN 14 07/10/2023 1151   CREATININE 1.12 (H) 07/10/2023 1151   CREATININE 0.95 05/10/2023 0850   CALCIUM 9.6 07/10/2023 1151   CALCIUM 9.6 04/09/2019 1534   GFRNONAA 65 06/06/2020 1240   GFRNONAA  >60 10/09/2019 1122   GFRNONAA 71 04/03/2019 0913   GFRAA 75 06/06/2020 1240   GFRAA >60 10/09/2019 1122   GFRAA 82 04/03/2019 0913   Lab Results  Component Value Date   HGBA1C 5.6 07/10/2023   HGBA1C 5.5 05/22/2013   Lab Results  Component Value Date   INSULIN 27.9 (H) 07/10/2023   INSULIN 21.1 11/12/2018   Lab Results  Component Value Date   TSH 1.900 07/10/2023   CBC    Component Value Date/Time   WBC 4.7 07/10/2023 1151   WBC 4.8 10/31/2022 0819   RBC 4.47 07/10/2023 1151   RBC 4.53 10/31/2022 0819   HGB 14.5 07/10/2023 1151   HCT 43.5 07/10/2023 1151   PLT 194 07/10/2023 1151   MCV 97 07/10/2023 1151   MCH 32.4 07/10/2023 1151   MCH 32.0 10/31/2022 0819   MCHC 33.3 07/10/2023 1151   MCHC 33.9 10/31/2022 0819   RDW 12.9 07/10/2023 1151   Iron Studies    Component Value Date/Time   IRON 60 01/13/2021 1016   Lipid Panel     Component Value Date/Time   CHOL 111 07/10/2023 1151   TRIG 96 07/10/2023 1151   HDL 38 (L) 07/10/2023 1151   CHOLHDL 3.4 10/31/2022 0819   VLDL 24 04/04/2017 0935   LDLCALC 55 07/10/2023 1151   LDLCALC 79 10/31/2022 0819   Hepatic Function Panel     Component Value Date/Time   PROT 6.4 07/10/2023 1151   ALBUMIN 4.3 07/10/2023 1151   AST 19 07/10/2023 1151   AST 18 10/09/2019 1122   ALT 18 07/10/2023 1151   ALT 20 10/09/2019 1122   ALKPHOS 148 (H) 07/10/2023 1151   BILITOT 0.4 07/10/2023 1151   BILITOT 0.4 10/09/2019 1122   BILIDIR 0.1 03/08/2021 0907   IBILI 0.3 12/22/2015 0001      Component Value Date/Time   TSH 1.900 07/10/2023 1151   Nutritional Lab Results  Component Value Date   VD25OH 56.8 07/10/2023   VD25OH 47.2 11/07/2022   VD25OH 61.6 01/18/2022    ASSOCIATED CONDITIONS ADDRESSED TODAY  ASSESSMENT AND PLAN  Problem List Items Addressed This Visit     Morbid obesity (HCC)   Vitamin D deficiency   Relevant Medications   Vitamin D, Ergocalciferol, (DRISDOL) 1.25 MG (50000 UNIT) CAPS capsule    Insulin resistance - Primary   Relevant Medications   topiramate (TOPAMAX) 25 MG tablet  BMI 45.0-49.9, adult (HCC) Current BMI 48.5   Other Visit Diagnoses     Disordered sleep       Relevant Orders   Ambulatory referral to Neurology      Insulin Resistance with cravings Labs were reviewed today and discussed with the patient.  Last fasting insulin was 27.9- not at goal, but improved overall. A1c was 5.6- slightly up, but not in prediabetic range. Polyphagia:No Medication(s): None She is working on nutrition plan to decrease simple carbohydrates, increase lean proteins and exercise to promote weight loss, improve glycemic control and prevent progression to Type 2 diabetes.  Patient denies history of glaucoma or kidney stones. She was informed of side effects and that topiramate is teratogenic. She uses condoms for birth control ?post menopausal.   Lab Results  Component Value Date   HGBA1C 5.6 07/10/2023   HGBA1C 5.5 11/07/2022   HGBA1C 5.2 01/18/2022   HGBA1C 5.2 07/06/2021   HGBA1C 5.3 01/26/2021   Lab Results  Component Value Date   INSULIN 27.9 (H) 07/10/2023   INSULIN 29.2 (H) 11/07/2022   INSULIN 30.5 (H) 01/18/2022   INSULIN 44.2 (H) 07/06/2021   INSULIN 43.5 (H) 01/26/2021    Plan: Start topamax 25 mg at bedtime and increase to 50 mg at bedtime nightly if tolerates well after 1 week.   Continue working on nutrition plan to decrease simple carbohydrates, increase lean proteins and exercise to promote weight loss, improve glycemic control/insulin resistance and prevent progression to Type 2 diabetes.   Vitamin D Deficiency Labs were reviewed today and discussed with the patient.  Vitamin D is at goal of 50.  Most recent vitamin D level was 56.8. She is on  prescription ergocalciferol 50,000 IU weekly. No N/V, no muscle weakness or other side effects with Ergocalciferol.  Lab Results  Component Value Date   VD25OH 56.8 07/10/2023   VD25OH 47.2 11/07/2022    VD25OH 61.6 01/18/2022    Plan: Continue and refill  prescription ergocalciferol 50,000 IU weekly Low vitamin D levels can be associated with adiposity and may result in leptin resistance and weight gain. Also associated with fatigue. Currently on vitamin D supplementation without any adverse effects.  Check vitamin D levels 3-4 times yearly to optimize supplementation/avoid over supplementation.   Disordered sleep:  Patient reports history of snoring and very disrupted sleep patterns overall.  Discussed use of melatonin use for short term to try and get back on schedule for start of school year.  Plan: Referral to neurology for sleep evaluation. Will follow with neurology/PCP.  Labs also reviewed with patient today and slight increase in Alk phosphatase and increased creatinine noted. Will need follow up over the next 1-2 months.   ATTESTASTION STATEMENTS:  Reviewed by clinician on day of visit: allergies, medications, problem list, medical history, surgical history, family history, social history, and previous encounter notes.   I have personally spent 40 minutes total time today in preparation, patient care, nutritional counseling and documentation for this visit, including the following: review of clinical lab tests; review of medical tests/procedures/services.       , PA-C

## 2023-08-19 ENCOUNTER — Other Ambulatory Visit: Payer: Self-pay

## 2023-08-19 ENCOUNTER — Emergency Department (HOSPITAL_COMMUNITY)
Admission: EM | Admit: 2023-08-19 | Discharge: 2023-08-19 | Disposition: A | Discharge status: Home / Self Care | Payer: BC Managed Care – PPO | Attending: Emergency Medicine | Admitting: Emergency Medicine

## 2023-08-19 ENCOUNTER — Encounter (HOSPITAL_COMMUNITY): Payer: Self-pay | Admitting: Cardiology

## 2023-08-19 ENCOUNTER — Emergency Department (HOSPITAL_COMMUNITY): Payer: BC Managed Care – PPO

## 2023-08-19 DIAGNOSIS — I4892 Unspecified atrial flutter: Secondary | ICD-10-CM | POA: Insufficient documentation

## 2023-08-19 DIAGNOSIS — I471 Supraventricular tachycardia, unspecified: Secondary | ICD-10-CM

## 2023-08-19 DIAGNOSIS — Z7901 Long term (current) use of anticoagulants: Secondary | ICD-10-CM | POA: Insufficient documentation

## 2023-08-19 DIAGNOSIS — R000 Tachycardia, unspecified: Secondary | ICD-10-CM | POA: Diagnosis present

## 2023-08-19 HISTORY — DX: Supraventricular tachycardia, unspecified: I47.10

## 2023-08-19 LAB — BASIC METABOLIC PANEL
Anion gap: 10 (ref 5–15)
BUN: 11 mg/dL (ref 6–20)
CO2: 23 mmol/L (ref 22–32)
Calcium: 8.9 mg/dL (ref 8.9–10.3)
Chloride: 107 mmol/L (ref 98–111)
Creatinine, Ser: 1.17 mg/dL — ABNORMAL HIGH (ref 0.44–1.00)
GFR, Estimated: 55 mL/min — ABNORMAL LOW (ref >60)
Glucose, Bld: 112 mg/dL — ABNORMAL HIGH (ref 70–99)
Potassium: 3.9 mmol/L (ref 3.5–5.1)
Sodium: 140 mmol/L (ref 135–145)

## 2023-08-19 LAB — CBC WITH DIFFERENTIAL/PLATELET
Abs Immature Granulocytes: 0.02 10*3/uL (ref 0.00–0.07)
Basophils Absolute: 0 10*3/uL (ref 0.0–0.1)
Basophils Relative: 0 %
Eosinophils Absolute: 0 10*3/uL (ref 0.0–0.5)
Eosinophils Relative: 0 %
HCT: 44.1 % (ref 36.0–46.0)
Hemoglobin: 14.8 g/dL (ref 12.0–15.0)
Immature Granulocytes: 0 %
Lymphocytes Relative: 23 %
Lymphs Abs: 1.6 10*3/uL (ref 0.7–4.0)
MCH: 33.3 pg (ref 26.0–34.0)
MCHC: 33.6 g/dL (ref 30.0–36.0)
MCV: 99.3 fL (ref 80.0–100.0)
Monocytes Absolute: 0.6 10*3/uL (ref 0.1–1.0)
Monocytes Relative: 8 %
Neutro Abs: 4.8 10*3/uL (ref 1.7–7.7)
Neutrophils Relative %: 69 %
Platelets: 225 10*3/uL (ref 150–400)
RBC: 4.44 MIL/uL (ref 3.87–5.11)
RDW: 12.9 % (ref 11.5–15.5)
WBC: 7 10*3/uL (ref 4.0–10.5)
nRBC: 0 % (ref 0.0–0.2)

## 2023-08-19 LAB — MAGNESIUM: Magnesium: 2 mg/dL (ref 1.7–2.4)

## 2023-08-19 MED ORDER — APIXABAN 5 MG PO TABS: 5.0000 mg | ORAL_TABLET | Freq: Two times a day (BID) | ORAL | 0 refills | Status: DC

## 2023-08-19 MED ORDER — METOPROLOL SUCCINATE ER 25 MG PO TB24: 25.0000 mg | ORAL_TABLET | Freq: Every day | ORAL | 0 refills | Status: DC

## 2023-08-19 MED ORDER — LACTATED RINGERS IV BOLUS
500.0000 mL | Freq: Once | INTRAVENOUS | Status: AC
  Administered 2023-08-19: 500 mL via INTRAVENOUS

## 2023-08-19 NOTE — ED Provider Notes (Signed)
[place] EMERGENCY DEPARTMENT AT [facility] Provider Note   CSN: [id number] Arrival date & time: 08/19/23  1556     History  Chief Complaint  Patient presents with   Tachycardia    Margaret Porter is a 53 y.o. female.  HPI 53 year old female history of prediabetes presenting for palpitations patient states she was eating lunch today when she developed sudden onset palpitations and felt like she was quite anxious.  No chest pain or shortness of breath or dizziness.  With EMS there were concern for SVT and gave her 2 rounds of adenosine reportedly resulting in a period of asystole and syncope.  No reported CPR or ACLS measures patient responded spontaneously.  She was then given Cardizem which led to concern for A-fib in the normal sinus rhythm as well as some fluids.  She now is asymptomatic.  No history of A-fib, a flutter or other arrhythmia.  Has not had any chest pain, shortness of breath, syncope at all.  No recent fevers or vomiting or diarrhea.  Normal p.o. intake.     Home Medications Prior to Admission medications   Medication Sig Start Date End Date Taking? Authorizing Provider  apixaban (ELIQUIS) 5 MG TABS tablet Take 1 tablet (5 mg total) by mouth 2 (two) times daily. 08/19/23 09/18/23 Yes Laurence Spates, MD  metoprolol succinate (TOPROL-XL) 25 MG 24 hr tablet Take 1 tablet (25 mg total) by mouth daily. 08/19/23  Yes Laurence Spates, MD  Pitavastatin Calcium 4 MG TABS Take 1 tablet (4 mg total) by mouth daily. 11/14/22  Yes Cliffton Asters, MD  diphenhydrAMINE (BENADRYL) 25 MG tablet Take 25 mg by mouth every 6 (six) hours as needed for itching or allergies. Reported on 02/29/2016    [provider]  Study - [facility] [phone]) [phone] (dolutegravir/lamivudine) 50-300 mg tablet ([place]) Take 1 tablet by mouth daily. 06/17/23   Randall Hiss, MD  topiramate (TOPAMAX) 25 MG tablet Take 1 tablet (25 mg total) by mouth at bedtime. 25 mg  at bedtime nightly x 1 week and if tolerating well, increase to 50 mg at bedtime. 08/07/23   Rayburn, Fanny Bien, PA-C  Vitamin D, Ergocalciferol, (DRISDOL) 1.25 MG (50000 UNIT) CAPS capsule Take 1 capsule (50,000 Units total) by mouth every 7 (seven) days. 08/07/23   Rayburn, Fanny Bien, PA-C      Allergies    Molds & smuts, Oxycodone, Red dye #40 (allura red), Wellbutrin [bupropion], Amoxicillin, Aspirin, and Sulfamethoxazole-trimethoprim    Review of Systems   Review of Systems Review of systems completed and notable as per HPI.  ROS otherwise negative.   Physical Exam Updated Vital Signs BP (!) 109/95   Pulse 68   Temp 98.7 F (37.1 C) (Oral)   Resp (!) 22   Ht 5\' 3"  (1.6 m)   Wt 125.2 kg   SpO2 94%   BMI 48.89 kg/m  Physical Exam Vitals and nursing note reviewed.  Constitutional:      General: She is not in acute distress.    Appearance: She is well-developed.  HENT:     Head: Normocephalic and atraumatic.     Nose: Nose normal.     Mouth/Throat:     Mouth: Mucous membranes are moist.     Pharynx: Oropharynx is clear.  Eyes:     Extraocular Movements: Extraocular movements intact.     Conjunctiva/sclera: Conjunctivae normal.     Pupils: Pupils are equal, round, and reactive to light.  Cardiovascular:     Rate and Rhythm: Normal rate and regular rhythm.     Heart sounds: No murmur heard. Pulmonary:     Effort: Pulmonary effort is normal. No respiratory distress.     Breath sounds: Normal breath sounds.  Abdominal:     Palpations: Abdomen is soft.     Tenderness: There is no abdominal tenderness.  Musculoskeletal:        General: No swelling.     Cervical back: Neck supple.     Right lower leg: No edema.     Left lower leg: No edema.  Skin:    General: Skin is warm and dry.     Capillary Refill: Capillary refill takes less than 2 seconds.  Neurological:     Mental Status: She is alert.  Psychiatric:        Mood and Affect: Mood normal.     ED  Results / Procedures / Treatments   Labs (all labs ordered are listed, but only abnormal results are displayed) Labs Reviewed  BASIC METABOLIC PANEL - Abnormal; Notable for the following components:      Result Value   Glucose, Bld 112 (*)    Creatinine, Ser 1.17 (*)    GFR, Estimated 55 (*)    All other components within normal limits  CBC WITH DIFFERENTIAL/PLATELET  MAGNESIUM    EKG EKG Interpretation Date/Time:  Monday August 19 2023 16:05:31 EDT Ventricular Rate:  89 PR Interval:  143 QRS Duration:  79 QT Interval:  349 QTC Calculation: 425 R Axis:   53  Text Interpretation: Sinus rhythm Borderline T wave abnormalities No acute changes Confirmed by Anders Simmonds [phone]) on 08/19/2023 4:14:43 PM  Radiology DG Chest 2 View  Result Date: 08/19/2023 CLINICAL DATA:  Tachy arrhythmia EXAM: CHEST - 2 VIEW COMPARISON:  09/25/2022 FINDINGS: The heart size and mediastinal contours are within normal limits. Both lungs are clear. The visualized skeletal structures are unremarkable. IMPRESSION: No active cardiopulmonary disease. Electronically Signed   By: Charlett Nose M.D.   On: 08/19/2023 18:06    Procedures Procedures    Medications Ordered in ED Medications  lactated ringers bolus 500 mL (0 mLs Intravenous Stopped 08/19/23 1858)    ED Course/ Medical Decision Making/ A&P Clinical Course as of 08/19/23 2328  Mon Aug 19, 2023  1750 Cards: [facility], close OP follow up,  [JD]    Clinical Course User Index [JD] Laurence Spates, MD                                 Medical Decision Making Amount and/or Complexity of Data Reviewed Labs: ordered. Radiology: ordered.  Risk Prescription drug management.   Medical Decision Making:   Margaret Porter is a 53 y.o. female who presented to the ED today with episode of palpitations.  Vital signs reviewed.  Here she is in normal sinus rhythm and currently is asymptomatic.  She reports episode of palpitations.  I reviewed her rhythm  strips, she appeared to have an episode of atrial flutter.  She did have a period of asystole approximately 10 seconds likely after adenosine which resolved on its own.  She is asymptomatic and has remained asymptomatic here.  No chest pain, low concern for ACS.  Electrolytes are unremarkable.  No shortness of breath, chest pain, hypoxia or tachycardia now, low concern for PE.   Patient placed on continuous vitals and telemetry monitoring while in  ED which was reviewed periodically.  Reviewed and confirmed nursing documentation for past medical history, family history, social history.  Initial Study Results:   Laboratory  All laboratory results reviewed.  Labs unremarkable  EKG EKG was reviewed independently. Rate, rhythm, axis, intervals all examined and without medically relevant abnormality. ST segments without concerns for elevations.    Radiology:  All images reviewed independently. Agree with radiology report at this time.      Consults: Case discussed with cardiology.   Reassessment and Plan:   Workup appears reassuring.  I discussed with cardiology Dr. Eden Emms who recommended starting anticoagulation, metoprolol and close outpatient follow-up.  I discussed this with the patient including her new medications.  Start on Eliquis, metoprolol.  She is asymptomatic has not had any recurrence of symptoms.  Discharge strict turn precautions.   Patient's presentation is most consistent with acute complicated illness / injury requiring diagnostic workup.           Final Clinical Impression(s) / ED Diagnoses Final diagnoses:  Atrial flutter, unspecified type (HCC)    Rx / DC Orders ED Discharge Orders          Ordered    apixaban (ELIQUIS) 5 MG TABS tablet  2 times daily        08/19/23 1843    metoprolol succinate (TOPROL-XL) 25 MG 24 hr tablet  Daily        08/19/23 1843              Laurence Spates, MD 08/19/23 2328

## 2023-08-19 NOTE — ED Notes (Signed)
This RN reviewed discharge instructions with patient. She verbalized understanding and denied any further questions. PT well appearing upon discharge and reports tolerable pain. Pt ambulated with stable gait to exit. Pt endorses ride home.  

## 2023-08-19 NOTE — Discharge Instructions (Addendum)
You had an episode of atrial flutter today.  You should receive a phone call tomorrow from cardiology, if you do not receive a phone call call the numbers below to schedule follow-up.  You are being placed on a blood thinner as well as a medication to decrease your heart rate.  You should call your primary care doctor to follow-up as well.  You develop repeat palpitations, chest pain, difficulty breathing, fainting you should return to the ED.  [phone]

## 2023-08-19 NOTE — ED Triage Notes (Addendum)
Pt BIB EMS from [facility] but lives at home for palpitations after having lunch. Upon EMS arrival pt was in SVT, was given 6 of adenosine without success, 12 of adenosine resulting in 1 mins of asystole and syncope (no mention of CPR). Then was then given 20 Cardizem resulting in 80 HR that went from afib to NSR. EMS also gave 500 NS.   EMS VS 126/87 99% 6lpm [place] BG 134 (non dm 500 NS 20 R AC

## 2023-08-19 NOTE — ED Notes (Signed)
Pt to xray. Well appearing upon transport.

## 2023-08-27 ENCOUNTER — Encounter (INDEPENDENT_AMBULATORY_CARE_PROVIDER_SITE_OTHER): Payer: Self-pay | Admitting: Physician Assistant

## 2023-08-27 ENCOUNTER — Ambulatory Visit (HOSPITAL_COMMUNITY)
Admission: RE | Admit: 2023-08-27 | Discharge: 2023-08-27 | Disposition: A | Discharge status: Home / Self Care | Payer: BC Managed Care – PPO | Source: Ambulatory Visit | Attending: Internal Medicine | Admitting: Internal Medicine

## 2023-08-27 VITALS — BP 132/76 | HR 65 | Ht 63.0 in | Wt 286.6 lb

## 2023-08-27 DIAGNOSIS — E785 Hyperlipidemia, unspecified: Secondary | ICD-10-CM | POA: Diagnosis not present

## 2023-08-27 DIAGNOSIS — B20 Human immunodeficiency virus [HIV] disease: Secondary | ICD-10-CM | POA: Diagnosis not present

## 2023-08-27 DIAGNOSIS — R7303 Prediabetes: Secondary | ICD-10-CM | POA: Insufficient documentation

## 2023-08-27 DIAGNOSIS — I483 Typical atrial flutter: Secondary | ICD-10-CM | POA: Diagnosis not present

## 2023-08-27 DIAGNOSIS — I4891 Unspecified atrial fibrillation: Secondary | ICD-10-CM | POA: Insufficient documentation

## 2023-08-27 DIAGNOSIS — I4892 Unspecified atrial flutter: Secondary | ICD-10-CM | POA: Diagnosis present

## 2023-08-27 MED ORDER — METOPROLOL SUCCINATE ER 25 MG PO TB24: 12.5000 mg | ORAL_TABLET | Freq: Every day | ORAL | 3 refills | Status: DC

## 2023-08-27 NOTE — Patient Instructions (Signed)
Start metoprolol 1/2 tablet once a day at bedtime

## 2023-08-27 NOTE — Progress Notes (Signed)
Primary Care Physician: Jac Canavan, PA-C Primary Cardiologist: None Electrophysiologist: None     Referring Physician: ED     Margaret Porter is a 53 y.o. female with a history of HIV, HLD, prediabetes, and paroxysmal atrial flutter who presents for consultation in the [facility]. Recent ED visit on 08/19/23 for palpitations with concern to be in SVT; received 2 rounds of adenosine with reported period of asystole and syncope (no ACLS performed). Discharged on Toprol 25 mg daily. Patient is on Eliquis 5 mg BID for a CHADS2VASC score of 1.  On evaluation today, she is currently in NSR. She was having a smashburger at [employer] when she had onset of feeling anxious and heart racing. She snores and is scheduled for a sleep consult. She does not drink caffeine but does admit to drinking some sodas. She did not begin Eliquis. She stopped taking Toprol because it was making her feel tired.   Today, she denies symptoms of palpitations, chest pain, shortness of breath, orthopnea, PND, lower extremity edema, dizziness, presyncope, syncope, snoring, daytime somnolence, bleeding, or neurologic sequela. The patient is tolerating medications without difficulties and is otherwise without complaint today.   EMS ECG:     Atrial Fibrillation Risk Factors:  she does have symptoms or diagnosis of sleep apnea. she is scheduled for a sleep consult.   she has a BMI of Body mass index is 50.77 kg/m.[name] Kitchen Filed Weights   08/27/23 1050  Weight: 130 kg    Current Outpatient Medications  Medication Sig Dispense Refill   diphenhydrAMINE (BENADRYL) 25 MG tablet Take 25 mg by mouth every 6 (six) hours as needed for itching or allergies. Reported on 02/29/2016     Pitavastatin Calcium 4 MG TABS Take 1 tablet (4 mg total) by mouth daily. 30 tablet 11   Study - [facility] [phone]) - [id number] (dolutegravir/lamivudine) 50-300 mg tablet (PI-[place]) Take 1 tablet by mouth  daily. 180 tablet 0   Vitamin D, Ergocalciferol, (DRISDOL) 1.25 MG (50000 UNIT) CAPS capsule Take 1 capsule (50,000 Units total) by mouth every 7 (seven) days. 4 capsule 0   metoprolol succinate (TOPROL-XL) 25 MG 24 hr tablet Take 0.5 tablets (12.5 mg total) by mouth at bedtime. 15 tablet 3   topiramate (TOPAMAX) 25 MG tablet Take 1 tablet (25 mg total) by mouth at bedtime. 25 mg at bedtime nightly x 1 week and if tolerating well, increase to 50 mg at bedtime. (Patient not taking: Reported on 08/27/2023) 45 tablet 0   No current facility-administered medications for this encounter.    Atrial Fibrillation Management history:  Previous antiarrhythmic drugs: None Previous cardioversions: None Previous ablations: None Anticoagulation history: Eliquis   ROS- All systems are reviewed and negative except as per the HPI above.  Physical Exam: BP 132/76   Pulse 65   Ht 5\' 3"  (1.6 m)   Wt 130 kg   BMI 50.77 kg/m   GEN: Well nourished, well developed in no acute distress NECK: No JVD; No carotid bruits CARDIAC: Regular rate and rhythm, no murmurs, rubs, gallops RESPIRATORY:  Clear to auscultation without rales, wheezing or rhonchi  ABDOMEN: Soft, non-tender, non-distended EXTREMITIES:  No edema; No deformity   EKG today demonstrates  Vent. rate 65 BPM PR interval 138 ms QRS duration 78 ms QT/QTcB 408/424 ms P-R-T axes 6 -7 76 Normal sinus rhythm Nonspecific T wave abnormality Abnormal ECG When compared with ECG of 19-Aug-2023 16:05, PREVIOUS ECG IS PRESENT  Echo N/A  ASSESSMENT & PLAN CHA2DS2-VASc Score = 1  The patient's score is based upon: CHF History: 0 HTN History: 0 Diabetes History: 0 Stroke History: 0 Vascular Disease History: 0 Age Score: 0 Gender Score: 1       ASSESSMENT AND PLAN: Paroxysmal Atrial Flutter The patient's CHA2DS2-VASc score is 1, indicating a 0.6% annual risk of stroke.    Education about atrial flutter provided. She does not meet criteria  for anticoagulation and so okay to not take at this time. After discussion, she will try taking Toprol 12.5 mg at bedtime to see if tolerates. Rhythm monitoring device recommended. She is already scheduled for a sleep consult. She is going to try and cut back on soda intake. We will schedule her for a baseline echocardiogram.     Follow up 3 months Afib clinic.    Lake Bells, PA-C  Afib Clinic [facility] [address] [place], [place] [address] [phone]

## 2023-08-28 NOTE — Telephone Encounter (Signed)
Please reschedule

## 2023-09-01 ENCOUNTER — Other Ambulatory Visit (INDEPENDENT_AMBULATORY_CARE_PROVIDER_SITE_OTHER): Payer: Self-pay | Admitting: Physician Assistant

## 2023-09-01 DIAGNOSIS — E559 Vitamin D deficiency, unspecified: Secondary | ICD-10-CM

## 2023-09-03 ENCOUNTER — Other Ambulatory Visit (INDEPENDENT_AMBULATORY_CARE_PROVIDER_SITE_OTHER): Payer: Self-pay | Admitting: Physician Assistant

## 2023-09-03 DIAGNOSIS — E88819 Insulin resistance, unspecified: Secondary | ICD-10-CM

## 2023-09-05 ENCOUNTER — Ambulatory Visit (HOSPITAL_COMMUNITY)
Admission: RE | Admit: 2023-09-05 | Discharge: 2023-09-05 | Disposition: A | Discharge status: Home / Self Care | Payer: BC Managed Care – PPO | Source: Ambulatory Visit | Attending: Internal Medicine | Admitting: Internal Medicine

## 2023-09-05 ENCOUNTER — Ambulatory Visit (INDEPENDENT_AMBULATORY_CARE_PROVIDER_SITE_OTHER): Payer: BC Managed Care – PPO | Admitting: Physician Assistant

## 2023-09-05 DIAGNOSIS — I4891 Unspecified atrial fibrillation: Secondary | ICD-10-CM | POA: Diagnosis present

## 2023-09-05 HISTORY — PX: DOPPLER ECHOCARDIOGRAPHY: SHX263

## 2023-09-05 LAB — ECHOCARDIOGRAM COMPLETE
Area-P 1/2: 3.91 cm2
Calc EF: 61.4 %
S' Lateral: 2.1 cm
Single Plane A2C EF: 61.6 %
Single Plane A4C EF: 61 %

## 2023-09-16 ENCOUNTER — Encounter: Payer: Self-pay | Admitting: Neurology

## 2023-09-16 ENCOUNTER — Ambulatory Visit (INDEPENDENT_AMBULATORY_CARE_PROVIDER_SITE_OTHER): Payer: BC Managed Care – PPO | Admitting: Neurology

## 2023-09-16 VITALS — BP 129/73 | HR 63 | Ht 63.0 in | Wt 286.0 lb

## 2023-09-16 DIAGNOSIS — R0683 Snoring: Secondary | ICD-10-CM | POA: Diagnosis not present

## 2023-09-16 DIAGNOSIS — G4700 Insomnia, unspecified: Secondary | ICD-10-CM

## 2023-09-16 DIAGNOSIS — Z9189 Other specified personal risk factors, not elsewhere classified: Secondary | ICD-10-CM

## 2023-09-16 DIAGNOSIS — I4892 Unspecified atrial flutter: Secondary | ICD-10-CM

## 2023-09-16 DIAGNOSIS — Z6841 Body Mass Index (BMI) 40.0 and over, adult: Secondary | ICD-10-CM

## 2023-09-16 NOTE — Progress Notes (Signed)
Subjective:    Patient ID: Margaret Porter is a 53 y.o. female.  HPI    Huston Foley, MD, PhD [facility] [address] [address] [place], [place] [address]  Dear [name],  I saw your patient, Margaret Porter, upon your kind request, in my sleep clinic today for initial consultation of her sleep disorder, in particular, concern for underlying obstructive sleep apnea.  The patient is unaccompanied today.  As you know, [name] is a 53 year old female with an underlying medical history of vitamin D deficiency, allergic rhinitis, prediabetes, sickle cell trait, lower extremity swelling, paroxysmal atrial flutter, insulin resistance, HIV disease, and morbid obesity with a BMI of over 45, who reports snoring and sleep disruption, nonrestorative sleep and daytime tiredness.  Her Epworth sleepiness score is 7 out of 24, fatigue severity score is 41 out of 63.  I reviewed your office note from 08/07/2023.  For the past few months she has had difficulty initiating and maintaining sleep.  She went to the ER in August with palpitations and tachycardia, was treated for SVT and suspected to have A-fib, was noted to have atrial flutter.  She recently saw cardiology and is no longer on Eliquis, she is on a low-dose of metoprolol.  She is working on weight loss and follows with the healthy weight and wellness clinic.  She is single and lives alone, no children.  No pets in the house.  She is not aware of any family history of sleep apnea.  She has trouble going to sleep and staying asleep for the past few months but has not tried any over-the-counter medications.  She tries to read before she falls asleep.  Bedtime is around midnight, she has tried to move up her bedtime, she is to go to bed even later.  Rise time is between 5:30 AM and 6:30 AM.  She sets at least 2 alarms.  She is a non-smoker and drinks alcohol occasionally, less than once a week, she drinks no daily caffeine.   She has a TV in her bedroom but it is typically not on at night.  She has intermittent nasal congestion and intermittent coughing, has been advised that it could be reflux disease per PCP, she is currently not on any medications for acid reflux.  She has no nightly nocturia and denies any recurrent nocturnal or morning headaches.  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   Her Past Medical History Is Significant For: Past Medical History:  Diagnosis Date   Allergic rhinitis    Allergy    Food allergy    HIV infection (HCC) 2006   Idiopathic urticaria    Joint pain    Lytic bone lesions on xray    bone lesions on hip,sternum and spine   Obesity    Pre-diabetes    pt not on meds   Rosacea    Sickle cell anemia (HCC)    Sickle cell trait with mom   SVT (supraventricular tachycardia)    Swelling of both lower extremities    per pt, left side is more swollen   Vitamin D deficiency     Her Past Surgical History Is Significant For: Past Surgical History:  Procedure Laterality Date   COLONOSCOPY  03/09/2019   diverticulosis sigmoid, int hemorrhoids, Dr. Marsa Aris   DILATATION & CURRETTAGE/HYSTEROSCOPY WITH RESECTOCOPE N/A 06/27/2015   Procedure: DILATATION & CURETTAGE/HYSTEROSCOPY WITH RESECTOCOPE;  Surgeon: Maxie Better, MD;  Location: [facility];  Service: Gynecology;  Laterality: N/A;   LAPAROSCOPIC APPENDECTOMY N/A 09/07/2017   Procedure: APPENDECTOMY LAPAROSCOPIC;  Surgeon: Berna Bue, MD;  Location: [facility];  Service: General;  Laterality: N/A;    Her Family History Is Significant For: Family History  Problem Relation Age of Onset   Heart disease Mother        cardiomyopathy related to covid infection   Diabetes Mother    Heart disease Father        CAD, considering pacemaker 2023   Diabetes Father    High blood pressure Father    Arrhythmia Father    Diabetes Brother    Heart disease Paternal Uncle    Heart disease  Maternal Grandmother    Cancer Maternal Grandmother        breast   Heart disease Paternal Grandmother    Cancer Paternal Grandmother        breast   Colon cancer Neg Hx    Colon polyps Neg Hx    Esophageal cancer Neg Hx    Rectal cancer Neg Hx    Stomach cancer Neg Hx    Sleep apnea Neg Hx     Her Social History Is Significant For: Social History   Socioeconomic History   Marital status: Single    Spouse name: Not on file   Number of children: Not on file   Years of education: Not on file   Highest education level: Not on file  Occupational History   Occupation: Banker  Tobacco Use   Smoking status: Never   Smokeless tobacco: Never  Vaping Use   Vaping status: Never Used  Substance and Sexual Activity   Alcohol use: Not Currently    Comment: occasional (special occasions)   Drug use: No   Sexual activity: Never    Partners: Male    Birth control/protection: Condom  Other Topics Concern   Not on file  Social History Glass blower/designer, 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   Single, no children   Exercise - walking   12/2022   Social Determinants of Health   Financial Resource Strain: Not on file  Food Insecurity: Not on file  Transportation Needs: Not on file  Physical Activity: Not on file  Stress: Not on file  Social Connections: Not on file    Her Allergies Are:  Allergies  Allergen Reactions   Molds & Smuts     SOB, chest congestion   Oxycodone     Questionable mouth itching, sweats, but was also simultaneously on Cipro, Flagyl, and Robaxin.   Red Dye #40 (Allura Red)     Caused a rash with large amounts   Wellbutrin [Bupropion] Other (See Comments)    Eyes swelled 3 hours after taking one dose   Amoxicillin Rash   Aspirin Anxiety   Sulfamethoxazole-Trimethoprim Rash  :   Her Current Medications Are:  Outpatient Encounter Medications as of 09/16/2023  Medication Sig   diphenhydrAMINE (BENADRYL) 25 MG tablet Take 25  mg by mouth every 6 (six) hours as needed for itching or allergies. Reported on 02/29/2016   metoprolol succinate (TOPROL-XL) 25 MG 24 hr tablet Take 0.5 tablets (12.5 mg total) by mouth at bedtime.   Pitavastatin Calcium 4 MG TABS Take 1 tablet (4 mg total) by mouth daily.   Study - [facility] [phone]) - [id number] (dolutegravir/lamivudine) 50-300 mg tablet ([place]) Take 1 tablet by mouth daily.   Vitamin D,  Ergocalciferol, (DRISDOL) 1.25 MG (50000 UNIT) CAPS capsule Take 1 capsule (50,000 Units total) by mouth every 7 (seven) days.   topiramate (TOPAMAX) 25 MG tablet Take 1 tablet (25 mg total) by mouth at bedtime. 25 mg at bedtime nightly x 1 week and if tolerating well, increase to 50 mg at bedtime. (Patient not taking: Reported on 08/27/2023)   No facility-administered encounter medications on file as of 09/16/2023.  :   Review of Systems:  Out of a complete 14 point review of systems, all are reviewed and negative with the exception of these symptoms as listed below:    Review of Systems  Neurological:        Pt here for sleep consult Pt snores,fatigue,Pt denies sleep study,CPAP machine,headaches,hypertension    ESS:7 FSS:41    Objective:  Neurological Exam  Physical Exam Physical Examination:   Vitals:   09/16/23 1227  BP: 129/73  Pulse: 63    General Examination: The patient is a very pleasant 53 y.o. female in no acute distress. She appears well-developed and well-nourished and well groomed.   HEENT: Normocephalic, atraumatic, pupils are equal, round and reactive to light, extraocular tracking is good without limitation to gaze excursion or nystagmus noted. Hearing is grossly intact. Face is symmetric with normal facial animation. Speech is clear with no dysarthria noted. There is no hypophonia. There is no lip, neck/head, jaw or voice tremor. Neck is supple with full range of passive and active motion. There are no carotid bruits on auscultation. Oropharynx exam  reveals: moderate mouth dryness, adequate dental hygiene with several missing teeth in the back top and bottom and moderate airway crowding, due to Mallampati class II, longer uvula, wider tongue, tonsillar size of 1+ bilaterally.  Tongue protrudes centrally and palate elevates symmetrically, neck circumference 16-1/8 inches, she has a mild underbite.  Chest: Clear to auscultation without wheezing, rhonchi or crackles noted.  Heart: S1+S2+0, regular and normal without murmurs, rubs or gallops noted.   Abdomen: Soft, non-tender and non-distended.  Extremities: There is nonpitting puffiness in both lower extremities, left leg larger in caliber compared to right, not new per patient.     Skin: Warm and dry without trophic changes noted.   Musculoskeletal: exam reveals no obvious joint deformities.   Neurologically:  Mental status: The patient is awake, alert and oriented in all 4 spheres. Her immediate and remote memory, attention, language skills and fund of knowledge are appropriate. There is no evidence of aphasia, agnosia, apraxia or anomia. Speech is clear with normal prosody and enunciation. Thought process is linear. Mood is normal and affect is normal.  Cranial nerves II - XII are as described above under HEENT exam.  Motor exam: Normal bulk, strength and tone is noted. There is no obvious action or resting tremor.  Fine motor skills and coordination: grossly intact.  Cerebellar testing: No dysmetria or intention tremor. There is no truncal or gait ataxia.  Sensory exam: intact to light touch in the upper and lower extremities.  Gait, station and balance: She stands easily. No veering to one side is noted. No leaning to one side is noted. Posture is age-appropriate and stance is narrow based. Gait shows normal stride length and normal pace. No problems turning are noted.   Assessment and Plan:  In summary, JONICE LEWKOWICZ is a very pleasant 53 y.o.-year old female with an underlying  medical history of vitamin D deficiency, allergic rhinitis, prediabetes, sickle cell trait, lower extremity swelling, paroxysmal atrial flutter, insulin resistance,  HIV disease, and morbid obesity with a BMI of over 45, whose history and physical exam are concerning for sleep disordered breathing, particularly obstructive sleep apnea (OSA).  While a laboratory attended sleep study is typically considered "gold standard" for evaluation of sleep disordered breathing, we mutually agreed to proceed with a home sleep test at this time.   I had a long chat with the patient about my findings and the diagnosis of sleep apnea, particularly OSA, its prognosis and treatment options. We talked about medical/conservative treatments, surgical interventions and non-pharmacological approaches for symptom control. I explained, in particular, the risks and ramifications of untreated moderate to severe OSA, especially with respect to developing cardiovascular disease down the road, including congestive heart failure (CHF), difficult to treat hypertension, cardiac arrhythmias (particularly A-fib), neurovascular complications including TIA, stroke and dementia. Even type 2 diabetes has, in part, been linked to untreated OSA. Symptoms of untreated OSA may include (but may not be limited to) daytime sleepiness, nocturia (i.e. frequent nighttime urination), memory problems, mood irritability and suboptimally controlled or worsening mood disorder such as depression and/or anxiety, lack of energy, lack of motivation, physical discomfort, as well as recurrent headaches, especially morning or nocturnal headaches. We talked about the importance of maintaining a healthy lifestyle and striving for healthy weight. In addition, we talked about the importance of striving for and maintaining good sleep hygiene. I recommended a sleep study at this time. I outlined the differences between a laboratory attended sleep study which is considered more  comprehensive and accurate over the option of a home sleep test (HST); the latter may lead to underestimation of sleep disordered breathing in some instances and does not help with diagnosing upper airway resistance syndrome and is not accurate enough to diagnose primary central sleep apnea typically. I outlined possible surgical and non-surgical treatment options of OSA, including the use of a positive airway pressure (PAP) device (i.e. CPAP, AutoPAP/APAP or BiPAP in certain circumstances), a custom-made dental device (aka oral appliance, which would require a referral to a specialist dentist or orthodontist typically, and is generally speaking not considered for patients with full dentures or edentulous state), upper airway surgical options, such as traditional UPPP (which is not considered a first-line treatment) or the Inspire device (hypoglossal nerve stimulator, which would involve a referral for consultation with an ENT surgeon, after careful selection, following inclusion criteria - also not first-line treatment). I explained the PAP treatment option to the patient in detail, as this is generally considered first-line treatment.  The patient indicated that she would be willing to try PAP therapy, if the need arises. I explained the importance of being compliant with PAP treatment, not only for insurance purposes but primarily to improve patient's symptoms symptoms, and for the patient's long term health benefit, including to reduce Her cardiovascular risks longer-term.    We will pick up our discussion about the next steps and treatment options after testing.  We will keep her posted as to the test results by phone call and/or MyChart messaging where possible.  We will plan to follow-up in sleep clinic accordingly as well.  I answered all her questions today and the patient was in agreement.   I encouraged her to call with any interim questions, concerns, problems or updates or email [name] through MyChart.   Generally speaking, sleep test authorizations may take up to 2 weeks, sometimes less, sometimes longer, the patient is encouraged to get in touch with [name] if they do not hear back from the  sleep lab staff directly within the next 2 weeks.  Thank you very much for allowing me to participate in the care of this nice patient. If I can be of any further assistance to you please do not hesitate to call me at [phone].  Sincerely,   Huston Foley, MD, PhD

## 2023-09-16 NOTE — Patient Instructions (Signed)
Thank you for choosing [facility] for your sleep related care! It was nice to meet you today!   Here is what we discussed today:    Based on your symptoms and your exam I believe you are at risk for obstructive sleep apnea (aka OSA). We should proceed with a sleep study to determine whether you do or do not have OSA and how severe it is. Even, if you have mild OSA, I may want you to consider treatment with CPAP, as treatment of even borderline or mild sleep apnea can result and improvement of symptoms such as sleep disruption, daytime sleepiness, nighttime bathroom breaks, restless leg symptoms, improvement of headache syndromes, even improved mood disorder.   As explained, an attended sleep study (meaning you get to stay overnight in the sleep lab), lets [name] monitor sleep-related behaviors such as sleep talking and leg movements in sleep, in addition to monitoring for sleep apnea.  A home sleep test is a screening tool for sleep apnea diagnosis only, but unfortunately, does not help with any other sleep-related diagnoses.  Please remember, the long-term risks and ramifications of untreated moderate to severe obstructive sleep apnea may include (but are not limited to): increased risk for cardiovascular disease, including congestive heart failure, stroke, difficult to control hypertension, treatment resistant obesity, arrhythmias, especially irregular heartbeat commonly known as A. Fib. (atrial fibrillation); even type 2 diabetes has been linked to untreated OSA.   Other correlations that untreated obstructive sleep apnea include macular edema which is swelling of the retina in the eyes, droopy eyelid syndrome, and elevated hemoglobin and hematocrit levels (often referred to as polycythemia).  Sleep apnea can cause disruption of sleep and sleep deprivation in most cases, which, in turn, can cause recurrent headaches, problems with memory, mood, concentration, focus, and vigilance. Most  people with untreated sleep apnea report excessive daytime sleepiness, which can affect their ability to drive. Please do not drive or use heavy equipment or machinery, if you feel sleepy! Patients with sleep apnea can also develop difficulty initiating and maintaining sleep (aka insomnia).   Having sleep apnea may increase your risk for other sleep disorders, including involuntary behaviors sleep such as sleep terrors, sleep talking, sleepwalking.    Having sleep apnea can also increase your risk for restless leg syndrome and leg movements at night.   Please note that untreated obstructive sleep apnea may carry additional perioperative morbidity. Patients with significant obstructive sleep apnea (typically, in the moderate to severe degree) should receive, if possible, perioperative PAP (positive airway pressure) therapy and the surgeons and particularly the anesthesiologists should be informed of the diagnosis and the severity of the sleep disordered breathing.   We will call you or email you through MyChart with regards to your test results and plan a follow-up in sleep clinic accordingly. Most likely, you will hear from one of our nurses.   Our sleep lab administrative assistant will call you to schedule your sleep study and give you further instructions, regarding the check in process for the sleep study, arrival time, what to bring, when you can expect to leave after the study, etc., and to answer any other logistical questions you may have. If you don't hear back from her by about 2 weeks from now, please feel free to call her direct line at [phone] or you can call our general clinic number, or email [name] through My Chart.

## 2023-10-18 ENCOUNTER — Encounter: Payer: Self-pay | Admitting: Neurology

## 2023-11-04 ENCOUNTER — Ambulatory Visit: Payer: BC Managed Care – PPO | Admitting: Family

## 2023-11-05 ENCOUNTER — Encounter (INDEPENDENT_AMBULATORY_CARE_PROVIDER_SITE_OTHER): Payer: Self-pay | Admitting: *Deleted

## 2023-11-05 ENCOUNTER — Other Ambulatory Visit: Payer: Self-pay

## 2023-11-05 VITALS — BP 130/81 | HR 69 | Temp 97.7°F | Wt 283.2 lb

## 2023-11-05 DIAGNOSIS — Z006 Encounter for examination for normal comparison and control in clinical research program: Secondary | ICD-10-CM

## 2023-11-05 NOTE — Research (Signed)
Margaret Porter here for her week 552 visit for A5321, the [facility] Study:  Decay of HIV-1 Reservoirs in Patients on Long-Term Antiretroviral Therapy, an observation study. She was seen in the ED in August for SVT. She was taking metoprolol but stopped it because of "how it made me feel". States that she stopped in in October. She is scheduled to see cardiology at the end of the month. Encouraged her to call cardiology to let them know about the side effects of the metoprolol and that she had stopped it. Denied any chest pain, rapid heart beat or shortness of breath. Heart sounds normal with regular rate/rhythm. Verbalized excellent adherence with her Dovato. She is scheduled to see Marcos Eke, NP in December and will return in March for her next A5321 study visit.

## 2023-11-06 ENCOUNTER — Ambulatory Visit: Payer: BC Managed Care – PPO | Admitting: Neurology

## 2023-11-06 DIAGNOSIS — R0683 Snoring: Secondary | ICD-10-CM

## 2023-11-06 DIAGNOSIS — G4733 Obstructive sleep apnea (adult) (pediatric): Secondary | ICD-10-CM | POA: Diagnosis not present

## 2023-11-06 DIAGNOSIS — G4700 Insomnia, unspecified: Secondary | ICD-10-CM

## 2023-11-06 DIAGNOSIS — Z9189 Other specified personal risk factors, not elsewhere classified: Secondary | ICD-10-CM

## 2023-11-06 DIAGNOSIS — G4734 Idiopathic sleep related nonobstructive alveolar hypoventilation: Secondary | ICD-10-CM

## 2023-11-06 DIAGNOSIS — I4892 Unspecified atrial flutter: Secondary | ICD-10-CM

## 2023-11-06 DIAGNOSIS — Z6841 Body Mass Index (BMI) 40.0 and over, adult: Secondary | ICD-10-CM

## 2023-11-06 LAB — CREATININE, SERUM: Creat: 1 mg/dL (ref 0.50–1.03)

## 2023-11-07 ENCOUNTER — Telehealth: Payer: Self-pay

## 2023-11-07 ENCOUNTER — Encounter: Payer: Self-pay | Admitting: Neurology

## 2023-11-07 NOTE — Procedures (Signed)
[facility]  HOME SLEEP TEST (Watch PAT) REPORT  STUDY DATE: 11/07/23  DOB: Dec 29, 1970  MRN: [id number]  ORDERING CLINICIAN: Huston Foley, MD, PhD   REFERRING CLINICIAN: Lazaro Arms, PA  CLINICAL INFORMATION/HISTORY: 53 year old female with an underlying medical history of vitamin D deficiency, allergic rhinitis, prediabetes, sickle cell trait, lower extremity swelling, paroxysmal atrial flutter, insulin resistance, HIV disease, and morbid obesity with a BMI of over 45, who reports snoring and sleep disruption, nonrestorative sleep and daytime tiredness.   Epworth sleepiness score: 7/24.  BMI: 50.7 kg/m  FINDINGS:   Sleep Summary:   Total Recording Time (hours, min): 8 hours, 23 min  Total Sleep Time (hours, min):  7 hours, 9 min  Percent REM (%):    18.9%   Respiratory Indices:   Calculated pAHI (per hour):  62.8/hour         REM pAHI:    32.1/hour       NREM pAHI: 69.8/hour  Central pAHI: 2.1/hour  Oxygen Saturation Statistics:    Oxygen Saturation (%) Mean: 92%   Minimum oxygen saturation (%):                 72%   O2 Saturation Range (%): 72 - 98%    O2 Saturation (minutes) <=88%: 30.7 min  Pulse Rate Statistics:   Pulse Mean (bpm):    78/min    Pulse Range (58 - 108/min)   IMPRESSION:   OSA (obstructive sleep apnea), severe Nocturnal Hypoxemia  RECOMMENDATION:  This home sleep test demonstrates severe obstructive sleep apnea with a total AHI of 62.8/hour and O2 nadir of 72% with significant time below or at 88% saturation of over 30 minutes, indicating nocturnal hypoxemia.  Snoring was detected, in the moderate to loud range for the most part, at times milder.  Treatment with positive airway pressure is highly recommended. The patient will be advised to proceed with an autoPAP titration/trial at home. A laboratory attended titration study can be considered in the future for optimization of treatment settings and to improve  tolerance and compliance. Alternative treatment options are limited secondary to the severity of the patient's sleep disordered breathing, but may include surgical treatment with an implantable hypoglossal nerve stimulator (in carefully selected candidates, meeting criteria).  Concomitant weight loss is recommended (where clinically appropriate). Please note, that untreated obstructive sleep apnea may carry additional perioperative morbidity. Patients with significant obstructive sleep apnea should receive perioperative PAP therapy and the surgeons and particularly the anesthesiologist should be informed of the diagnosis and the severity of the sleep disordered breathing. The patient should be cautioned not to drive, work at heights, or operate dangerous or heavy equipment when tired or sleepy. Review and reiteration of good sleep hygiene measures should be pursued with any patient. Other causes of the patient's symptoms, including circadian rhythm disturbances, an underlying mood disorder, medication effect and/or an underlying medical problem cannot be ruled out based on this test. Clinical correlation is recommended.  The patient and her referring provider will be notified of the test results. The patient will be seen in follow up in sleep clinic at [facility].  I certify that I have reviewed the raw data recording prior to the issuance of this report in accordance with the standards of the American Academy of Sleep Medicine (AASM).  INTERPRETING PHYSICIAN:   Huston Foley, MD, PhD Medical Director, [facility]) Diplomat, ABPN (Neurology and Sleep)   [facility] [address], [address]  [place], [place] [address] [phone]

## 2023-11-07 NOTE — Progress Notes (Signed)
See procedure note.

## 2023-11-07 NOTE — Addendum Note (Signed)
Addended by: Huston Foley on: 11/07/2023 12:26 PM   Modules accepted: Orders

## 2023-11-07 NOTE — Telephone Encounter (Signed)
I called pt. I advised pt that Dr. Frances Furbish reviewed their sleep study results and found that pt has sever OSA. Dr. Frances Furbish recommends that pt starts autopap. I reviewed PAP compliance expectations with the pt. Pt is agreeable to starting a CPAP. I advised pt that an order will be sent to a DME, [facility], and [facility] will call the pt within about one week after they file with the pt's insurance. [facility] will show the pt how to use the machine, fit for masks, and troubleshoot the CPAP if needed.  Pt verbalized understanding of results. Pt had no questions at this time but was encouraged to call back if questions arise. I have sent the order to [facility] and have received confirmation that they have received the order.

## 2023-11-07 NOTE — Telephone Encounter (Addendum)
URGENT cpap Received: Today Bobbye Morton, CMA  Zott, Stacy; Gamble, Tammy New orders have been placed for the above pt, DOB: 06/03/1970 Thanks  Zott, Stacy  Margaret Porter, Abbe Amsterdam, CMA; Melvern Sample Got it Thank You

## 2023-11-07 NOTE — Telephone Encounter (Signed)
-----   Message from Huston Foley sent at 11/07/2023 12:26 PM EST ----- Urgent set up requested on PAP therapy, due to severe OSA. Patient referred by Lazaro Arms, PA, seen by me on 09/16/23, patient had a HST on 11/07/23.    Please call and notify the patient that the recent home sleep test showed obstructive sleep apnea in the severe range. I recommend treatment for this in the form of autoPAP, which means, that we don't have to bring her in for a sleep study with CPAP, but will let her start using a so called autoPAP machine at home, through a DME company (of her choice, or as per insurance requirement). The DME representative will fit the patient with a mask of choice, educate her on how to use the machine, how to put the mask on, etc. I have placed an order in the chart. Please send the order to a local DME, talk to patient, send report to referring MD. Please also reinforce the need for compliance with treatment. We will need a FU in sleep clinic for 10 weeks post-PAP set up, please arrange that with me or one of our NPs. Thanks,   Huston Foley, MD, PhD [facility])

## 2023-11-11 NOTE — Telephone Encounter (Signed)
Actually it looks like pt was already notified of sleep study results and order was sent to DME. Message sent to pt via mychart.

## 2023-11-20 ENCOUNTER — Other Ambulatory Visit: Payer: Self-pay | Admitting: Obstetrics and Gynecology

## 2023-11-27 ENCOUNTER — Encounter (HOSPITAL_COMMUNITY): Payer: Self-pay | Admitting: Internal Medicine

## 2023-11-27 ENCOUNTER — Ambulatory Visit (HOSPITAL_COMMUNITY)
Admission: RE | Admit: 2023-11-27 | Discharge: 2023-11-27 | Disposition: A | Discharge status: Home / Self Care | Payer: BC Managed Care – PPO | Source: Ambulatory Visit | Attending: Internal Medicine | Admitting: Internal Medicine

## 2023-11-27 VITALS — BP 118/84 | HR 61 | Ht 63.0 in | Wt 284.4 lb

## 2023-11-27 DIAGNOSIS — Z21 Asymptomatic human immunodeficiency virus [HIV] infection status: Secondary | ICD-10-CM | POA: Diagnosis not present

## 2023-11-27 DIAGNOSIS — Z7901 Long term (current) use of anticoagulants: Secondary | ICD-10-CM | POA: Diagnosis not present

## 2023-11-27 DIAGNOSIS — R7303 Prediabetes: Secondary | ICD-10-CM | POA: Diagnosis not present

## 2023-11-27 DIAGNOSIS — I483 Typical atrial flutter: Secondary | ICD-10-CM | POA: Diagnosis present

## 2023-11-27 DIAGNOSIS — E785 Hyperlipidemia, unspecified: Secondary | ICD-10-CM | POA: Insufficient documentation

## 2023-11-27 NOTE — Progress Notes (Signed)
Primary Care Physician: Jac Canavan, PA-C Primary Cardiologist: None Electrophysiologist: None     Referring Physician: ED     Margaret Porter is a 53 y.o. female with a history of HIV, HLD, prediabetes, and paroxysmal atrial flutter who presents for consultation in the [facility]. Recent ED visit on 08/19/23 for palpitations with concern to be in SVT; received 2 rounds of adenosine with reported period of asystole and syncope (no ACLS performed). Discharged on Toprol 25 mg daily. Patient is on Eliquis 5 mg BID for a CHADS2VASC score of 1.  On evaluation today, she is currently in NSR. She was having a smashburger at [employer] when she had onset of feeling anxious and heart racing. She snores and is scheduled for a sleep consult. She does not drink caffeine but does admit to drinking some sodas. She did not begin Eliquis. She stopped taking Toprol because it was making her feel tired.   On follow up 11/27/23, she is currently in NSR. She has had no episodes of atrial flutter since last office visit. She is not on anticoagulation due to Chads score of 1. She is taking Toprol 12.5 mg at bedtime. She notes intermittent left lower leg swelling after standing for extended periods of time.   Today, she denies symptoms of palpitations, chest pain, shortness of breath, orthopnea, PND, lower extremity edema, dizziness, presyncope, syncope, snoring, daytime somnolence, bleeding, or neurologic sequela. The patient is tolerating medications without difficulties and is otherwise without complaint today.   EMS ECG:     Atrial Fibrillation Risk Factors:  she does have symptoms or diagnosis of sleep apnea. she is scheduled for a sleep consult.   she has a BMI of Body mass index is 50.38 kg/m.[name] Kitchen Filed Weights   11/27/23 1036  Weight: 129 kg     Current Outpatient Medications  Medication Sig Dispense Refill   diphenhydrAMINE (BENADRYL) 25 MG tablet Take 25 mg  by mouth every 6 (six) hours as needed for itching or allergies. Reported on 02/29/2016     metoprolol succinate (TOPROL-XL) 25 MG 24 hr tablet Take 0.5 tablets (12.5 mg total) by mouth at bedtime. 15 tablet 3   Pitavastatin Calcium 4 MG TABS Take 1 tablet (4 mg total) by mouth daily. 30 tablet 11   Study - EYEWITNESS [id number]) - [id number] (dolutegravir/lamivudine) 50-300 mg tablet (PI-[place]) Take 1 tablet by mouth daily. 180 tablet 0   topiramate (TOPAMAX) 25 MG tablet Take 1 tablet (25 mg total) by mouth at bedtime. 25 mg at bedtime nightly x 1 week and if tolerating well, increase to 50 mg at bedtime. 45 tablet 0   Vitamin D, Ergocalciferol, (DRISDOL) 1.25 MG (50000 UNIT) CAPS capsule Take 1 capsule (50,000 Units total) by mouth every 7 (seven) days. 4 capsule 0   No current facility-administered medications for this encounter.    Atrial Fibrillation Management history:  Previous antiarrhythmic drugs: None Previous cardioversions: None Previous ablations: None Anticoagulation history: Eliquis (stopped due to low Chads score)   ROS- All systems are reviewed and negative except as per the HPI above.  Physical Exam: BP 118/84   Pulse 61   Ht 5\' 3"  (1.6 m)   Wt 129 kg   LMP  (LMP Unknown)   BMI 50.38 kg/m   GEN- The patient is well appearing, alert and oriented x 3 today.   Neck - no JVD or carotid bruit noted Lungs- Clear to ausculation bilaterally, normal work of breathing  Heart- Regular rate and rhythm, no murmurs, rubs or gallops, PMI not laterally displaced Extremities- no clubbing, cyanosis, or edema Skin - no rash or ecchymosis noted   EKG today demonstrates  Vent. rate 61 BPM PR interval 138 ms QRS duration 80 ms QT/QTcB 394/396 ms P-R-T axes 53 37 -7 Normal sinus rhythm Cannot rule out Anterior infarct , age undetermined T wave abnormality, consider inferior ischemia Abnormal ECG When compared with ECG of 27-Aug-2023 11:04, PREVIOUS ECG IS PRESENT  Echo  09/05/23:  1. Left ventricular ejection fraction, by estimation, is 60 to 65%. Left  ventricular ejection fraction by 2D MOD biplane is 61.4 %. The left  ventricle has normal function. The left ventricle has no regional wall  motion abnormalities. Left ventricular  diastolic parameters were normal. The average left ventricular global  longitudinal strain is -20.9 %. The global longitudinal strain is normal.   2. Right ventricular systolic function is normal. The right ventricular  size is normal. There is normal pulmonary artery systolic pressure. The  estimated right ventricular systolic pressure is 22.2 mmHg.   3. The mitral valve is grossly normal. Trivial mitral valve  regurgitation. No evidence of mitral stenosis.   4. The aortic valve is tricuspid. Aortic valve regurgitation is not  visualized. No aortic stenosis is present.   5. The inferior vena cava is normal in size with greater than 50%  respiratory variability, suggesting right atrial pressure of 3 mmHg.    ASSESSMENT & PLAN CHA2DS2-VASc Score = 1  The patient's score is based upon: CHF History: 0 HTN History: 0 Diabetes History: 0 Stroke History: 0 Vascular Disease History: 0 Age Score: 0 Gender Score: 1      ASSESSMENT AND PLAN: Paroxysmal Atrial Flutter The patient's CHA2DS2-VASc score is 1, indicating a 0.6% annual risk of stroke.    She is currently in NSR. She does not meet criteria for anticoagulation and so okay to not take at this time. After discussion, she will try stopping Toprol 12.5 mg at bedtime to see if tolerates okay. She would like to follow up as needed and I am okay with this.    Follow up Afib clinic prn.    Lake Bells, PA-C  Afib Clinic [facility] [address] [place], [place] [address] [phone]

## 2023-12-02 ENCOUNTER — Other Ambulatory Visit: Payer: Self-pay

## 2023-12-02 DIAGNOSIS — Z79899 Other long term (current) drug therapy: Secondary | ICD-10-CM

## 2023-12-02 MED ORDER — PITAVASTATIN CALCIUM 4 MG PO TABS: 4.0000 mg | ORAL_TABLET | Freq: Every day | ORAL | 5 refills | Status: DC

## 2023-12-06 ENCOUNTER — Other Ambulatory Visit: Payer: Self-pay

## 2023-12-06 DIAGNOSIS — Z006 Encounter for examination for normal comparison and control in clinical research program: Secondary | ICD-10-CM

## 2023-12-06 MED ORDER — STUDY - EYEWITNESS (GSK219516) - GSK3515864 (DOLUTEGRAVIR/LAMIVUDINE) 50-300 MG TABLET (PI-VAN DAM)
1.0000 | ORAL_TABLET | Freq: Every day | ORAL | 0 refills | Status: DC
Start: 2023-12-06 — End: 2024-05-20

## 2023-12-06 NOTE — Research (Signed)
Individual seen for research visit Week 48 for [employer], 219516, A Phase 3b, multicenter, single-arm, open-label study evaluating the efficacy, safety, and tolerability of switching to DTG/3TC single tablet regimen administered once daily from a bictegravir/emtricitabine/tenofovir alafenamide single tablet regimen in people living with HIV of atleast 55 years of age who are virologically suppressed.Overall individual is doing well. All procedures carried out per protocol. Study medications dispensed. Plan to see participant again in May 2025

## 2023-12-10 ENCOUNTER — Other Ambulatory Visit: Payer: Self-pay

## 2023-12-10 ENCOUNTER — Encounter (HOSPITAL_BASED_OUTPATIENT_CLINIC_OR_DEPARTMENT_OTHER): Payer: Self-pay | Admitting: Obstetrics and Gynecology

## 2023-12-10 NOTE — Progress Notes (Addendum)
Spoke w/ via phone for pre-op interview---Margaret Porter needs dos---- cbc, cmp      Porter results------11/27/23 EKG in chart & Epic, 08/19/23 chest xrayin Epic, 09/05/23 echo in Epic COVID test -----patient states asymptomatic no test needed Arrive at -------1115 on Friday, 12/13/23 NPO after MN NO Solid Food.  Clear liquids from MN until---1015 Med rec completed Medications to take morning of surgery -----Pitavastatin, Dolutegravir / lamivudine Diabetic medication -----n/a Patient instructed no nail polish to be worn day of surgery Patient instructed to bring photo id and insurance card day of surgery Patient aware to have Driver (ride ) / caregiver    for 24 hours after surgery - friend, [name] Patient Special Instructions -----Bring CPAP and supplies on day of surgery. Pre-Op special Instructions -----none Patient verbalized understanding of instructions that were given at this phone interview. Patient denies chest pain, sob, fever, cough at the interview.   Patient has a history of HIV, HLD, prediabetes, atrial flutter, CKD 3, hepatic steatosis and fibrosis. I reviewed this case with Dr. Richardson Landry, MDA. Per Dr. Richardson Landry, okay to proceed with surgery at [facility].  Patient has a BMI of 50.49. She is coming in on 12/11/23 at 1pm for an anesthesia evaluation. I let Dr. Richardson Landry, MDA know that she would be coming in tomorrow at 1 pm. I also spoke with Tish in PACU and let her know to expect the patient at 1 pm tomorrow.

## 2023-12-11 NOTE — Progress Notes (Addendum)
Anesthesia preop eval A-flutter 08/2023. Converted in ED. Seen in afib clinic 11/2023. No issues since. BMI ~51.5. Airway reassuring. Okay to proceed with Margaret Porter [facility].

## 2023-12-13 ENCOUNTER — Ambulatory Visit (HOSPITAL_BASED_OUTPATIENT_CLINIC_OR_DEPARTMENT_OTHER): Payer: BC Managed Care – PPO | Admitting: Anesthesiology

## 2023-12-13 ENCOUNTER — Encounter (HOSPITAL_BASED_OUTPATIENT_CLINIC_OR_DEPARTMENT_OTHER)
Admission: RE | Disposition: A | Discharge status: Home / Self Care | Payer: Self-pay | Source: Home / Self Care | Attending: Obstetrics and Gynecology

## 2023-12-13 ENCOUNTER — Encounter (HOSPITAL_BASED_OUTPATIENT_CLINIC_OR_DEPARTMENT_OTHER): Payer: Self-pay | Admitting: Obstetrics and Gynecology

## 2023-12-13 ENCOUNTER — Ambulatory Visit (HOSPITAL_BASED_OUTPATIENT_CLINIC_OR_DEPARTMENT_OTHER)
Admission: RE | Admit: 2023-12-13 | Discharge: 2023-12-13 | Disposition: A | Discharge status: Home / Self Care | Payer: BC Managed Care – PPO | Attending: Obstetrics and Gynecology | Admitting: Obstetrics and Gynecology

## 2023-12-13 ENCOUNTER — Other Ambulatory Visit: Payer: Self-pay

## 2023-12-13 DIAGNOSIS — G4730 Sleep apnea, unspecified: Secondary | ICD-10-CM | POA: Diagnosis not present

## 2023-12-13 DIAGNOSIS — D250 Submucous leiomyoma of uterus: Secondary | ICD-10-CM | POA: Diagnosis not present

## 2023-12-13 DIAGNOSIS — Z01818 Encounter for other preprocedural examination: Secondary | ICD-10-CM

## 2023-12-13 DIAGNOSIS — R7303 Prediabetes: Secondary | ICD-10-CM | POA: Diagnosis not present

## 2023-12-13 DIAGNOSIS — Z6841 Body Mass Index (BMI) 40.0 and over, adult: Secondary | ICD-10-CM | POA: Insufficient documentation

## 2023-12-13 DIAGNOSIS — I4891 Unspecified atrial fibrillation: Secondary | ICD-10-CM | POA: Diagnosis not present

## 2023-12-13 DIAGNOSIS — Z21 Asymptomatic human immunodeficiency virus [HIV] infection status: Secondary | ICD-10-CM | POA: Insufficient documentation

## 2023-12-13 DIAGNOSIS — D573 Sickle-cell trait: Secondary | ICD-10-CM | POA: Diagnosis not present

## 2023-12-13 DIAGNOSIS — K219 Gastro-esophageal reflux disease without esophagitis: Secondary | ICD-10-CM | POA: Diagnosis not present

## 2023-12-13 DIAGNOSIS — R9389 Abnormal findings on diagnostic imaging of other specified body structures: Secondary | ICD-10-CM | POA: Diagnosis not present

## 2023-12-13 DIAGNOSIS — N950 Postmenopausal bleeding: Secondary | ICD-10-CM | POA: Insufficient documentation

## 2023-12-13 HISTORY — DX: Chronic kidney disease, stage 3a: N18.31

## 2023-12-13 HISTORY — DX: Cardiac arrhythmia, unspecified: I49.9

## 2023-12-13 HISTORY — PX: DILATATION & CURETTAGE/HYSTEROSCOPY WITH MYOSURE: SHX6511

## 2023-12-13 HISTORY — DX: Family history of other specified conditions: Z84.89

## 2023-12-13 HISTORY — DX: Sleep apnea, unspecified: G47.30

## 2023-12-13 HISTORY — DX: Sickle-cell trait: D57.3

## 2023-12-13 HISTORY — DX: Presence of spectacles and contact lenses: Z97.3

## 2023-12-13 HISTORY — DX: Fatty (change of) liver, not elsewhere classified: K76.0

## 2023-12-13 HISTORY — DX: Unspecified atrial flutter: I48.92

## 2023-12-13 HISTORY — DX: Mixed hyperlipidemia: E78.2

## 2023-12-13 LAB — COMPREHENSIVE METABOLIC PANEL
ALT: 20 U/L (ref 0–44)
AST: 20 U/L (ref 15–41)
Albumin: 4.2 g/dL (ref 3.5–5.0)
Alkaline Phosphatase: 114 U/L (ref 38–126)
Anion gap: 9 (ref 5–15)
BUN: 13 mg/dL (ref 6–20)
CO2: 22 mmol/L (ref 22–32)
Calcium: 9.2 mg/dL (ref 8.9–10.3)
Chloride: 108 mmol/L (ref 98–111)
Creatinine, Ser: 1.06 mg/dL — ABNORMAL HIGH (ref 0.44–1.00)
GFR, Estimated: >60 mL/min (ref >60)
Glucose, Bld: 110 mg/dL — ABNORMAL HIGH (ref 70–99)
Potassium: 3.8 mmol/L (ref 3.5–5.1)
Sodium: 139 mmol/L (ref 135–145)
Total Bilirubin: 0.7 mg/dL (ref <1.2)
Total Protein: 6.9 g/dL (ref 6.5–8.1)

## 2023-12-13 LAB — CBC
HCT: 44.5 % (ref 36.0–46.0)
Hemoglobin: 14.6 g/dL (ref 12.0–15.0)
MCH: 33.5 pg (ref 26.0–34.0)
MCHC: 32.8 g/dL (ref 30.0–36.0)
MCV: 102.1 fL — ABNORMAL HIGH (ref 80.0–100.0)
Platelets: 226 10*3/uL (ref 150–400)
RBC: 4.36 MIL/uL (ref 3.87–5.11)
RDW: 12.8 % (ref 11.5–15.5)
WBC: 6.3 10*3/uL (ref 4.0–10.5)
nRBC: 0 % (ref 0.0–0.2)

## 2023-12-13 SURGERY — DILATATION & CURETTAGE/HYSTEROSCOPY WITH MYOSURE
Anesthesia: General

## 2023-12-13 MED ORDER — ONDANSETRON HCL 4 MG/2ML IJ SOLN
4.0000 mg | Freq: Once | INTRAMUSCULAR | Status: AC
  Administered 2023-12-13: 4 mg via INTRAVENOUS

## 2023-12-13 MED ORDER — LIDOCAINE HCL (PF) 2 % IJ SOLN
INTRAMUSCULAR | Status: AC
  Filled 2023-12-13: qty 20

## 2023-12-13 MED ORDER — FENTANYL CITRATE (PF) 100 MCG/2ML IJ SOLN
25.0000 ug | INTRAMUSCULAR | Status: DC | PRN
  Administered 2023-12-13 (×2): 50 ug via INTRAVENOUS

## 2023-12-13 MED ORDER — ROCURONIUM BROMIDE 10 MG/ML (PF) SYRINGE
PREFILLED_SYRINGE | INTRAVENOUS | Status: AC
  Filled 2023-12-13: qty 10

## 2023-12-13 MED ORDER — KETOROLAC TROMETHAMINE 30 MG/ML IJ SOLN
INTRAMUSCULAR | Status: AC
  Filled 2023-12-13: qty 1

## 2023-12-13 MED ORDER — LIDOCAINE 2% (20 MG/ML) 5 ML SYRINGE
INTRAMUSCULAR | Status: DC | PRN
  Administered 2023-12-13: 60 mg via INTRAVENOUS

## 2023-12-13 MED ORDER — DEXMEDETOMIDINE HCL IN NACL 80 MCG/20ML IV SOLN
INTRAVENOUS | Status: DC | PRN
  Administered 2023-12-13: 12 ug via INTRAVENOUS

## 2023-12-13 MED ORDER — SODIUM CHLORIDE 0.9 % IV SOLN: INTRAVENOUS | Status: DC

## 2023-12-13 MED ORDER — DEXAMETHASONE SODIUM PHOSPHATE 10 MG/ML IJ SOLN
INTRAMUSCULAR | Status: AC
  Filled 2023-12-13: qty 4

## 2023-12-13 MED ORDER — SODIUM CHLORIDE 0.9 % IV SOLN: 12.5000 mg | INTRAVENOUS | Status: DC | PRN

## 2023-12-13 MED ORDER — PROPOFOL 10 MG/ML IV BOLUS
INTRAVENOUS | Status: DC | PRN
  Administered 2023-12-13: 200 mg via INTRAVENOUS

## 2023-12-13 MED ORDER — MIDAZOLAM HCL 2 MG/2ML IJ SOLN
INTRAMUSCULAR | Status: AC
  Filled 2023-12-13: qty 2

## 2023-12-13 MED ORDER — FENTANYL CITRATE (PF) 250 MCG/5ML IJ SOLN
INTRAMUSCULAR | Status: DC | PRN
  Administered 2023-12-13: 50 ug via INTRAVENOUS
  Administered 2023-12-13 (×2): 25 ug via INTRAVENOUS

## 2023-12-13 MED ORDER — FENTANYL CITRATE (PF) 100 MCG/2ML IJ SOLN
INTRAMUSCULAR | Status: AC
  Filled 2023-12-13: qty 2

## 2023-12-13 MED ORDER — SODIUM CHLORIDE 0.9 % IR SOLN
Status: DC | PRN
  Administered 2023-12-13: 3000 mL

## 2023-12-13 MED ORDER — ACETAMINOPHEN 500 MG PO TABS
1000.0000 mg | ORAL_TABLET | Freq: Once | ORAL | Status: AC
  Administered 2023-12-13: 1000 mg via ORAL

## 2023-12-13 MED ORDER — TRAMADOL HCL 50 MG PO TABS: 50.0000 mg | ORAL_TABLET | Freq: Four times a day (QID) | ORAL | 0 refills | Status: AC | PRN

## 2023-12-13 MED ORDER — PROPOFOL 10 MG/ML IV BOLUS
INTRAVENOUS | Status: AC
  Filled 2023-12-13: qty 20

## 2023-12-13 MED ORDER — DEXAMETHASONE SODIUM PHOSPHATE 10 MG/ML IJ SOLN
INTRAMUSCULAR | Status: DC | PRN
  Administered 2023-12-13: 10 mg via INTRAVENOUS

## 2023-12-13 MED ORDER — MIDAZOLAM HCL 2 MG/2ML IJ SOLN
INTRAMUSCULAR | Status: DC | PRN
  Administered 2023-12-13: 2 mg via INTRAVENOUS

## 2023-12-13 MED ORDER — ACETAMINOPHEN 500 MG PO TABS
ORAL_TABLET | ORAL | Status: AC
  Filled 2023-12-13: qty 2

## 2023-12-13 MED ORDER — AMISULPRIDE (ANTIEMETIC) 5 MG/2ML IV SOLN: 10.0000 mg | Freq: Once | INTRAVENOUS | Status: DC | PRN

## 2023-12-13 MED ORDER — ONDANSETRON HCL 4 MG/2ML IJ SOLN
INTRAMUSCULAR | Status: DC | PRN
  Administered 2023-12-13: 4 mg via INTRAVENOUS

## 2023-12-13 MED ORDER — TRAMADOL HCL 50 MG PO TABS: 100.0000 mg | ORAL_TABLET | Freq: Once | ORAL | Status: DC

## 2023-12-13 MED ORDER — SUCCINYLCHOLINE CHLORIDE 200 MG/10ML IV SOSY
PREFILLED_SYRINGE | INTRAVENOUS | Status: DC | PRN
  Administered 2023-12-13: 140 mg via INTRAVENOUS

## 2023-12-13 MED ORDER — ONDANSETRON HCL 4 MG/2ML IJ SOLN
INTRAMUSCULAR | Status: AC
Start: 2023-12-13 — End: ?
  Filled 2023-12-13: qty 2

## 2023-12-13 MED ORDER — ONDANSETRON HCL 4 MG/2ML IJ SOLN
INTRAMUSCULAR | Status: AC
Start: 2023-12-13 — End: ?
  Filled 2023-12-13: qty 8

## 2023-12-13 MED ORDER — POVIDONE-IODINE 10 % EX SWAB
2.0000 | Freq: Once | CUTANEOUS | Status: DC
Start: 2023-12-13 — End: 2023-12-13

## 2023-12-13 SURGICAL SUPPLY — 22 items
CATH ROBINSON RED A/P 16FR (CATHETERS) IMPLANT
DEVICE MYOSURE LITE (MISCELLANEOUS) IMPLANT
DEVICE MYOSURE REACH (MISCELLANEOUS) IMPLANT
DILATOR CANAL MILEX (MISCELLANEOUS) IMPLANT
DRSG TELFA 3X8 NADH STRL (GAUZE/BANDAGES/DRESSINGS) ×1 IMPLANT
GAUZE 4X4 16PLY ~~LOC~~+RFID DBL (SPONGE) ×1 IMPLANT
GLOVE BIOGEL PI IND STRL 7.0 (GLOVE) ×1 IMPLANT
GLOVE ECLIPSE 6.5 STRL STRAW (GLOVE) ×1 IMPLANT
GOWN STRL REUS W/TWL LRG LVL3 (GOWN DISPOSABLE) ×1 IMPLANT
IV NS IRRIG 3000ML ARTHROMATIC (IV SOLUTION) ×1 IMPLANT
KIT PROCEDURE FLUENT (KITS) ×1 IMPLANT
KIT TURNOVER CYSTO (KITS) ×1 IMPLANT
MYOSURE XL FIBROID (MISCELLANEOUS)
PACK VAGINAL MINOR WOMEN LF (CUSTOM PROCEDURE TRAY) ×1 IMPLANT
PAD OB MATERNITY 4.3X12.25 (PERSONAL CARE ITEMS) ×1 IMPLANT
PAD PREP 24X48 CUFFED NSTRL (MISCELLANEOUS) ×1 IMPLANT
SEAL CERVICAL OMNI LOK (ABLATOR) IMPLANT
SEAL ROD LENS SCOPE MYOSURE (ABLATOR) ×1 IMPLANT
SLEEVE SCD COMPRESS KNEE MED (STOCKING) ×1 IMPLANT
SYSTEM TISS REMOVAL MYOSURE XL (MISCELLANEOUS) IMPLANT
TOWEL OR 17X24 6PK STRL BLUE (TOWEL DISPOSABLE) ×1 IMPLANT
WATER STERILE IRR 500ML POUR (IV SOLUTION) ×1 IMPLANT

## 2023-12-13 NOTE — Anesthesia Preprocedure Evaluation (Addendum)
Anesthesia Evaluation  Patient identified by MRN, date of birth, ID band Patient awake    Reviewed: Allergy & Precautions, NPO status , Patient's Chart, lab work & pertinent test results  History of Anesthesia Complications Negative for: history of anesthetic complications  Airway Mallampati: II  TM Distance: >3 FB Neck ROM: Full    Dental  (+) Dental Advisory Given   Pulmonary sleep apnea and Continuous Positive Airway Pressure Ventilation    Pulmonary exam normal        Cardiovascular Normal cardiovascular exam+ dysrhythmias Atrial Fibrillation and Supra Ventricular Tachycardia      Neuro/Psych negative neurological ROS  negative psych ROS   GI/Hepatic Neg liver ROS,GERD  Controlled and Medicated,,  Endo/Other    Class 4 obesity Pre-DM   Renal/GU CRFRenal disease  Female GU complaint     Musculoskeletal negative musculoskeletal ROS (+)    Abdominal  (+) + obese  Peds  Hematology  (+) Blood dyscrasia, Sickle cell trait , HIV  Anesthesia Other Findings   Reproductive/Obstetrics                             Anesthesia Physical Anesthesia Plan  ASA: 3  Anesthesia Plan: General   Post-op Pain Management: Tylenol PO (pre-op)*   Induction: Intravenous and Rapid sequence  PONV Risk Score and Plan: 3 and Treatment may vary due to age or medical condition, Ondansetron, Dexamethasone and Midazolam  Airway Management Planned: Oral ETT  Additional Equipment: None  Intra-op Plan:   Post-operative Plan: Extubation in OR  Informed Consent: I have reviewed the patients History and Physical, chart, labs and discussed the procedure including the risks, benefits and alternatives for the proposed anesthesia with the patient or authorized representative who has indicated his/her understanding and acceptance.     Dental advisory given  Plan Discussed with: CRNA and  Anesthesiologist  Anesthesia Plan Comments: (GETA with RSI given active nausea)       Anesthesia Quick Evaluation

## 2023-12-13 NOTE — Op Note (Unsigned)
NAME: Margaret Porter, Margaret Porter MEDICAL RECORD NO: [id number] ACCOUNT NO: [id number] DATE OF BIRTH: [birth year] FACILITY: [facility] LOCATION: [facility] PHYSICIAN: Mayda Shippee A. Cherly Hensen, MD  Operative Report   DATE OF PROCEDURE: 12/13/2023  PREOPERATIVE DIAGNOSIS:  Postmenopausal bleeding, endometrial mass.  PROCEDURE:  Diagnostic hysteroscopy, hysteroscopic resection of endometrial polyp and submucosal fibroid using MyoSure, dilation and curettage.  POSTOPERATIVE DIAGNOSIS:  Postmenopausal bleeding, endometrial polyps, submucosal fibroid.  ANESTHESIA:  General.  SURGEON:  Orton Capell A. Cherly Hensen, MD  ASSISTANT:  None.  DESCRIPTION OF PROCEDURE:  Under adequate general anesthesia, the patient was placed in the dorsal lithotomy position.  She was sterilely prepped and draped in the usual fashion.  The patient had voided prior to entering the room.  Examination under  anesthesia revealed anteflexed uterus.  No adnexal masses could be appreciated, but limited by the patient's body habitus.  Bivalve speculum was placed in the vagina, single-tooth tenaculum was placed on the anterior lip of the cervix.  The cervix was  anteriorly dilated up to #19 [facility] dilator.  Diagnostic hysteroscope was introduced into the uterine cavity.  A heart-shaped fundal uterine cavity was noted with a right posterior lateral endometrial polyp seen and an anterior slightly lateral submucosal fibroid was noted.  Using the REACH resectoscope, the endometrial polyp, submucosal fibroid was resected and the endometrial cavity was also resected.  When all tissue was felt to have been resected completely, the endocervical canal was inspected and no lesions were noted, at which time all instruments were then removed from the vagina.  SPECIMEN:  Labeled endometrial curetting with a polyp and fibroid resection was sent to Pathology.  ESTIMATED BLOOD LOSS:  About 3 mL.  FLUID DEFICIT:  350 mL.  INTRAOPERATIVE FLUIDS:  About 400  mL.  COUNTS:  Sponge and instruments counts x2 is correct.  COMPLICATIONS:  None.  DISPOSITION:  The patient tolerated the procedure well.  She was transferred to the recovery room in stable condition.   PUS D: 12/13/2023 4:06:51 pm T: 12/13/2023 6:48:00 pm  JOB: [id number]/ [id number]

## 2023-12-13 NOTE — H&P (Signed)
Margaret Porter is an 53 y.o. female. G0 SF presents for dx hysteroscopy, D&C, hysteroscopic resection of endometrial mass using myosure. PMB w/u revealed on sonohysterogram , endometrial stripe 4.67 mm with 14 mm endometrial posterior mass  Pertinent Gynecological History: Menses: post-menopausal Bleeding: post menopausal bleeding Contraception: none DES exposure: denies Blood transfusions: none Sexually transmitted diseases: no past history Previous GYN Procedures:  n/a   Last mammogram: normal Date: 2024 Last pap: abnormal: ascus (-) HPV  Date: 10/2023 OB History: G0, P0   Menstrual History: Menarche age: n/a No LMP recorded (lmp unknown). Patient is postmenopausal.    Past Medical History:  Diagnosis Date   Allergic rhinitis    Allergy    Atrial flutter (HCC)    Follows w/ Margaret Porter at [facility]. Patient was taking Metoprolol daily. Per patient, she was told by the A-fib Porter that she no longer needed to take daily if she was not having any palpitations.   CKD stage 3a, GFR 45-59 ml/min (HCC)    Follows w/ PCP @ [facility].   Dysrhythmia    Family history of adverse reaction to anesthesia    Patient states that her mother has difficulty waking up from anesthesia.   Food allergy    Hepatic steatosis    Follows w/ [facility]. Patient also w/ hepatic fibrosis, see 12/08/23 Fibroscan Liver Exam in Care Everywhere.   HIV infection (HCC) 2006   Follows w/ [facility].   Idiopathic urticaria    Joint pain    Lytic bone lesions on xray 2020   bone lesions on hip,sternum and spine, not related to a malignancy per pt   Mixed hyperlipidemia    Follows w/ PCP @ [facility].   Obesity    Pre-diabetes    pt not on meds   Rosacea    Sickle cell trait (HCC)    Sleep apnea    11/06/23 Home sleep study showed severe obstructive sleep apnea.   SVT (supraventricular tachycardia) (HCC)  08/19/2023   Patient was having palpitations. EMS had concern for SVT. Two rounds of adenosine given. Patient had episode of asytole and syncope. Patient responded spontaneously. No CPR or ALS needed.   Swelling of both lower extremities    per pt, left side is more swollen   Vitamin D deficiency    Wears glasses     Past Surgical History:  Procedure Laterality Date   COLONOSCOPY  03/09/2019   diverticulosis sigmoid, int hemorrhoids, Margaret Porter   DILATATION & CURRETTAGE/HYSTEROSCOPY WITH RESECTOCOPE N/A 06/27/2015   Procedure: DILATATION & CURETTAGE/HYSTEROSCOPY WITH RESECTOCOPE;  Surgeon: Margaret Better, MD;  Location: [facility];  Service: Gynecology;  Laterality: N/A;   DOPPLER ECHOCARDIOGRAPHY  09/05/2023   EF 60 - 65%   LAPAROSCOPIC APPENDECTOMY N/A 09/07/2017   Procedure: APPENDECTOMY LAPAROSCOPIC;  Surgeon: Berna Bue, MD;  Location: [facility] OR;  Service: General;  Laterality: N/A;    Family History  Problem Relation Age of Onset   Heart disease Mother        cardiomyopathy related to covid infection   Diabetes Mother    Heart disease Father        CAD, considering pacemaker 2023   Diabetes Father    High blood pressure Father    Arrhythmia Father    Diabetes Brother    Heart disease Paternal Uncle    Heart disease Maternal Grandmother    Cancer Maternal Grandmother  breast   Heart disease Paternal Grandmother    Cancer Paternal Grandmother        breast   Colon cancer Neg Hx    Colon polyps Neg Hx    Esophageal cancer Neg Hx    Rectal cancer Neg Hx    Stomach cancer Neg Hx    Sleep apnea Neg Hx     Social History:  reports that she has never smoked. She has never used smokeless tobacco. She reports current alcohol use. She reports that she does not use drugs.  Allergies:  Allergies  Allergen Reactions   Molds & Smuts     SOB, chest congestion   Oxycodone     Questionable mouth itching, sweats, but was also simultaneously on Cipro,  Flagyl, and Robaxin.   Wellbutrin [Bupropion] Other (See Comments)    Eyes swelled 3 hours after taking one dose   Red Dye #40 (Allura Red)     Caused a rash with large amounts   Amoxicillin Rash   Aspirin Anxiety   Sulfamethoxazole-Trimethoprim Rash    No medications prior to admission.    Review of Systems  All other systems reviewed and are negative.   Blood pressure (!) 150/78, pulse 78, temperature 97.6 F (36.4 C), resp. rate 18, height 5\' 3"  (1.6 m), weight 131.9 kg, SpO2 98%. Physical Exam Constitutional:      Appearance: Normal appearance. She is obese.  HENT:     Head: Atraumatic.  Eyes:     Extraocular Movements: Extraocular movements intact.  Cardiovascular:     Rate and Rhythm: Regular rhythm.  Pulmonary:     Breath sounds: Normal breath sounds.  Abdominal:     Comments: Obese soft non distended  Genitourinary:    General: Normal vulva.     Comments: Vagina . Atrophic pale mucosa Cervix nulliparous Uterus AV enlarged irregular 10 wk Adnexa non palpable Musculoskeletal:     Cervical back: Neck supple.  Skin:    General: Skin is warm and dry.  Neurological:     General: No focal deficit present.     Mental Status: She is alert and oriented to person, place, and time.  Psychiatric:        Mood and Affect: Mood normal.        Behavior: Behavior normal.     No results found for this or any previous visit (from the past 24 hours).  No results found.  Assessment/Plan: PMB Endometrial mass  Fibroid uterus Morbid obesity P) dx hysteroscopy, D&C, hysteroscopic resection of endometrial mass using myosure. Procedure explained. Risk of surgery reviewed including infection, bleeding, injury to surrounding organ structures, thermal injury, fluid overload and its mgmt. All ? answered  [name] A [name] 12/13/2023, 5:26 AM

## 2023-12-13 NOTE — Discharge Instructions (Addendum)
  Post Anesthesia Home Care Instructions  Activity: Get plenty of rest for the remainder of the day. A responsible individual must stay with you for 24 hours following the procedure.  For the next 24 hours, DO NOT: -Drive a car -Advertising copywriter -Drink alcoholic beverages -Take any medication unless instructed by your physician -Make any legal decisions or sign important papers.  Meals: Start with liquid foods such as gelatin or soup. Progress to regular foods as tolerated. Avoid greasy, spicy, heavy foods. If nausea and/or vomiting occur, drink only clear liquids until the nausea and/or vomiting subsides. Call your physician if vomiting continues.  Special Instructions/Symptoms: Your throat may feel dry or sore from the anesthesia or the breathing tube placed in your throat during surgery. If this causes discomfort, gargle with warm salt water. The discomfort should disappear within 24 hours.   No Tylenol until after 5:45 pm .

## 2023-12-13 NOTE — Brief Op Note (Signed)
12/13/2023  2:35 PM  PATIENT:  Margaret Porter  53 y.o. female  PRE-OPERATIVE DIAGNOSIS:  Postmenopausal bleeding, endometrial thickening  POST-OPERATIVE DIAGNOSIS:  Postmenopausal bleeding, endometrial thickening  PROCEDURE:  Procedure(s): DILATATION & CURETTAGE/HYSTEROSCOPY WITH MYOSURE (N/A)  SURGEON:  Surgeons and Role:    * Maxie Better, MD - Primary  PHYSICIAN ASSISTANT:   ASSISTANTS: {ASSISTANTS:31801}   ANESTHESIA:   {procedures; anesthesia:812}  EBL:  2 mL   BLOOD ADMINISTERED:{BLOOD GIVEN TYPES AND AMOUNTS:20467}  DRAINS: {Devices; drains:31758}   LOCAL MEDICATIONS USED:  {LOCAL MEDICATIONS:10721995}  SPECIMEN:  {ONC STAGING; AJCC TYPE OF SPECIMEN:115600001}  DISPOSITION OF SPECIMEN:  {SPECIMEN DISPOSITION:204680}  COUNTS:  {OR COUNTS CORRECT/INCORRECT:204690}  TOURNIQUET:  * No tourniquets in log *  DICTATION: .{DICTATION [phone]}  PLAN OF CARE: {OPTIME PLAN OF UXLK:4401027253}  PATIENT DISPOSITION:  {op note disposition:31782}   Delay start of Pharmacological VTE agent (>24hrs) due to surgical blood loss or risk of bleeding: {YES/NO/NOT APPLICABLE:20182}

## 2023-12-13 NOTE — Transfer of Care (Signed)
Immediate Anesthesia Transfer of Care Note  Patient: Margaret Porter  Procedure(s) Performed: DILATATION & CURETTAGE/HYSTEROSCOPY WITH MYOSURE  Patient Location: PACU  Anesthesia Type:General  Level of Consciousness: drowsy and patient cooperative  Airway & Oxygen Therapy: Patient Spontanous Breathing and Patient connected to face mask oxygen  Post-op Assessment: Report given to RN and Post -op Vital signs reviewed and stable  Post vital signs: Reviewed and stable  Last Vitals:  Vitals Value Taken Time  BP 138/69 12/13/23 1401  Temp    Pulse 64 12/13/23 1406  Resp 21 12/13/23 1406  SpO2 98 % 12/13/23 1406  Vitals shown include unfiled device data.  Last Pain:  Vitals:   12/13/23 1127  TempSrc: Oral  PainSc: 3       Patients Stated Pain Goal: 4 (12/13/23 1127)  Complications: No notable events documented.

## 2023-12-13 NOTE — Anesthesia Procedure Notes (Signed)
Procedure Name: Intubation Date/Time: 12/13/2023 1:21 PM  Performed by: Dairl Ponder, CRNAPre-anesthesia Checklist: Patient identified, Emergency Drugs available, Suction available and Patient being monitored Patient Re-evaluated:Patient Re-evaluated prior to induction Oxygen Delivery Method: Circle System Utilized Preoxygenation: Pre-oxygenation with 100% oxygen Induction Type: IV induction, Rapid sequence and Cricoid Pressure applied Ventilation: Mask ventilation without difficulty Laryngoscope Size: Mac and 3 Grade View: Grade I Tube type: Oral Tube size: 7.0 mm Number of attempts: 1 Airway Equipment and Method: Stylet and Oral airway Placement Confirmation: ETT inserted through vocal cords under direct vision, positive ETCO2 and breath sounds checked- equal and bilateral Secured at: 22 cm Tube secured with: Tape Dental Injury: Teeth and Oropharynx as per pre-operative assessment

## 2023-12-13 NOTE — Interval H&P Note (Signed)
History and Physical Interval Note:  12/13/2023 11:27 AM  Margaret Porter  has presented today for surgery, with the diagnosis of Postmenopausal bleeding, endometrial thickening.  The various methods of treatment have been discussed with the patient and family. After consideration of risks, benefits and other options for treatment, the patient has consented to  DIAGNOSTIC HYSTEROSCOPY, HYSTEROSCOPIC RESECTION OF ENDOMETRIAL MASS USING MYOSURE, DILATION AND CURETTAGE as a surgical intervention.  The patient's history has been reviewed, patient examined, no change in status, stable for surgery.  I have reviewed the patient's chart and labs.  Questions were answered to the patient's satisfaction.     Imaan Padgett A Gessica Jawad

## 2023-12-13 NOTE — Anesthesia Postprocedure Evaluation (Signed)
Anesthesia Post Note  Patient: Margaret Porter  Procedure(s) Performed: DILATATION & CURETTAGE/HYSTEROSCOPY WITH MYOSURE     Patient location during evaluation: PACU Anesthesia Type: General Level of consciousness: awake and alert Pain management: pain level controlled Vital Signs Assessment: post-procedure vital signs reviewed and stable Respiratory status: spontaneous breathing, nonlabored ventilation and respiratory function stable Cardiovascular status: stable and blood pressure returned to baseline Anesthetic complications: no   No notable events documented.  Last Vitals:  Vitals:   12/13/23 1515 12/13/23 1530  BP: 122/77 129/75  Pulse: 64 (!) 56  Resp: 16 (!) 22  Temp:  36.7 C  SpO2: 95% 95%                  Beryle Lathe

## 2023-12-14 ENCOUNTER — Encounter (HOSPITAL_BASED_OUTPATIENT_CLINIC_OR_DEPARTMENT_OTHER): Payer: Self-pay | Admitting: Obstetrics and Gynecology

## 2023-12-16 LAB — SURGICAL PATHOLOGY

## 2023-12-30 ENCOUNTER — Ambulatory Visit: Payer: BC Managed Care – PPO | Admitting: Family

## 2024-01-02 ENCOUNTER — Other Ambulatory Visit: Payer: Self-pay

## 2024-01-02 ENCOUNTER — Ambulatory Visit: Payer: BC Managed Care – PPO | Admitting: Neurology

## 2024-01-02 DIAGNOSIS — Z006 Encounter for examination for normal comparison and control in clinical research program: Secondary | ICD-10-CM

## 2024-01-02 NOTE — Research (Signed)
 Individual seen for Unscheduled Research Visit for Eyewitness, [phone], A Phase 3b, multicenter, single-arm, open-label study evaluating the efficacy, safety, and tolerability of switching to DTG/3TC single tablet regimen administered once daily from a bictegravir/emtricitabine/tenofovir  alafenamide single tablet regimen in people living with HIV of atleast 56 years of age who are virologically suppressed.Individual seen for lab recollect. Will plan to see again in May 2025.

## 2024-01-06 LAB — HM MAMMOGRAPHY

## 2024-01-07 ENCOUNTER — Encounter: Payer: Self-pay | Admitting: Medical

## 2024-01-15 NOTE — Progress Notes (Signed)
[facility] [address] [place] [place] [address] [phone]       OFFICE FOLLOW UP NOTE  Ms. MARYA FAVERO Date of Birth:  [birth year] Medical Record Number:  [id number]    Primary neurologist: Dr. Frances Furbish Reason for visit: Initial CPAP follow-up    SUBJECTIVE:   CHIEF COMPLAINT:  Chief Complaint  Patient presents with   Follow-up    Pt in 8 Pt here for cpap f/u Pt states no questions or concerns for today's visit     Follow-up visit:   Brief HPI:   TROI RIRIE is a 54 y.o. female with PMH of prediabetes, sickle cell trait, A-flutter, HIV and morbid obesity with BMI of over 45 who was evaluated by Dr. Frances Furbish on 09/16/2019 for for concern of underlying sleep apnea with reported snoring, sleep disruption, nonrestorative sleep and daytime tiredness.  ESS 7/24.  HST 11/06/2023 showed severe sleep apnea with total AHI of 62.8/h and O2 nadir of 72% with significant time below 88% over 30 minutes indicating nocturnal hypoxemia.  AutoPap initiated 11/19/2023.     Interval history:  Patient is being seen for initial CPAP compliance visit.  Compliance report shows satisfactory usage with residual AHI 5.4.  Reports she feels like she is sleeping better but still has some difficulty falling asleep and still some daytime fatigue. ESS 7/24 (prior to CPAP 7/24).  Initially using FFM which she had difficulty tolerating, now using nasal pillow which is better. DME [facility].  No questions or concerns today.        ROS:   14 system review of systems performed and negative with exception of those listed in HPI  PMH:  Past Medical History:  Diagnosis Date   Allergic rhinitis    Allergy    Atrial flutter (HCC)    Follows w/ [facility]. Patient was taking Metoprolol daily. Per patient, she was told by the A-fib clinic that she no longer needed to take daily if she was not having any palpitations.   CKD stage 3a, GFR 45-59  ml/min (HCC)    Follows w/ PCP @ [facility].   Dysrhythmia    Family history of adverse reaction to anesthesia    Patient states that her mother has difficulty waking up from anesthesia.   Food allergy    Hepatic steatosis    Follows w/ [facility]. Patient also w/ hepatic fibrosis, see 12/08/23 [facility] Liver Exam in Care Everywhere.   HIV infection (HCC) 2006   Follows w/ [facility].   Idiopathic urticaria    Joint pain    Lytic bone lesions on xray 2020   bone lesions on hip,sternum and spine, not related to a malignancy per pt   Mixed hyperlipidemia    Follows w/ PCP @ [facility].   Obesity    Pre-diabetes    pt not on meds   Rosacea    Sickle cell trait (HCC)    Sleep apnea    11/06/23 Home sleep study showed severe obstructive sleep apnea.   SVT (supraventricular tachycardia) (HCC) 08/19/2023   Patient was having palpitations. EMS had concern for SVT. Two rounds of adenosine given. Patient had episode of asytole and syncope. Patient responded spontaneously. No CPR or ALS needed.   Swelling of both lower extremities    per pt, left side is more swollen   Vitamin D deficiency    Wears glasses  PSH:  Past Surgical History:  Procedure Laterality Date   COLONOSCOPY  03/09/2019   diverticulosis sigmoid, int hemorrhoids, Dr. Marsa Aris   DILATATION & CURETTAGE/HYSTEROSCOPY WITH MYOSURE N/A 12/13/2023   Procedure: DILATATION & CURETTAGE/HYSTEROSCOPY WITH MYOSURE;  Surgeon: Maxie Better, MD;  Location: [facility];  Service: Gynecology;  Laterality: N/A;   DILATATION & CURRETTAGE/HYSTEROSCOPY WITH RESECTOCOPE N/A 06/27/2015   Procedure: DILATATION & CURETTAGE/HYSTEROSCOPY WITH RESECTOCOPE;  Surgeon: Maxie Better, MD;  Location: [facility];  Service: Gynecology;  Laterality: N/A;   DOPPLER ECHOCARDIOGRAPHY  09/05/2023   EF 60 - 65%   LAPAROSCOPIC APPENDECTOMY N/A  09/07/2017   Procedure: APPENDECTOMY LAPAROSCOPIC;  Surgeon: Berna Bue, MD;  Location: [facility];  Service: General;  Laterality: N/A;    Social History:  Social History   Socioeconomic History   Marital status: Single    Spouse name: Not on file   Number of children: Not on file   Years of education: Not on file   Highest education level: Not on file  Occupational History   Occupation: Runner, broadcasting/film/video and coach  Tobacco Use   Smoking status: Never   Smokeless tobacco: Never   Tobacco comments:    Never smoked 11/27/23  Vaping Use   Vaping status: Never Used  Substance and Sexual Activity   Alcohol use: Yes    Comment: occasional (special occasions) about twice per month   Drug use: No   Sexual activity: Not on file  Other Topics Concern   Not on file  Social History Narrative   Teacher, 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   Single, no children   Exercise - walking   12/2022   Pt lives alone    Social Drivers of Health   Financial Resource Strain: Not on file  Food Insecurity: Not on file  Transportation Needs: Not on file  Physical Activity: Not on file  Stress: Not on file  Social Connections: Not on file  Intimate Partner Violence: Not on file    Family History:  Family History  Problem Relation Age of Onset   Heart disease Mother        cardiomyopathy related to covid infection   Diabetes Mother    Heart disease Father        CAD, considering pacemaker 2023   Diabetes Father    High blood pressure Father    Arrhythmia Father    Diabetes Brother    Heart disease Paternal Uncle    Heart disease Maternal Grandmother    Cancer Maternal Grandmother        breast   Heart disease Paternal Grandmother    Cancer Paternal Grandmother        breast   Colon cancer Neg Hx    Colon polyps Neg Hx    Esophageal cancer Neg Hx    Rectal cancer Neg Hx    Stomach cancer Neg Hx    Sleep apnea Neg Hx     Medications:   Current Outpatient Medications  on File Prior to Visit  Medication Sig Dispense Refill   Cholecalciferol (VITAMIN D) 50 MCG (2000 UT) tablet Take 8,000 Units by mouth daily.     diphenhydrAMINE (BENADRYL) 25 MG tablet Take 25 mg by mouth every 6 (six) hours as needed for itching or allergies. Reported on 02/29/2016     Pitavastatin Calcium 4 MG TABS Take 1 tablet (4 mg total) by mouth daily. 30 tablet 5   Study - [facility] [phone]) -  [id number] (dolutegravir/lamivudine) 50-300 mg tablet (PI[place]) Take 1 tablet by mouth daily. 180 tablet 0   metoprolol succinate (TOPROL-XL) 25 MG 24 hr tablet Take 0.5 tablets (12.5 mg total) by mouth at bedtime. (Patient not taking: Reported on 12/13/2023) 15 tablet 3   topiramate (TOPAMAX) 25 MG tablet Take 1 tablet (25 mg total) by mouth at bedtime. 25 mg at bedtime nightly x 1 week and if tolerating well, increase to 50 mg at bedtime. (Patient not taking: Reported on 01/16/2024) 45 tablet 0   No current facility-administered medications on file prior to visit.    Allergies:   Allergies  Allergen Reactions   Molds & Smuts     SOB, chest congestion   Oxycodone     Questionable mouth itching, sweats, but was also simultaneously on Cipro, Flagyl, and Robaxin.   Wellbutrin [Bupropion] Other (See Comments)    Eyes swelled 3 hours after taking one dose   Red Dye #40 (Allura Red)     Caused a rash with large amounts   Amoxicillin Rash   Aspirin Anxiety   Sulfamethoxazole-Trimethoprim Rash      OBJECTIVE:  Physical Exam  Vitals:   01/16/24 0832  BP: 135/74  Pulse: 72  Weight: 293 lb 12.8 oz (133.3 kg)  Height: 5\' 3"  (1.6 m)   Body mass index is 52.04 kg/m. No results found.   General: well developed, well nourished, very pleasant middle-age female, seated, in no evident distress Head: head normocephalic and atraumatic.   Neck: supple with no carotid or supraclavicular bruits Cardiovascular: regular rate and rhythm, no murmurs Musculoskeletal: no deformity Skin:  no  rash/petichiae Vascular:  Normal pulses all extremities   Neurologic Exam Mental Status: Awake and fully alert. Oriented to place and time. Recent and remote memory intact. Attention span, concentration and fund of knowledge appropriate. Mood and affect appropriate.  Cranial Nerves: Pupils equal, briskly reactive to light. Extraocular movements full without nystagmus. Visual fields full to confrontation. Hearing intact. Facial sensation intact. Face, tongue, palate moves normally and symmetrically.  Motor: Normal bulk and tone. Normal strength in all tested extremity muscles Gait and Station: Arises from chair without difficulty. Stance is normal. Gait demonstrates normal stride length and balance without use of AD.         ASSESSMENT/PLAN: CRISTIANNA DIETEL is a 54 y.o. year old female    Severe OSA on CPAP :  Nocturnal hypoxemia:  Compliance report shows satisfactory usage with residual AHI 5.2.   As she continues to experience daytime fatigue, will slightly adjust pressure setting from 7-14 to 7-15 and recommend completion of ONO to ensure resolution of nocturnal hypoxemia on PAP therapy.  Also discussed improving sleep duration (average sleep 5hrs and ) as this could also be contributing to continued daytime fatigue.   Discussed continued nightly usage with ensuring greater than 4 hours nightly for optimal benefit and per insurance purposes.   Continue to follow with DME company for any needed supplies or CPAP related concerns     Follow up in 6 months or call earlier if needed   CC:  PCP: Tysinger, Kermit Balo, PA-C    I spent 25 minutes of face-to-face and non-face-to-face time with patient.  This included previsit chart review, lab review, study review, order entry, electronic health record documentation, patient education and discussion regarding above diagnoses and treatment plan and answered all other questions to patient's satisfaction  Ihor Austin,  AGNP-BC  [facility] [address] [place],  [place] [address]  Phone [phone] Fax [phone] Note: This document was prepared with digital dictation and possible smart phrase technology. Any transcriptional errors that result from this process are unintentional.

## 2024-01-16 ENCOUNTER — Ambulatory Visit: Payer: 59 | Admitting: Adult Health

## 2024-01-16 ENCOUNTER — Encounter: Payer: Self-pay | Admitting: Adult Health

## 2024-01-16 VITALS — BP 135/74 | HR 72 | Ht 63.0 in | Wt 293.8 lb

## 2024-01-16 DIAGNOSIS — G4733 Obstructive sleep apnea (adult) (pediatric): Secondary | ICD-10-CM | POA: Diagnosis not present

## 2024-01-16 DIAGNOSIS — G4734 Idiopathic sleep related nonobstructive alveolar hypoventilation: Secondary | ICD-10-CM | POA: Diagnosis not present

## 2024-01-16 NOTE — Patient Instructions (Addendum)
Your Plan:  Continue nightly CPAP usage with ensuring greater than 4 hours per night for optimal benefit and per insurance requirements  We will make a slight adjustment to your pressure settings  Continue to follow with your DME [facility] for any needs supplies or CPAP related concerns  Recommend completion of overnight oximetry test to ensure resolution of low oxygen levels with use of CPAP     Follow up in 6 months or call earlier if needed     Thank you for coming to see [name] at [facility]. I hope we have been able to provide you high quality care today.  You may receive a patient satisfaction survey over the next few weeks. We would appreciate your feedback and comments so that we may continue to improve ourselves and the health of our patients.

## 2024-01-28 ENCOUNTER — Ambulatory Visit: Payer: BC Managed Care – PPO | Admitting: Family

## 2024-01-28 ENCOUNTER — Ambulatory Visit (INDEPENDENT_AMBULATORY_CARE_PROVIDER_SITE_OTHER): Payer: 59 | Admitting: Medical

## 2024-01-28 ENCOUNTER — Encounter: Payer: Self-pay | Admitting: Medical

## 2024-01-28 VITALS — BP 120/74 | HR 99 | Ht 64.0 in | Wt 288.4 lb

## 2024-01-28 DIAGNOSIS — K760 Fatty (change of) liver, not elsewhere classified: Secondary | ICD-10-CM

## 2024-01-28 DIAGNOSIS — Z0000 Encounter for general adult medical examination without abnormal findings: Secondary | ICD-10-CM | POA: Diagnosis not present

## 2024-01-28 DIAGNOSIS — Z6841 Body Mass Index (BMI) 40.0 and over, adult: Secondary | ICD-10-CM

## 2024-01-28 DIAGNOSIS — Z1211 Encounter for screening for malignant neoplasm of colon: Secondary | ICD-10-CM

## 2024-01-28 DIAGNOSIS — E7849 Other hyperlipidemia: Secondary | ICD-10-CM

## 2024-01-28 DIAGNOSIS — Z23 Encounter for immunization: Secondary | ICD-10-CM

## 2024-01-28 DIAGNOSIS — R7301 Impaired fasting glucose: Secondary | ICD-10-CM | POA: Insufficient documentation

## 2024-01-28 DIAGNOSIS — Z1389 Encounter for screening for other disorder: Secondary | ICD-10-CM

## 2024-01-28 DIAGNOSIS — R7989 Other specified abnormal findings of blood chemistry: Secondary | ICD-10-CM

## 2024-01-28 DIAGNOSIS — B20 Human immunodeficiency virus [HIV] disease: Secondary | ICD-10-CM

## 2024-01-28 DIAGNOSIS — E88819 Insulin resistance, unspecified: Secondary | ICD-10-CM

## 2024-01-28 LAB — LIPID PANEL

## 2024-01-28 NOTE — Progress Notes (Signed)
Subjective:   HPI  Margaret Porter is a 54 y.o. female who presents for Chief Complaint  Patient presents with   Annual Exam    Fasting cpe, no concerns.     Patient Care Team: Chaunice Obie, Kermit Balo, PA-C as PCP - General (Family Medicine) Veryl Speak, FNP as Nurse Practitioner (Infectious Diseases) Maxie Better, MD as Consulting Physician (Obstetrics and Gynecology) Ihor Austin, NP as Nurse Practitioner (Neurology) Eustace Pen, PA-C as Physician Assistant (Cardiology) Huston Foley, MD as Attending Physician (Neurology) Helane Rima, DO as Referring Physician [facility]) Sees dentist Sees eye doctor   Concerns: Not doing a lot of exercise  Going to [facility] currently to work on diet, weight.   Just started on Topamax to help with weight.  Has follow up 02/03/24.    Compliant with cholesterol medicaiton.  On HIV therapy, in a study currently  Reviewed their medical, surgical, family, social, medication, and allergy history and updated chart as appropriate.  Past Medical History:  Diagnosis Date   Allergic rhinitis    Allergy    Atrial flutter (HCC)    Follows w/ [facility] at [facility]. Patient was taking Metoprolol daily. Per patient, she was told by the A-fib clinic that she no longer needed to take daily if she was not having any palpitations.   CKD stage 3a, GFR 45-59 ml/min (HCC)    Follows w/ PCP @ [facility].   Dysrhythmia    Family history of adverse reaction to anesthesia    Patient states that her mother has difficulty waking up from anesthesia.   Food allergy    Hepatic steatosis    Follows w/ [facility]. Patient also w/ hepatic fibrosis, see 12/08/23 [facility] Liver Exam in Care Everywhere.   HIV infection (HCC) 2006   Follows w/ [facility].   Idiopathic urticaria    Joint pain    Lytic bone lesions on xray 2020   bone lesions on  hip,sternum and spine, not related to a malignancy per pt   Mixed hyperlipidemia    Follows w/ PCP @ [facility].   Obesity    Pre-diabetes    pt not on meds   Rosacea    Sickle cell trait (HCC)    Sleep apnea    11/06/23 Home sleep study showed severe obstructive sleep apnea.   SVT (supraventricular tachycardia) (HCC) 08/19/2023   Patient was having palpitations. EMS had concern for SVT. Two rounds of adenosine given. Patient had episode of asytole and syncope. Patient responded spontaneously. No CPR or ALS needed.   Swelling of both lower extremities    per pt, left side is more swollen   Vitamin D deficiency    Wears glasses    Past Surgical History:  Procedure Laterality Date   COLONOSCOPY  03/09/2019   diverticulosis sigmoid, int hemorrhoids, repeat 10 years.  Dr. Marsa Aris   DILATATION & CURETTAGE/HYSTEROSCOPY WITH MYOSURE N/A 12/13/2023   Procedure: DILATATION & CURETTAGE/HYSTEROSCOPY WITH MYOSURE;  Surgeon: Maxie Better, MD;  Location: [facility];  Service: Gynecology;  Laterality: N/A;   DILATATION & CURRETTAGE/HYSTEROSCOPY WITH RESECTOCOPE N/A 06/27/2015   Procedure: DILATATION & CURETTAGE/HYSTEROSCOPY WITH RESECTOCOPE;  Surgeon: Maxie Better, MD;  Location: [facility];  Service: Gynecology;  Laterality: N/A;   DOPPLER ECHOCARDIOGRAPHY  09/05/2023   EF 60 - 65%   LAPAROSCOPIC APPENDECTOMY N/A 09/07/2017   Procedure: APPENDECTOMY LAPAROSCOPIC;  Surgeon: Phylliss Blakes  A, MD;  Location: [facility];  Service: General;  Laterality: N/A;     Family History  Problem Relation Age of Onset   Heart disease Mother        cardiomyopathy related to covid infection   Diabetes Mother    Heart disease Father        CAD, considering pacemaker 2023   Diabetes Father    High blood pressure Father    Arrhythmia Father    Diabetes Brother    Heart disease Paternal Uncle    Heart disease Maternal Grandmother    Cancer Maternal Grandmother         breast   Heart disease Paternal Grandmother    Cancer Paternal Grandmother        breast   Colon cancer Neg Hx    Colon polyps Neg Hx    Esophageal cancer Neg Hx    Rectal cancer Neg Hx    Stomach cancer Neg Hx    Sleep apnea Neg Hx      Current Outpatient Medications:    Cholecalciferol (VITAMIN D) 50 MCG (2000 UT) tablet, Take 8,000 Units by mouth daily., Disp: , Rfl:    Pitavastatin Calcium 4 MG TABS, Take 1 tablet (4 mg total) by mouth daily., Disp: 30 tablet, Rfl: 5   Study - EYEWITNESS ([id number]) - [id number] (dolutegravir/lamivudine) 50-300 mg tablet ([place]), Take 1 tablet by mouth daily., Disp: 180 tablet, Rfl: 0   diphenhydrAMINE (BENADRYL) 25 MG tablet, Take 25 mg by mouth every 6 (six) hours as needed for itching or allergies. Reported on 02/29/2016 (Patient not taking: Reported on 01/28/2024), Disp: , Rfl:   Allergies  Allergen Reactions   Molds & Smuts     SOB, chest congestion   Oxycodone     Questionable mouth itching, sweats, but was also simultaneously on Cipro, Flagyl, and Robaxin.   Wellbutrin [Bupropion] Other (See Comments)    Eyes swelled 3 hours after taking one dose   Red Dye #40 (Allura Red)     Caused a rash with large amounts   Amoxicillin Rash   Aspirin Anxiety   Sulfamethoxazole-Trimethoprim Rash         01/28/2024    8:32 AM 01/23/2023    8:22 AM 12/12/2022    8:30 AM 11/14/2022   11:22 AM 01/22/2022    8:39 AM  Depression screen PHQ 2/9  Decreased Interest 0 0 0 0 0  Down, Depressed, Hopeless 0 0 0 0 0  PHQ - 2 Score 0 0 0 0 0   Review of Systems  Constitutional:  Negative for chills, fever, malaise/fatigue and weight loss.  HENT:  Negative for congestion, ear pain, hearing loss, sore throat and tinnitus.   Eyes:  Negative for blurred vision, pain and redness.  Respiratory:  Negative for cough, hemoptysis and shortness of breath.   Cardiovascular:  Negative for chest pain, palpitations, orthopnea, claudication and leg swelling.   Gastrointestinal:  Negative for abdominal pain, blood in stool, constipation, diarrhea, nausea and vomiting.  Genitourinary:  Negative for dysuria, flank pain, frequency, hematuria and urgency.  Musculoskeletal:  Negative for falls, joint pain and myalgias.  Skin:  Negative for itching and rash.  Neurological:  Negative for dizziness, tingling, speech change, weakness and headaches.  Endo/Heme/Allergies:  Negative for polydipsia. Does not bruise/bleed easily.  Psychiatric/Behavioral:  Negative for depression and memory loss. The patient is not nervous/anxious and does not have insomnia.        Objective:  BP 120/74  Pulse 99   Ht 5\' 4"  (1.626 m)   Wt 288 lb 6.4 oz (130.8 kg)   LMP  (LMP Unknown)   BMI 49.50 kg/m   Wt Readings from Last 3 Encounters:  01/28/24 288 lb 6.4 oz (130.8 kg)  01/16/24 293 lb 12.8 oz (133.3 kg)  12/13/23 286 lb 9.6 oz (130 kg)    BP Readings from Last 3 Encounters:  01/28/24 120/74  01/16/24 135/74  12/13/23 (!) 142/81    General appearance: alert, no distress, WD/WN, African American female Skin: unremarkable HEENT: normocephalic, conjunctiva/corneas normal, sclerae anicteric, PERRLA, EOMi, nares patent, no discharge or erythema, pharynx normal Oral cavity: MMM, tongue normal, teeth in good repair Neck: supple, no lymphadenopathy, no thyromegaly, no masses, normal ROM, no bruits Chest: non tender, normal shape and expansion Heart: RRR, normal S1, S2, no murmurs Lungs: CTA bilaterally, no wheezes, rhonchi, or rales Abdomen: +bs, soft, non tender, non distended, no masses, no hepatomegaly, no splenomegaly, no bruits Back: non tender, normal ROM, no scoliosis Musculoskeletal: upper extremities non tender, no obvious deformity, normal ROM throughout, lower extremities non tender, no obvious deformity, normal ROM throughout Extremities: no edema, no cyanosis, no clubbing Pulses: 2+ symmetric, upper and lower extremities, normal cap  refill Neurological: alert, oriented x 3, CN2-12 intact, strength normal upper extremities and lower extremities, sensation normal throughout, DTRs 2+ throughout, no cerebellar signs, gait normal Psychiatric: normal affect, behavior normal, pleasant  Breast/gyn/rectal - deferred to gynecology     Assessment and Plan :   Encounter Diagnoses  Name Primary?   Encounter for health maintenance examination in adult Yes   Needs flu shot    Screening for colon cancer    Impaired fasting blood sugar    Screening for hematuria or proteinuria    BMI 45.0-49.9, adult (HCC) Current BMI 48.5    Hepatic steatosis    Human immunodeficiency virus (HIV) disease (HCC)    Insulin resistance    Need for pneumococcal vaccination    Other hyperlipidemia    Elevated serum creatinine      This visit was a preventative care visit, also known as wellness visit or routine physical.   Topics typically include healthy lifestyle, diet, exercise, preventative care, vaccinations, sick and well care, proper use of emergency dept and after hours care, as well as other concerns.     Recommendations: Continue to return yearly for your annual wellness and preventative care visits.  This gives [name] a chance to discuss healthy lifestyle, exercise, vaccinations, review your chart record, and perform screenings where appropriate.  I recommend you see your eye doctor yearly for routine vision care.  I recommend you see your dentist yearly for routine dental care including hygiene visits twice yearly.  Continue routine follow up with gynecology   Vaccination recommendations were reviewed Immunization History  Administered Date(s) Administered   H1N1 04/12/2009   Hepatitis B 08/31/2004, 10/03/2004, 05/08/2005   Influenza Split 11/21/2011, 10/14/2012   Influenza Whole 11/11/2006, 10/02/2007, 10/11/2009, 10/24/2010   Influenza, Seasonal, Injecte, Preservative Fre 01/28/2024   Influenza,inj,Quad PF,6+ Mos 10/20/2013,  11/29/2014, 11/23/2015, 10/29/2016, 09/18/2017, 11/24/2019, 11/15/2020, 11/15/2021, 09/25/2022   PFIZER(Purple Top)SARS-COV-2 Vaccination 02/27/2020, 03/19/2020, 12/09/2020   Pfizer Covid-19 Vaccine Bivalent Booster 81yrs & up 11/15/2021   Pfizer(Comirnaty)Fall Seasonal Vaccine 12 years and older 11/14/2022   Pneumococcal Polysaccharide-23 07/18/2009, 08/16/2014, 01/20/2019, 01/28/2024   Tdap 12/22/2015   Shingles vaccine:  I recommend you have a shingles vaccine to help prevent shingles or herpes zoster outbreak.   Please call  your insurer to inquire about coverage for the Shingrix vaccine given in 2 doses.   Some insurers cover this vaccine after age 84, some cover this after age 64.  If your insurer covers this, then call to schedule appointment to have this vaccine here.  Counseled on the pneumococcal vaccine.  Vaccine information sheet given.  Pneumococcal vaccine PPSV23 given after consent obtained.   Counseled on the influenza virus vaccine.  Vaccine information sheet given.  Influenza vaccine given after consent obtained.    Screening for cancer: Colon cancer screening: Reviewed 2020 visit.  Repeat 10 years, but due for hemoccult testing.   Will send home with kit.  Breast cancer screening: You should perform a self breast exam monthly.   I reviewed her normal mammogram from January 2025.  Cervical cancer screening: We reviewed recommendations for pap smear screening.  Pap smear from 2022 reviewed.  Sees gynecology for this.   Skin cancer screening: Check your skin regularly for new changes, growing lesions, or other lesions of concern Come in for evaluation if you have skin lesions of concern.  Lung cancer screening: If you have a greater than 20 pack year history of tobacco use, then you may qualify for lung cancer screening with a chest CT scan.   Please call your insurance company to inquire about coverage for this test.  We currently don't have screenings for other  cancers besides breast, cervical, colon, and lung cancers.  If you have a strong family history of cancer or have other cancer screening concerns, please let me know.    Bone health: Get at least 150 minutes of aerobic exercise weekly Get weight bearing exercise at least once weekly Bone density test:  A bone density test is an imaging test that uses a type of X-ray to measure the amount of calcium and other minerals in your bones. The test may be used to diagnose or screen you for a condition that causes weak or thin bones (osteoporosis), predict your risk for a broken bone (fracture), or determine how well your osteoporosis treatment is working. The bone density test is recommended for females 65 and older, or females or males <65 if certain risk factors such as thyroid disease, long term use of steroids such as for asthma or rheumatological issues, vitamin D deficiency, estrogen deficiency, family history of osteoporosis, self or family history of fragility fracture in first degree relative.  Bone density reviewed from 2021, normal.    Heart health: Get at least 150 minutes of aerobic exercise weekly Limit alcohol It is important to maintain a healthy blood pressure and healthy cholesterol numbers  Heart disease screening: Screening for heart disease includes screening for blood pressure, fasting lipids, glucose/diabetes screening, BMI height to weight ratio, reviewed of smoking status, physical activity, and diet.    Goals include blood pressure 120/80 or less, maintaining a healthy lipid/cholesterol profile, preventing diabetes or keeping diabetes numbers under good control, not smoking or using tobacco products, exercising most days per week or at least 150 minutes per week of exercise, and eating healthy variety of fruits and vegetables, healthy oils, and avoiding unhealthy food choices like fried food, fast food, high sugar and high cholesterol foods.    Other tests may possibly  include EKG test, CT coronary calcium score, echocardiogram, exercise treadmill stress test.   CT chest 2020 showed no CAD or atherosclerosis.  Discussed possibly repeating this test periodically.      Medical care options: I recommend you continue to seek  care here first for routine care.  We try really hard to have available appointments Monday through Friday daytime hours for sick visits, acute visits, and physicals.  Urgent care should be used for after hours and weekends for significant issues that cannot wait till the next day.  The emergency department should be used for significant potentially life-threatening emergencies.  The emergency department is expensive, can often have long wait times for less significant concerns, so try to utilize primary care, urgent care, or telemedicine when possible to avoid unnecessary trips to the emergency department.  Virtual visits and telemedicine have been introduced since the pandemic started in 2020, and can be convenient ways to receive medical care.  We offer virtual appointments as well to assist you in a variety of options to seek medical care.   Advanced Directives: I recommend you consider completing a Health Care Power of Attorney and Living Will.   These documents respect your wishes and help alleviate burdens on your loved ones if you were to become terminally ill or be in a position to need those documents enforced.    You can complete Advanced Directives yourself, have them notarized, then have copies made for our office, for you and for anybody you feel should have them in safe keeping.  Or, you can have an attorney prepare these documents.   If you haven't updated your Last Will and Testament in a while, it may be worthwhile having an attorney prepare these documents together and save on some costs.       Separate significant issues discussed: Obesity - not exercising.  I recommend she get disciplined with exercise.  She reportedly just  darted Topamax with [facility] in the last few weeks.  Reiterated the need to work on both healthy eating habits, regular exercise as well as medication.  Vitamin D deficiency - continue supplement  Insulin resistance - counseled on diet, exercise, risk of diabetes.    Hyperlipidemia - continue statin, labs today  HIV disease - on therapy, managed by infectious disease    Kimmy was seen today for annual exam.  Diagnoses and all orders for this visit:  Encounter for health maintenance examination in adult -     POCT Urinalysis DIP (Proadvantage Device) -     Microalbumin/Creatinine Ratio, Urine -     Hemoglobin A1c -     Lipid panel  Needs flu shot -     Flu vaccine trivalent PF, 6mos and older(Flulaval,Afluria,Fluarix,Fluzone)  Screening for colon cancer -     Fecal occult blood, imunochemical  Impaired fasting blood sugar -     Hemoglobin A1c  Screening for hematuria or proteinuria  BMI 45.0-49.9, adult (HCC) Current BMI 48.5  Hepatic steatosis  Human immunodeficiency virus (HIV) disease (HCC)  Insulin resistance  Need for pneumococcal vaccination -     Pneumococcal polysaccharide vaccine 23-valent greater than or equal to 2yo subcutaneous/IM  Other hyperlipidemia -     Lipid panel  Elevated serum creatinine   Follow-up pending labs, yearly for physical

## 2024-01-29 LAB — MICROALBUMIN / CREATININE URINE RATIO: Creatinine, Urine: 98.9 mg/dL

## 2024-01-29 LAB — HEMOGLOBIN A1C
Est. average glucose Bld gHb Est-mCnc: 117 mg/dL
Hgb A1c MFr Bld: 5.7 % — ABNORMAL HIGH (ref 4.8–5.6)

## 2024-01-29 LAB — LIPID PANEL
Cholesterol, Total: 100 mg/dL (ref 100–199)
HDL: 34 mg/dL — ABNORMAL LOW (ref >39)
LDL CALC COMMENT:: 2.9 ratio (ref 0.0–4.4)
LDL Chol Calc (NIH): 48 mg/dL (ref 0–99)
Triglycerides: 89 mg/dL (ref 0–149)
VLDL Cholesterol Cal: 18 mg/dL (ref 5–40)

## 2024-01-30 ENCOUNTER — Encounter: Payer: Self-pay | Admitting: Family

## 2024-01-30 ENCOUNTER — Other Ambulatory Visit: Payer: Self-pay

## 2024-01-30 ENCOUNTER — Ambulatory Visit: Payer: 59 | Admitting: Family

## 2024-01-30 VITALS — BP 116/80 | HR 66 | Temp 97.4°F | Wt 288.0 lb

## 2024-01-30 DIAGNOSIS — Z0000 Encounter for general adult medical examination without abnormal findings: Secondary | ICD-10-CM | POA: Insufficient documentation

## 2024-01-30 DIAGNOSIS — B20 Human immunodeficiency virus [HIV] disease: Secondary | ICD-10-CM | POA: Diagnosis not present

## 2024-01-30 NOTE — Assessment & Plan Note (Signed)
Margaret Porter continues to have well-controlled virus with good adherence and tolerance to her ART regimen and currently in the eyewitness study.  Reviewed previous lab work and discussed plan of care and U equals U.  Continue study medication.  Plan for follow-up in 6 months or sooner if needed with lab work on the same day or provided by research.

## 2024-01-30 NOTE — Progress Notes (Signed)
Results sent through MyChart

## 2024-01-30 NOTE — Progress Notes (Signed)
Brief Narrative   Patient ID: Margaret Porter, female    DOB: [birth year], 54 y.o.   MRN: [id number]  Margaret Porter is a 54 y/o AA female diagnosed with HIV disease in August 2005 with risk factor of heterosexual contact. Initial viral load was 54,800 with CD4 count 19. Entered care at [facility] Stage 3. No genotypes available for review. No history of opportunistic infection. ART experienced with [facility], [facility], [facility], and [facility], Currently enrolled in research study [employer] [phone].   Subjective:    Chief Complaint  Patient presents with   Follow-up    HPI:  Margaret Porter is a 54 y.o. female with HIV disease last seen by Dr. Orvan Falconer on 12/12/2022 with well-controlled virus and good adherence and tolerance to her ART regimen as she was involved in a research study.  Most recent lab work shows undetectable viral load with CD4 count that was not able to be completed.  Kidney function, liver function, electrolytes within normal ranges.  Here today for routine follow-up.  Margaret Porter has been doing well since her last office visit she has had appointments for her SVT and fibroids but overall feeling well.  Does have some concern about weight gain and is interested in losing weight.  She remains in a research study at this time who is managing her ART therapy.  Working full-time as a Midwife with stable housing and access to food.  Transportation via car.  Condoms and site-specific STD testing offered.  Vaccinations reviewed and is considering Shingrix.  Routine dental care is up-to-date per recommendations.  Denies fevers, chills, night sweats, headaches, changes in vision, neck pain/stiffness, nausea, diarrhea, vomiting, lesions or rashes.  Lab Results  Component Value Date   CD4TCELL 45 10/31/2022   CD4TABS 558 10/31/2022   Lab Results  Component Value Date   HIV1RNAQUANT Not Detected 10/31/2022     Allergies  Allergen Reactions   Molds & Smuts      SOB, chest congestion   Oxycodone     Questionable mouth itching, sweats, but was also simultaneously on Cipro, Flagyl, and Robaxin.   Wellbutrin [Bupropion] Other (See Comments)    Eyes swelled 3 hours after taking one dose   Red Dye #40 (Allura Red)     Caused a rash with large amounts   Amoxicillin Rash   Aspirin Anxiety   Sulfamethoxazole-Trimethoprim Rash      Outpatient Medications Prior to Visit  Medication Sig Dispense Refill   Cholecalciferol (VITAMIN D) 50 MCG (2000 UT) tablet Take 8,000 Units by mouth daily.     diphenhydrAMINE (BENADRYL) 25 MG tablet Take 25 mg by mouth every 6 (six) hours as needed for itching or allergies. Reported on 02/29/2016     Pitavastatin Calcium 4 MG TABS Take 1 tablet (4 mg total) by mouth daily. 30 tablet 5   Study - [facility] [id number]) - [id number] (dolutegravir/lamivudine) 50-300 mg tablet (PI[place]) Take 1 tablet by mouth daily. 180 tablet 0   topiramate (TOPAMAX) 25 MG capsule Take 25 mg by mouth 2 (two) times daily.     No facility-administered medications prior to visit.     Past Medical History:  Diagnosis Date   Allergic rhinitis    Allergy    Atrial flutter (HCC)    Follows w/ [place] A-fib clinic at [facility]. Patient was taking Metoprolol daily. Per patient, she was told by the A-fib clinic that she no longer needed to take daily if she was not  having any palpitations.   CKD stage 3a, GFR 45-59 ml/min (HCC)    Follows w/ PCP @ [facility].   Dysrhythmia    Family history of adverse reaction to anesthesia    Patient states that her mother has difficulty waking up from anesthesia.   Food allergy    Hepatic steatosis    Follows w/ [facility]. Patient also w/ hepatic fibrosis, see 12/08/23 Fibroscan Liver Exam in Care Everywhere.   HIV infection (HCC) 2006   Follows w/ [facility] for Infectious disease.   Idiopathic urticaria    Joint pain    Lytic bone lesions on  xray 2020   bone lesions on hip,sternum and spine, not related to a malignancy per pt   Mixed hyperlipidemia    Follows w/ PCP @ [facility].   Obesity    Pre-diabetes    pt not on meds   Rosacea    Sickle cell trait (HCC)    Sleep apnea    11/06/23 Home sleep study showed severe obstructive sleep apnea.   SVT (supraventricular tachycardia) (HCC) 08/19/2023   Patient was having palpitations. EMS had concern for SVT. Two rounds of adenosine given. Patient had episode of asytole and syncope. Patient responded spontaneously. No CPR or ALS needed.   Swelling of both lower extremities    per pt, left side is more swollen   Vitamin D deficiency    Wears glasses      Past Surgical History:  Procedure Laterality Date   COLONOSCOPY  03/09/2019   diverticulosis sigmoid, int hemorrhoids, repeat 10 years.  Dr. Marsa Aris   DILATATION & CURETTAGE/HYSTEROSCOPY WITH MYOSURE N/A 12/13/2023   Procedure: DILATATION & CURETTAGE/HYSTEROSCOPY WITH MYOSURE;  Surgeon: Maxie Better, MD;  Location: [facility];  Service: Gynecology;  Laterality: N/A;   DILATATION & CURRETTAGE/HYSTEROSCOPY WITH RESECTOCOPE N/A 06/27/2015   Procedure: DILATATION & CURETTAGE/HYSTEROSCOPY WITH RESECTOCOPE;  Surgeon: Maxie Better, MD;  Location: [facility];  Service: Gynecology;  Laterality: N/A;   DOPPLER ECHOCARDIOGRAPHY  09/05/2023   EF 60 - 65%   LAPAROSCOPIC APPENDECTOMY N/A 09/07/2017   Procedure: APPENDECTOMY LAPAROSCOPIC;  Surgeon: Berna Bue, MD;  Location: [facility];  Service: General;  Laterality: N/A;      Review of Systems  Constitutional:  Negative for appetite change, chills, diaphoresis, fatigue, fever and unexpected weight change.  Eyes:        Negative for acute change in vision  Respiratory:  Negative for chest tightness, shortness of breath and wheezing.   Cardiovascular:  Negative for chest pain.  Gastrointestinal:  Negative for diarrhea, nausea and vomiting.   Genitourinary:  Negative for dysuria, pelvic pain and vaginal discharge.  Musculoskeletal:  Negative for neck pain and neck stiffness.  Skin:  Negative for rash.  Neurological:  Negative for seizures, syncope, weakness and headaches.  Hematological:  Negative for adenopathy. Does not bruise/bleed easily.  Psychiatric/Behavioral:  Negative for hallucinations.       Objective:    BP 116/80   Pulse 66   Temp (!) 97.4 F (36.3 C) (Oral)   Wt 288 lb (130.6 kg)   LMP  (LMP Unknown)   SpO2 95%   BMI 49.44 kg/m  Nursing note and vital signs reviewed.  Physical Exam Constitutional:      General: She is not in acute distress.    Appearance: She is well-developed.  Eyes:     Conjunctiva/sclera: Conjunctivae normal.  Cardiovascular:     Rate  and Rhythm: Normal rate and regular rhythm.     Heart sounds: Normal heart sounds. No murmur heard.    No friction rub. No gallop.  Pulmonary:     Effort: Pulmonary effort is normal. No respiratory distress.     Breath sounds: Normal breath sounds. No wheezing or rales.  Chest:     Chest wall: No tenderness.  Abdominal:     General: Bowel sounds are normal.     Palpations: Abdomen is soft.     Tenderness: There is no abdominal tenderness.  Musculoskeletal:     Cervical back: Neck supple.  Lymphadenopathy:     Cervical: No cervical adenopathy.  Skin:    General: Skin is warm and dry.     Findings: No rash.  Neurological:     Mental Status: She is alert and oriented to person, place, and time.  Psychiatric:        Behavior: Behavior normal.        Thought Content: Thought content normal.        Judgment: Judgment normal.         01/28/2024    8:32 AM 01/23/2023    8:22 AM 12/12/2022    8:30 AM 11/14/2022   11:22 AM 01/22/2022    8:39 AM  Depression screen PHQ 2/9  Decreased Interest 0 0 0 0 0  Down, Depressed, Hopeless 0 0 0 0 0  PHQ - 2 Score 0 0 0 0 0       Assessment & Plan:    Patient Active Problem List   Diagnosis  Date Noted   Healthcare maintenance 01/30/2024   Encounter for health maintenance examination in adult 01/28/2024   Screening for colon cancer 01/28/2024   Impaired fasting blood sugar 01/28/2024   Screening for hematuria or proteinuria 01/28/2024   Elevated serum creatinine 01/28/2024   Typical atrial flutter (HCC) 08/27/2023   BMI 45.0-49.9, adult (HCC) Current BMI 48.5 07/10/2023   High risk medication use 11/14/2022   Polyphagia 03/09/2021   Hepatic steatosis 02/14/2021   Venous insufficiency 01/13/2021   Seasonal allergies 05/16/2020   Estrogen deficiency 01/11/2020   Perimenopausal 01/11/2020   Bony sclerosis 02/03/2019   S/P appendectomy 01/20/2019   Vaccine counseling 01/20/2019   Insulin resistance 12/09/2018   Routine general medical examination at a health care facility 12/22/2015   Vitamin D deficiency 12/22/2015   Need for pneumococcal vaccination 12/22/2015   Rosacea 04/22/2012   URINARY INCONTINENCE, URGE 10/24/2010   Other hyperlipidemia 11/06/2007   URTICARIA, IDIOPATHIC 03/06/2007   Human immunodeficiency virus (HIV) disease (HCC) 01/24/2007     Problem List Items Addressed This Visit       Other   Human immunodeficiency virus (HIV) disease (HCC) - Primary   Margaret Porter continues to have well-controlled virus with good adherence and tolerance to her ART regimen and currently in the eyewitness study.  Reviewed previous lab work and discussed plan of care and U equals U.  Continue study medication.  Plan for follow-up in 6 months or sooner if needed with lab work on the same day or provided by research.      Healthcare maintenance   Discussed importance of safe sexual practice and condom use. Condoms and site specific STD testing offered.  Vaccinations reviewed and considering Shingrix. Routine dental care up-to-date. Mammogram, colonoscopy, and cervical cancer screenings up-to-date.        I am having Margaret Porter maintain her diphenhydrAMINE,  Pitavastatin Calcium, EYEWITNESS ([id number]) [id number], Vitamin D,  and topiramate.   Follow-up: Return in about 6 months (around 07/29/2024). or sooner if needed.    Marcos Eke, MSN, FNP-C Nurse Practitioner [facility] Main number: [phone]

## 2024-01-30 NOTE — Patient Instructions (Addendum)
Nice to see you.  Plan for follow up in 6 months or sooner if needed with lab work on the same day.  Have a great day and stay safe!

## 2024-01-30 NOTE — Assessment & Plan Note (Signed)
Discussed importance of safe sexual practice and condom use. Condoms and site specific STD testing offered.  Vaccinations reviewed and considering Shingrix. Routine dental care up-to-date. Mammogram, colonoscopy, and cervical cancer screenings up-to-date.

## 2024-02-04 LAB — FECAL OCCULT BLOOD, IMMUNOCHEMICAL: Fecal Occult Bld: NEGATIVE

## 2024-02-05 ENCOUNTER — Ambulatory Visit: Payer: BC Managed Care – PPO | Admitting: Adult Health

## 2024-04-13 ENCOUNTER — Encounter: Admitting: *Deleted

## 2024-04-13 ENCOUNTER — Other Ambulatory Visit: Payer: Self-pay

## 2024-04-13 VITALS — BP 111/76 | HR 66 | Temp 97.7°F | Wt 289.2 lb

## 2024-04-13 DIAGNOSIS — Z006 Encounter for examination for normal comparison and control in clinical research program: Secondary | ICD-10-CM

## 2024-04-13 NOTE — Research (Unsigned)
 Margaret Porter here today for her week 576 visit for A5321, for the [facility] Study: Decay of HIV-1 Reservoirs in Patients on Long-Term Antiretroviral Therapy. All study procedures completed per protocol. She will return in August to see Marlan Silva, NP and for her next [phone] study visit.

## 2024-04-14 LAB — CREATININE, SERUM: Creat: 1.09 mg/dL — ABNORMAL HIGH (ref 0.50–1.03)

## 2024-04-14 LAB — HEPATITIS C ANTIBODY: Hepatitis C Ab: NONREACTIVE

## 2024-04-28 ENCOUNTER — Telehealth (HOSPITAL_COMMUNITY): Payer: Self-pay | Admitting: *Deleted

## 2024-04-28 MED ORDER — METOPROLOL SUCCINATE ER 25 MG PO TB24: 12.5000 mg | ORAL_TABLET | Freq: Every day | ORAL | Status: AC

## 2024-04-28 NOTE — Telephone Encounter (Signed)
 Patient called in stating over the last 2 weeks she has had a few episodes of racing heart rate. Both occurrences she took metoprolol  and this helped it subside. Discussed with Caesar Caster PA will restart metoprolol  succinate 12.5mg  once a day at bedtime. Call with update of response in 2 weeks. If continues with breakthrough will bring in for assessment. Pt in agreement.

## 2024-05-13 NOTE — Progress Notes (Signed)
 The ASCVD Risk score (Arnett DK, et al., 2019) failed to calculate for the following reasons:   The valid total cholesterol range is 130 to 320 mg/dL  Margaret Porter, BSN, RN

## 2024-05-20 ENCOUNTER — Other Ambulatory Visit: Payer: Self-pay

## 2024-05-20 DIAGNOSIS — Z006 Encounter for examination for normal comparison and control in clinical research program: Secondary | ICD-10-CM

## 2024-05-20 MED ORDER — STUDY - EYEWITNESS (GSK219516) - GSK3515864 (DOLUTEGRAVIR/LAMIVUDINE) 50-300 MG TABLET (PI-VAN DAM): 1.0000 | ORAL_TABLET | Freq: Every day | ORAL | 0 refills | Status: DC

## 2024-05-29 NOTE — Research (Signed)
 Individual seen for research visit Week 72 for Eyewitness, 219516, A Phase 3b, multicenter, single-arm, open-label study evaluating the efficacy, safety, and tolerability of switching to DTG/3TC single tablet regimen administered once daily from a bictegravir/emtricitabine/tenofovir  alafenamide single tablet regimen in people living with HIV of atleast 56 years of age who are virologically suppressed. New consent required. Inform Consent explained/reviewed, risk, benefits, responsibilities and other options were reviewed. Questions answered, comprehension of the study was assessed and adequate time to consider options was provided. Individual verbalized understanding and signed the informed consent witnessed by study coordinator. Signed consent was obtained prior to any procedures being done. Copy provided to participant. Overall individual is doing well. All procedures carried out per protocol. Study medications dispensed. Plan to see participant again in 6 month.

## 2024-07-01 ENCOUNTER — Other Ambulatory Visit: Payer: Self-pay | Admitting: Family

## 2024-07-01 DIAGNOSIS — Z79899 Other long term (current) drug therapy: Secondary | ICD-10-CM

## 2024-07-10 NOTE — Progress Notes (Unsigned)
 [facility] [address] [place]. [place] [address] [phone]       OFFICE FOLLOW UP NOTE  [name] Date of Birth:  12/04/1970 Medical Record Number:  [id number]    Primary neurologist: Dr. Buck Reason for visit: CPAP follow-up    SUBJECTIVE:   CHIEF COMPLAINT:  No chief complaint on file.   Follow-up visit:  Prior visit: 01/16/2024  Brief HPI:   Margaret Porter is a 54 y.o. female with PMH of prediabetes, sickle cell trait, A-flutter, HIV and morbid obesity with BMI of over 45 who was evaluated by Dr. Buck on 09/16/2019 for for concern of underlying sleep apnea with reported snoring, sleep disruption, nonrestorative sleep and daytime tiredness.  ESS 7/24.  HST 11/06/2023 showed severe sleep apnea with total AHI of 62.8/h and O2 nadir of 72% with significant time below 88% over 30 minutes indicating nocturnal hypoxemia.  AutoPap initiated 11/19/2023.     Interval history:  Patient is being seen for initial CPAP compliance visit.  Compliance report shows satisfactory usage with residual AHI 5.4.  Reports she feels like she is sleeping better but still has some difficulty falling asleep and still some daytime fatigue. ESS 7/24 (prior to CPAP 7/24).  Initially using FFM which she had difficulty tolerating, now using nasal pillow which is better. DME [facility].  No questions or concerns today.        ROS:   14 system review of systems performed and negative with exception of those listed in HPI  PMH:  Past Medical History:  Diagnosis Date   Allergic rhinitis    Allergy    Atrial flutter (HCC)    Follows w/ [place] A-fib clinic at [facility]. Patient was taking Metoprolol  daily. Per patient, she was told by the A-fib clinic that she no longer needed to take daily if she was not having any palpitations.   CKD stage 3a, GFR 45-59 ml/min (HCC)    Follows w/ PCP @ [facility].   Dysrhythmia    Family history of adverse  reaction to anesthesia    Patient states that her mother has difficulty waking up from anesthesia.   Food allergy    Hepatic steatosis    Follows w/ [facility]. Patient also w/ hepatic fibrosis, see 12/08/23 Fibroscan Liver Exam in Care Everywhere.   HIV infection (HCC) 2006   Follows w/ [facility] for Infectious disease.   Idiopathic urticaria    Joint pain    Lytic bone lesions on xray 2020   bone lesions on hip,sternum and spine, not related to a malignancy per pt   Mixed hyperlipidemia    Follows w/ PCP @ [name].   Obesity    Pre-diabetes    pt not on meds   Rosacea    Sickle cell trait (HCC)    Sleep apnea    11/06/23 Home sleep study showed severe obstructive sleep apnea.   SVT (supraventricular tachycardia) (HCC) 08/19/2023   Patient was having palpitations. EMS had concern for SVT. Two rounds of adenosine given. Patient had episode of asytole and syncope. Patient responded spontaneously. No CPR or ALS needed.   Swelling of both lower extremities    per pt, left side is more swollen   Vitamin D  deficiency    Wears glasses     PSH:  Past Surgical History:  Procedure Laterality Date   COLONOSCOPY  03/09/2019   diverticulosis sigmoid, int hemorrhoids, repeat 10  years.  Dr. Gustav Mcgee   DILATATION & CURETTAGE/HYSTEROSCOPY WITH MYOSURE N/A 12/13/2023   Procedure: DILATATION & CURETTAGE/HYSTEROSCOPY WITH MYOSURE;  Surgeon: Rutherford Gain, MD;  Location[facility];  Service: Gynecology;  Laterality: N/A;   DILATATION & CURRETTAGE/HYSTEROSCOPY WITH RESECTOCOPE N/A 06/27/2015   Procedure: DILATATION & CURETTAGE/HYSTEROSCOPY WITH RESECTOCOPE;  Surgeon: Gain Rutherford, MD;  Location: [facility];  Service: Gynecology;  Laterality: N/A;   DOPPLER ECHOCARDIOGRAPHY  09/05/2023   EF 60 - 65%   LAPAROSCOPIC APPENDECTOMY N/A 09/07/2017   Procedure: APPENDECTOMY LAPAROSCOPIC;  Surgeon: Signe Mitzie LABOR, MD;   Location: [facility];  Service: General;  Laterality: N/A;    Social History:  Social History   Socioeconomic History   Marital status: Single    Spouse name: Not on file   Number of children: Not on file   Years of education: Not on file   Highest education level: Not on file  Occupational History   Occupation: Runner, broadcasting/film/video and coach  Tobacco Use   Smoking status: Never   Smokeless tobacco: Never   Tobacco comments:    Never smoked 11/27/23  Vaping Use   Vaping status: Never Used  Substance and Sexual Activity   Alcohol use: Yes    Comment: occasional (special occasions) about twice per month   Drug use: No   Sexual activity: Not on file  Other Topics Concern   Not on file  Social History Narrative   Teacher, 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   Single, no children   Exercise - walking   01/2024   Pt lives alone    Social Drivers of Health   Financial Resource Strain: Low Risk  (01/28/2024)   Overall Financial Resource Strain (CARDIA)    Difficulty of Paying Living Expenses: Not very hard  Food Insecurity: No Food Insecurity (01/28/2024)   Hunger Vital Sign    Worried About Running Out of Food in the Last Year: Never true    Ran Out of Food in the Last Year: Never true  Transportation Needs: No Transportation Needs (01/28/2024)   PRAPARE - Administrator, Civil Service (Medical): No    Lack of Transportation (Non-Medical): No  Physical Activity: Insufficiently Active (01/28/2024)   Exercise Vital Sign    Days of Exercise per Week: 2 days    Minutes of Exercise per Session: 30 min  Stress: No Stress Concern Present (01/28/2024)   [employer] of Occupational Health - Occupational Stress Questionnaire    Feeling of Stress : Not at all  Social Connections: Unknown (01/28/2024)   Social Connection and Isolation Panel    Frequency of Communication with Friends and Family: More than three times a week    Frequency of Social Gatherings with Friends  and Family: Twice a week    Attends Religious Services: Patient unable to answer    Active Member of Clubs or Organizations: Patient unable to answer    Attends Banker Meetings: 1 to 4 times per year    Marital Status: Never married  Intimate Partner Violence: Not At Risk (01/28/2024)   Humiliation, Afraid, Rape, and Kick questionnaire    Fear of Current or Ex-Partner: No    Emotionally Abused: No    Physically Abused: No    Sexually Abused: No    Family History:  Family History  Problem Relation Age of Onset   Heart disease Mother        cardiomyopathy related to covid infection  Diabetes Mother    Heart disease Father        CAD, considering pacemaker 2023   Diabetes Father    High blood pressure Father    Arrhythmia Father    Diabetes Brother    Heart disease Paternal Uncle    Heart disease Maternal Grandmother    Cancer Maternal Grandmother        breast   Heart disease Paternal Grandmother    Cancer Paternal Grandmother        breast   Colon cancer Neg Hx    Colon polyps Neg Hx    Esophageal cancer Neg Hx    Rectal cancer Neg Hx    Stomach cancer Neg Hx    Sleep apnea Neg Hx     Medications:   Current Outpatient Medications on File Prior to Visit  Medication Sig Dispense Refill   Cholecalciferol (VITAMIN D ) 50 MCG (2000 UT) tablet Take 8,000 Units by mouth daily.     diphenhydrAMINE  (BENADRYL ) 25 MG tablet Take 25 mg by mouth every 6 (six) hours as needed for itching or allergies. Reported on 02/29/2016     metoprolol  succinate (TOPROL  XL) 25 MG 24 hr tablet Take 0.5 tablets (12.5 mg total) by mouth at bedtime.     Pitavastatin  Calcium  4 MG TABS TAKE 1 TABLET BY MOUTH EVERY DAY 30 tablet 5   Study - EYEWITNESS ([id number]) - [id number] (dolutegravir /lamivudine ) 50-300 mg tablet (PI-[place]) Take 1 tablet by mouth daily. 180 tablet 0   topiramate  (TOPAMAX ) 25 MG capsule Take 25 mg by mouth 2 (two) times daily.     No current facility-administered  medications on file prior to visit.    Allergies:   Allergies  Allergen Reactions   Molds & Smuts     SOB, chest congestion   Oxycodone      Questionable mouth itching, sweats, but was also simultaneously on Cipro , Flagyl , and Robaxin .   Wellbutrin  [Bupropion ] Other (See Comments)    Eyes swelled 3 hours after taking one dose   Red Dye #40 (Allura Red)     Caused a rash with large amounts   Amoxicillin Rash   Aspirin Anxiety   Sulfamethoxazole-Trimethoprim Rash      OBJECTIVE:  Physical Exam  There were no vitals filed for this visit.  There is no height or weight on file to calculate BMI. No results found.   General: well developed, well nourished, very pleasant middle-age female, seated, in no evident distress Head: head normocephalic and atraumatic.   Neck: supple with no carotid or supraclavicular bruits Cardiovascular: regular rate and rhythm, no murmurs Musculoskeletal: no deformity Skin:  no rash/petichiae Vascular:  Normal pulses all extremities   Neurologic Exam Mental Status: Awake and fully alert. Oriented to place and time. Recent and remote memory intact. Attention span, concentration and fund of knowledge appropriate. Mood and affect appropriate.  Cranial Nerves: Pupils equal, briskly reactive to light. Extraocular movements full without nystagmus. Visual fields full to confrontation. Hearing intact. Facial sensation intact. Face, tongue, palate moves normally and symmetrically.  Motor: Normal bulk and tone. Normal strength in all tested extremity muscles Gait and Station: Arises from chair without difficulty. Stance is normal. Gait demonstrates normal stride length and balance without use of AD.         ASSESSMENT/PLAN: Margaret Porter is a 54 y.o. year old female    Severe OSA on CPAP :  Nocturnal hypoxemia:  Compliance report shows satisfactory usage with residual AHI 5.2.  As she continues to experience daytime fatigue, will slightly  adjust pressure setting from 7-14 to 7-15 and recommend completion of ONO to ensure resolution of nocturnal hypoxemia on PAP therapy.  Also discussed improving sleep duration (average sleep 5hrs and ) as this could also be contributing to continued daytime fatigue.   Discussed continued nightly usage with ensuring greater than 4 hours nightly for optimal benefit and per insurance purposes.   Continue to follow with DME company for any needed supplies or CPAP related concerns     Follow up in 6 months or call earlier if needed   CC:  PCP: Tysinger, Alm RAMAN, PA-C    I personally spent a total of *** minutes in the care of the patient today including {Time Based Coding:[id number]}.   Harlene Bogaert, AGNP-BC  [facility] [address] [place], [place] [address]  Phone [phone] Fax [phone] Note: This document was prepared with digital dictation and possible smart phrase technology. Any transcriptional errors that result from this process are unintentional.

## 2024-07-13 ENCOUNTER — Encounter: Payer: Self-pay | Admitting: Adult Health

## 2024-07-13 ENCOUNTER — Ambulatory Visit: Admitting: Adult Health

## 2024-07-13 VITALS — BP 110/72 | HR 70 | Ht 63.0 in | Wt 289.6 lb

## 2024-07-13 DIAGNOSIS — G4733 Obstructive sleep apnea (adult) (pediatric): Secondary | ICD-10-CM

## 2024-07-13 DIAGNOSIS — G4734 Idiopathic sleep related nonobstructive alveolar hypoventilation: Secondary | ICD-10-CM

## 2024-07-13 NOTE — Patient Instructions (Addendum)
 Your Plan:  Please ensure you are using CPAP nightly with greater than 4 hours per night for optimal benefit and per insurance requirements  Will place new order to complete overnight oximetry testing while on CPAP to ensure resolution of nocturnal hypoxemia on CPAP therapy   Continue to follow with your DME company for any needed supplies and CPAP related concerns.  Please be sure to change your filter every month, your mask about every 3 months, hose about every 6 months, humidifier chamber about yearly. Some restrictions are imposed by your insurance carrier with regard to how frequently you can get certain supplies. Your DME company can provide further details if necessary.       Follow-up in 1 year or call earlier if needed      Thank you for coming to see us  at [facility]. I hope we have been able to provide you high quality care today.  You may receive a patient satisfaction survey over the next few weeks. We would appreciate your feedback and comments so that we may continue to improve ourselves and the health of our patients.

## 2024-07-16 NOTE — Progress Notes (Signed)
 Millissa Deese D, CMA  Zott, Gasper Ona, Tammy; Darrel Boyer New orders have been placed for the above pt, DOB: 05/12/1970 Thanks

## 2024-07-21 ENCOUNTER — Ambulatory Visit: Payer: 59 | Admitting: Adult Health

## 2024-08-06 ENCOUNTER — Encounter: Payer: Self-pay | Admitting: Family

## 2024-08-06 ENCOUNTER — Encounter

## 2024-08-06 ENCOUNTER — Other Ambulatory Visit: Payer: Self-pay

## 2024-08-06 ENCOUNTER — Ambulatory Visit: Payer: 59 | Admitting: Family

## 2024-08-06 VITALS — BP 122/83 | HR 72 | Temp 97.7°F | Ht 63.0 in | Wt 290.0 lb

## 2024-08-06 DIAGNOSIS — B20 Human immunodeficiency virus [HIV] disease: Secondary | ICD-10-CM | POA: Diagnosis not present

## 2024-08-06 DIAGNOSIS — Z0000 Encounter for general adult medical examination without abnormal findings: Secondary | ICD-10-CM

## 2024-08-06 NOTE — Patient Instructions (Addendum)
 Nice to see you.  Continue to take your medication daily as prescribed.  Plan for follow up in 6 months or sooner if needed with lab work on the same day.  Have a great day and stay safe!

## 2024-08-06 NOTE — Assessment & Plan Note (Signed)
 Not currently sexually active. Vaccinations reviewed and has received first dose of Shingrix with appointment for second dose. Routine dental care up-to-date. Breast cancer and cervical cancer screenings up-to-date.

## 2024-08-06 NOTE — Assessment & Plan Note (Signed)
 Margaret Porter continues to have well-controlled virus with good adherence and tolerance to study medication.  Will obtain lab work from study visits.  Discussed plan of care and U equals U.  Social determinants of health reviewed with no interventions indicated.  Continue medication per study protocol.  Plan for follow-up in 6 months or sooner if needed with lab work on the same day.

## 2024-08-06 NOTE — Progress Notes (Signed)
 Brief Narrative   Patient ID: Margaret Porter, female    DOB: 10/25/1970, 54 y.o.   MRN: [id number]  Margaret Porter is a 54 y/o AA female diagnosed with HIV disease in August 2005 with risk factor of heterosexual contact. Initial viral load was 54,800 with CD4 count 19. Entered care at [facility] Stage 3. No genotypes available for review. No history of opportunistic infection. ART experienced with Atripla, Stribild, Genvoya , and Biktarvy, Currently enrolled in research study Eyewitness [phone].   Subjective:   Chief Complaint  Patient presents with   Follow-up    B20    HPI:  Margaret Porter is a 54 y.o. female with HIV disease last seen on 01/30/2024 with well-controlled virus and good adherence and tolerance to study medication through Ey[employer]itness [id number] on either Dovato  or Biktarvy. Previous viral load undetectable. Here today for follow up.   Margaret Porter has been doing well since her last office visit and continues to take study medication as prescribed with no adverse side effects or problems obtaining medication.  No new concerns/complaints.  Recently went on a cruise with her godson.  Has been, transportation, and access to food are stable.  Not currently sexually active.  Routine dental care up-to-date.  Healthcare maintenance reviewed.  Denies fevers, chills, night sweats, headaches, changes in vision, neck pain/stiffness, nausea, diarrhea, vomiting, lesions or rashes.  Lab Results  Component Value Date   CD4TCELL 45 10/31/2022   CD4TABS 558 10/31/2022   Lab Results  Component Value Date   HIV1RNAQUANT Not Detected 10/31/2022     Allergies  Allergen Reactions   Molds & Smuts     SOB, chest congestion   Oxycodone      Questionable mouth itching, sweats, but was also simultaneously on Cipro , Flagyl , and Robaxin .   Wellbutrin  [Bupropion ] Other (See Comments)    Eyes swelled 3 hours after taking one dose   Red Dye #40 (Allura Red)     Caused a rash with large amounts    Amoxicillin Rash   Aspirin Anxiety   Sulfamethoxazole-Trimethoprim Rash      Outpatient Medications Prior to Visit  Medication Sig Dispense Refill   Cholecalciferol (VITAMIN D ) 50 MCG (2000 UT) tablet Take 8,000 Units by mouth daily.     diphenhydrAMINE  (BENADRYL ) 25 MG tablet Take 25 mg by mouth every 6 (six) hours as needed for itching or allergies. Reported on 02/29/2016     furosemide (LASIX) 40 MG tablet Take 40 mg by mouth daily as needed.     metFORMIN  (GLUCOPHAGE -XR) 500 MG 24 hr tablet Take 500 mg by mouth every evening.     metoprolol  succinate (TOPROL  XL) 25 MG 24 hr tablet Take 0.5 tablets (12.5 mg total) by mouth at bedtime.     Pitavastatin  Calcium  4 MG TABS TAKE 1 TABLET BY MOUTH EVERY DAY 30 tablet 5   Study - EYEWITNESS (HDX780483) - [id number] (dolutegravir /lamivudine ) 50-300 mg tablet ([place]) Take 1 tablet by mouth daily. 180 tablet 0   topiramate  (TOPAMAX ) 25 MG capsule Take 25 mg by mouth 2 (two) times daily. (Patient not taking: Reported on 08/06/2024)     No facility-administered medications prior to visit.     Past Medical History:  Diagnosis Date   Allergic rhinitis    Allergy    Atrial flutter (HCC)    Follows w/ [place] [facility]. Patient was taking Metoprolol  daily. Per patient, she was told by the A-fib clinic that she no longer needed  to take daily if she was not having any palpitations.   CKD stage 3a, GFR 45-59 ml/min (HCC)    Follows w/ PCP @ [facility].   Dysrhythmia    Family history of adverse reaction to anesthesia    Patient states that her mother has difficulty waking up from anesthesia.   Food allergy    Hepatic steatosis    Follows w/ [facility]. Patient also w/ hepatic fibrosis, see 12/08/23 [facility] Liver Exam in Care Everywhere.   HIV infection (HCC) 2006   Follows w/ [facility].   Idiopathic urticaria    Joint pain    Lytic bone lesions on  xray 2020   bone lesions on hip,sternum and spine, not related to a malignancy per pt   Mixed hyperlipidemia    Follows w/ PCP @ [name].   Obesity    Pre-diabetes    pt not on meds   Rosacea    Sickle cell trait (HCC)    Sleep apnea    11/06/23 Home sleep study showed severe obstructive sleep apnea.   SVT (supraventricular tachycardia) (HCC) 08/19/2023   Patient was having palpitations. EMS had concern for SVT. Two rounds of adenosine given. Patient had episode of asytole and syncope. Patient responded spontaneously. No CPR or ALS needed.   Swelling of both lower extremities    per pt, left side is more swollen   Vitamin D  deficiency    Wears glasses      Past Surgical History:  Procedure Laterality Date   COLONOSCOPY  03/09/2019   diverticulosis sigmoid, int hemorrhoids, repeat 10 years.  Dr. Gustav Mcgee   DILATATION & CURETTAGE/HYSTEROSCOPY WITH MYOSURE N/A 12/13/2023   Procedure: DILATATION & CURETTAGE/HYSTEROSCOPY WITH MYOSURE;  Surgeon: Rutherford Gain, MD;  Location: [facility];  Service: Gynecology;  Laterality: N/A;   DILATATION & CURRETTAGE/HYSTEROSCOPY WITH RESECTOCOPE N/A 06/27/2015   Procedure: DILATATION & CURETTAGE/HYSTEROSCOPY WITH RESECTOCOPE;  Surgeon: Gain Rutherford, MD;  Location: [facility];  Service: Gynecology;  Laterality: N/A;   DOPPLER ECHOCARDIOGRAPHY  09/05/2023   EF 60 - 65%   LAPAROSCOPIC APPENDECTOMY N/A 09/07/2017   Procedure: APPENDECTOMY LAPAROSCOPIC;  Surgeon: Signe Mitzie LABOR, MD;  Location: [facility];  Service: General;  Laterality: N/A;        Review of Systems  Constitutional:  Negative for appetite change, chills, diaphoresis, fatigue, fever and unexpected weight change.  Eyes:        Negative for acute change in vision  Respiratory:  Negative for chest tightness, shortness of breath and wheezing.   Cardiovascular:  Negative for chest pain.  Gastrointestinal:  Negative for diarrhea, nausea and vomiting.   Genitourinary:  Negative for dysuria, pelvic pain and vaginal discharge.  Musculoskeletal:  Negative for neck pain and neck stiffness.  Skin:  Negative for rash.  Neurological:  Negative for seizures, syncope, weakness and headaches.  Hematological:  Negative for adenopathy. Does not bruise/bleed easily.  Psychiatric/Behavioral:  Negative for hallucinations.      Objective:   BP 122/83   Pulse 72   Temp 97.7 F (36.5 C) (Temporal)   Ht 5' 3 (1.6 m)   Wt 290 lb (131.5 kg)   LMP  (LMP Unknown)   SpO2 96%   BMI 51.37 kg/m  Nursing note and vital signs reviewed.  Physical Exam Constitutional:      General: She is not in acute distress.    Appearance: She is well-developed.  Eyes:  Conjunctiva/sclera: Conjunctivae normal.  Cardiovascular:     Rate and Rhythm: Normal rate and regular rhythm.     Heart sounds: Normal heart sounds. No murmur heard.    No friction rub. No gallop.  Pulmonary:     Effort: Pulmonary effort is normal. No respiratory distress.     Breath sounds: Normal breath sounds. No wheezing or rales.  Chest:     Chest wall: No tenderness.  Abdominal:     General: Bowel sounds are normal.     Palpations: Abdomen is soft.     Tenderness: There is no abdominal tenderness.  Musculoskeletal:     Cervical back: Neck supple.  Lymphadenopathy:     Cervical: No cervical adenopathy.  Skin:    General: Skin is warm and dry.     Findings: No rash.  Neurological:     Mental Status: She is alert and oriented to person, place, and time.  Psychiatric:        Behavior: Behavior normal.        Thought Content: Thought content normal.        Judgment: Judgment normal.          08/06/2024   10:50 AM 01/28/2024    8:32 AM 01/23/2023    8:22 AM 12/12/2022    8:30 AM 11/14/2022   11:22 AM  Depression screen PHQ 2/9  Decreased Interest 0 0 0 0 0  Down, Depressed, Hopeless 0 0 0 0 0  PHQ - 2 Score 0 0 0 0 0  Altered sleeping 1      Tired, decreased energy 0       Change in appetite 0      Feeling bad or failure about yourself  0      Trouble concentrating 0      Moving slowly or fidgety/restless 0      Suicidal thoughts 0      PHQ-9 Score 1      Difficult doing work/chores Not difficult at all            08/06/2024   10:50 AM  GAD 7 : Generalized Anxiety Score  Nervous, Anxious, on Edge 0  Control/stop worrying 0  Worry too much - different things 0  Trouble relaxing 0  Restless 0  Easily annoyed or irritable 0  Afraid - awful might happen 0  Total GAD 7 Score 0  Anxiety Difficulty Not difficult at all     The ASCVD Risk score (Arnett DK, et al., 2019) failed to calculate for the following reasons:   The valid total cholesterol range is 130 to 320 mg/dL      Assessment & Plan:    Patient Active Problem List   Diagnosis Date Noted   Healthcare maintenance 01/30/2024   Encounter for health maintenance examination in adult 01/28/2024   Screening for colon cancer 01/28/2024   Impaired fasting blood sugar 01/28/2024   Screening for hematuria or proteinuria 01/28/2024   Elevated serum creatinine 01/28/2024   Typical atrial flutter (HCC) 08/27/2023   BMI 45.0-49.9, adult [facility]) Current BMI 48.5 07/10/2023   High risk medication use 11/14/2022   Polyphagia 03/09/2021   Hepatic steatosis 02/14/2021   Venous insufficiency 01/13/2021   Seasonal allergies 05/16/2020   Estrogen deficiency 01/11/2020   Perimenopausal 01/11/2020   Bony sclerosis 02/03/2019   S/P appendectomy 01/20/2019   Vaccine counseling 01/20/2019   Insulin  resistance 12/09/2018   Routine general medical examination at a health care facility 12/22/2015   Vitamin  D deficiency 12/22/2015   Need for pneumococcal vaccination 12/22/2015   Rosacea 04/22/2012   URINARY INCONTINENCE, URGE 10/24/2010   Other hyperlipidemia 11/06/2007   URTICARIA, IDIOPATHIC 03/06/2007   Human immunodeficiency virus (HIV) disease (HCC) 01/24/2007     Problem List Items Addressed This  Visit       Other   Human immunodeficiency virus (HIV) disease (HCC) - Primary   Margaret Porter continues to have well-controlled virus with good adherence and tolerance to study medication.  Will obtain lab work from study visits.  Discussed plan of care and U equals U.  Social determinants of health reviewed with no interventions indicated.  Continue medication per study protocol.  Plan for follow-up in 6 months or sooner if needed with lab work on the same day.      Healthcare maintenance   Not currently sexually active. Vaccinations reviewed and has received first dose of Shingrix with appointment for second dose. Routine dental care up-to-date. Breast cancer and cervical cancer screenings up-to-date.        I am having Margaret Porter maintain her diphenhydrAMINE , Vitamin D , topiramate , metoprolol  succinate, EYEWITNESS ([id number]) [id number], Pitavastatin  Calcium , furosemide, and metFORMIN .    Follow-up: Return in about 6 months (around 02/06/2025). or sooner if needed.    Cathlyn July, MSN, FNP-C Nurse Practitioner [facility] Main number: [phone]

## 2024-08-19 ENCOUNTER — Telehealth: Payer: Self-pay | Admitting: Neurology

## 2024-08-19 NOTE — Telephone Encounter (Signed)
 Received the ONO report completes while the patient uses CPAP.  Patient completed August 19-20.  There was a total of 6 hours 1 minute and 15 seconds that was recorded.  The lowest the oxygen level dropped was 87% there was a recorded total of 2 min where the oxygen was below 89%. Based off this testing, there would be no need for oxygen to be added to CPAP. Will advise Jessica NP so she can review for the pt.

## 2024-08-20 NOTE — Telephone Encounter (Signed)
 Agree with ONO results. No supplemental oxygen required and nocturnal hypoxemia resolved with CPAP therapy. Please advise patient, thank you.

## 2024-09-07 ENCOUNTER — Ambulatory Visit: Admitting: Nurse Practitioner

## 2024-09-07 ENCOUNTER — Ambulatory Visit: Payer: Self-pay

## 2024-09-07 ENCOUNTER — Encounter: Payer: Self-pay | Admitting: Nurse Practitioner

## 2024-09-07 VITALS — BP 128/82 | HR 92 | Temp 99.3°F | Wt 292.2 lb

## 2024-09-07 DIAGNOSIS — J029 Acute pharyngitis, unspecified: Secondary | ICD-10-CM

## 2024-09-07 DIAGNOSIS — U071 COVID-19: Secondary | ICD-10-CM | POA: Insufficient documentation

## 2024-09-07 DIAGNOSIS — R059 Cough, unspecified: Secondary | ICD-10-CM | POA: Diagnosis not present

## 2024-09-07 DIAGNOSIS — R509 Fever, unspecified: Secondary | ICD-10-CM | POA: Diagnosis not present

## 2024-09-07 LAB — POCT INFLUENZA A/B
Influenza A, POC: NEGATIVE
Influenza B, POC: NEGATIVE

## 2024-09-07 LAB — POCT RAPID STREP A (OFFICE): Rapid Strep A Screen: NEGATIVE

## 2024-09-07 LAB — POC COVID19 BINAXNOW: SARS Coronavirus 2 Ag: POSITIVE — AB

## 2024-09-07 MED ORDER — NIRMATRELVIR/RITONAVIR (PAXLOVID)TABLET: 3.0000 | ORAL_TABLET | Freq: Two times a day (BID) | ORAL | 0 refills | Status: AC

## 2024-09-07 MED ORDER — PROMETHAZINE-DM 6.25-15 MG/5ML PO SYRP: 5.0000 mL | ORAL_SOLUTION | Freq: Three times a day (TID) | ORAL | 0 refills | Status: DC | PRN

## 2024-09-07 MED ORDER — DM-GUAIFENESIN ER 30-600 MG PO TB12: 1.0000 | ORAL_TABLET | Freq: Two times a day (BID) | ORAL | 1 refills | Status: DC

## 2024-09-07 NOTE — Patient Instructions (Signed)
 VISIT SUMMARY:  You were seen today for symptoms related to a COVID-19 infection, including fever, cough, sore throat, and mild shortness of breath. Your symptoms began on Saturday and have included fever, cough with some sputum, sore throat, and mild headache. A COVID-19 test confirmed the infection.  YOUR PLAN:  -COVID-19 INFECTION: COVID-19 is a viral infection that can cause symptoms like fever, cough, sore throat, and shortness of breath. You have been prescribed cough syrup with codeine or promethazine  to help you sleep at night. For fever and sore throat relief, you should alternate between acetaminophen  and ibuprofen . Continue taking guaifenesin , two pills twice a day, to help with congestion. Make sure you are fever-free for 24 hours without medication before resuming normal activities. When you return to work, wear a mask for five days after your last fever. You declined the option of taking Paxlovid  due to its uncertain benefits. Drinking hot tea is also recommended to help open your sinuses and relieve pressure.  INSTRUCTIONS:  Please follow the medication instructions provided and ensure you are fever-free for 24 hours without medication before resuming normal activities. When you return to work, wear a mask for five days after your last fever. If your symptoms worsen or you have any concerns, please contact our office.

## 2024-09-07 NOTE — Assessment & Plan Note (Signed)
 COVID-19 infection confirmed by positive swab test. Symptoms began on Saturday with fever, cough, sore throat, and mild shortness of breath. Current symptoms include fever, cough with expectoration, sore throat, and mild headache. The current strain primarily affects the sinuses with less severe symptoms compared to previous strains. Paxlovid 's efficacy with this strain is uncertain, and some patients report minimal benefit. - Prescribe cough syrup with codeine or promethazine  for nighttime use to aid sleep. - Recommend alternating acetaminophen  and ibuprofen  for fever and sore throat relief. - Advise taking guaifenesin , two pills twice a day, to relieve congestion. - Instruct to remain fever-free for 24 hours without medication before resuming normal activities. - Advise wearing a mask for five days after the last fever when returning to work. - Discuss the option of prescribing Paxlovid , but she declined due to uncertain benefits. - Encourage drinking hot tea to help open sinuses and relieve pressure.

## 2024-09-07 NOTE — Telephone Encounter (Signed)
 FYI Only or Action Required?: FYI only for provider.  Patient was last seen in primary care on 01/28/2024 by Margaret Alm RAMAN, PA-C.  Called Nurse Triage reporting Sore Throat.  Symptoms began several days ago.  Interventions attempted: OTC medications: tylenol .  Symptoms are: gradually worsening.  Triage Disposition: See PCP When Office is Open (Within 3 Days)  Patient/caregiver understands and will follow disposition?: Yes  Copied from CRM [id number]. Topic: Clinical - Red Word Triage >> Sep 07, 2024  8:09 AM Franky GRADE wrote: Red Word that prompted transfer to Nurse Triage: Patient is experiencing what she believes to be covid symptoms, sore throat, fever of 100, and congestion. Reason for Disposition  [1] Sore throat is the only symptom AND [2] present > 48 hours  Answer Assessment - Initial Assessment Questions 1. ONSET: When did the throat start hurting? (Hours or days ago)      Started 2 days  2. SEVERITY: How bad is the sore throat? (Scale 1-10; mild, moderate or severe)     Moderate  3. STREP EXPOSURE: Has there been any exposure to strep within the past week? If Yes, ask: What type of contact occurred?      Not that she is aware of  4.  VIRAL SYMPTOMS: Are there any symptoms of a cold, such as a runny nose, cough, hoarse voice or red eyes?      Runny nose yesterday  5. FEVER: Do you have a fever? If Yes, ask: What is your temperature, how was it measured, and when did it start?     Yes, low grade fever, 100F  6. PUS ON THE TONSILS: Is there pus on the tonsils in the back of your throat?     No  7. OTHER SYMPTOMS: Do you have any other symptoms? (e.g., difficulty breathing, headache, rash)     Congestion, headache  8. PREGNANCY: Is there any chance you are pregnant? When was your last menstrual period?     no  Protocols used: Sore Throat-A-AH

## 2024-09-07 NOTE — Progress Notes (Signed)
 Camie FORBES Doing, DNP, AGNP-c [facility] [address] [place], [place] [address] [phone]   ACUTE VISIT on 09/07/2024  Blood pressure 128/82, pulse 92, temperature 99.3 F (37.4 C), weight 292 lb 3.2 oz (132.5 kg), SpO2 97%.  Subjective:  HPI  History of Present Illness Margaret Porter is a 54 year old female who presents with COVID-19 symptoms.  Symptoms began on Saturday with nausea and mild shortness of breath at night. By Sunday morning, she experienced fever and took a shower to alleviate the heat sensation. She reports a sore throat that initially subsided but returned on Monday morning. She also has a mild cough that produces some sputum.  She has been using Mucinex  at home, taking two pills twice a day, which she had from a previous illness.  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  ROS negative except for what is listed in HPI. History, Medications, Surgery, SDOH, and Family History reviewed and updated as appropriate.  Objective:  Physical Exam Vitals and nursing note reviewed.  Constitutional:      Appearance: She is ill-appearing.  HENT:     Nose: Congestion present.  Eyes:     Pupils: Pupils are equal, round, and reactive to light.  Cardiovascular:     Rate and Rhythm: Normal rate and regular rhythm.     Pulses: Normal pulses.     Heart sounds: Normal heart sounds.  Pulmonary:     Effort: Pulmonary effort is normal.     Breath sounds: Normal breath sounds.  Lymphadenopathy:     Cervical: No cervical adenopathy.  Skin:    General: Skin is warm and dry.     Capillary Refill: Capillary refill takes less than 2 seconds.  Neurological:     Mental Status: She is alert and oriented to person, place, and time.  Psychiatric:        Mood and Affect: Mood normal.         Assessment & Plan:   Problem List Items Addressed This Visit     COVID - Primary   COVID-19 infection confirmed by positive swab  test. Symptoms began on Saturday with fever, cough, sore throat, and mild shortness of breath. Current symptoms include fever, cough with expectoration, sore throat, and mild headache. The current strain primarily affects the sinuses with less severe symptoms compared to previous strains. Paxlovid 's efficacy with this strain is uncertain, and some patients report minimal benefit. - Prescribe cough syrup with codeine or promethazine  for nighttime use to aid sleep. - Recommend alternating acetaminophen  and ibuprofen  for fever and sore throat relief. - Advise taking guaifenesin , two pills twice a day, to relieve congestion. - Instruct to remain fever-free for 24 hours without medication before resuming normal activities. - Advise wearing a mask for five days after the last fever when returning to work. - Discuss the option of prescribing Paxlovid , but she declined due to uncertain benefits. - Encourage drinking hot tea to help open sinuses and relieve pressure.      Relevant Medications   nirmatrelvir /ritonavir  (PAXLOVID ) 20 x 150 MG & 10 x 100MG  TABS   promethazine -dextromethorphan (PROMETHAZINE -DM) 6.25-15 MG/5ML syrup   dextromethorphan-guaiFENesin  (MUCINEX  DM) 30-600 MG 12hr tablet   Other Visit Diagnoses       Fever, unspecified fever cause       Relevant Orders   POC COVID-19 BinaxNow (Completed)     Cough, unspecified type       Relevant Orders  Influenza A/B (Completed)     Sore throat       Relevant Orders   Rapid Strep A (Completed)      Camie FORBES Doing, DNP, AGNP-c

## 2024-09-10 ENCOUNTER — Encounter: Payer: Self-pay | Admitting: Adult Health

## 2024-11-03 ENCOUNTER — Other Ambulatory Visit (HOSPITAL_COMMUNITY): Payer: Self-pay

## 2024-11-03 ENCOUNTER — Telehealth: Payer: Self-pay

## 2024-11-03 NOTE — Telephone Encounter (Signed)
 Pharmacy Patient Advocate Encounter  Insurance verification completed.   The patient is insured through [facility]   Ran test claim for Dovato . Currently a quantity of 30 is a 30 day supply and the co-pay is $0.00 . Patient will have to use [facility]   This test claim was processed through [facility]- copay amounts may vary at other pharmacies due to pharmacy/plan contracts, or as the patient moves through the different stages of their insurance plan.

## 2024-11-04 ENCOUNTER — Other Ambulatory Visit: Payer: Self-pay

## 2024-11-04 ENCOUNTER — Other Ambulatory Visit: Payer: Self-pay | Admitting: Pharmacist

## 2024-11-04 ENCOUNTER — Encounter

## 2024-11-04 VITALS — BP 128/84 | HR 67 | Wt 293.7 lb

## 2024-11-04 DIAGNOSIS — Z006 Encounter for examination for normal comparison and control in clinical research program: Secondary | ICD-10-CM

## 2024-11-04 DIAGNOSIS — B20 Human immunodeficiency virus [HIV] disease: Secondary | ICD-10-CM

## 2024-11-04 MED ORDER — DOVATO 50-300 MG PO TABS: 1.0000 | ORAL_TABLET | Freq: Every day | ORAL | 3 refills | Status: DC

## 2024-11-04 MED ORDER — DOVATO 50-300 MG PO TABS: 1.0000 | ORAL_TABLET | Freq: Every day | ORAL | Status: DC

## 2024-11-04 NOTE — Progress Notes (Signed)
 Medication Samples have been provided to the patient.  Drug name: Dovato         Strength: 50/300 mg         Qty: 14 tablets (1 bottle)   LOT: [id number]   Exp.Date: 02/2026  Dosing instructions: Take one tablet by mouth once daily  The patient has been instructed regarding the correct time, dose, and frequency of taking this medication, including desired effects and most common side effects.   Moniqua Engebretsen L. Elroy Schembri, PharmD, BCIDP, AAHIVP, CPP Clinical Pharmacist Practitioner Infectious Diseases Clinical Pharmacist [facility]

## 2024-11-04 NOTE — Research (Signed)
 Individual seen for research visit Week 96 for Eyewitness, [id number], A Phase 3b, multicenter, single-arm, open-label study evaluating the efficacy, safety, and tolerability of switching to DTG/3TC single tablet regimen administered once daily from a bictegravir/emtricitabine/tenofovir  alafenamide single tablet regimen in people living with HIV of atleast 56 years of age who are virologically suppressed.Overall individual is doing well. All procedures carried out per protocol.   This was participant's final visit for Eyewitness study. She desires to remain on Dovato . Insurance appears to cover Dovato .  A  week sample provided today and order was placed for Dovato  to cover her until next [facility] appt.. Ppt. Will follow up with [facility] provider in Feb 2026. No ongoing AEs that are related to study product. Last dose of study medication was 04Nov2025.

## 2024-11-30 ENCOUNTER — Other Ambulatory Visit: Payer: Self-pay

## 2024-11-30 DIAGNOSIS — B20 Human immunodeficiency virus [HIV] disease: Secondary | ICD-10-CM

## 2024-11-30 MED ORDER — DOVATO 50-300 MG PO TABS: 1.0000 | ORAL_TABLET | Freq: Every day | ORAL | 3 refills | Status: AC

## 2024-11-30 NOTE — Progress Notes (Signed)
 Received call from [facility], patient prefers mail order. Refills sent to mail order pharmacy.   Vong Garringer, BSN, RN

## 2024-12-01 ENCOUNTER — Encounter

## 2024-12-01 ENCOUNTER — Other Ambulatory Visit: Payer: Self-pay

## 2024-12-01 DIAGNOSIS — Z006 Encounter for examination for normal comparison and control in clinical research program: Secondary | ICD-10-CM

## 2024-12-01 NOTE — Research (Signed)
 Individual seen for Unscheduled research visit for [employer], [phone], A Phase 3b, multicenter, single-arm, open-label study evaluating the efficacy, safety, and tolerability of switching to DTG/3TC single tablet regimen administered once daily from a bictegravir/emtricitabine/tenofovir  alafenamide single tablet regimen in people living with HIV of atleast 56 years of age who are virologically suppressed. New Inform Consent required, explained/reviewed, risk, benefits, responsibilities and other options were reviewed. Questions answered, comprehension of the study was assessed and adequate time to consider options was provided. Individual verbalized understanding and signed the informed consent witnessed by study coordinator. Copy provided to participant. Signed consent was obtained prior to any study procedures being completed. Plan for Fibroscan for 02Dec2025 [id number].

## 2025-01-11 LAB — HM MAMMOGRAPHY

## 2025-01-12 ENCOUNTER — Encounter: Payer: Self-pay | Admitting: Medical

## 2025-01-15 ENCOUNTER — Other Ambulatory Visit: Payer: Self-pay | Admitting: Family

## 2025-01-15 DIAGNOSIS — Z79899 Other long term (current) drug therapy: Secondary | ICD-10-CM

## 2025-01-25 ENCOUNTER — Encounter

## 2025-02-02 ENCOUNTER — Encounter: Payer: Self-pay | Admitting: Medical

## 2025-02-02 ENCOUNTER — Ambulatory Visit: Payer: 59 | Admitting: Medical

## 2025-02-02 VITALS — BP 130/80 | HR 80 | Ht 63.0 in | Wt 292.4 lb

## 2025-02-02 DIAGNOSIS — E88819 Insulin resistance, unspecified: Secondary | ICD-10-CM

## 2025-02-02 DIAGNOSIS — E559 Vitamin D deficiency, unspecified: Secondary | ICD-10-CM | POA: Diagnosis not present

## 2025-02-02 DIAGNOSIS — Z0000 Encounter for general adult medical examination without abnormal findings: Secondary | ICD-10-CM

## 2025-02-02 DIAGNOSIS — I10 Essential (primary) hypertension: Secondary | ICD-10-CM | POA: Insufficient documentation

## 2025-02-02 DIAGNOSIS — R7301 Impaired fasting glucose: Secondary | ICD-10-CM

## 2025-02-02 DIAGNOSIS — B20 Human immunodeficiency virus [HIV] disease: Secondary | ICD-10-CM | POA: Diagnosis not present

## 2025-02-02 DIAGNOSIS — E7849 Other hyperlipidemia: Secondary | ICD-10-CM | POA: Diagnosis not present

## 2025-02-02 LAB — POCT URINALYSIS DIP (PROADVANTAGE DEVICE)
Glucose, UA: NEGATIVE mg/dL
Ketones, POC UA: NEGATIVE mg/dL
Leukocytes, UA: NEGATIVE
Nitrite, UA: NEGATIVE
Protein Ur, POC: NEGATIVE mg/dL
Specific Gravity, Urine: 1.02
Urobilinogen, Ur: 0.2
pH, UA: 6

## 2025-02-02 NOTE — Progress Notes (Signed)
 "  Name: Margaret Porter   Date of Visit: 02/02/25   Date of last visit with me: Visit date not found   CHIEF COMPLAINT:  Chief Complaint  Patient presents with   Annual Exam    Fasting annual exam. No new concerns. Offered vaccines but we don't have them due to weather, will either get at pharmacy or come back we have (flu and prevnar 20).        HPI:  Discussed the use of AI scribe software for clinical note transcription with the patient, who gave verbal consent to proceed.  History of Present Illness  Margaret Porter is a 55 year old female who presents for a well visit.  Patient Care Team: Nicolae Vasek, Alm RAMAN, PA-C as PCP - General (Family Medicine) Rutherford Gain, MD as Consulting Physician (Obstetrics and Gynecology) Whitfield Raisin, NP as Nurse Practitioner (Neurology) Terra Fairy PARAS, PA-C as Physician Assistant (Cardiology) Buck Saucer, MD as Attending Physician (Neurology) Prentiss Frieze, DO as Referring Physician (Family Medicine) Orlando Burnard JONELLE DEVONNA as Physician Assistant (Infectious Diseases) Philemon Cordella BIRCH, FNP as Nurse Practitioner (Infectious Diseases) Hays Morrison, CRNP as Nurse Practitioner (Nurse Practitioner)   She has a history of HIV, impaired fasting glucose, vitamin D  defiiciency, fatty liver disease. Her current medications include Dovato , Lasix as needed, metformin  XR 500 mg once daily, Toprol  XL 25 mg (half a tablet daily), and pitavastatin  4 mg daily. She is not consistent with taking vitamin D . Her last diabetes screen was borderline, and she had a viral load test in November. She sees infectious disease regularly for HIV management.  She reports multiple allergies including sulfa, aspirin, amoxicillin, Wellbutrin , oxycodone , mold, and red dye.  She had a mammogram last month which was normal, and a colonoscopy in March 2020 with a recommended follow-up in ten years. She completed a stool test for blood last year. Her last Pap smear  was in October.  She had an ultrasound of her heart and an EKG in 2024.  A fibroscan 11/2024 showed fatty liver disease with minimal scarring. She has had a hepatitis B vaccine in the past but is not currently immune.  She is not exercising as much as she should be.  No other aggravating or relieving factors. No other complaint.   Allergies[1]  Past Medical History:  Diagnosis Date   Allergic rhinitis    Allergy    Atrial flutter (HCC)    Follows w/ [facility]. Patient was taking Metoprolol  daily. Per patient, she was told by the A-fib clinic that she no longer needed to take daily if she was not having any palpitations.   CKD stage 3a, GFR 45-59 ml/min (HCC)    Follows w/ PCP @ [facility].   Dysrhythmia    Family history of adverse reaction to anesthesia    Patient states that her mother has difficulty waking up from anesthesia.   Food allergy    Hepatic steatosis    Follows w/ [facility]. Patient also w/ hepatic fibrosis, see 12/08/23 Fibroscan Liver Exam in Care Everywhere.   HIV infection (HCC) 2006   Follows w/ [facility] for Infectious disease.   Idiopathic urticaria    Joint pain    Lytic bone lesions on xray 2020   bone lesions on hip,sternum and spine, not related to a malignancy per pt   Mixed hyperlipidemia    Follows w/ PCP @ [name].   Obesity  Pre-diabetes    pt not on meds   Rosacea    Sickle cell trait    Sleep apnea    11/06/23 Home sleep study showed severe obstructive sleep apnea.   SVT (supraventricular tachycardia) 08/19/2023   Patient was having palpitations. EMS had concern for SVT. Two rounds of adenosine given. Patient had episode of asytole and syncope. Patient responded spontaneously. No CPR or ALS needed.   Swelling of both lower extremities    per pt, left side is more swollen   Vitamin D  deficiency    Wears glasses     Current Medications[2]  Family  History  Problem Relation Age of Onset   Heart disease Mother        cardiomyopathy related to covid infection   Diabetes Mother    Heart disease Father        CAD, considering pacemaker 2023   Diabetes Father    High blood pressure Father    Arrhythmia Father    Diabetes Brother    Heart disease Paternal Uncle    Heart disease Maternal Grandmother    Cancer Maternal Grandmother        breast   Heart disease Paternal Grandmother    Cancer Paternal Grandmother        breast   Colon cancer Neg Hx    Colon polyps Neg Hx    Esophageal cancer Neg Hx    Rectal cancer Neg Hx    Stomach cancer Neg Hx    Sleep apnea Neg Hx     Past Surgical History:  Procedure Laterality Date   COLONOSCOPY  03/09/2019   diverticulosis sigmoid, int hemorrhoids, repeat 10 years.  Dr. Gustav Mcgee   DILATATION & CURETTAGE/HYSTEROSCOPY WITH MYOSURE N/A 12/13/2023   Procedure: DILATATION & CURETTAGE/HYSTEROSCOPY WITH MYOSURE;  Surgeon: Rutherford Gain, MD;  Location: [facility];  Service: Gynecology;  Laterality: N/A;   DILATATION & CURRETTAGE/HYSTEROSCOPY WITH RESECTOCOPE N/A 06/27/2015   Procedure: DILATATION & CURETTAGE/HYSTEROSCOPY WITH RESECTOCOPE;  Surgeon: Gain Rutherford, MD;  Location: [facility];  Service: Gynecology;  Laterality: N/A;   DOPPLER ECHOCARDIOGRAPHY  09/05/2023   EF 60 - 65%   LAPAROSCOPIC APPENDECTOMY N/A 09/07/2017   Procedure: APPENDECTOMY LAPAROSCOPIC;  Surgeon: Signe Mitzie LABOR, MD;  Location: [facility];  Service: General;  Laterality: N/A;    Review of Systems  Constitutional:  Negative for chills, fever, malaise/fatigue and weight loss.  HENT:  Negative for congestion, ear pain, hearing loss, sore throat and tinnitus.   Eyes:  Negative for blurred vision, pain and redness.  Respiratory:  Negative for cough, hemoptysis and shortness of breath.   Cardiovascular:  Negative for chest pain, palpitations, orthopnea, claudication and leg swelling.   Gastrointestinal:  Negative for abdominal pain, blood in stool, constipation, diarrhea, nausea and vomiting.  Genitourinary:  Negative for dysuria, flank pain, frequency, hematuria and urgency.  Musculoskeletal:  Negative for falls, joint pain and myalgias.  Skin:  Negative for itching and rash.  Neurological:  Negative for dizziness, tingling, speech change, weakness and headaches.  Endo/Heme/Allergies:  Negative for polydipsia. Does not bruise/bleed easily.  Psychiatric/Behavioral:  Negative for depression and memory loss. The patient is not nervous/anxious and does not have insomnia.       OBJECTIVE:    BP 130/80   Pulse 80   Ht 5' 3 (1.6 m)   Wt 292 lb 6.4 oz (132.6 kg)   LMP  (LMP Unknown)   BMI 51.80 kg/m   BP Readings from Last 3  Encounters:  02/02/25 130/80  11/04/24 128/84  09/07/24 128/82    Wt Readings from Last 3 Encounters:  02/02/25 292 lb 6.4 oz (132.6 kg)  11/04/24 293 lb 10.4 oz (133.2 kg)  09/07/24 292 lb 3.2 oz (132.5 kg)    Physical Exam   General appearence: alert, no distress, WD/WN,  HEENT: normocephalic, sclerae anicteric, PERRLA, EOMi Neck: supple, no lymphadenopathy, no thyromegaly, no masses Heart: RRR, normal S1, S2, no murmurs Lungs: CTA bilaterally, no wheezes, rhonchi, or rales Abdomen: +bs, soft, non tender, non distended, no masses, no hepatomegaly, no splenomegaly Back: non tender Musculoskeletal: nontender, no swelling, no obvious deformity Extremities: no edema, no cyanosis, no clubbing Pulses: 2+ symmetric, upper and lower extremities, normal cap refill Neurological: alert, oriented x 3, CN2-12 intact, strength normal upper extremities and lower extremities, sensation normal throughout, DTRs 2+ throughout, no cerebellar signs, gait normal Psychiatric: normal affect, behavior normal, pleasant  Ext: no edema Breast/gyn - deferred to gyn   ASSESSMENT/PLAN:   Encounter Diagnoses  Name Primary?   Encounter for health  maintenance examination in adult Yes   Routine general medical examination at a health care facility    Vitamin D  deficiency    Impaired fasting blood sugar    Other hyperlipidemia    Human immunodeficiency virus (HIV) disease (HCC)    Insulin  resistance    Essential hypertension, benign     Today you had a preventative care visit or wellness visit.    Topics today may have included healthy lifestyle, diet, exercise, preventative care, vaccinations, sick and well care, proper use of emergency dept and after hours care, as well as other concerns.     Recommendations: Continue to return yearly for your annual wellness and preventative care visits.  This gives us  a chance to discuss healthy lifestyle, exercise, vaccinations, review your chart record, and perform screenings where appropriate.  I recommend you see your eye doctor yearly for routine vision care.  I recommend you see your dentist yearly for routine dental care including hygiene visits twice yearly.   Vaccination recommendations were reviewed  Adviesd flu, tdap, hepsilav B.   Due to recent snow/ice storm, all of our vaccines are in storage today elsewhere.  She can return in a week or so for vaccines.   Screening for cancer: Breast cancer screening: You should perform a self breast exam monthly.   We reviewed recommendations for regular mammograms and breast cancer screening.  Colon cancer screening:  I reviewed your colonoscopy on file that is up to date from 2020 and stool negative for blood 2025  Cervical cancer screening: We reviewed recommendations for pap smear screening. Up to date per gyn  Skin cancer screening: Check your skin regularly for new changes, growing lesions, or other lesions of concern Come in for evaluation if you have skin lesions of concern.  Lung cancer screening: If you have a greater than 30 pack year history of tobacco use, then you qualify for lung cancer screening with a chest CT  scan  We currently don't have screenings for other cancers besides breast, cervical, colon, and lung cancers.  If you have a strong family history of cancer or have other cancer screening concerns, please let me know.    Bone health: Get at least 150 minutes of aerobic exercise weekly Get weight bearing exercise at least once weekly Bone density test normal 2021   Heart health: Get at least 150 minutes of aerobic exercise weekly Limit alcohol It is important to maintain  a healthy blood pressure and healthy cholesterol numbers   Woman's Wellness Visit Routine wellness visit with emphasis on medication compliance and lifestyle modifications. - Encouraged regular exercise. - Discussed medication compliance, especially for daily medications like metformin .  Human immunodeficiency virus (HIV) disease HIV disease managed with Dovato . Recent viral load test in November with infectious disease. - Continue Dovato  as prescribed.  Fatty liver disease with hepatic fibrosis Fatty liver disease with minimal hepatic fibrosis. Discussed potential use of Wegovy  for fatty liver management, considering insurance coverage. - Ordered liver function tests. - Will consider Wegovy  for fatty liver management, pending insurance approval.  Impaired fasting glucose Previous borderline diabetes screening. Importance of monitoring and lifestyle modifications discussed. - Ordered diabetes marker A1c.  Other hyperlipidemia Hyperlipidemia managed with pitavastatin  (Livalo ) 4 mg daily. - Continue pitavastatin  (Livalo ) 4 mg daily.  Hypertension Managed with metoprolol  (Toprol  XL) 25 mg daily. - Continue metoprolol  (Toprol  XL) 25 m, 1/2 tablet daily.  Vitamin D  deficiency Non-compliance to supplementation. Importance of regular intake discussed. - Ordered vitamin D  level. - Encouraged regular vitamin D  supplementation.  Edema  -use lasix prn  Victorian was seen today for annual exam.  Diagnoses and  all orders for this visit:  Encounter for health maintenance examination in adult -     POCT Urinalysis DIP (Proadvantage Device) -     Hepatitis B surface antigen -     CBC with Differential/Platelet -     Comprehensive metabolic panel with GFR -     Lipid panel -     TSH -     Hemoglobin A1c -     VITAMIN D  25 Hydroxy (Vit-D Deficiency, Fractures) -     Vitamin B12 -     Insulin , random  Routine general medical examination at a health care facility  Vitamin D  deficiency -     VITAMIN D  25 Hydroxy (Vit-D Deficiency, Fractures) -     Vitamin B12  Impaired fasting blood sugar -     Hemoglobin A1c  Other hyperlipidemia -     Lipid panel  Human immunodeficiency virus (HIV) disease (HCC) -     Hepatitis B surface antigen  Insulin  resistance -     Insulin , random  Essential hypertension, benign   Follow-up pending labs, yearly for physical         [facility]    [1]  Allergies Allergen Reactions   Molds & Smuts     SOB, chest congestion   Oxycodone      Questionable mouth itching, sweats, but was also simultaneously on Cipro , Flagyl , and Robaxin .   Wellbutrin  [Bupropion ] Other (See Comments)    Eyes swelled 3 hours after taking one dose   Red Dye #40 (Allura Red)     Caused a rash with large amounts   Amoxicillin Rash   Aspirin Anxiety   Sulfamethoxazole-Trimethoprim Rash  [2]  Current Outpatient Medications:    Cholecalciferol (VITAMIN D ) 50 MCG (2000 UT) tablet, Take 8,000 Units by mouth daily., Disp: , Rfl:    diphenhydrAMINE  (BENADRYL ) 25 MG tablet, Take 25 mg by mouth every 6 (six) hours as needed for itching or allergies. Reported on 02/29/2016, Disp: , Rfl:    dolutegravir -lamiVUDine  (DOVATO ) 50-300 MG tablet, Take 1 tablet by mouth daily., Disp: 30 tablet, Rfl: 3   furosemide (LASIX) 40 MG tablet, Take 40 mg by mouth daily as needed., Disp: , Rfl:    metFORMIN  (GLUCOPHAGE -XR) 500 MG 24 hr tablet, Take 500 mg  by mouth  every evening., Disp: , Rfl:    metoprolol  succinate (TOPROL  XL) 25 MG 24 hr tablet, Take 0.5 tablets (12.5 mg total) by mouth at bedtime., Disp: , Rfl:    Pitavastatin  Calcium  4 MG TABS, TAKE 1 TABLET BY MOUTH EVERY DAY, Disp: 90 tablet, Rfl: 1  "

## 2025-02-03 ENCOUNTER — Other Ambulatory Visit: Payer: Self-pay | Admitting: Medical

## 2025-02-03 ENCOUNTER — Ambulatory Visit: Payer: Self-pay | Admitting: Medical

## 2025-02-03 LAB — VITAMIN B12: Vitamin B-12: 303 pg/mL (ref 232–1245)

## 2025-02-03 LAB — COMPREHENSIVE METABOLIC PANEL WITH GFR
ALT: 14 [IU]/L (ref 0–32)
AST: 19 [IU]/L (ref 0–40)
Albumin: 4.2 g/dL (ref 3.8–4.9)
Alkaline Phosphatase: 138 [IU]/L — ABNORMAL HIGH (ref 49–135)
BUN/Creatinine Ratio: 11 (ref 9–23)
BUN: 12 mg/dL (ref 6–24)
Bilirubin Total: 0.4 mg/dL (ref 0.0–1.2)
CO2: 21 mmol/L (ref 20–29)
Calcium: 9.4 mg/dL (ref 8.7–10.2)
Chloride: 105 mmol/L (ref 96–106)
Creatinine, Ser: 1.05 mg/dL — ABNORMAL HIGH (ref 0.57–1.00)
Globulin, Total: 2.1 g/dL (ref 1.5–4.5)
Glucose: 119 mg/dL — ABNORMAL HIGH (ref 70–99)
Potassium: 4.5 mmol/L (ref 3.5–5.2)
Sodium: 140 mmol/L (ref 134–144)
Total Protein: 6.3 g/dL (ref 6.0–8.5)
eGFR: 62 mL/min/{1.73_m2}

## 2025-02-03 LAB — CBC WITH DIFFERENTIAL/PLATELET
Basophils Absolute: 0 10*3/uL (ref 0.0–0.2)
Basos: 0 %
EOS (ABSOLUTE): 0 10*3/uL (ref 0.0–0.4)
Eos: 1 %
Hematocrit: 45.1 % (ref 34.0–46.6)
Hemoglobin: 15.2 g/dL (ref 11.1–15.9)
Immature Grans (Abs): 0 10*3/uL (ref 0.0–0.1)
Immature Granulocytes: 0 %
Lymphocytes Absolute: 1.7 10*3/uL (ref 0.7–3.1)
Lymphs: 29 %
MCH: 32.9 pg (ref 26.6–33.0)
MCHC: 33.7 g/dL (ref 31.5–35.7)
MCV: 98 fL — ABNORMAL HIGH (ref 79–97)
Monocytes Absolute: 0.4 10*3/uL (ref 0.1–0.9)
Monocytes: 7 %
Neutrophils Absolute: 3.7 10*3/uL (ref 1.4–7.0)
Neutrophils: 63 %
Platelets: 235 10*3/uL (ref 150–450)
RBC: 4.62 x10E6/uL (ref 3.77–5.28)
RDW: 12.7 % (ref 11.7–15.4)
WBC: 5.9 10*3/uL (ref 3.4–10.8)

## 2025-02-03 LAB — HEMOGLOBIN A1C
Est. average glucose Bld gHb Est-mCnc: 123 mg/dL
Hgb A1c MFr Bld: 5.9 % — ABNORMAL HIGH (ref 4.8–5.6)

## 2025-02-03 LAB — LIPID PANEL
Chol/HDL Ratio: 2.8 ratio (ref 0.0–4.4)
Cholesterol, Total: 106 mg/dL (ref 100–199)
HDL: 38 mg/dL — ABNORMAL LOW
LDL Chol Calc (NIH): 50 mg/dL (ref 0–99)
Triglycerides: 90 mg/dL (ref 0–149)
VLDL Cholesterol Cal: 18 mg/dL (ref 5–40)

## 2025-02-03 LAB — VITAMIN D 25 HYDROXY (VIT D DEFICIENCY, FRACTURES): Vit D, 25-Hydroxy: 26.2 ng/mL — AB (ref 30.0–100.0)

## 2025-02-03 LAB — INSULIN, RANDOM: INSULIN: 61.5 u[IU]/mL — AB (ref 2.6–24.9)

## 2025-02-03 LAB — HEPATITIS B SURFACE ANTIGEN

## 2025-02-03 LAB — TSH: TSH: 3.46 u[IU]/mL (ref 0.450–4.500)

## 2025-02-03 NOTE — Progress Notes (Signed)
 Results through MyChart,

## 2025-02-03 NOTE — Progress Notes (Signed)
 Results through MyChart

## 2025-02-04 ENCOUNTER — Other Ambulatory Visit (HOSPITAL_COMMUNITY)
Admission: RE | Admit: 2025-02-04 | Discharge: 2025-02-04 | Disposition: A | Source: Ambulatory Visit | Attending: Family | Admitting: Family

## 2025-02-04 ENCOUNTER — Other Ambulatory Visit

## 2025-02-04 ENCOUNTER — Other Ambulatory Visit: Payer: Self-pay

## 2025-02-04 DIAGNOSIS — B20 Human immunodeficiency virus [HIV] disease: Secondary | ICD-10-CM

## 2025-02-04 DIAGNOSIS — Z113 Encounter for screening for infections with a predominantly sexual mode of transmission: Secondary | ICD-10-CM

## 2025-02-05 ENCOUNTER — Other Ambulatory Visit

## 2025-02-05 LAB — T-HELPER CELL (CD4) - (RCID CLINIC ONLY)
CD4 % Helper T Cell: 46 % (ref 33–65)
CD4 T Cell Abs: 726 /uL (ref 400–1790)

## 2025-02-05 LAB — URINE CYTOLOGY ANCILLARY ONLY
Chlamydia: NEGATIVE
Comment: NEGATIVE
Comment: NORMAL
Neisseria Gonorrhea: NEGATIVE

## 2025-02-05 LAB — SYPHILIS: RPR W/REFLEX TO RPR TITER AND TREPONEMAL ANTIBODIES, TRADITIONAL SCREENING AND DIAGNOSIS ALGORITHM: RPR Ser Ql: NONREACTIVE

## 2025-02-15 ENCOUNTER — Encounter

## 2025-02-19 ENCOUNTER — Encounter: Payer: Self-pay | Admitting: Family

## 2025-02-22 ENCOUNTER — Encounter: Admitting: Family

## 2025-07-15 ENCOUNTER — Telehealth: Admitting: Adult Health

## 2026-02-02 ENCOUNTER — Encounter: Admitting: Medical
